# Patient Record
Sex: Female | Born: 1935
Health system: Southern US, Community
[De-identification: ages and names within clinical notes are randomized; demographics above are authoritative.]

## PROBLEM LIST (undated history)

## (undated) DIAGNOSIS — G4733 Obstructive sleep apnea (adult) (pediatric): Secondary | ICD-10-CM

## (undated) DIAGNOSIS — C801 Malignant (primary) neoplasm, unspecified: Secondary | ICD-10-CM

## (undated) DIAGNOSIS — I471 Supraventricular tachycardia, unspecified: Secondary | ICD-10-CM

## (undated) DIAGNOSIS — R0600 Dyspnea, unspecified: Secondary | ICD-10-CM

## (undated) DIAGNOSIS — I719 Aortic aneurysm of unspecified site, without rupture: Secondary | ICD-10-CM

## (undated) DIAGNOSIS — I499 Cardiac arrhythmia, unspecified: Secondary | ICD-10-CM

## (undated) DIAGNOSIS — J8410 Pulmonary fibrosis, unspecified: Secondary | ICD-10-CM

## (undated) DIAGNOSIS — E039 Hypothyroidism, unspecified: Secondary | ICD-10-CM

## (undated) DIAGNOSIS — E079 Disorder of thyroid, unspecified: Secondary | ICD-10-CM

## (undated) DIAGNOSIS — L409 Psoriasis, unspecified: Secondary | ICD-10-CM

## (undated) HISTORY — PX: APPENDECTOMY: SHX54

## (undated) HISTORY — PX: TOTAL ABDOMINAL HYSTERECTOMY: SHX209

## (undated) HISTORY — DX: Dyspnea, unspecified: R06.00

## (undated) HISTORY — DX: Supraventricular tachycardia: I47.1

## (undated) HISTORY — DX: Obstructive sleep apnea (adult) (pediatric): G47.33

## (undated) HISTORY — DX: Cardiac arrhythmia, unspecified: I49.9

## (undated) HISTORY — PX: CRANIOTOMY: SHX93

## (undated) HISTORY — PX: ABDOMINAL HYSTERECTOMY: SHX81

## (undated) HISTORY — PX: CHOLECYSTECTOMY: SHX55

## (undated) HISTORY — DX: Psoriasis, unspecified: L40.9

## (undated) HISTORY — DX: Hypothyroidism, unspecified: E03.9

## (undated) HISTORY — DX: Supraventricular tachycardia, unspecified: I47.10

## (undated) HISTORY — PX: HERNIA REPAIR: SHX51

## (undated) HISTORY — PX: TUBAL LIGATION: SHX77

---

## 1999-06-11 ENCOUNTER — Encounter: Payer: Self-pay | Admitting: Urology

## 1999-06-11 ENCOUNTER — Encounter: Admission: RE | Admit: 1999-06-11 | Discharge: 1999-06-11 | Payer: Self-pay | Admitting: Urology

## 1999-06-14 ENCOUNTER — Other Ambulatory Visit: Admission: RE | Admit: 1999-06-14 | Discharge: 1999-06-14 | Payer: Self-pay | Admitting: *Deleted

## 1999-06-18 ENCOUNTER — Encounter: Payer: Self-pay | Admitting: Urology

## 1999-06-18 ENCOUNTER — Encounter: Admission: RE | Admit: 1999-06-18 | Discharge: 1999-06-18 | Payer: Self-pay | Admitting: Urology

## 1999-08-30 ENCOUNTER — Encounter: Payer: Self-pay | Admitting: *Deleted

## 1999-09-04 ENCOUNTER — Inpatient Hospital Stay (HOSPITAL_COMMUNITY): Admission: RE | Admit: 1999-09-04 | Discharge: 1999-09-08 | Payer: Self-pay | Admitting: *Deleted

## 1999-09-04 ENCOUNTER — Encounter (INDEPENDENT_AMBULATORY_CARE_PROVIDER_SITE_OTHER): Payer: Self-pay

## 1999-09-07 ENCOUNTER — Encounter: Payer: Self-pay | Admitting: *Deleted

## 2002-02-01 ENCOUNTER — Encounter: Payer: Self-pay | Admitting: Internal Medicine

## 2002-02-01 ENCOUNTER — Encounter: Admission: RE | Admit: 2002-02-01 | Discharge: 2002-02-01 | Payer: Self-pay | Admitting: Internal Medicine

## 2003-06-29 ENCOUNTER — Encounter: Admission: RE | Admit: 2003-06-29 | Discharge: 2003-06-29 | Payer: Self-pay | Admitting: Internal Medicine

## 2003-10-24 ENCOUNTER — Ambulatory Visit (HOSPITAL_COMMUNITY): Admission: RE | Admit: 2003-10-24 | Discharge: 2003-10-24 | Payer: Self-pay | Admitting: Internal Medicine

## 2003-11-01 ENCOUNTER — Encounter: Admission: RE | Admit: 2003-11-01 | Discharge: 2003-11-01 | Payer: Self-pay | Admitting: Internal Medicine

## 2005-02-25 ENCOUNTER — Encounter: Admission: RE | Admit: 2005-02-25 | Discharge: 2005-02-25 | Payer: Self-pay | Admitting: Internal Medicine

## 2005-03-30 ENCOUNTER — Emergency Department (HOSPITAL_COMMUNITY): Admission: EM | Admit: 2005-03-30 | Discharge: 2005-03-30 | Payer: Self-pay | Admitting: Emergency Medicine

## 2006-08-15 ENCOUNTER — Inpatient Hospital Stay (HOSPITAL_COMMUNITY): Admission: RE | Admit: 2006-08-15 | Discharge: 2006-08-19 | Payer: Self-pay | Admitting: Surgery

## 2006-09-02 ENCOUNTER — Encounter: Admission: RE | Admit: 2006-09-02 | Discharge: 2006-09-02 | Payer: Self-pay | Admitting: Internal Medicine

## 2007-01-23 ENCOUNTER — Encounter: Admission: RE | Admit: 2007-01-23 | Discharge: 2007-01-23 | Payer: Self-pay | Admitting: Specialist

## 2007-06-26 ENCOUNTER — Ambulatory Visit: Payer: Self-pay | Admitting: Pulmonary Disease

## 2007-06-26 DIAGNOSIS — I499 Cardiac arrhythmia, unspecified: Secondary | ICD-10-CM | POA: Insufficient documentation

## 2007-06-26 DIAGNOSIS — G4733 Obstructive sleep apnea (adult) (pediatric): Secondary | ICD-10-CM | POA: Insufficient documentation

## 2007-06-26 DIAGNOSIS — G4730 Sleep apnea, unspecified: Secondary | ICD-10-CM | POA: Insufficient documentation

## 2007-07-01 DIAGNOSIS — R0602 Shortness of breath: Secondary | ICD-10-CM | POA: Insufficient documentation

## 2007-07-01 DIAGNOSIS — R0902 Hypoxemia: Secondary | ICD-10-CM | POA: Insufficient documentation

## 2007-07-03 ENCOUNTER — Ambulatory Visit: Payer: Self-pay | Admitting: Pulmonary Disease

## 2007-07-09 ENCOUNTER — Ambulatory Visit: Payer: Self-pay | Admitting: Cardiology

## 2007-07-13 ENCOUNTER — Ambulatory Visit: Payer: Self-pay | Admitting: Pulmonary Disease

## 2007-07-13 LAB — CONVERTED CEMR LAB
Rhuematoid fact SerPl-aCnc: <20 intl units/mL — ABNORMAL LOW (ref 0.0–20.0)
Sed Rate: 16 mm/hr (ref 0–22)

## 2007-08-11 ENCOUNTER — Emergency Department (HOSPITAL_COMMUNITY): Admission: EM | Admit: 2007-08-11 | Discharge: 2007-08-11 | Payer: Self-pay | Admitting: Emergency Medicine

## 2007-08-21 ENCOUNTER — Ambulatory Visit: Payer: Self-pay | Admitting: Pulmonary Disease

## 2007-08-21 DIAGNOSIS — J849 Interstitial pulmonary disease, unspecified: Secondary | ICD-10-CM

## 2007-10-27 ENCOUNTER — Encounter: Admission: RE | Admit: 2007-10-27 | Discharge: 2007-10-27 | Payer: Self-pay | Admitting: Internal Medicine

## 2007-11-25 ENCOUNTER — Encounter: Payer: Self-pay | Admitting: Pulmonary Disease

## 2008-02-22 ENCOUNTER — Encounter: Payer: Self-pay | Admitting: Pulmonary Disease

## 2008-02-22 ENCOUNTER — Ambulatory Visit: Payer: Self-pay | Admitting: Pulmonary Disease

## 2008-02-22 DIAGNOSIS — E038 Other specified hypothyroidism: Secondary | ICD-10-CM

## 2008-08-22 ENCOUNTER — Ambulatory Visit: Payer: Self-pay | Admitting: Pulmonary Disease

## 2008-08-25 ENCOUNTER — Ambulatory Visit: Payer: Self-pay | Admitting: Pulmonary Disease

## 2008-09-09 ENCOUNTER — Telehealth (INDEPENDENT_AMBULATORY_CARE_PROVIDER_SITE_OTHER): Payer: Self-pay | Admitting: *Deleted

## 2008-12-16 ENCOUNTER — Encounter: Admission: RE | Admit: 2008-12-16 | Discharge: 2008-12-16 | Payer: Self-pay | Admitting: Internal Medicine

## 2009-02-20 ENCOUNTER — Ambulatory Visit: Payer: Self-pay | Admitting: Pulmonary Disease

## 2009-02-21 ENCOUNTER — Encounter: Payer: Self-pay | Admitting: Pulmonary Disease

## 2009-09-07 ENCOUNTER — Ambulatory Visit: Payer: Self-pay | Admitting: Pulmonary Disease

## 2010-03-06 ENCOUNTER — Ambulatory Visit: Payer: Self-pay | Admitting: Pulmonary Disease

## 2010-05-08 NOTE — Assessment & Plan Note (Signed)
Summary: rov for ISLD   Visit Type:  Follow-up Copy to:  [facility] Primary Nickolus Wadding/Referring Kanen Mottola:  Kellie Shropshire  CC:  6 month f/u. pt states her breathing has been doing "oaky". pt c/o occas cough w/ yellow phlem and sore throat when wakes up in the am. pt quit smoking in 2008. Marland Kitchen  History of Present Illness: the pt comes in today for f/u of her known ISLD, felt to be due to PF vs RBILD.  She has continued to stay away from smoking, and feels that she is maintianing a consistent baseline.  She has minimal cough with occasional mucus, and states that her exertional tolerance is stable.  She is using combivent as needed.  Current Medications (verified): 1)  Ambien 10 Mg  Tabs (Zolpidem Tartrate) .... Take 1 Tab By Mouth At Bedtime 2)  Cartia Xt 180 Mg  Cp24 (Diltiazem Hcl Coated Beads) .... Take 1 Tablet By Mouth Once A Day 3)  Aspirin 325 Mg Tabs (Aspirin) .... Once Daily 4)  Combivent 103-18 Mcg/act  Aero (Ipratropium-Albuterol) .... Use As Needed 5)  Hydrochlorothiazide 25 Mg Tabs (Hydrochlorothiazide) .... Take 1 Tablet By Mouth Once A Day 6)  Synthroid .... Take 1 Tablet By Mouth Once A Day 7)  Janumet 50-500 Mg Tabs (Sitagliptin-Metformin Hcl) .... Once Daily 8)  Flexeril 10 Mg Tabs (Cyclobenzaprine Hcl) .... Every 8 Hours Prn 9)  Vicodin 5-500 Mg Tabs (Hydrocodone-Acetaminophen) .... 1/2 Tab Q6h Prn 10)  Tylenol Extra Strength 500 Mg Tabs (Acetaminophen) .... Take 2 Tabs By Mouth As Needed 11)  Klor-Con M20 20 Meq Cr-Tabs (Potassium Chloride Crys Cr) .... Take 1 Tablet By Mouth Once A Day 12)  Oxygen .... 2 Lpm At Bedtime 13)  Simvastatin (? Strength) .... Once Daily  Allergies (verified): 1)  ! Morphine 2)  ! Codeine 3)  ! Darvocet 4)  ! Tylox 5)  ! Percocet 6)  ! * Dilaudid  Review of Systems       The patient complains of shortness of breath with activity, productive cough, acid heartburn, indigestion, loss of appetite, difficulty swallowing, sore throat, nasal  congestion/difficulty breathing through nose, anxiety, depression, hand/feet swelling, joint stiffness or pain, and change in color of mucus.  The patient denies shortness of breath at rest, non-productive cough, coughing up blood, chest pain, irregular heartbeats, weight change, abdominal pain, tooth/dental problems, headaches, sneezing, itching, ear ache, rash, and fever.    Vital Signs:  Patient profile:   75 year old female Height:      61 inches Weight:      177.50 pounds BMI:     33.66 O2 Sat:      93 % on Room air Temp:     97.9 degrees F oral Pulse rate:   70 / minute BP sitting:   100 / 62  (left arm) Cuff size:   large  Vitals Entered By: Carver Fila (March 06, 2010 11:53 AM)  O2 Flow:  Room air CC: 6 month f/u. pt states her breathing has been doing "oaky". pt c/o occas cough w/ yellow phlem, sore throat when wakes up in the am. pt quit smoking in 2008.  Comments meds and allergies updated Phone number updated Carver Fila  March 06, 2010 11:58 AM    Physical Exam  General:  obese female in nad Lungs:  minimal basilar crackles, no wheezing or rhonchi Heart:  rrr, no mrg Extremities:  mild LE edema, no cyanosis  Neurologic:  alert and oriented, moves all 4.  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  She has stable cxr and pfts, and has quit smoking.  At this point, would like to follow her clinically, and will check yearly pfts/cxr.  The pt will let me know if she has any escalation of breathing issues.    Medications Added to Medication List This Visit: 1)  Aspirin 325 Mg Tabs (Aspirin) .... Once daily 2)  Janumet 50-500 Mg Tabs (Sitagliptin-metformin hcl) .... Once daily 3)  Simvastatin (? Strength)  .... Once daily  Other Orders: Est. Patient Level III (16109) Pulmonary Referral (Pulmonary)  Patient Instructions: 1)  will see  back in 6mos and check cxr and breathing studies before visit. 2)  work on weight loss and conditioning program   Immunization History:  Influenza Immunization History:    Influenza:  historical (11/06/2009)

## 2010-05-08 NOTE — Assessment & Plan Note (Signed)
Summary: rov for isld   Primary Provider/Referring Provider:  Kellie Shropshire  CC:  6 month follow up with CXR and PFTs.  Pt states she occ has trouble breathing when the humidity is high or when it is hot but states breathing is doing well overall.  States she is walking and working out at [employer] seems to be helping also.  States she does have an occ cough.  States it is occ prod with thick light brown mucus.  Denies wheezing and chest tightness.  Marland Kitchen  History of Present Illness: the pt comes in today for f/u of her known interstitial lung disease secondary to either pulmonary fibrosis vs RBILD.  She continues to not smoke, and is tryng to participate in an exercise and weight loss program currently.  She feels that her exertional tolerance is at her usual baseline, and has no signficant cough or mucus.  Her pfts show improvement in TLC and no change in DLCO.  She does not have any significant obstructive disease.  Her cxr shows no change in her known ISLD.  I have reviewed the various tests in detail with her, and answered all of her questions.  Current Medications (verified): 1)  Ambien 10 Mg  Tabs (Zolpidem Tartrate) .... Take 1 Tab By Mouth At Bedtime 2)  Cartia Xt 180 Mg  Cp24 (Diltiazem Hcl Coated Beads) .... Take 1 Tablet By Mouth Once A Day 3)  Low-Dose Aspirin 81 Mg  Tabs (Aspirin) .... Take 1 Tablet By Mouth Once A Day 4)  Combivent 103-18 Mcg/act  Aero (Ipratropium-Albuterol) .... Use As Needed 5)  Hydrochlorothiazide 25 Mg Tabs (Hydrochlorothiazide) .... Take 1 Tablet By Mouth Once A Day 6)  Synthroid .... Take 1 Tablet By Mouth Once A Day 7)  Janumet .... Take 1 Tablet By Mouth Two Times A Day 8)  Flexeril 10 Mg Tabs (Cyclobenzaprine Hcl) .... Every 8 Hours Prn 9)  Vicodin 5-500 Mg Tabs (Hydrocodone-Acetaminophen) .... 1/2 Tab Q6h Prn 10)  Tylenol Extra Strength 500 Mg Tabs (Acetaminophen) .... Take 2 Tabs By Mouth As Needed 11)  Klor-Con M20 20 Meq Cr-Tabs (Potassium Chloride Crys  Cr) .... Take 1 Tablet By Mouth Once A Day 12)  Oxygen .... 2 Lpm At Bedtime  Allergies (verified): 1)  ! Morphine 2)  ! Codeine 3)  ! Darvocet 4)  ! Tylox 5)  ! Percocet 6)  ! * Dilaudid  Review of Systems       The patient complains of shortness of breath with activity, productive cough, non-productive cough, and hand/feet swelling.  The patient denies shortness of breath at rest, coughing up blood, chest pain, irregular heartbeats, acid heartburn, indigestion, loss of appetite, weight change, abdominal pain, difficulty swallowing, sore throat, tooth/dental problems, headaches, nasal congestion/difficulty breathing through nose, sneezing, itching, ear ache, anxiety, depression, joint stiffness or pain, rash, change in color of mucus, and fever.    Vital Signs:  Patient profile:   75 year old female Height:      61 inches Weight:      174 pounds BMI:     33.00 O2 Sat:      92 % on Room air Temp:     97.9 degrees F oral Pulse rate:   85 / minute BP sitting:   108 / 54  (left arm) Cuff size:   regular  Vitals Entered By: Gweneth Dimitri RN (September 07, 2009 1:48 PM)  O2 Flow:  Room air CC: 6 month follow up with CXR  and PFTs.  Pt states she occ has trouble breathing when the humidity is high or when it is hot but states breathing is doing well overall.  States she is walking and working out at [employer] seems to be helping also.  States she does have an occ cough.  States it is occ prod with thick light brown mucus.  Denies wheezing and chest tightness.   Comments Medications reviewed with patient Daytime contact number verified with patient. Gweneth Dimitri RN  September 07, 2009 1:49 PM    Physical Exam  General:  obese female in nad Lungs:  basilar crackles, no wheezing or rhonchi Heart:  rrr, no mrg Extremities:  no signficant edema or cyanosis Neurologic:  alert and oriented, moves all 4.   Impression & Recommendations:  Problem # 1:  FIBROSIS, POSTINFLAMMATORY PULMONARY  (ICD-515) the pt has ISLD/PF of unknown origin.  This may simply be RBILD related to her past smoking, but at least she has now quit.  Her cxr and PFTs remain stable, and that is a good sign.  Weight loss and conditioning are the main goals currently, and she will followup with me consistently for monitoring of her cxr and pfts.  Medications Added to Medication List This Visit: 1)  Hydrochlorothiazide 25 Mg Tabs (Hydrochlorothiazide) .... Take 1 tablet by mouth once a day  Other Orders: Est. Patient Level III (45409)  Patient Instructions: 1)  keep working on weight loss and conditioning program 2)  followup with me in 6mos or sooner if having issues.

## 2010-05-08 NOTE — Miscellaneous (Signed)
Summary: Orders Update-pft charges//jwr  Clinical Lists Changes  Orders: Added new Service order of Carbon Monoxide diffusing w/capacity (94720) - Signed Added new Service order of Lung Volumes (94240) - Signed Added new Service order of Spirometry (Pre & Post) (94060) - Signed 

## 2010-08-21 NOTE — Discharge Summary (Signed)
[name], [name]                 ACCOUNT NO.:  [id number]   MEDICAL RECORD NO.:  [id number]          PATIENT TYPE:  INP   LOCATION:  5703                         FACILITY:  [facility]   PHYSICIAN:  Sandria Bales. Ezzard Standing, M.D.  DATE OF BIRTH:  08-31-1935   DATE OF ADMISSION:  08/15/2006  DATE OF DISCHARGE:  08/19/2006                               DISCHARGE SUMMARY   DISCHARGE DIAGNOSES:  1. Multiple ventral incisional hernias.  2. Right upper lobe pneumonia.  3. Chronic mild hypoxia secondary to chronic obstructive pulmonary      disease. (patient sent home home O2)  4. Chronic obstructive pulmonary disease.  5. History of paroxysmal supraventricular tachycardias possibly      related to her chronic hypoxia.  6. History of smoking cigarettes, she quit two weeks preop.  7. Hypercholesterolemia.  8. Hypertension.   OPERATION PERFORMED:  The patient underwent a laparoscopic enterolysis  of adhesions with laparoscopic ventral hernia repair with Parietex mesh  on 15 Aug 2006.   HISTORY OF PRESENT ILLNESS:  Dawn Gomez is a 75 year old white female who  is a patient of Dawn Gomez who has had multiple prior abdominal  operations.  She had an appendectomy with a right lower quadrant  incision 1962.  She had an open cholecystectomy with an upper midline  incision by Dawn Gomez in 1989.  She had a oophorectomy and later  hysterectomy by Dawn Gomez in 2001 and Dawn Gomez  tried to repair an abdominal hernia at that time.  The patient was under  the impression she had mesh repair, but by my review of the chart, I do  not think he ever used mesh because he was worried about contamination.   She subsequently developed increasingly symptomatic abdominal wall  hernias and comes for repair of these hernias.   SOCIAL HISTORY:  She smokes cigarettes, a pack or more a day.  She quit  about 2 weeks preoperatively.   CARDIAC:  She has had histories of tachy dysrhythmias.  Has been  placed  on beta blocker by Dawn Gomez.  Had cardiac clearance by him seen the  16 July 2006.   She had a left frontal craniotomy by Dawn Gomez, 1990 for clipping 2  intracerebral aneurysms.   PHYSICAL EXAMINATION:  She had multiple abdominal wall hernias on  palpation.   HOSPITAL COURSE:  She was taken to the operating room on the day of  admission.  She underwent a laparoscopic repair of a ventral incisional  hernia.  I spent 2 hours doing enterolysis of adhesions.  She had  multiple abdominal defects, which were repaired with a single piece of  Parietex mesh.   Postoperatively her biggest problem was that of poor saturations.  Off  oxygen she would run sats in the mid to low 80s.  I did get a chest x-  ray that showed a small infiltrate in her right lobe of her lung.  She  has had a little bit of hemoptysis possibly due to this infiltrate, but  she has never been febrile.  She has  never had an elevated white blood  count.  She has had no other signs of infection.   She was put empirically on [place] on the 11 May.  She is now 4 days  postop on the 13 May.  She has been on [place] for 2 days.  She does a  good job with incentive spirometry of over 1,200 mL, but despite what to  her appears to be breathing normal, her sats are running in the mid 80s.   I think she has undiagnosed chronic hypoxemia possibly secondary to COPD  and her cigarettes smoking.  There is some hope that as she gets further  out from cigarette smoking this will improve.  I spoke to Dawn Gomez  on the phone regarding this finding.   She is now ready for discharge.   DISCHARGE MEDICATIONS:  Her discharge medicine will be resuming home  medicine, which include:  1. Benadryl p.r.n. for itching allergies.  2. Vytorin 10/40 nightly.  3. Cartia XT 180 daily.  4. Metoprolol 25 mg daily.  5. Aspirin 81 mg.  6. Tylenol extra strength.  7. Zantac.   PLAN:  I have sent her home on Vicodin for pain.  Gave  her Augmentin for  5 days of antibiotics for a total of 7 days of antibiotics for her lung  infiltrate and we will arrange for her to have home O2.   FOLLOWUP:  She will see me back in 2 weeks.  She is to make arrangements  to see Dawn Gomez as far as followup from her pulmonary standpoint.      Sandria Bales. Ezzard Standing, M.D.  Electronically Signed     DHN/MEDQ  D:  08/19/2006  T:  08/19/2006  Job:  [id number]   cc:   Olene Craven, M.D.  Cristy Hilts. Jacinto Halim, MD

## 2010-08-24 NOTE — Op Note (Signed)
[facility]  Patient:    Dawn Gomez, Dawn Gomez                      MRN: [id number] Proc. Date: 09/04/99 Adm. Date:  [id number] Attending:  Donne Hazel CCWilley Blade, M.D.             Milus Mallick, M.D.             Dr. Adelina Mings                           Operative Report  PREOPERATIVE DIAGNOSIS:  Microhematuria, rule out interstitial cystitis.  POSTOPERATIVE DIAGNOSIS:  Microhematuria.  PROCEDURE:  Cystoscopy and bladder biopsy.  SURGEON:  Lindaann Slough, M.D.  ANESTHESIA:  General.  INDICATIONS:  The patient is a 75 year old female who was found on urinalysis to have microhematuria.  She has also been complaining of suprapubic pain, frequency, and dysuria.  She was treated with antibiotics without any improvement.  An IVP showed normal upper tracts.  The patient also has uterine fibroids and a ventral hernia.  She is scheduled for a hysterectomy by Dr. Malachy Mood and a ventral hernia repair by Dr. Orpah Greek.  She is now scheduled for cystoscopy.  DESCRIPTION OF PROCEDURE:  Under general endotracheal anesthesia, the patient was prepped and draped and placed in the dorsal lithotomy position.  A #22 Wappler cystoscope was inserted in the bladder.  The bladder mucosa is normal. There is no stone or tumor in the bladder.  The ureteral orifices are in normal position and shape, with clear efflux.  There was no evidence of submucosal hemorrhage.  Then a random bladder biopsy was done.  Areas of biopsy were then fulgurated with the Bugbee electrode.  There was no evidence of bleeding at the end of the procedure.  The bladder was then distended with 400 cc of fluid for 10 minutes.  The cystoscope was then removed.  A #16 Foley catheter was then inserted in the bladder.  The patient tolerated the procedure well and left the OR in satisfactory condition to the postanesthesia care unit. DD:  09/04/99 TD:  09/06/99 Job: [id number]

## 2010-08-24 NOTE — Op Note (Signed)
NAMESHERETTA, GRUMBINE                 ACCOUNT NO.:  [id number]   MEDICAL RECORD NO.:  [id number]          PATIENT TYPE:  INP   LOCATION:  2899                         FACILITY:  [facility]   PHYSICIAN:  Sandria Bales. Ezzard Standing, M.D.  DATE OF BIRTH:  11-13-1935   DATE OF PROCEDURE:  08/15/2006  DATE OF DISCHARGE:                               OPERATIVE REPORT   PREOPERATIVE DIAGNOSIS:  Multiple abdominal wall ventral incisional  herniae.   POSTOPERATIVE DIAGNOSES:  Swiss cheese ventral incisional hernia  covering a 12 x 25-cm area (at least 10 defects in the fascia)   PROCEDURES:  Enterolysis of adhesion (spending over 2 hours), and  laparoscopic ventral hernia repair with 30 x 25-cm Parietex mesh.   SURGEON:  Sandria Bales. Ezzard Standing, M.D.   FIRST ASSISTANT:  No first assistant.   ANESTHESIA:  General endotracheal.   ESTIMATED BLOOD LOSS:  Minimal.   INDICATIONS FOR PROCEDURES:  Miss Joerger is a 75 year old white female, a  patient of Dr. Garner Nash, who has had multiple abdominal operations,  including an open cholecystectomy, an oophorectomy and lap-assisted  hysterectomy, a prior abdominal hernia repair by Dr. Orpah Greek without  mesh.   She now comes for attempted laparoscopic ventral hernia repair.  The  indications and potential complications were explained to the patient.  The potential complications include but are not limited to bleeding,  infection, bowel injury, the possibility that I cannot do this  laparoscopically and requiring open surgery, and the possibility of mesh  infection.   OPERATIVE NOTE:  The patient was placed in a supine position and given a  general endotracheal anesthetic.  She had a Foley catheter in place and  was given 1 g of Ancef at the initiation of the procedure.  Her abdomen  was prepped with Betadine solution and sterilely draped, and I put an  Ioban drape over her abdominal wall.  I accessed her abdomen through the  left upper quadrant using an 10 mm Ethicon  Opti-Vu trocar and got into  the peritoneal cavity.  I placed a total of 4 additional trocars, a 5-mm  left midabdomen, a 5-mm left lower quadrant, a 5-mm right lower  quadrant, and a 5-mm right midabdomen.   I spent over 2 hours taking down adhesions from her anterior abdominal  wall, reducing her herniae, which had mainly fat in them.  She did have  a couple loops of small bowel that I took down.  I used probably 95% of  my dissection just using sharp scissors.  I used a little bit of  Harmonic scalpel, as taking down bands, I worried about bleeding.  But I  thought that, at least, when I was working around bowel, I would use  just sharp scissors.   I saw no evidence of any bowel injury, and after I got all the bowel  down, I did take photos of the anterior abdominal wall showing the  multiple herniae defects.   At this point, I measured the defects; and, again, there were probably  more than 10 defects.  They covered an area of 28  cm x 25 cm in length  along her midline incisions.  I marked the edge of these with a spinal  needle, and it looked like there was a piece of Parietex, 30 x 25 cm,  which would cover all these defects well.   So, I used the 30 x 20 Parietex.  I placed 12 sutures of 0 Novafil in a  clockwise fashion on the Parietex, and inserted the Parietex into the  abdominal wall.  I also had 2 central sutures of 2-0 Vicryl suture to  kind of help tent up the mesh.   I then retrieved each of the sutures using an EndoCatch suture grabber.  I then tied the sutures down and tied the 2 middle sutures to help raise  the mesh as sort of a tent.  And then I stapled the edges of the mesh  using approximately 52 staples, both around the edges of the mesh,  trying to space these at 1 to 1.5 cm margins, and then I put a second  row inside about 3 or 4 cm up in the mesh to kind of hold the mesh on  the anterior abdominal wall.  At that time, I spent a good amount of  time  looking around the edges of the mesh to make sure there was no  defect where a loop of bowel could slide around the mesh.  I thought I  had covered the hernia defects well with the mesh.  The mesh appeared to  lie fairly flat.  I did take pictures also of the mesh post-insertion.   At this point, I removed the trocars.  The skin at each site was closed  with a 5-0 Vicryl suture.  I steri-stripped the puncture wounds for the  transabdominal sutures.  The transabdominal sutures I used were 0  Novofil.  She tolerated the procedure well and was transported to the  recovery room in good condition.   Again, I spent over 2 hours doing enterolysis of adhesions, getting  adhesions off the anterior abdominal wall to free up and identify the  hernia defect.  Sponge and needle counts were correct.      Sandria Bales. Ezzard Standing, M.D.  Electronically Signed     DHN/MEDQ  D:  08/15/2006  T:  08/15/2006  Job:  [id number]   cc:   Olene Craven, M.D.  Cristy Hilts. Jacinto Halim, MD

## 2010-08-24 NOTE — Discharge Summary (Signed)
[facility]  Patient:    Dawn Gomez, Dawn Gomez                      MRN: [id number] Adm. Date:  [id number] Disc. Date: [id number] Attending:  Donne Hazel                           Discharge Summary  No dictation DD:  10/17/99 TD:  10/17/99 Job: [id number]

## 2010-08-24 NOTE — Discharge Summary (Signed)
[facility]  Patient:    Dawn Gomez, Dawn Gomez                      MRN: [id number] Adm. Date:  [id number] Disc. Date: [id number] Attending:  Donne Hazel Dictator:   Willey Blade, M.D.                           Discharge Summary  ADMISSION DIAGNOSES: 1.  Pelvic pain. 2.  Known uterine fibroids. 3.  Ventral hernia.  DISCHARGE DIAGNOSES: 1.  Pelvic pain, status post total abdominal hysterectomy, right     salpingo-oophorectomy, ventral hernia repair and cystoscopy - stable. 2.  Probable chronic obstructive pulmonary disease - stable.  HISTORY OF PRESENT ILLNESS:  For complete details of history and physical, please see clinic admission history and physical.  Briefly, the patient is a 75 year old postmenopausal female admitted for total abdominal hysterectomy and right salpingo-oophorectomy.  This is being done for severe pelvic pain unresponsive to conservative medical management.  She also is to undergo repair of ventral hernia and cystoscopy.  Her medical history is significant for a 25 year history of smoking with significant lung impact.  She also has a history of cerebral aneurysms x 2 and a history of kidney stones.  She was thoroughly counseled regarding the risks and benefits of surgery.  MEDICAL HISTORY: 1.  History of kidney stones. 2.  History of smoking. 3.  History of cerebral aneurysm x 2 - repaired. 4.  History of pelvic pain.  SURGICAL HISTORY: 1.  Craniotomy x 2. 2.  1972, cesarean section. 3.  Appendectomy. 4.  Colonoscopy.  OB HISTORY:  1972 cesarean section at term.  CURRENT MEDICATIONS: 1.  Ciprofloxacin. 2.  Darvocet. 3.  Xanax.  ALLERGIES:  Codeine.  PHYSICAL EXAMINATION:  Please see clinic admission history and physical.  HOSPITAL COURSE:  The patient was admitted on the same day of surgery, Sep 04, 1999 where she underwent a total abdominal hysterectomy and right salpingo-oophorectomy.  This was done  through a vertical abdominal incision. Also, at that time, she underwent repair of ventral hernia and cystoscopy. The surgeries went well.  At the time of hysterectomy, a small uterus with uterine fibroids were noted.  Severe pelvic adhesions were encountered. However, the surgery went well.  The patients postoperative course was relatively uneventful.  Her history of smoking did slow down her progress somewhat.  She remained on oxygen postoperatively.  Her incision remained clean, dry and intact throughout her hospitalization.  Her diet was advanced slowly and she was tolerating a regular diet prior to discharge.  She was ambulating and quickly resumed normal bowel and bladder function.  Her hemoglobin stabilized at 11.8.  She did have some atelectasis and a mild fever from this, but this responded to conservative management.  She was routinely discharged on September 08, 1999 in stable condition.  She was tolerating a regular diet, ambulating and oxygenating well without oxygen.  This was evaluated by pulse oximeter.  The patient did well and was routinely discharged in stable condition.  PLAN: 1.  Home. 2.  Routine postoperative instructions given. 3.  Dilaudid, #40, for discomfort. 4.  No heavy lifting or driving a car for two weeks. 5.  Nothing per vagina x 6 weeks. 6.  The patient will follow-up in the office in one week to have staples     removed. DD:  10/09/99 TD:  10/09/99 Job: [id number]  [id number]

## 2010-08-24 NOTE — Op Note (Signed)
[facility]  Patient:    Dawn Gomez, Dawn Gomez                      MRN: [id number] Proc. Date: 09/04/99 Adm. Date:  [id number] Attending:  Donne Hazel CC:         Willey Blade, M.D.             Lindaann Slough, M.D.                           Operative Report  PREOPERATIVE DIAGNOSIS:       Chronically incarcerated ventral incisional hernia.  POSTOPERATIVE DIAGNOSIS:      Chronically incarcerated ventral incisional hernia.  PROCEDURE:                    Repair of ventral incisional hernia.  SURGEON:                      Milus Mallick, M.D.  INDICATIONS:                  This 75 year old female was known to have a chronically incarcerated ventral incisional hernia that was adjacent to the umbilicus on the right side.  It was approximately the size of a lemon.  DESCRIPTION OF PROCEDURE:     The patient was undergoing an abdominal hysterectomy today by R. Alan Mulder, M.D.  When he had finished his part of the procedure through a midline hypogastric incision, he closed the abdomen to halfway between the pubis and the umbilicus with interrupted sutures of #1 Novafil.  I then joined the operative procedure.  At that time, he had also removed the incarcerated tissue from the hernia sac which was most likely omentum because there was a big wad of omentum just under the hernia.  The incision was lengthened superiorly to the upper end of the umbilicus.  The umbilical skin was dissected off of the fascia revealing a small umbilical hernia.  The defect in the fascia of the chronically incarcerated hernia was approximately 3 cm in diameter.  The fascia surrounding the hernia was cleared of subcutaneous tissue and the hernia sac was excised.  Since the patient had had some possible contamination in her operation due to transection of the upper end of the vagina, it was thought to be prudent not to introduce mesh into the procedure and instead  the hernia was repaired with interrupted sutures of #1 Novafil.  The remainder of the abdomen was closed down to the closure done by R. Alan Mulder, M.D. with interrupted through and through sutures of #1 Novafil exclusive of skin.  The skin was closed with generic skin stapler 35-W and a sterile dressing was applied.  Estimated blood loss for the procedure was negligible.  The patient was continued on general anesthesia in order for Lindaann Slough, M.D. to perform cystoscopy. DD:  09/04/99 TD:  09/05/99 Job: [id number]

## 2010-08-24 NOTE — H&P (Signed)
[facility]  Patient:    Dawn Gomez, Dawn Gomez                      MRN: [id number] Adm. Date:  [id number] Attending:  Donne Hazel                         History and Physical  HISTORY OF PRESENT ILLNESS:  Ms. Kassin is a 75 year old postmenopausal female admitted for total abdominal hysterectomy and right salpingo-oophorectomy. The patient has had some severe pelvic pain for the past about 4 months. This pain has been mysterious somewhat. It has been quite severe, she is known to have a ventral hernia. She is admitted at this time for total abdominal hysterectomy and right salpingo-oophorectomy. She underwent an ultrasound recently which showed multiple uterine fibroids. Also uterine enlargement is noted. After discussion with the patient, she has elected to proceed with a surgical approach to this pain. She is also to have herniorrhaphy and cystoscopy during this procedure.  Of importance, the patient has had no postmenopausal bleeding. Her Pap smear recently was normal. She has been counseled thoroughly that this hysterectomy might not be the pain relief she is looking for. Questions were answered and thorough consultation was given regarding this.  PAST MEDICAL HISTORY: 1. History of kidney stones. 2. History of smoking x 25 years. 3. History of cerebral aneurysm x 2. 4. History of pelvic pain.  PAST SURGICAL HISTORY: 1. Craniotomy x 2 for aneurysms--repaired. 2. In 1992 a cesarean section. 3. Appendectomy. 4. Colonoscopy.  OB HISTORY:  In 1972 a primary cesarean section at term, a female weighing 8 pounds.  CURRENT MEDICATIONS:  Ciprofloxacin, Darvocet, and Xanax.  ALLERGIES:  CODEINE.  PHYSICAL EXAMINATION:  VITAL SIGNS:  Stable, temperature 97.3, pulse 104, respirations 16, blood pressure 120/78.  GENERAL:  She is a well-developed, well-nourished female in no acute distress.  HEENT:  Really unremarkable. There is mild redness of the  face. No worrisome signs noted.  NECK:  Supple without adenopathy or thyromegaly.  HEART:  Regular rate and rhythm without murmur, gallop or rub.  LUNGS:  Clear with decreased breath sounds throughout. No adventitial sounds heard.  ABDOMEN:  Mildly overweight. A ventral hernia is noted. Also mild tenderness in the lower quadrants is identified without peritoneal signs.  EXTREMITIES:  Neurologically grossly normal.  PELVIC:  Normal external female genitalia, vagina with noted mild atrophy. Vagina and cervix without suspicious lesions. The uterus is about 6 weeks in size. The adnexa is mildly tender bilaterally without mass.  ADMITTING DIAGNOSES: 1. Pelvic pain. 2. Known uterine fibroids. 3. Ventral hernia. 4. Pelvic pain.  PLAN: 1. Total abdominal hysterectomy with right salpingo-oophorectomy. 2. To follow herniorrhaphy and cystoscopy.  DISCUSSION:  Risks and benefits of surgery explained to patient. The risk of bleeding, infection, and the risk of injury to surrounding organs was discussed. The patient was allowed ask questions and wished to proceed. She was thoroughly counseled regarding this surgery.DD:  09/04/99 TD:  09/04/99 Job: [id number]

## 2010-08-24 NOTE — Op Note (Signed)
[facility]  Patient:    Dawn Gomez, Dawn Gomez                      MRN: [id number] Proc. Date: 09/04/99 Adm. Date:  [id number] Attending:  Donne Hazel                           Operative Report  PREOPERATIVE DIAGNOSIS:       Pelvic pain.  Uterine fibroids - symptomatic.  POSTOPERATIVE DIAGNOSIS:      Pelvic pain.  Uterine fibroids - symptomatic. Severe pelvic adhesions.  OPERATION:                    Total abdominal hysterectomy.  Right salpingo-oophorectomy.  Lysis of adhesions.  SURGEON:                      Willey Blade, M.D.  ASSISTANT:                    Guy Sandifer. Arleta Creek, M.D.  ANESTHESIA:                   General endotracheal anesthesia.  ESTIMATED BLOOD LOSS:         100 cc.  FINDINGS:                     At time of surgery, a small uterus was encountered. There were noted posterior uterine fibroids, however.  The right ovary was atrophic. The left ovary was previously removed.  There were a great deal of pelvic adhesions requiring lysis of adhesions extensively.  No other pathology encountered.  The ventral hernia was evident upon entry into the intra-abdominal cavity.  DESCRIPTION OF PROCEDURE:     The patient was taken to the operating room where a general endotracheal anesthesia was administered.  The patient was placed on the operating table in the supine position.  The abdomen, perineum, and vagina was prepped and draped in the usual sterile fashion with Betadine and sterile drapes thoroughly.  A Foley catheter was inserted.  The abdomen was then entered through a vertical incision extending from the suprapubic region to just below the umbilicus. This was carried down sharply in the usual fashion.  The peritoneum was atraumatically entered.  The hernia was noted.  A self retaining retractor was hen placed, the protractor.  The bowel was packed away with laparotomy packs.  The uterus was grasped with a thyroid  tenaculum and elevated into the incision. First the right ovary was removed.  The round ligament was grasped and ligated with a  transfixing suture of 0 Vicryl.  The retroperitoneal space was entered and the ureter identified.  The infundibulopelvic ligament was then clamped with a curved Heaney clamp, the ovary dissected free sharply.  The infundibulopelvic ligament was then doubly ligated with a 0 Vicryl free tie and a transfixing 0 Vicryl suture.  The bladder flap was created sharply over the lower uterine segment and the bladder was dissected inferiorly and well out of the area of dissection.  Next, the uterine arteries were skeletonized bilaterally.  The uterine artery pedicles were then clamped with curved Heaney clamps.  The vascular pedicle was sharply created and suture ligated with 0 Vicryl suture.  Successive bites were then carried down the body of the uterus hugging the uterus very closely with straight Heaney clamps.  All  vascular pedicles were sharply created and then suture ligated with 0 Vicryl suture.  The vagina was eventually met and the uterus and cervix dissected free. The vaginal angles were then closed with a transfixing suture of 0 Vicryl.  The  vaginal cuff was then closed with three interrupted figure-of-eight sutures of -0 Vicryl as well.  The pelvis was then thoroughly irrigated with copious amounts f irrigant and noted to be hemostatic.  As mentioned, lysis of adhesions was carried out which was quite extensive.  The uterosacral ligaments were then plicated in the midline with a stitch of 0 Vicryl.  The cul-de-sac was thus obliterated.  The pelvis was thoroughly irrigated as mentioned.  Good hemostasis was noted. Attention was then turned to closure.  All packs were removed and the self-retaining retractor.  The fascia was then closed halfway from the suprapubic region to about half the incision.  This was done with a running stitch of  #1 Novafil.  The skin and remainder of the incision remained open to follow herniorrhaphy or repair of ventral hernia.  Next, the subcutaneous tissue was irrigated and made hemostatic using the Bovie cautery.  A wet towel was then placed over the operative field for Milus Mallick, M.D. to then proceed with repair of ventral hernia.  Final sponge, needle, and instrument counts were correct x 3 when we left. Bleeding was 100 cc.  There were no perioperative complications and this part of the procedure went very well.  The patient did receive a preoperative antibiotic. DD:  09/05/99 TD:  09/05/99 Job: [id number]

## 2010-08-27 ENCOUNTER — Encounter: Payer: Self-pay | Admitting: Pulmonary Disease

## 2010-09-04 ENCOUNTER — Ambulatory Visit: Payer: Self-pay | Admitting: Pulmonary Disease

## 2010-09-25 ENCOUNTER — Ambulatory Visit: Payer: Self-pay | Admitting: Pulmonary Disease

## 2010-10-15 ENCOUNTER — Encounter: Payer: Self-pay | Admitting: Pulmonary Disease

## 2010-10-16 ENCOUNTER — Ambulatory Visit (INDEPENDENT_AMBULATORY_CARE_PROVIDER_SITE_OTHER): Payer: Medicare Other | Admitting: Pulmonary Disease

## 2010-10-16 ENCOUNTER — Encounter: Payer: Self-pay | Admitting: Pulmonary Disease

## 2010-10-16 ENCOUNTER — Ambulatory Visit (INDEPENDENT_AMBULATORY_CARE_PROVIDER_SITE_OTHER)
Admission: RE | Admit: 2010-10-16 | Discharge: 2010-10-16 | Disposition: A | Discharge status: Home / Self Care | Payer: Medicare Other | Source: Ambulatory Visit | Attending: Pulmonary Disease | Admitting: Pulmonary Disease

## 2010-10-16 VITALS — BP 106/68 | HR 71 | Temp 97.5°F | Ht 61.0 in | Wt 174.0 lb

## 2010-10-16 DIAGNOSIS — J8410 Pulmonary fibrosis, unspecified: Secondary | ICD-10-CM

## 2010-10-16 DIAGNOSIS — R0602 Shortness of breath: Secondary | ICD-10-CM

## 2010-10-16 LAB — PULMONARY FUNCTION TEST

## 2010-10-16 NOTE — Progress Notes (Signed)
  Subjective:    Patient ID: Dawn Gomez, female    DOB: April 18, 1935, 75 y.o.   MRN: [id number]  HPI The pt comes in today for f/u of her known ISLD.  Her f/u cxr today shows no significant change, but her pfts do show a decline in her TLC, and a minimal decrease in her DLCO.  She has not really seen a big change in her breathing, no significant cough or congestion.  Her weight is stable from the last visit.  The heat has been an issue for her, but she is staying inside.   Review of Systems  Constitutional: Negative for fever and unexpected weight change.  HENT: Positive for trouble swallowing, postnasal drip and sinus pressure. Negative for ear pain, nosebleeds, congestion, sore throat, rhinorrhea, sneezing and dental problem.   Eyes: Positive for itching. Negative for redness.  Respiratory: Positive for chest tightness and shortness of breath. Negative for cough and wheezing.   Cardiovascular: Positive for leg swelling. Negative for palpitations.  Gastrointestinal: Negative for nausea and vomiting.  Genitourinary: Negative for dysuria.  Musculoskeletal: Negative for joint swelling.  Skin: Negative for rash.  Neurological: Positive for headaches.  Hematological: Does not bruise/bleed easily.  Psychiatric/Behavioral: Negative for dysphoric mood. The patient is not nervous/anxious.        Objective:   Physical Exam Obese female in nad Chest with no crackles or wheezing Cor with rrr LE with mild edema, no cyanosis Alert, oriented, moves all 4        Assessment & Plan:

## 2010-10-16 NOTE — Assessment & Plan Note (Signed)
The pt does have a decline in her TLC from last year, but feels her breathing subjectively is without change.  It is unclear if this is a "blip on the radar screen" or whether it is a trend.  Since she is stable in her day to day activities, I would like to recheck again in one year, but would check earlier if increased symptoms.  I have encouraged her to work on weight loss and some type of conditioning program.

## 2010-10-16 NOTE — Progress Notes (Signed)
PFT done today. 

## 2010-10-16 NOTE — Patient Instructions (Signed)
Will see you back in 6mos, but please call if having worsening breathing issues once the hot weather has passed. Work on weight loss and conditioning.

## 2010-11-01 ENCOUNTER — Encounter: Payer: Self-pay | Admitting: Pulmonary Disease

## 2011-04-12 ENCOUNTER — Telehealth: Payer: Self-pay | Admitting: Pulmonary Disease

## 2011-04-12 NOTE — Telephone Encounter (Signed)
I spoke with pt and she states it was her understanding that she was suppose to get scheduled for a PFT on return w/ OV. I do not see this in last OV note. Pt states she has one done every 6 months. Please advise Dr. Shelle Iron, thanks

## 2011-04-15 NOTE — Telephone Encounter (Signed)
lmomtcb x1 for pt 

## 2011-04-15 NOTE — Telephone Encounter (Signed)
I see her back every 6mos, but do pfts only yearly.  See tab under procedures if pt is not satisfied and read off dates of prior pfts.

## 2011-04-16 ENCOUNTER — Ambulatory Visit: Payer: Medicare Other | Admitting: Pulmonary Disease

## 2011-04-16 NOTE — Telephone Encounter (Signed)
lmomtcb  

## 2011-04-17 NOTE — Telephone Encounter (Signed)
LMTCB

## 2011-04-17 NOTE — Telephone Encounter (Signed)
I informed pt of KC's findings and recommendations. Pt verbalized understanding.  

## 2011-05-09 ENCOUNTER — Ambulatory Visit: Payer: Medicare Other | Admitting: Pulmonary Disease

## 2011-05-27 ENCOUNTER — Ambulatory Visit (INDEPENDENT_AMBULATORY_CARE_PROVIDER_SITE_OTHER): Payer: Medicare Other | Admitting: Pulmonary Disease

## 2011-05-27 ENCOUNTER — Encounter: Payer: Self-pay | Admitting: Pulmonary Disease

## 2011-05-27 VITALS — BP 120/82 | HR 72 | Temp 97.5°F | Ht 61.0 in | Wt 180.4 lb

## 2011-05-27 DIAGNOSIS — J849 Interstitial pulmonary disease, unspecified: Secondary | ICD-10-CM

## 2011-05-27 DIAGNOSIS — J8410 Pulmonary fibrosis, unspecified: Secondary | ICD-10-CM

## 2011-05-27 NOTE — Progress Notes (Signed)
  Subjective:    Patient ID: Dawn Gomez, female    DOB: 29-Oct-1935, 76 y.o.   MRN: [id number]  HPI Patient comes in today for followup of her known interstitial lung disease.  This is felt to be secondary to RB ILD versus IPF?  We have been following the patient closely, and she has not had any radiographic or PFT worsening.  She comes in today where she feels that her exertional tolerance is at her normal baseline.  She denies any cough or congestion.   Review of Systems  Constitutional: Negative for fever and unexpected weight change.  HENT: Negative for ear pain, nosebleeds, congestion, sore throat, rhinorrhea, sneezing, trouble swallowing, dental problem, postnasal drip and sinus pressure.   Eyes: Negative for redness and itching.  Respiratory: Positive for cough and shortness of breath. Negative for chest tightness and wheezing.   Cardiovascular: Negative for palpitations and leg swelling.  Gastrointestinal: Negative for nausea and vomiting.  Genitourinary: Negative for dysuria.  Musculoskeletal: Negative for joint swelling.  Skin: Negative for rash.  Neurological: Negative for headaches.  Hematological: Does not bruise/bleed easily.  Psychiatric/Behavioral: Negative for dysphoric mood. The patient is not nervous/anxious.        Objective:   Physical Exam Obese female in no acute distress Nose without purulence or discharge noted Chest with mild basilar crackles, otherwise clear Cardiac exam with regular rate and rhythm Lower extremities with minimal ankle edema, no cyanosis Alert and oriented, moves all 4 extremities.       Assessment & Plan:

## 2011-05-27 NOTE — Patient Instructions (Signed)
Work on weight reduction and some type of exercise program Will see you back in 6 mos, and will check breathing tests and chest xray the same day. Please call if having issues before then.

## 2011-05-27 NOTE — Assessment & Plan Note (Signed)
The patient appears to be stable from a clinical standpoint.  I have encouraged her to work aggressively on weight loss and some type of conditioning program.  She will need her followup chest x-ray and pulmonary function studies at her next six-month checkup.  However, I have asked her to call us should she have worsening of her symptoms in the interim.

## 2011-11-22 ENCOUNTER — Other Ambulatory Visit: Payer: Self-pay | Admitting: Pulmonary Disease

## 2011-11-22 DIAGNOSIS — J849 Interstitial pulmonary disease, unspecified: Secondary | ICD-10-CM

## 2011-11-25 ENCOUNTER — Ambulatory Visit (INDEPENDENT_AMBULATORY_CARE_PROVIDER_SITE_OTHER): Payer: Medicare Other | Admitting: Pulmonary Disease

## 2011-11-25 ENCOUNTER — Encounter: Payer: Self-pay | Admitting: Pulmonary Disease

## 2011-11-25 ENCOUNTER — Ambulatory Visit (INDEPENDENT_AMBULATORY_CARE_PROVIDER_SITE_OTHER)
Admission: RE | Admit: 2011-11-25 | Discharge: 2011-11-25 | Disposition: A | Discharge status: Home / Self Care | Payer: Medicare Other | Source: Ambulatory Visit | Attending: Pulmonary Disease | Admitting: Pulmonary Disease

## 2011-11-25 VITALS — BP 118/72 | HR 58 | Temp 98.5°F | Ht 61.0 in | Wt 183.0 lb

## 2011-11-25 DIAGNOSIS — J8410 Pulmonary fibrosis, unspecified: Secondary | ICD-10-CM

## 2011-11-25 DIAGNOSIS — J849 Interstitial pulmonary disease, unspecified: Secondary | ICD-10-CM

## 2011-11-25 LAB — PULMONARY FUNCTION TEST

## 2011-11-25 NOTE — Progress Notes (Signed)
  Subjective:    Patient ID: Dawn Gomez, female    DOB: Sep 23, 1935, 76 y.o.   MRN: [id number]  HPI Patient comes in today for followup of her known interstitial lung disease, felt secondary to RBILD vs IPF?  She feels that her breathing has been stable since the last visit, but continues to have chronic dyspnea on exertion.  She has very little cough or mucus production.  Of note, she is not working on any type of exercise program currently.  She has had a followup chest x-ray today which shows stable interstitial changes.  She has had pulmonary function studies that are completely stable from a year ago.   Review of Systems  Constitutional: Positive for fatigue. Negative for fever and unexpected weight change.  HENT: Positive for rhinorrhea. Negative for ear pain, nosebleeds, congestion, sore throat, sneezing, trouble swallowing, dental problem, postnasal drip and sinus pressure.   Eyes: Negative for redness and itching.  Respiratory: Positive for cough and shortness of breath. Negative for chest tightness and wheezing.   Cardiovascular: Negative for palpitations and leg swelling.  Gastrointestinal: Negative for nausea and vomiting.  Genitourinary: Negative for dysuria.  Musculoskeletal: Negative for joint swelling.  Skin: Negative for rash.  Neurological: Positive for headaches.  Hematological: Does not bruise/bleed easily.  Psychiatric/Behavioral: Negative for dysphoric mood. The patient is not nervous/anxious.   All other systems reviewed and are negative.       Objective:   Physical Exam Obese female in no acute distress Nose without purulence or discharge noted Chest with minimal basilar crackles, no wheezing Cardiac exam with regular rate and rhythm Lower extremities with mild edema, no cyanosis alert and oriented, moves all 4 extremities.       Assessment & Plan:

## 2011-11-25 NOTE — Assessment & Plan Note (Signed)
The patient has underlying interstitial disease of unknown origin, but her chest x-ray and PFTs are completely stable from a year ago.  I suspect that she may have respiratory bronchiolitis with interstitial lung disease, and she has quit smoking.  I cannot totally exclude the possibility of IPF, but her objective testing is totally stable.  She continues to have chronic dyspnea on exertion at baseline, and I have stressed to her this is unlikely to improve until she can lose weight and improve her conditioning.  I have offered to refer her to the pulmonary rehabilitation program, and she is willing to participate.

## 2011-11-25 NOTE — Patient Instructions (Addendum)
Will refer you to pulmonary rehab program at [place].  It may take a few weeks for them to call.  If you do not hear from them, call me back. Work on weight loss and conditioning.  followup with me in 6mos

## 2011-11-25 NOTE — Progress Notes (Signed)
PFT done today. 

## 2011-12-03 ENCOUNTER — Encounter: Payer: Self-pay | Admitting: Pulmonary Disease

## 2012-01-30 ENCOUNTER — Telehealth (HOSPITAL_COMMUNITY): Payer: Self-pay | Admitting: *Deleted

## 2012-01-30 NOTE — Telephone Encounter (Signed)
Attempt made to contact Dawn Gomez regarding Pulmonary Rehab on 01/28/12.  Telephone recording, not accepting calls at this time.  Letter mailed to patient to call Pulmonary Rehab regarding MD referral

## 2012-05-27 ENCOUNTER — Ambulatory Visit: Payer: Medicare Other | Admitting: Pulmonary Disease

## 2012-06-10 ENCOUNTER — Ambulatory Visit (INDEPENDENT_AMBULATORY_CARE_PROVIDER_SITE_OTHER): Payer: Medicare Other | Admitting: Pulmonary Disease

## 2012-06-10 ENCOUNTER — Ambulatory Visit (INDEPENDENT_AMBULATORY_CARE_PROVIDER_SITE_OTHER)
Admission: RE | Admit: 2012-06-10 | Discharge: 2012-06-10 | Disposition: A | Discharge status: Home / Self Care | Payer: Medicare Other | Source: Ambulatory Visit | Attending: Pulmonary Disease | Admitting: Pulmonary Disease

## 2012-06-10 ENCOUNTER — Encounter: Payer: Self-pay | Admitting: Pulmonary Disease

## 2012-06-10 VITALS — BP 102/70 | HR 68 | Temp 97.8°F | Ht 61.0 in | Wt 185.0 lb

## 2012-06-10 DIAGNOSIS — J8410 Pulmonary fibrosis, unspecified: Secondary | ICD-10-CM

## 2012-06-10 NOTE — Assessment & Plan Note (Signed)
The patient is noting worsening dyspnea on exertion since her last visit, however her exam is unchanged from the last visit.  Her weight is up a few pounds, but her lower extremity edema appears similar to last visit.  She does have significant desaturation with exercise today, and will benefit from exertional oxygen.  Will also need to check a chest x-ray to make sure this does not represent some type of flare of her interstitial lung disease.  Finally, the patient is complaining of some atypical chest pain with radiation into her neck.  It is unclear whether this could represent an anginal equivalent.  It primarily occurs during sleep and not with exertion.  I would like to check an echocardiogram to evaluate her cardiac status, but also to see if she is developing pulmonary hypertension.  I have stressed to her the importance of weight reduction and some type of conditioning program, and the patient is clearly working on this.

## 2012-06-10 NOTE — Patient Instructions (Addendum)
Will start you on oxygen with exertional activities, but do not need at rest.  Will also check your oxygen level overnight on cpap. Will check a xray today for completeness since you are having increased symptoms. Will schedule for sound wave test of your heart, and may recommend to Dr. Renae Gloss that she consider a stress test. Continue to work on weight loss. Please call me in 2 weeks with how things are going.  Will also schedule for followup in 6mos.

## 2012-06-10 NOTE — Progress Notes (Signed)
  Subjective:    Patient ID: Dawn Gomez, female    DOB: Oct 05, 1935, 77 y.o.   MRN: [id number]  HPI The patient comes in today for followup of her interstitial lung disease.  She feels that her breathing has worsened since the last visit, but denies any increase in cough.  She is still not heard from pulmonary rehabilitation despite the referral, and her weight is up a few pounds from the last visit.  She does note an atypical like chest discomfort in her right upper chest that extends into her jaw.  This occurs primarily at night while sleeping, but has not seen it with exertion.  She does have chronic lower extremity edema, and feels that it is no worse.   Review of Systems  Constitutional: Negative for fever and unexpected weight change.  HENT: Positive for trouble swallowing and postnasal drip. Negative for ear pain, nosebleeds, congestion, sore throat, rhinorrhea, sneezing, dental problem and sinus pressure.   Eyes: Negative for redness and itching.  Respiratory: Positive for shortness of breath. Negative for cough, chest tightness and wheezing.   Cardiovascular: Negative for palpitations and leg swelling.  Gastrointestinal: Negative for nausea and vomiting.  Genitourinary: Negative for dysuria.  Musculoskeletal: Negative for joint swelling.  Skin: Negative for rash.  Neurological: Negative for headaches.  Hematological: Does not bruise/bleed easily.  Psychiatric/Behavioral: Negative for dysphoric mood. The patient is not nervous/anxious.        Objective:   Physical Exam Morbidly obese female in no acute distress Nose without purulence or discharge noted Oropharynx clear Neck without lymphadenopathy or thyromegaly Chest with crackles one third the way up bilaterally, no wheezing Cardiac exam with regular rate and rhythm Lower extremities with 1+ edema bilaterally, no cyanosis Alert and oriented, moves all 4 extremities.       Assessment & Plan:

## 2012-06-10 NOTE — Addendum Note (Signed)
Addended by: Darrell Jewel on: 06/10/2012 01:26 PM   Modules accepted: Orders

## 2012-06-18 ENCOUNTER — Other Ambulatory Visit (HOSPITAL_COMMUNITY): Payer: Medicare Other

## 2012-07-06 ENCOUNTER — Ambulatory Visit (HOSPITAL_COMMUNITY): Payer: Medicare Other | Attending: Cardiology | Admitting: Radiology

## 2012-07-06 DIAGNOSIS — G4733 Obstructive sleep apnea (adult) (pediatric): Secondary | ICD-10-CM | POA: Insufficient documentation

## 2012-07-06 DIAGNOSIS — R0989 Other specified symptoms and signs involving the circulatory and respiratory systems: Secondary | ICD-10-CM | POA: Insufficient documentation

## 2012-07-06 DIAGNOSIS — E669 Obesity, unspecified: Secondary | ICD-10-CM | POA: Insufficient documentation

## 2012-07-06 DIAGNOSIS — R0609 Other forms of dyspnea: Secondary | ICD-10-CM | POA: Insufficient documentation

## 2012-07-06 DIAGNOSIS — J849 Interstitial pulmonary disease, unspecified: Secondary | ICD-10-CM

## 2012-07-06 DIAGNOSIS — R0602 Shortness of breath: Secondary | ICD-10-CM

## 2012-07-06 NOTE — Progress Notes (Signed)
Echocardiogram performed.  

## 2012-08-02 ENCOUNTER — Encounter: Payer: Self-pay | Admitting: Pulmonary Disease

## 2012-08-02 ENCOUNTER — Telehealth: Payer: Self-pay | Admitting: Pulmonary Disease

## 2012-08-02 NOTE — Telephone Encounter (Signed)
Dawn Gomez, please let her know that her ONO on cpap looks ok.  Doesn't need oxygen as long as she wears cpap

## 2012-08-03 NOTE — Telephone Encounter (Signed)
Pt aware of results per [facility].

## 2012-08-19 ENCOUNTER — Telehealth (HOSPITAL_COMMUNITY): Payer: Self-pay | Admitting: *Deleted

## 2012-08-19 NOTE — Telephone Encounter (Signed)
Patient called regarding Pulm rehab, no answer and answering machine not available.  Letter sent.  Cathie Olden RN

## 2012-08-26 ENCOUNTER — Telehealth (HOSPITAL_COMMUNITY): Payer: Self-pay | Admitting: *Deleted

## 2012-08-26 NOTE — Telephone Encounter (Signed)
Call placed to patient regarding Pulmonary Rehab.  She does want to attend, she will have a large co-pay so we are attempting to get financial assistance.  Application and instructions mailed to patient.  Cathie Olden RN

## 2012-11-05 ENCOUNTER — Other Ambulatory Visit: Payer: Self-pay | Admitting: Internal Medicine

## 2012-11-05 DIAGNOSIS — Z1231 Encounter for screening mammogram for malignant neoplasm of breast: Secondary | ICD-10-CM

## 2012-12-15 ENCOUNTER — Ambulatory Visit: Payer: Medicare Other | Admitting: Pulmonary Disease

## 2013-01-08 ENCOUNTER — Ambulatory Visit: Payer: Medicare Other | Admitting: Pulmonary Disease

## 2013-02-08 ENCOUNTER — Ambulatory Visit (INDEPENDENT_AMBULATORY_CARE_PROVIDER_SITE_OTHER): Payer: Medicare Other | Admitting: Pulmonary Disease

## 2013-02-08 ENCOUNTER — Encounter: Payer: Self-pay | Admitting: Pulmonary Disease

## 2013-02-08 VITALS — BP 102/64 | HR 84 | Temp 97.5°F | Ht 61.0 in | Wt 191.4 lb

## 2013-02-08 DIAGNOSIS — J849 Interstitial pulmonary disease, unspecified: Secondary | ICD-10-CM

## 2013-02-08 DIAGNOSIS — J8410 Pulmonary fibrosis, unspecified: Secondary | ICD-10-CM

## 2013-02-08 DIAGNOSIS — Z23 Encounter for immunization: Secondary | ICD-10-CM

## 2013-02-08 NOTE — Assessment & Plan Note (Addendum)
The patient has interstitial lung disease that has been stable over the last few years by chest radiograph and PFTs, and was initially felt to be secondary to RB ILD.  The patient has subsequently stopped smoking, and again has been stable over the last few years.  She now has worsening shortness of breath over the last 6 months, and it is unclear whether this is related to her lung disease, weight, or possibly underlying coronary disease.  She has had a recent stress test, with the results pending.  Because of increased symptoms, I would like to reevaluate with a high-resolution CT chest, and we'll also schedule her for the usual yearly PFTs followup.  Her work aggressively on weight loss.

## 2013-02-08 NOTE — Patient Instructions (Signed)
Will schedule for breathing studies and ct scan of your chest.  I will call you to discuss results. Work on weight loss as able Will give you a flu shot today. Will arrange followup once I get results back.

## 2013-02-08 NOTE — Progress Notes (Signed)
  Subjective:    Patient ID: Dawn Gomez, female    DOB: 20-Jun-1935, 77 y.o.   MRN: [id number]  HPI The patient comes in today for followup of her interstitial lung disease, most likely secondary to IPF.  She has had stable radiographs and pulmonary functions over the last few years, but today complains of increased shortness of breath over the last 6 months.  She denies any dry hacking cough, and has not had any pleuritic chest pain.  She has had a recent stress test with cardiology, with the results pending.  She has had an echocardiogram that showed a normal ejection fraction, evidence for diastolic dysfunction, a normal right ventricle, and no evidence for pulmonary hypertension.   Review of Systems  Constitutional: Negative for fever and unexpected weight change.  HENT: Positive for postnasal drip, rhinorrhea and sneezing. Negative for congestion, dental problem, ear pain, nosebleeds, sinus pressure, sore throat and trouble swallowing.   Eyes: Negative for redness and itching.  Respiratory: Positive for cough and shortness of breath. Negative for chest tightness and wheezing.   Cardiovascular: Positive for palpitations and leg swelling.  Gastrointestinal: Negative for nausea and vomiting.  Genitourinary: Negative for dysuria.  Musculoskeletal: Positive for joint swelling ( hand swelling).  Skin: Negative for rash.  Neurological: Negative for headaches.  Hematological: Does not bruise/bleed easily.  Psychiatric/Behavioral: Negative for dysphoric mood. The patient is not nervous/anxious.        Objective:   Physical Exam Obese female in nad Nose without purulence or d/c noted. Neck without LN or TMG Chest with crackles 1/3 up bilat without wheezing Cor with rrr LE with no significant edema, no cyanosis Alert and oriented, moves all 4.        Assessment & Plan:

## 2013-02-12 ENCOUNTER — Ambulatory Visit (INDEPENDENT_AMBULATORY_CARE_PROVIDER_SITE_OTHER)
Admission: RE | Admit: 2013-02-12 | Discharge: 2013-02-12 | Disposition: A | Discharge status: Home / Self Care | Payer: Medicare Other | Source: Ambulatory Visit | Attending: Pulmonary Disease | Admitting: Pulmonary Disease

## 2013-02-12 DIAGNOSIS — J8410 Pulmonary fibrosis, unspecified: Secondary | ICD-10-CM

## 2013-02-12 DIAGNOSIS — J849 Interstitial pulmonary disease, unspecified: Secondary | ICD-10-CM

## 2013-02-19 ENCOUNTER — Encounter: Payer: Self-pay | Admitting: *Deleted

## 2013-02-26 ENCOUNTER — Telehealth: Payer: Self-pay | Admitting: Pulmonary Disease

## 2013-02-26 NOTE — Telephone Encounter (Signed)
Rec'd from [facility] forward 4 pages to [facility]

## 2013-03-09 ENCOUNTER — Telehealth: Payer: Self-pay | Admitting: Pulmonary Disease

## 2013-03-09 ENCOUNTER — Encounter: Payer: Self-pay | Admitting: Pulmonary Disease

## 2013-03-09 NOTE — Telephone Encounter (Signed)
PFTs are scheduled for 04/02/13 at 12p here at our office.

## 2013-03-09 NOTE — Telephone Encounter (Signed)
Dawn Gomez, has this pt had her pfts and if not when are they scheduled? Thanks.

## 2013-03-23 ENCOUNTER — Other Ambulatory Visit: Payer: Self-pay | Admitting: Gastroenterology

## 2013-03-23 DIAGNOSIS — K760 Fatty (change of) liver, not elsewhere classified: Secondary | ICD-10-CM

## 2013-04-02 ENCOUNTER — Encounter (INDEPENDENT_AMBULATORY_CARE_PROVIDER_SITE_OTHER): Payer: Self-pay

## 2013-04-02 ENCOUNTER — Ambulatory Visit (INDEPENDENT_AMBULATORY_CARE_PROVIDER_SITE_OTHER): Payer: Medicare Other | Admitting: Pulmonary Disease

## 2013-04-02 DIAGNOSIS — J849 Interstitial pulmonary disease, unspecified: Secondary | ICD-10-CM

## 2013-04-02 DIAGNOSIS — J8410 Pulmonary fibrosis, unspecified: Secondary | ICD-10-CM

## 2013-04-02 LAB — PULMONARY FUNCTION TEST
DLCO unc: 7.22 ml/min/mmHg
FEF 25-75 Post: 2.05 L/sec
FEV1-%Change-Post: -4 %
FEV1-%Pred-Pre: 87 %
FEV1-Post: 1.45 L
FEV1FVC-%Change-Post: 1 %
FEV6-Pre: 1.73 L
FEV6FVC-%Pred-Pre: 106 %
Post FEV1/FVC ratio: 89 %
Post FEV6/FVC ratio: 100 %
RV % pred: 44 %
RV: 0.93 L

## 2013-04-02 NOTE — Progress Notes (Signed)
PFT done today. 

## 2013-04-21 ENCOUNTER — Telehealth: Payer: Self-pay | Admitting: Pulmonary Disease

## 2013-04-21 NOTE — Telephone Encounter (Signed)
PFT's reviewed.  Her TLC has mildly decreased from last year, but her weight has increased as well.  Her DLCO is actually mildly improved from last year.  CT chest reviewed with radiology.  There may be slight progression of her interstitial process.  Is not a classic ct for UIP, but still could be.   Ashtyn, I have tried calling pt's home phone, and told calls are blocked (not accepting incoming calls).  Tried cell and said line was disconnected.  Please call primary md and see if they have another number for pt.  If not, needs certified letter sent to her home saying we need to speak with her. I just need to review the above info with her.

## 2013-04-22 NOTE — Telephone Encounter (Signed)
I have attempted to contact patient myself and was unable to reach d/t numbers being blocked or D/C ------ Have contacted PCP--given two numbers that we did not have on file: [phone] and [phone] ------- Tried both of the new numbers above and neither were in service. ------- Will send Certified letter to patient home address.

## 2013-04-23 ENCOUNTER — Other Ambulatory Visit: Payer: Medicare Other

## 2013-04-23 NOTE — Telephone Encounter (Signed)
Letter printed and mailed to patient to contact our office asap.

## 2013-04-28 ENCOUNTER — Ambulatory Visit
Admission: RE | Admit: 2013-04-28 | Discharge: 2013-04-28 | Disposition: A | Discharge status: Home / Self Care | Payer: Medicare Other | Source: Ambulatory Visit | Attending: Gastroenterology | Admitting: Gastroenterology

## 2013-04-28 DIAGNOSIS — K760 Fatty (change of) liver, not elsewhere classified: Secondary | ICD-10-CM

## 2013-04-30 ENCOUNTER — Telehealth: Payer: Self-pay | Admitting: Pulmonary Disease

## 2013-04-30 NOTE — Telephone Encounter (Signed)
Called and spoke with pt at the # above. She is calling back to speak w/ [facility] regarding her PFT results (see phone note 04/21/13) [place] would like to speak with her. Please advise thanks

## 2013-04-30 NOTE — Telephone Encounter (Signed)
Discussed results of ct chest and pfts with pt.   Mixed results, with ct and TLC being a little worse, and DLCO better.  She had gained weight and taken on fluid at last visit.  She has a cardiac w/u ongoing at this time with dr Einar Gip.  Would like to see her back, and if we feel that breathing issues are worsening because of her lung disease, will need biopsy.    Please make sure we have this number in snapshot to call pt. She needs f/u apptm with me the middle to end of march.  Let her know.

## 2013-05-03 NOTE — Telephone Encounter (Signed)
Pt scheduled for Monday 06/21/13 at 1130 with [facility]

## 2013-06-21 ENCOUNTER — Ambulatory Visit: Payer: Medicare Other | Admitting: Pulmonary Disease

## 2013-07-08 ENCOUNTER — Encounter: Payer: Self-pay | Admitting: Pulmonary Disease

## 2013-07-08 ENCOUNTER — Ambulatory Visit (INDEPENDENT_AMBULATORY_CARE_PROVIDER_SITE_OTHER): Payer: Medicare Other | Admitting: Pulmonary Disease

## 2013-07-08 VITALS — BP 94/60 | HR 66 | Temp 97.3°F | Ht 61.0 in | Wt 188.8 lb

## 2013-07-08 DIAGNOSIS — J849 Interstitial pulmonary disease, unspecified: Secondary | ICD-10-CM

## 2013-07-08 DIAGNOSIS — J8410 Pulmonary fibrosis, unspecified: Secondary | ICD-10-CM

## 2013-07-08 DIAGNOSIS — R0600 Dyspnea, unspecified: Secondary | ICD-10-CM

## 2013-07-08 NOTE — Assessment & Plan Note (Signed)
The patient has abnormal changes on her CT chest that are suggestive of UIP, but certainly not diagnostic. Prior to her smoking cessation, the leading diagnosis was RB ILD. She has now quit smoking, but has had some worsening of her PFTs, and now the question is raised whether she may have a different type of ILD. The patient currently feels that her breathing is at baseline, and I've asked her to work aggressively on weight loss and conditioning. I would like to image her chest and deep breathing studies one more time at the one-year [place] [place] to see if there has been significant change. If so, we'll need to decide about lung biopsy, and will also need to evaluate for autoimmune disease.

## 2013-07-08 NOTE — Patient Instructions (Signed)
Will see you back in 109mos, with ct chest just before the visit and breathing studies the same day.  Based on this, we can decide about how aggressive to be with your lung disease.  Work on weight loss and conditioning.

## 2013-07-08 NOTE — Progress Notes (Signed)
   Subjective:    Patient ID: Dawn Gomez, female    DOB: Jan 21, 1936, 78 y.o.   MRN: [id number]  HPI Patient comes in today for followup of her known interstitial lung disease and dyspnea on exertion. Her lung disease was initially felt secondary to RB ILD, and she was asked to stop smoking. She has been successful in doing this, but she has had some increase in shortness of breath with some worsening of her PFTs. The question has been raised whether this could represent usual interstitial pneumonitis. Since the last visit, the patient feels that her breathing has been stable, although she still has significant dyspnea on exertion. She has had a cardiac workup with her cardiologist, and tells me that she had single coronary disease which they are treating medically. She has not made headway with weight loss or a conditioning program.   Review of Systems  Constitutional: Negative for fever and unexpected weight change.  HENT: Negative for congestion, dental problem, ear pain, nosebleeds, postnasal drip, rhinorrhea, sinus pressure, sneezing, sore throat and trouble swallowing.        Allergies  Eyes: Negative for redness and itching.  Respiratory: Positive for cough and shortness of breath. Negative for chest tightness and wheezing.   Cardiovascular: Negative for palpitations and leg swelling.  Gastrointestinal: Negative for nausea and vomiting.  Genitourinary: Negative for dysuria.  Musculoskeletal: Negative for joint swelling.  Skin: Negative for rash.  Neurological: Negative for headaches.  Hematological: Does not bruise/bleed easily.  Psychiatric/Behavioral: Negative for dysphoric mood. The patient is not nervous/anxious.        Objective:   Physical Exam Obese female in nad Nose without purulence or discharge noted Neck without lymphadenopathy or thyromegaly Chest with very mild bibasilar crackles, no wheezing Cardiac exam with regular rate and rhythm Lower extremities with mild  edema, no cyanosis Alert and oriented, moves all 4 extremities.       Assessment & Plan:

## 2013-10-05 ENCOUNTER — Other Ambulatory Visit: Payer: Self-pay

## 2013-10-05 DIAGNOSIS — Z1231 Encounter for screening mammogram for malignant neoplasm of breast: Secondary | ICD-10-CM

## 2013-10-27 ENCOUNTER — Encounter (INDEPENDENT_AMBULATORY_CARE_PROVIDER_SITE_OTHER): Payer: Self-pay

## 2013-10-27 ENCOUNTER — Ambulatory Visit
Admission: RE | Admit: 2013-10-27 | Discharge: 2013-10-27 | Disposition: A | Discharge status: Home / Self Care | Payer: Medicare Other | Source: Ambulatory Visit

## 2013-10-27 DIAGNOSIS — Z1231 Encounter for screening mammogram for malignant neoplasm of breast: Secondary | ICD-10-CM

## 2013-11-12 ENCOUNTER — Ambulatory Visit: Payer: Self-pay

## 2013-11-26 ENCOUNTER — Ambulatory Visit: Payer: Self-pay

## 2014-01-07 ENCOUNTER — Ambulatory Visit (INDEPENDENT_AMBULATORY_CARE_PROVIDER_SITE_OTHER)
Admission: RE | Admit: 2014-01-07 | Discharge: 2014-01-07 | Disposition: A | Discharge status: Home / Self Care | Payer: Medicare Other | Source: Ambulatory Visit | Attending: Pulmonary Disease | Admitting: Pulmonary Disease

## 2014-01-07 DIAGNOSIS — J849 Interstitial pulmonary disease, unspecified: Secondary | ICD-10-CM

## 2014-01-11 ENCOUNTER — Ambulatory Visit: Payer: Medicare Other | Admitting: Pulmonary Disease

## 2014-01-27 ENCOUNTER — Other Ambulatory Visit: Payer: Self-pay | Admitting: Pulmonary Disease

## 2014-01-27 DIAGNOSIS — J849 Interstitial pulmonary disease, unspecified: Secondary | ICD-10-CM

## 2014-01-28 ENCOUNTER — Ambulatory Visit (INDEPENDENT_AMBULATORY_CARE_PROVIDER_SITE_OTHER): Payer: Medicare Other | Admitting: Pulmonary Disease

## 2014-01-28 ENCOUNTER — Encounter: Payer: Self-pay | Admitting: Pulmonary Disease

## 2014-01-28 ENCOUNTER — Other Ambulatory Visit (INDEPENDENT_AMBULATORY_CARE_PROVIDER_SITE_OTHER): Payer: Medicare Other

## 2014-01-28 VITALS — BP 124/62 | HR 63 | Temp 98.0°F | Ht 61.0 in | Wt 178.0 lb

## 2014-01-28 DIAGNOSIS — Z23 Encounter for immunization: Secondary | ICD-10-CM

## 2014-01-28 DIAGNOSIS — J849 Interstitial pulmonary disease, unspecified: Secondary | ICD-10-CM

## 2014-01-28 LAB — PULMONARY FUNCTION TEST
DL/VA % pred: 53 %
DL/VA: 2.27 ml/min/mmHg/L
DLCO unc % pred: 32 %
DLCO unc: 6.22 ml/min/mmHg
FEF 25-75 Post: 2.32 L/sec
FEF 25-75 Pre: 2.31 L/sec
FEF2575-%CHANGE-POST: 0 %
FEF2575-%PRED-POST: 166 %
FEF2575-%Pred-Pre: 166 %
FEV1-%CHANGE-POST: -2 %
FEV1-%PRED-POST: 88 %
FEV1-%PRED-PRE: 90 %
FEV1-PRE: 1.54 L
FEV1-Post: 1.51 L
FEV1FVC-%Change-Post: -1 %
FEV1FVC-%PRED-PRE: 115 %
FEV6-%Change-Post: -1 %
FEV6-%Pred-Post: 79 %
FEV6-%Pred-Pre: 81 %
FEV6-PRE: 1.78 L
FEV6-Post: 1.74 L
FEV6FVC-%Change-Post: 0 %
FEV6FVC-%PRED-PRE: 106 %
FEV6FVC-%Pred-Post: 105 %
FVC-%Change-Post: -1 %
FVC-%PRED-PRE: 76 %
FVC-%Pred-Post: 76 %
FVC-Post: 1.76 L
FVC-Pre: 1.78 L
POST FEV1/FVC RATIO: 86 %
POST FEV6/FVC RATIO: 99 %
PRE FEV6/FVC RATIO: 100 %
Pre FEV1/FVC ratio: 87 %
RV % PRED: 38 %
RV: 0.81 L
TLC % pred: 65 %
TLC: 2.9 L

## 2014-01-28 LAB — SEDIMENTATION RATE: SED RATE: 6 mm/h (ref 0–22)

## 2014-01-28 LAB — RHEUMATOID FACTOR: Rhuematoid fact SerPl-aCnc: 10 IU/mL (ref <=14)

## 2014-01-28 NOTE — Patient Instructions (Signed)
Will check bloodwork for autoimmune disease that can affect lungs.  Will call you with results. followup with me again in 60mos, but call if you feel your breathing is worsening.

## 2014-01-28 NOTE — Progress Notes (Signed)
   Subjective:    Patient ID: Dawn Gomez, female    DOB: January 27, 1936, 78 y.o.   MRN: [id number]  HPI The patient comes in today for followup of her known interstitial lung disease and dyspnea. She has continued to lose weight, and feels that her breathing is greatly improved as she loses further weight. She has minimal cough, and no significant chest congestion. She has had a followup high-resolution CT that shows slight worsening of her interstitial changes, and the pattern is starting to look more consistent with usual interstitial pneumonia. Her PFTs however, are actually improved in many of her parameters.   Review of Systems  Constitutional: Negative for fever and unexpected weight change.  HENT: Positive for congestion, postnasal drip and rhinorrhea. Negative for dental problem, ear pain, nosebleeds, sinus pressure, sneezing, sore throat and trouble swallowing.   Eyes: Negative for redness and itching.  Respiratory: Positive for cough and shortness of breath. Negative for chest tightness and wheezing.   Cardiovascular: Negative for palpitations and leg swelling.  Gastrointestinal: Negative for nausea and vomiting.  Genitourinary: Negative for dysuria.  Musculoskeletal: Negative for joint swelling.  Skin: Negative for rash.  Neurological: Negative for headaches.  Hematological: Does not bruise/bleed easily.  Psychiatric/Behavioral: Negative for dysphoric mood. The patient is not nervous/anxious.        Objective:   Physical Exam Overweight female in no acute distress Nose without purulence or discharge noted Neck without lymphadenopathy or thyromegaly Chest with minimal basilar crackles, excellent airflow, no wheezing Heart exam with regular rate and rhythm Lower extremities with mild edema, no cyanosis Alert and oriented, moves all 4 extremities.       Assessment & Plan:

## 2014-01-28 NOTE — Assessment & Plan Note (Signed)
The patient feels that her breathing is definitely improved since working on weight loss and conditioning. Her high-resolution CT does show slight worsening, and the pattern of UIP is starting to become more prominent. She has no history consistent with autoimmune disease, but I do think we should check her blood work to evaluate for this. Her pulmonary function studies are actually a little improved from the last check. I have had a long discussion with her about pulmonary fibrosis, and I think at this point that we should continue cautious observation. Again, I would like to check her autoimmune blood work, and if this is significantly positive would consider starting treatment. If it is unremarkable, we will continue to monitor her symptoms, PFTs, and her x-rays change. The patient is agreeable to this approach.

## 2014-01-28 NOTE — Progress Notes (Signed)
PFT done today. 

## 2014-01-29 LAB — ANGIOTENSIN CONVERTING ENZYME: Angiotensin-Converting Enzyme: 9 U/L (ref 8–52)

## 2014-01-31 LAB — ANTI-DNA ANTIBODY, DOUBLE-STRANDED: ds DNA Ab: <1 [IU]/mL

## 2014-01-31 LAB — EXTRACTABLE NUCLEAR ANTIGEN ANTIBODY
ENA SM Ab Ser-aCnc: <1
SM/RNP: <1
SSA (Ro) (ENA) Antibody, IgG: <1
SSB (La) (ENA) Antibody, IgG: <1
Scleroderma (Scl-70) (ENA) Antibody, IgG: <1
ds DNA Ab: <1 [IU]/mL

## 2014-01-31 LAB — ANA: ANA: NEGATIVE

## 2014-01-31 LAB — SJOGRENS SYNDROME-A EXTRACTABLE NUCLEAR ANTIBODY: SSA (RO) (ENA) ANTIBODY, IGG: NEGATIVE

## 2014-01-31 LAB — CYCLIC CITRUL PEPTIDE ANTIBODY, IGG

## 2014-01-31 LAB — ALDOLASE: ALDOLASE: 6.4 U/L (ref <=8.1)

## 2014-01-31 LAB — SJOGRENS SYNDROME-B EXTRACTABLE NUCLEAR ANTIBODY: SSB (LA) (ENA) ANTIBODY, IGG: NEGATIVE

## 2014-01-31 LAB — ANTI-SCLERODERMA ANTIBODY: Scleroderma (Scl-70) (ENA) Antibody, IgG: <1

## 2014-06-23 ENCOUNTER — Other Ambulatory Visit: Payer: Self-pay | Admitting: Family Medicine

## 2014-06-23 ENCOUNTER — Ambulatory Visit
Admission: RE | Admit: 2014-06-23 | Discharge: 2014-06-23 | Disposition: A | Discharge status: Home / Self Care | Payer: PPO | Source: Ambulatory Visit | Attending: Family Medicine | Admitting: Family Medicine

## 2014-06-23 DIAGNOSIS — R109 Unspecified abdominal pain: Secondary | ICD-10-CM

## 2014-07-27 ENCOUNTER — Ambulatory Visit: Payer: Self-pay | Admitting: Pulmonary Disease

## 2014-08-05 ENCOUNTER — Ambulatory Visit (INDEPENDENT_AMBULATORY_CARE_PROVIDER_SITE_OTHER): Payer: PPO | Admitting: Pulmonary Disease

## 2014-08-05 ENCOUNTER — Encounter: Payer: Self-pay | Admitting: Pulmonary Disease

## 2014-08-05 VITALS — BP 110/66 | HR 70 | Temp 97.4°F | Ht 61.0 in | Wt 173.2 lb

## 2014-08-05 DIAGNOSIS — J849 Interstitial pulmonary disease, unspecified: Secondary | ICD-10-CM | POA: Diagnosis not present

## 2014-08-05 NOTE — Patient Instructions (Signed)
Continue to stay as active as possible Keep working on weight reduction, you are doing great. followup with Dr. Lake Bells in October, with breathing studies and chest xray on the same day.

## 2014-08-05 NOTE — Assessment & Plan Note (Signed)
The patient has been doing very well from a pulmonary standpoint. She has continued to slowly lose weight, and is trying to stay as active as possible. She feels that her breathing is at a stable baseline, and has minimal cough that is primarily related to postnasal drip. I had a discussion with her today about starting on anti-fibrotic therapy for her probable IPF, and she wishes to wait until her next reevaluation in October.  I am in agreement with this, since she has been fairly stable over the last few yrs.

## 2014-08-05 NOTE — Progress Notes (Signed)
   Subjective:    Patient ID: Dawn Gomez, female    DOB: Oct 29, 1935, 79 y.o.   MRN: [id number]  HPI The patient comes in today for follow-up of her known interstitial lung disease, that is felt secondary to idiopathic pulmonary fibrosis. Has done very well since her last visit, and feels that her exertional tolerance is at baseline.  She has had very little cough, and thinks it is primarily related to postnasal drip. She is on a very low-dose ACE inhibitor. She has lost an additional 5 pounds since the last visit, and is trying to stay active.   Review of Systems  Constitutional: Negative for fever and unexpected weight change.  HENT: Positive for congestion and postnasal drip. Negative for dental problem, ear pain, nosebleeds, rhinorrhea, sinus pressure, sneezing, sore throat and trouble swallowing.   Eyes: Negative for redness and itching.  Respiratory: Positive for cough and shortness of breath. Negative for chest tightness and wheezing.   Cardiovascular: Negative for palpitations and leg swelling.  Gastrointestinal: Negative for nausea and vomiting.  Genitourinary: Negative for dysuria.  Musculoskeletal: Negative for joint swelling.  Skin: Negative for rash.  Neurological: Negative for headaches.  Hematological: Does not bruise/bleed easily.  Psychiatric/Behavioral: Negative for dysphoric mood. The patient is not nervous/anxious.        Objective:   Physical Exam Overweight female in no acute distress Nose without purulence or discharge noted Neck without lymphadenopathy or thyromegaly Chest with crackles one third the way up bilaterally, no wheezes Cardiac exam with regular rate and rhythm Lower extremities with minimal edema, no cyanosis Alert and oriented, moves all 4 extremities.       Assessment & Plan:

## 2015-02-16 ENCOUNTER — Ambulatory Visit: Payer: PPO | Admitting: Pulmonary Disease

## 2015-03-24 ENCOUNTER — Telehealth: Payer: Self-pay | Admitting: Pulmonary Disease

## 2015-03-24 ENCOUNTER — Other Ambulatory Visit (INDEPENDENT_AMBULATORY_CARE_PROVIDER_SITE_OTHER): Payer: PPO

## 2015-03-24 ENCOUNTER — Ambulatory Visit (INDEPENDENT_AMBULATORY_CARE_PROVIDER_SITE_OTHER): Payer: PPO | Admitting: Adult Health

## 2015-03-24 ENCOUNTER — Ambulatory Visit (INDEPENDENT_AMBULATORY_CARE_PROVIDER_SITE_OTHER)
Admission: RE | Admit: 2015-03-24 | Discharge: 2015-03-24 | Disposition: A | Discharge status: Home / Self Care | Payer: PPO | Source: Ambulatory Visit | Attending: Adult Health | Admitting: Adult Health

## 2015-03-24 ENCOUNTER — Telehealth: Payer: Self-pay | Admitting: *Deleted

## 2015-03-24 ENCOUNTER — Encounter: Payer: Self-pay | Admitting: Adult Health

## 2015-03-24 VITALS — BP 118/72 | HR 72 | Ht 61.0 in | Wt 181.4 lb

## 2015-03-24 DIAGNOSIS — J8410 Pulmonary fibrosis, unspecified: Secondary | ICD-10-CM

## 2015-03-24 DIAGNOSIS — J849 Interstitial pulmonary disease, unspecified: Secondary | ICD-10-CM | POA: Diagnosis not present

## 2015-03-24 DIAGNOSIS — J9611 Chronic respiratory failure with hypoxia: Secondary | ICD-10-CM

## 2015-03-24 DIAGNOSIS — J9610 Chronic respiratory failure, unspecified whether with hypoxia or hypercapnia: Secondary | ICD-10-CM | POA: Insufficient documentation

## 2015-03-24 LAB — BASIC METABOLIC PANEL
BUN: 14 mg/dL (ref 6–23)
CALCIUM: 9.4 mg/dL (ref 8.4–10.5)
CO2: 30 meq/L (ref 19–32)
Chloride: 101 mEq/L (ref 96–112)
Creatinine, Ser: 0.71 mg/dL (ref 0.40–1.20)
GFR: 84.85 mL/min (ref >60.00)
Glucose, Bld: 87 mg/dL (ref 70–99)
Potassium: 4.5 mEq/L (ref 3.5–5.1)
SODIUM: 137 meq/L (ref 135–145)

## 2015-03-24 LAB — BRAIN NATRIURETIC PEPTIDE: Pro B Natriuretic peptide (BNP): 23 pg/mL (ref 0.0–100.0)

## 2015-03-24 NOTE — Patient Instructions (Addendum)
Chest xray today .  Labs today  Follow up with cardiology next week for Echo as planned  Continue on Lasix '40mg'$  daily .  Low salt diet.  Begin Oxygen 2l/m .  Follow up in 2-3 weeks with Dr. Lake Bells  with PFT . Please contact office for sooner follow up if symptoms do not improve or worsen or seek emergency care    Late add:  CXR shows ? Progressive ILD - set up CT chest and begin pred taper .

## 2015-03-24 NOTE — Progress Notes (Signed)
Subjective:    Patient ID: Dawn Gomez, female    DOB: 04-30-1935, 79 y.o.   MRN: [id number]  HPI 79 yo female former smoker with ILD  Former smoker of Dr. Gwenette Greet    TEST  LABS 2009:  ANA, RF, ACE, ESR, all ok.  PFT's 10/2010:  No obstruction, TLC 3.05 (73% pred), DLCO 6.1 (31%) CXR 10/2010:  Stable interstitial changes. Felt to have RBILD vs ?IPF? CXR 2013:  Stable IS changes PFT's 2013:  No obstruction, TLC 3.05 (73%), DLCO 6.3 (31%) ONO on RA with cpap 06/2012:  Low sat 85%, only 7 min less than 88% entire night.  Echo 06/2012:  Normal LV, DD, normal RV, no evidence for pulmonary htn.  CT chest 2014:  Subpleural reticulation, ?early honeycombing, per chest radiology >> no definite GGO, may be UIP or maybe not.  PFT's 03/2013:  FEV1 1.52 (87%), ratio 88, FVC 1.73 (74%), TLC 2.80 (62%), DLCO 7.22 (38%) Ambul ox 07/2013:  desat to 87% transiently.  Pt would like to hold off on portable oxygen  PFT's 01/2014:  FEV1 1.54 (90%), ratio 87, FVC 1.78 (76%), TLC 2.90 (65%), DLCO 6.22 (32%) Autoimmune 01/2014:  Negative HRCT 01/2014:  Slight progression of ISLD, and pattern suggestive of UIP Can't afford pulmonary rehab 07/2014 discussed anti-fibrotic agents.  Pt wishes to wait until re-evaluation in October.      03/24/2015 Acute OV :  Patient presents for an acute office visit. Patient says over the last 6 months that she feels that her shortness of breath has been getting progressively worse. Over the last 3 weeks she has been having increased shortness of breath, lower extremity swelling. She gets short of breath with any type of extended walking or singing . She was seen by cardiology last week. Her Lasix was increased to 40 mg daily. Patient been having increased lower extremity swelling. This has helped. Breathing is only slightly improved. She has been set up for a 2-D echo next week.. Previous echo showed no evidence of pulmonary hypertension with a normal left ventricular function  in 2014. Patient is previously followed by Dr. Gwenette Greet. Most recent pulmonary function test showed no airflow obstruction with mild restriction and a significant decrease DLCO at 32%. She has had serial, CT chest with the most recent high resolution CT chest in October 2015 showing slight progression of her ILD with a pattern suggestive of UIP. Autoimmune panel in the past has been negative. She does have a dry cough but says it is intermittent. Patient is on ACE inhibitor, but has not noticed a flare of her cough. She denies any chest pain , calf pain, hemoptysis, orthopnea, fever, discolored mucus. Patient was previously on nocturnal oxygen but this was discontinued 6 months ago. She does have documented desaturations with ambulation  However, declined portable oxygen. In the past Today on arrival O2 sats saturation was decreasche d at 77% on arrival. At rest. O2 saturations did increase to 90%. Patient was placed on 2 L of oxygen with O2 saturations at 95%. She did require 3 L of oxygen with ambulation to maintain a saturation greater than 90%      Past Medical History  Diagnosis Date  . OSA (obstructive sleep apnea)     on cpap  . Abnormal heart rhythm   . Dyspnea   . Hypothyroid    Current Outpatient Prescriptions on File Prior to Visit  Medication Sig Dispense Refill  . aspirin 81 MG tablet Take 81 mg by  mouth daily.    Marland Kitchen atorvastatin (LIPITOR) 10 MG tablet Take 10 mg by mouth daily.    Marland Kitchen diltiazem (CARDIZEM CD) 180 MG 24 hr capsule Take 180 mg by mouth daily.      Marland Kitchen ESTRACE VAGINAL 0.1 MG/GM vaginal cream Take as directed    . fenofibrate (TRICOR) 145 MG tablet Take 145 mg by mouth daily.    . furosemide (LASIX) 20 MG tablet Take 20 mg by mouth daily.    Marland Kitchen levothyroxine (SYNTHROID, LEVOTHROID) 100 MCG tablet Take 100 mcg by mouth daily.      Marland Kitchen lisinopril (PRINIVIL,ZESTRIL) 5 MG tablet Once daily    . potassium chloride SA (K-DUR,KLOR-CON) 20 MEQ tablet Take 20 mEq by mouth daily.       No current facility-administered medications on file prior to visit.      Review of Systems Constitutional:   No  weight loss, night sweats,  Fevers, chills,  +fatigue, or  lassitude.  HEENT:   No headaches,  Difficulty swallowing,  Tooth/dental problems, or  Sore throat,                No sneezing, itching, ear ache, nasal congestion, post nasal drip,   CV:  No chest pain,  Orthopnea, PND, swelling in lower extremities, anasarca, dizziness, palpitations, syncope.   GI  No heartburn, indigestion, abdominal pain, nausea, vomiting, diarrhea, change in bowel habits, loss of appetite, bloody stools.   Resp:    No chest wall deformity  Skin: no rash or lesions.  GU: no dysuria, change in color of urine, no urgency or frequency.  No flank pain, no hematuria   MS:  No joint pain or swelling.  No decreased range of motion.  No back pain.  Psych:  No change in mood or affect. No depression or anxiety.  No memory loss.          Objective:   Physical Exam  Filed Vitals:   03/24/15 1218  BP: 118/72  Pulse: 72  Height: _0  (1.549 m)  Weight: 181 lb 6.4 oz (82.283 kg)  SpO2: 95%    GEN: A/Ox3; pleasant , NAD, elderly and frail  HEENT:  [place]/AT,  EACs-clear, TMs-wnl, NOSE-clear, THROAT-clear, no lesions, no postnasal drip or exudate noted.   NECK:  Supple w/ fair ROM; no JVD; normal carotid impulses w/o bruits; no thyromegaly or nodules palpated; no lymphadenopathy.  RESP  bibasilar crackles  , no accessory muscle use, no dullness to percussion  CARD:  RRR, no m/r/g  , trace peripheral edema, pulses intact, no cyanosis or clubbing. Negative calf pain or tenderness.  GI:   Soft & nt; nml bowel sounds; no organomegaly or masses detected.  Musco: Warm bil, no deformities or joint swelling noted.   Neuro: alert, no focal deficits noted.    Skin: Warm, no lesions or rashes         Assessment & Plan:

## 2015-03-24 NOTE — Telephone Encounter (Signed)
Error

## 2015-03-24 NOTE — Assessment & Plan Note (Signed)
Suspect a possible progressive ILD changes with progressive dyspnea over the last 6 months.. Patient may also have a degree of volume overload. Encouraged her to continue on Lasix 40 mg a follow-up for echo next week. We'll check a BNP today. We'll also check a chest x-ray. Pending those results, decide if a steroid taper would be of benefit..  Plan  Chest xray today .  Labs today  Follow up with cardiology next week for Echo as planned  Continue on Lasix '40mg'$  daily .  Low salt diet.  Begin Oxygen 2l/m .  Follow up in 2-3 weeks with Dr. Lake Bells  with PFT . Please contact office for sooner follow up if symptoms do not improve or worsen or seek emergency care

## 2015-03-24 NOTE — Telephone Encounter (Signed)
Opened msg in error.  Closing encounter.

## 2015-03-24 NOTE — Assessment & Plan Note (Signed)
Patient with worsening desaturations with ambulation . Question will secondary to progressive ILD, plus or minus volume overload We'll begin oxygen at 2 L at rest and 3 L with activity

## 2015-03-27 ENCOUNTER — Other Ambulatory Visit: Payer: Self-pay | Admitting: Adult Health

## 2015-03-27 ENCOUNTER — Other Ambulatory Visit: Payer: Self-pay

## 2015-03-27 DIAGNOSIS — J849 Interstitial pulmonary disease, unspecified: Secondary | ICD-10-CM

## 2015-03-27 MED ORDER — PREDNISONE 10 MG PO TABS: ORAL_TABLET | ORAL | Status: DC

## 2015-03-27 NOTE — Progress Notes (Signed)
Quick Note:  Called and spoke to pt. Informed her of the results and recs per TP. CT order placed and pred taper already sent to preferred pharmacy. Pt verbalized understanding and denied any further questions or concerns at this time.   ______

## 2015-03-27 NOTE — Progress Notes (Signed)
Complicated case, agree with plan for work up, low sodium diet adherence and f/u with me

## 2015-03-27 NOTE — Progress Notes (Signed)
Quick Note:  Called and spoke to pt. Informed her of the results and recs per TP. Rx sent to preferred pharmacy. Pt verbalized understanding and denied any further questions or concerns at this time.   ______

## 2015-04-04 ENCOUNTER — Ambulatory Visit (INDEPENDENT_AMBULATORY_CARE_PROVIDER_SITE_OTHER)
Admission: RE | Admit: 2015-04-04 | Discharge: 2015-04-04 | Disposition: A | Discharge status: Home / Self Care | Payer: PPO | Source: Ambulatory Visit | Attending: Adult Health | Admitting: Adult Health

## 2015-04-04 DIAGNOSIS — J849 Interstitial pulmonary disease, unspecified: Secondary | ICD-10-CM | POA: Diagnosis not present

## 2015-04-13 ENCOUNTER — Other Ambulatory Visit: Payer: Self-pay | Admitting: Pulmonary Disease

## 2015-04-13 DIAGNOSIS — R0600 Dyspnea, unspecified: Secondary | ICD-10-CM

## 2015-04-14 ENCOUNTER — Ambulatory Visit (INDEPENDENT_AMBULATORY_CARE_PROVIDER_SITE_OTHER): Payer: PPO | Admitting: Pulmonary Disease

## 2015-04-14 ENCOUNTER — Ambulatory Visit: Payer: PPO | Admitting: Pulmonary Disease

## 2015-04-14 ENCOUNTER — Other Ambulatory Visit (INDEPENDENT_AMBULATORY_CARE_PROVIDER_SITE_OTHER): Payer: PPO

## 2015-04-14 ENCOUNTER — Encounter: Payer: Self-pay | Admitting: Pulmonary Disease

## 2015-04-14 VITALS — BP 110/58 | HR 97 | Ht 60.0 in | Wt 185.0 lb

## 2015-04-14 DIAGNOSIS — R0600 Dyspnea, unspecified: Secondary | ICD-10-CM

## 2015-04-14 DIAGNOSIS — J849 Interstitial pulmonary disease, unspecified: Secondary | ICD-10-CM

## 2015-04-14 LAB — PULMONARY FUNCTION TEST
DL/VA % pred: 56 %
DL/VA: 2.38 ml/min/mmHg/L
DLCO UNC % PRED: 34 %
DLCO UNC: 6.5 ml/min/mmHg
FEF 25-75 PRE: 2.23 L/s
FEF 25-75 Post: 2.28 L/sec
FEF2575-%CHANGE-POST: 2 %
FEF2575-%PRED-POST: 171 %
FEF2575-%PRED-PRE: 167 %
FEV1-%Change-Post: 0 %
FEV1-%PRED-POST: 85 %
FEV1-%Pred-Pre: 85 %
FEV1-POST: 1.42 L
FEV1-Pre: 1.43 L
FEV1FVC-%Change-Post: 2 %
FEV1FVC-%PRED-PRE: 115 %
FEV6-%CHANGE-POST: -3 %
FEV6-%PRED-POST: 75 %
FEV6-%Pred-Pre: 78 %
FEV6-POST: 1.62 L
FEV6-Pre: 1.67 L
FEV6FVC-%PRED-POST: 106 %
FEV6FVC-%Pred-Pre: 106 %
FVC-%Change-Post: -3 %
FVC-%PRED-POST: 71 %
FVC-%Pred-Pre: 73 %
FVC-Post: 1.62 L
FVC-Pre: 1.67 L
POST FEV1/FVC RATIO: 88 %
PRE FEV1/FVC RATIO: 86 %
PRE FEV6/FVC RATIO: 100 %
Post FEV6/FVC ratio: 100 %
RV % PRED: 46 %
RV: 1 L
TLC % pred: 62 %
TLC: 2.76 L

## 2015-04-14 LAB — RHEUMATOID FACTOR: Rhuematoid fact SerPl-aCnc: 10 IU/mL (ref <=14)

## 2015-04-14 LAB — C-REACTIVE PROTEIN: CRP: 1.3 mg/dL — ABNORMAL HIGH (ref <0.60)

## 2015-04-14 NOTE — Patient Instructions (Signed)
We will refer you to pulmonary rehab We will call you with the results of the bloodwork Exercise regularly, try to lose 15-20 pounds Keep using the oxygen as you're doing I'm going to see you back in 3 months When I see you back in 6 months we will get a lung function test and a 6 minute walk.

## 2015-04-14 NOTE — Progress Notes (Signed)
Subjective:    Patient ID: Dawn Gomez, female    DOB: 1935/12/01, 80 y.o.   MRN: [id number] Synopsis:  Former patient of Dr. Gwenette Greet has pulmonary fibrosis of uncertain etiology. Dr. Gwenette Greet summarized her clinical situation as follows: LABS 2009:  ANA, RF, ACE, ESR, all ok.  PFT's 10/2010:  No obstruction, TLC 3.05 (73% pred), DLCO 6.1 (31%) CXR 10/2010:  Stable interstitial changes. Felt to have RBILD vs ?IPF? CXR 2013:  Stable IS changes PFT's 2013:  No obstruction, TLC 3.05 (73%), DLCO 6.3 (31%) ONO on RA with cpap 06/2012:  Low sat 85%, only 7 min less than 88% entire night.  Echo 06/2012:  Normal LV, DD, normal RV, no evidence for pulmonary htn.  CT chest 2014:  Subpleural reticulation, ?early honeycombing, per chest radiology >> no definite GGO, may be UIP or maybe not.  PFT's 03/2013:  FEV1 1.52 (87%), ratio 88, FVC 1.73 (74%), TLC 2.80 (62%), DLCO 7.22 (38%) Ambul ox 07/2013:  desat to 87% transiently.  Pt would like to hold off on portable oxygen  PFT's 01/2014:  FEV1 1.54 (90%), ratio 87, FVC 1.78 (76%), TLC 2.90 (65%), DLCO 6.22 (32%) Auto[facility] 01/2014:  Negative HRCT 01/2014:  Slight progression of ISLD, and pattern suggestive of UIP Can't afford pulmonary rehab 07/2014 discussed anti-fibrotic agents.  Pt wishes to wait until re-evaluation in October.  03/2015 CT chest , stable fibrosis since 2014   HPI Chief Complaint  Patient presents with  . Follow-up    Former [facility] pt. Pt here after PFT. Pt saw TP in 03/2015 for an acute visit. Pt states her breathing has improved since last OV with TP. Pt states her SOB is not yet back to baseline. Pt c/o cough with little mucus production - yellow in color, PND, and chest tightness when SOB.    Saw Tammy last month for cough, dyspnea, swelling in her legs.  Was put on oxygen because her O2 saturation was ~70% on RA.Marland Kitchen  Lasix was increased and took a prednisone taper. Put on oxygen for the first time.   Her breathing has been OK.  She says  that if she breathes through her nose she does better.  Dr. Einar Gip felt that her heart was OK.  He said that her lungs were the problem in addition to her weight gain (15 pounds in two months).   She is still coughing up mucus, but not often.  She has chornic sinusitis and has post nasal drip "all the time".  She thinks the cough is due to this.  She thinks that the prednisone may have helped a little, but not as significant as her oxygen.  She gained weight on prednisone.    Past Medical History  Diagnosis Date  . OSA (obstructive sleep apnea)     on cpap  . Abnormal heart rhythm   . Dyspnea   . Hypothyroid      Review of Systems  Constitutional: Positive for fatigue. Negative for fever and chills.  HENT: Negative for postnasal drip, rhinorrhea and sinus pressure.   Respiratory: Positive for shortness of breath. Negative for cough and wheezing.   Cardiovascular: Negative for chest pain, palpitations and leg swelling.       Objective:   Physical Exam Filed Vitals:   04/14/15 1606  BP: 110/58  Pulse: 97  Height: 5' (1.524 m)  Weight: 185 lb (83.915 kg)  SpO2: 93%   2L Imogene  Gen: chronically ill appearing HENT: OP clear, TM's clear,  neck supple PULM: Crackles bases B, normal percussion CV: RRR, no mgr, trace edema GI: BS+, soft, nontender Derm: no cyanosis or rash Psyche: normal mood and affect   Chart reviewed from my partner and nurse practioner's notes where she was cared for for her ILD     Assessment & Plan:  Interstitial lung disease ([place]) I have reviewed several years worth of imaging from [place] today in clinic. She has pulmonary fibrosis but it is not clearly usual interstitial pneumonitis. The American thoracic Society tells [place] that any adult older than age 51 should be considered to have idiopathic pulmonary fibrosis. However, her CT chest imaging is not consistent with usual interstitial pneumonitis and she has had a very slow progression over the last 9 years.  In fact, there is very little change in the 2016 study when compared to her 2014 CT chest. Clearly, there has been some progression over the last several years as she is now on oxygen. I think in part this is related to her weight gain.  In terms of possible causes of her pulmonary fibrosis I think we have absolutely no idea what is going on. Per my review of her chart and multiple CT scan she's been listed as having possible usual interstitial pneumonitis, nonspecific interstitial pneumonitis, RB-ILD, or possibly desquamative interstitial pneumonitis. I'm going to add hypersensitivity pneumonitis to the list of possibilities because she does live in a house with a lot of mold and mildew. However, I'm only contributing to the chaos that going on with her diagnostically as we really have no idea was happening. The only way to really find out what's going on would be to do an open lung biopsy, but I think that that would be too harsh for her considering her age, obesity, and current oxygen use.  I'm happy that her lung function testing his only very slowly declined over the last 5 years.  I explained all this to her in detail today.  Plan: I think the best approach is as follows: Repeat 6 minute walk now Refer to pulmonary rehabilitation Repeat 6 minute walk and pulmonary function testing in 6 months Hold anti-fibrotic therapy for now Hold steroid therapy as this has not been beneficial and has only caused her to gain weight Try hard to lose weight as soon as possible If she has decline in lung function testing in 6 minute walk in 6 months despite weight loss then consider anti-fibrotic therapy Check hypersensitivity pneumonitis panel  > 50% of time spent face to face in a 45 minute visit   Current outpatient prescriptions:  .  aspirin 81 MG tablet, Take 81 mg by mouth daily., Disp: , Rfl:  .  atorvastatin (LIPITOR) 10 MG tablet, Take 10 mg by mouth daily., Disp: , Rfl:  .  diltiazem (CARDIZEM  CD) 180 MG 24 hr capsule, Take 180 mg by mouth daily.  , Disp: , Rfl:  .  fenofibrate (TRICOR) 145 MG tablet, Take 145 mg by mouth daily., Disp: , Rfl:  .  furosemide (LASIX) 20 MG tablet, Take 20 mg by mouth daily., Disp: , Rfl:  .  levothyroxine (SYNTHROID, LEVOTHROID) 100 MCG tablet, Take 100 mcg by mouth daily.  , Disp: , Rfl:  .  lisinopril (PRINIVIL,ZESTRIL) 5 MG tablet, Once daily, Disp: , Rfl:  .  potassium chloride SA (K-DUR,KLOR-CON) 20 MEQ tablet, Take 20 mEq by mouth daily. , Disp: , Rfl:  .  ESTRACE VAGINAL 0.1 MG/GM vaginal cream, Reported on 04/14/2015, Disp: ,  Rfl:

## 2015-04-14 NOTE — Progress Notes (Signed)
PFT done today. 

## 2015-04-14 NOTE — Assessment & Plan Note (Signed)
I have reviewed several years worth of imaging from [place] today in clinic. She has pulmonary fibrosis but it is not clearly usual interstitial pneumonitis. The American thoracic Society tells [place] that any adult older than age 79 should be considered to have idiopathic pulmonary fibrosis. However, her CT chest imaging is not consistent with usual interstitial pneumonitis and she has had a very slow progression over the last 9 years. In fact, there is very little change in the 2016 study when compared to her 2014 CT chest. Clearly, there has been some progression over the last several years as she is now on oxygen. I think in part this is related to her weight gain.  In terms of possible causes of her pulmonary fibrosis I think we have absolutely no idea what is going on. Per my review of her chart and multiple CT scan she's been listed as having possible usual interstitial pneumonitis, nonspecific interstitial pneumonitis, RB-ILD, or possibly desquamative interstitial pneumonitis. I'm going to add hypersensitivity pneumonitis to the list of possibilities because she does live in a house with a lot of mold and mildew. However, I'm only contributing to the chaos that going on with her diagnostically as we really have no idea was happening. The only way to really find out what's going on would be to do an open lung biopsy, but I think that that would be too harsh for her considering her age, obesity, and current oxygen use.  I'm happy that her lung function testing his only very slowly declined over the last 5 years.  I explained all this to her in detail today.  Plan: I think the best approach is as follows: Repeat 6 minute walk now Refer to pulmonary rehabilitation Repeat 6 minute walk and pulmonary function testing in 6 months Hold anti-fibrotic therapy for now Hold steroid therapy as this has not been beneficial and has only caused her to gain weight Try hard to lose weight as soon as possible If she  has decline in lung function testing in 6 minute walk in 6 months despite weight loss then consider anti-fibrotic therapy Check hypersensitivity pneumonitis panel

## 2015-04-16 LAB — ANTI-JO 1 ANTIBODY, IGG: Anti JO-1: <0.2 AI (ref 0.0–0.9)

## 2015-04-17 LAB — ANTI-SCLERODERMA ANTIBODY: SCLERODERMA (SCL-70) (ENA) ANTIBODY, IGG: NEGATIVE

## 2015-04-17 LAB — CYCLIC CITRUL PEPTIDE ANTIBODY, IGG: Cyclic Citrullin Peptide Ab: <16 Units

## 2015-04-17 LAB — SJOGRENS SYNDROME-B EXTRACTABLE NUCLEAR ANTIBODY: SSB (LA) (ENA) ANTIBODY, IGG: NEGATIVE

## 2015-04-17 LAB — CENTROMERE ANTIBODIES: Centromere Ab Screen: <1

## 2015-04-17 LAB — SEDIMENTATION RATE: Sed Rate: 17 mm/hr (ref 0–22)

## 2015-04-17 LAB — ANA: Anti Nuclear Antibody(ANA): NEGATIVE

## 2015-04-17 LAB — SJOGRENS SYNDROME-A EXTRACTABLE NUCLEAR ANTIBODY: SSA (RO) (ENA) ANTIBODY, IGG: NEGATIVE

## 2015-04-19 LAB — ALDOLASE: Aldolase: 6.9 U/L (ref <=8.1)

## 2015-04-24 DIAGNOSIS — J8410 Pulmonary fibrosis, unspecified: Secondary | ICD-10-CM | POA: Diagnosis not present

## 2015-04-24 DIAGNOSIS — J449 Chronic obstructive pulmonary disease, unspecified: Secondary | ICD-10-CM | POA: Diagnosis not present

## 2015-04-24 DIAGNOSIS — G4733 Obstructive sleep apnea (adult) (pediatric): Secondary | ICD-10-CM | POA: Diagnosis not present

## 2015-05-22 ENCOUNTER — Encounter (HOSPITAL_COMMUNITY): Payer: Self-pay

## 2015-05-22 ENCOUNTER — Encounter (HOSPITAL_COMMUNITY)
Admission: RE | Admit: 2015-05-22 | Discharge: 2015-05-22 | Disposition: A | Discharge status: Home / Self Care | Payer: PPO | Source: Ambulatory Visit | Attending: Pulmonary Disease | Admitting: Pulmonary Disease

## 2015-05-22 ENCOUNTER — Telehealth (HOSPITAL_COMMUNITY): Payer: Self-pay | Admitting: *Deleted

## 2015-05-22 VITALS — BP 96/58 | HR 60

## 2015-05-22 DIAGNOSIS — J849 Interstitial pulmonary disease, unspecified: Secondary | ICD-10-CM

## 2015-05-22 NOTE — Progress Notes (Signed)
Dawn Gomez 80 y.o. female Pulmonary Rehab Orientation Note Patient arrived today in [facility] for orientation to Pulmonary Rehab. She was transported from [employer] [facility] via wheel chair. She does carry portable oxygen. Per pt, she uses oxygen continuously. Color good, skin warm and dry. Patient is oriented to time and place. Patient's medical history, psychosocial health, and medications reviewed. Psychosocial assessment reveals pt lives with their son. Pt is currently retired. Pt hobbies include cooking, crochet, bible study, and doing volunteer work with her church.  She calls herself a born again christian and has found much peace from this. Pt reports her stress level is high. Areas of stress/anxiety include Health Family Finances. Her son who lives with her is unemployed and their finances are difficult.  They are looking for housing that is less expensive. Pt does exhibits signs of depression. Signs of depression include helplessness and sadness and difficulty falling asleep. PHQ2/9 score 2/14. Pt shows poor coping skills with negative outlook . I called her primary care physician, Dr. Rachell Cipro to report the signs of depression and spoke with Lattie Haw and was assured they will call patient to have her come to the office to address her depression.   offered emotional support and reassurance. Will continue to monitor and evaluate progress toward psychosocial goal(s) of feeling better about herself by becoming stronger and having more energy. Physical assessment reveals heart rate is normal, breath sounds clear to auscultation, no wheezes, rales, or rhonchi. Grip strength equal, strong. Distal pulses 2+ bilateral posterior tibial pulses present with ankle edema with the left ankle being greater than the right. Patient reports she does take medications as prescribed. Patient states she follows a Low Sodium diet. The patient reports no specific efforts to gain or lose weight.. Patient's  weight will be monitored closely. Demonstration and practice of PLB using pulse oximeter. Patient able to return demonstration satisfactorily. Safety and hand hygiene in the exercise area reviewed with patient. Patient voices understanding of the information reviewed. Department expectations discussed with patient and achievable goals were set. The patient shows enthusiasm about attending the program and we look forward to working with this nice lady. The patient is scheduled for a 6 min walk test on Thursday, February 16 @ 3:45 pm and to begin exercise on Tuesday, May 30, 2015 in the 1:30 pm class. Also her blood pressure was 97/54 and after drinking 12 ounces of gatorade it did not increase and was 96/58, I also addressed her blood pressure with Dr. Ival Bible office to re-evaluate her medicines.  1200-1530

## 2015-05-24 DIAGNOSIS — I10 Essential (primary) hypertension: Secondary | ICD-10-CM | POA: Diagnosis not present

## 2015-05-24 DIAGNOSIS — J439 Emphysema, unspecified: Secondary | ICD-10-CM | POA: Diagnosis not present

## 2015-05-24 DIAGNOSIS — J8410 Pulmonary fibrosis, unspecified: Secondary | ICD-10-CM | POA: Diagnosis not present

## 2015-05-24 DIAGNOSIS — F331 Major depressive disorder, recurrent, moderate: Secondary | ICD-10-CM | POA: Diagnosis not present

## 2015-05-25 ENCOUNTER — Inpatient Hospital Stay (HOSPITAL_COMMUNITY)
Admission: RE | Admit: 2015-05-25 | Discharge: 2015-05-25 | Disposition: A | Discharge status: Home / Self Care | Payer: PPO | Source: Ambulatory Visit

## 2015-05-25 DIAGNOSIS — G4733 Obstructive sleep apnea (adult) (pediatric): Secondary | ICD-10-CM | POA: Diagnosis not present

## 2015-05-25 DIAGNOSIS — J8410 Pulmonary fibrosis, unspecified: Secondary | ICD-10-CM | POA: Diagnosis not present

## 2015-05-25 DIAGNOSIS — J449 Chronic obstructive pulmonary disease, unspecified: Secondary | ICD-10-CM | POA: Diagnosis not present

## 2015-05-25 DIAGNOSIS — J849 Interstitial pulmonary disease, unspecified: Secondary | ICD-10-CM | POA: Diagnosis not present

## 2015-05-25 NOTE — Progress Notes (Signed)
Dawn Gomez completed a Six-Minute Walk Test on 05/25/15 . Dawn Gomez walked 970 feet with 2 rest breaks for a combined total rest time of 42 seconds.  The patient's lowest oxygen saturation was 8 %, in which pt rested. O2 level was increased from 3L to 4L, highest heart rate was 98 bpm, and highest blood pressure was 134/70. The patient was on 4 liters of oxygen with a nasal cannula.     Dawn Gomez [employer]

## 2015-05-30 ENCOUNTER — Encounter (HOSPITAL_COMMUNITY): Payer: PPO

## 2015-05-30 ENCOUNTER — Encounter (HOSPITAL_COMMUNITY)
Admission: RE | Admit: 2015-05-30 | Discharge: 2015-05-30 | Disposition: A | Discharge status: Home / Self Care | Payer: PPO | Source: Ambulatory Visit | Attending: Pulmonary Disease | Admitting: Pulmonary Disease

## 2015-05-30 DIAGNOSIS — J849 Interstitial pulmonary disease, unspecified: Secondary | ICD-10-CM | POA: Diagnosis not present

## 2015-05-30 NOTE — Progress Notes (Signed)
Today, Jael exercised at [employer]. [facility]. Service time was from 1330 to 1500.  The patient exercised by performing aerobic, strengthening, and stretching exercises. Oxygen saturation, heart rate, blood pressure, rate of perceived exertion, and shortness of breath were all monitored before, during, and after exercise. Truth presented with no problems at today's exercise session.  The patient did not have an increase in workload intensity during today's exercise session.  Pre-exercise vitals: . Weight kg: 82.3 . Liters of O2: 2 . SpO2: 92 . HR: 83 . BP: 102/60 . CBG: NA  Exercise vitals: . Highest heartrate:  100 . Lowest oxygen saturation: 92 . Highest blood pressure: 104/60 . Liters of 02: 4  Post-exercise vitals: . SpO2: 96 . HR: 67 . BP: 112/64 . Liters of O2: 4 . CBG: NA Dr. Rush Farmer, Medical Director Dr. Marily Memos is immediately available during today's Pulmonary Rehab session for CHAUNDA VANDERGRIFF on 05/30/2015  at 1330 class time  .

## 2015-06-01 ENCOUNTER — Encounter (HOSPITAL_COMMUNITY)
Admission: RE | Admit: 2015-06-01 | Discharge: 2015-06-01 | Disposition: A | Discharge status: Home / Self Care | Payer: PPO | Source: Ambulatory Visit | Attending: Pulmonary Disease | Admitting: Pulmonary Disease

## 2015-06-01 DIAGNOSIS — J849 Interstitial pulmonary disease, unspecified: Secondary | ICD-10-CM | POA: Diagnosis not present

## 2015-06-01 NOTE — Progress Notes (Signed)
Today, Dawn Gomez exercised at [employer]. [facility]. Service time was from 1330 to 1525.  The patient exercised by performing aerobic, strengthening, and stretching exercises. Oxygen saturation, heart rate, blood pressure, rate of perceived exertion, and shortness of breath were all monitored before, during, and after exercise. [name] presented with no problems at today's exercise session.She attended exercise for the  Pulmonary patient class today.  The patient did not have an increase in workload intensity during today's exercise session.  Pre-exercise vitals: . Weight kg: 82.7 . Liters of O2: 2 . SpO2: 91 . HR: 833 . BP: 102/60 . CBG: NA  Exercise vitals: . Highest heartrate:  87 . Lowest oxygen saturation: 86 . Highest blood pressure: 112/62 . Liters of 02: 4  Post-exercise vitals: . SpO2: 98 . HR: 80 . BP: 96/58 recheck after drinking water recheck BP 110/68 . Liters of O2: 4 . CBG: NA Dr. Rush Farmer, Medical Director Dr. Waldron Labs is immediately available during today's Pulmonary Rehab session for KECIA SWOBODA on 06/01/2015  at 1330 class time.

## 2015-06-06 ENCOUNTER — Encounter (HOSPITAL_COMMUNITY)
Admission: RE | Admit: 2015-06-06 | Discharge: 2015-06-06 | Disposition: A | Discharge status: Home / Self Care | Payer: PPO | Source: Ambulatory Visit | Attending: Pulmonary Disease | Admitting: Pulmonary Disease

## 2015-06-06 DIAGNOSIS — J849 Interstitial pulmonary disease, unspecified: Secondary | ICD-10-CM | POA: Diagnosis not present

## 2015-06-06 NOTE — Progress Notes (Addendum)
Daily Session Note  Patient Details  Name: Dawn Gomez MRN: [id number] Date of Birth: July 28, 1935 Referring Provider:  Fanny Bien, MD  Encounter Date: 06/06/2015  Check In:     Session Check In - 06/06/15 1335    Check-In   Location [facility]   Staff Present Seward Carol, MS, ACSM CEP, Exercise Physiologist;Abdalla Naramore Leonia Reeves, RN, BSN;Ramon Dredge, RN, MHA;Portia Rollene Rotunda, RN, BSN   Supervising physician immediately available to respond to emergencies [facility] immediately available   Physician(s) Dr. Marily Memos   Medication changes reported     No   Fall or balance concerns reported    No   Warm-up and Cool-down Performed as group-led instruction   Resistance Training Performed Yes   VAD Patient? No   Pain Assessment   Currently in Pain? No/denies      Capillary Blood Glucose: No results found for this or any previous visit (from the past 24 hour(s)).      Exercise Prescription Changes - 06/06/15 1500    Exercise Review   Progression No   Response to Exercise   Blood Pressure (Admit) 100/60 mmHg   Blood Pressure (Exercise) 110/50 mmHg   Blood Pressure (Exit) 143/70 mmHg   Heart Rate (Admit) 83 bpm   Heart Rate (Exercise) 107 bpm   Heart Rate (Exit) 71 bpm   Oxygen Saturation (Admit) 93 %   Oxygen Saturation (Exercise) 85 %   Oxygen Saturation (Exit) 96 %   Rating of Perceived Exertion (Exercise) 15   Perceived Dyspnea (Exercise) 1   Symptoms none   Comments none   Duration Progress to 45 minutes of aerobic exercise without signs/symptoms of physical distress   Intensity Other (comment)  40-80% HRR   Progression   Progression Continue progressive overload as per policy without signs/symptoms or physical distress.   Resistance Training   Training Prescription Yes   Weight bands   Reps 10-12   Interval Training   Interval Training No   Oxygen   Oxygen Continuous   Liters 4   Bike   Level 0.5   Minutes 15   NuStep   Level 2   Minutes 15   METs 1.6   Track   Laps 12   Minutes 15     Goals Met:  Proper associated with RPD/PD & O2 Sat Exercise tolerated well Queuing for purse lip breathing  Goals Unmet:  Not Applicable  Comments:  Arrival time 1330 Departure time 89 Dr. Rush Farmer is Medical Director for Pulmonary Rehab at [facility].

## 2015-06-08 ENCOUNTER — Encounter (HOSPITAL_COMMUNITY)
Admission: RE | Admit: 2015-06-08 | Discharge: 2015-06-08 | Disposition: A | Discharge status: Home / Self Care | Payer: PPO | Source: Ambulatory Visit | Attending: Pulmonary Disease | Admitting: Pulmonary Disease

## 2015-06-08 DIAGNOSIS — J849 Interstitial pulmonary disease, unspecified: Secondary | ICD-10-CM | POA: Diagnosis not present

## 2015-06-08 NOTE — Progress Notes (Signed)
Daily Session Note  Patient Details  Name: Dawn Gomez MRN: [id number] Date of Birth: [birth year] Referring Provider:  Fanny Bien, MD  Encounter Date: 06/08/2015  Check In:     Session Check In - 06/08/15 1419    Check-In   Location [facility]   Staff Present Rosebud Poles, RN, BSN;Lisa Ysidro Evert, Felipe Drone, RN, MHA;Jessica Luan Pulling, MA, ACSM RCEP, Exercise Physiologist   Supervising physician immediately available to respond to emergencies [facility] immediately available   Physician(s) Dr. Marily Memos   Medication changes reported     No   Fall or balance concerns reported    No   Warm-up and Cool-down Performed as group-led instruction   Resistance Training Performed Yes   VAD Patient? No   Pain Assessment   Currently in Pain? No/denies   Multiple Pain Sites No      Capillary Blood Glucose: No results found for this or any previous visit (from the past 24 hour(s)).      Exercise Prescription Changes - 06/08/15 1600    Exercise Review   Progression No   Response to Exercise   Blood Pressure (Admit) 112/66 mmHg   Blood Pressure (Exercise) 132/70 mmHg   Blood Pressure (Exit) 102/50 mmHg   Heart Rate (Admit) 75 bpm   Heart Rate (Exercise) 100 bpm   Heart Rate (Exit) 78 bpm   Oxygen Saturation (Admit) 94 %   Oxygen Saturation (Exercise) 90 %   Oxygen Saturation (Exit) 94 %   Rating of Perceived Exertion (Exercise) 13   Perceived Dyspnea (Exercise) 1   Symptoms none   Comments none   Duration Progress to 45 minutes of aerobic exercise without signs/symptoms of physical distress   Intensity Other (comment)   Progression   Progression --  40-80% HRR   Resistance Training   Training Prescription Yes   Weight orange bands   Reps 10-12   Interval Training   Interval Training No   Oxygen   Oxygen Continuous   Liters 2-3   Bike   Level 0.5   Minutes 15   Track   Laps 12   Minutes 15     Goals Met:  Proper associated  with RPD/PD & O2 Sat Exercise tolerated well Queuing for purse lip breathing Strength training completed today  Goals Unmet:  Not Applicable  Comments: Service time is from 1330 to 1530    Dr. Rush Farmer is Medical Director for Pulmonary Rehab at [facility].

## 2015-06-13 ENCOUNTER — Encounter (HOSPITAL_COMMUNITY): Payer: PPO

## 2015-06-13 DIAGNOSIS — J8410 Pulmonary fibrosis, unspecified: Secondary | ICD-10-CM | POA: Diagnosis not present

## 2015-06-13 DIAGNOSIS — S39011A Strain of muscle, fascia and tendon of abdomen, initial encounter: Secondary | ICD-10-CM | POA: Diagnosis not present

## 2015-06-15 ENCOUNTER — Telehealth (HOSPITAL_COMMUNITY): Payer: Self-pay | Admitting: *Deleted

## 2015-06-15 ENCOUNTER — Encounter (HOSPITAL_COMMUNITY): Payer: PPO

## 2015-06-20 ENCOUNTER — Encounter (HOSPITAL_COMMUNITY): Payer: PPO

## 2015-06-22 ENCOUNTER — Encounter (HOSPITAL_COMMUNITY): Admission: RE | Admit: 2015-06-22 | Payer: PPO | Source: Ambulatory Visit

## 2015-06-22 DIAGNOSIS — G4733 Obstructive sleep apnea (adult) (pediatric): Secondary | ICD-10-CM | POA: Diagnosis not present

## 2015-06-22 DIAGNOSIS — J449 Chronic obstructive pulmonary disease, unspecified: Secondary | ICD-10-CM | POA: Diagnosis not present

## 2015-06-22 DIAGNOSIS — J8410 Pulmonary fibrosis, unspecified: Secondary | ICD-10-CM | POA: Diagnosis not present

## 2015-06-26 DIAGNOSIS — E039 Hypothyroidism, unspecified: Secondary | ICD-10-CM | POA: Diagnosis not present

## 2015-06-26 DIAGNOSIS — I10 Essential (primary) hypertension: Secondary | ICD-10-CM | POA: Diagnosis not present

## 2015-06-27 ENCOUNTER — Encounter (HOSPITAL_COMMUNITY): Payer: PPO

## 2015-06-29 ENCOUNTER — Encounter (HOSPITAL_COMMUNITY): Payer: PPO

## 2015-06-29 DIAGNOSIS — F411 Generalized anxiety disorder: Secondary | ICD-10-CM | POA: Diagnosis not present

## 2015-06-29 DIAGNOSIS — E782 Mixed hyperlipidemia: Secondary | ICD-10-CM | POA: Diagnosis not present

## 2015-06-29 DIAGNOSIS — J8410 Pulmonary fibrosis, unspecified: Secondary | ICD-10-CM | POA: Diagnosis not present

## 2015-06-29 DIAGNOSIS — I10 Essential (primary) hypertension: Secondary | ICD-10-CM | POA: Diagnosis not present

## 2015-07-04 ENCOUNTER — Encounter (HOSPITAL_COMMUNITY): Admission: RE | Admit: 2015-07-04 | Payer: PPO | Source: Ambulatory Visit

## 2015-07-06 ENCOUNTER — Encounter (HOSPITAL_COMMUNITY): Payer: PPO

## 2015-07-10 ENCOUNTER — Telehealth (HOSPITAL_COMMUNITY): Payer: Self-pay | Admitting: *Deleted

## 2015-07-11 ENCOUNTER — Encounter (HOSPITAL_COMMUNITY): Payer: PPO

## 2015-07-13 ENCOUNTER — Telehealth (HOSPITAL_COMMUNITY): Payer: Self-pay | Admitting: *Deleted

## 2015-07-13 ENCOUNTER — Encounter (HOSPITAL_COMMUNITY): Payer: PPO

## 2015-07-18 ENCOUNTER — Encounter (HOSPITAL_COMMUNITY): Payer: PPO

## 2015-07-20 ENCOUNTER — Encounter (HOSPITAL_COMMUNITY): Payer: PPO | Attending: Pulmonary Disease

## 2015-07-20 DIAGNOSIS — J849 Interstitial pulmonary disease, unspecified: Secondary | ICD-10-CM | POA: Insufficient documentation

## 2015-07-23 DIAGNOSIS — G4733 Obstructive sleep apnea (adult) (pediatric): Secondary | ICD-10-CM | POA: Diagnosis not present

## 2015-07-23 DIAGNOSIS — J8410 Pulmonary fibrosis, unspecified: Secondary | ICD-10-CM | POA: Diagnosis not present

## 2015-07-23 DIAGNOSIS — J449 Chronic obstructive pulmonary disease, unspecified: Secondary | ICD-10-CM | POA: Diagnosis not present

## 2015-07-24 ENCOUNTER — Telehealth (HOSPITAL_COMMUNITY): Payer: Self-pay | Admitting: *Deleted

## 2015-07-25 ENCOUNTER — Encounter (HOSPITAL_COMMUNITY): Payer: PPO

## 2015-07-27 ENCOUNTER — Ambulatory Visit: Payer: PPO

## 2015-07-27 ENCOUNTER — Encounter (HOSPITAL_COMMUNITY): Admission: RE | Admit: 2015-07-27 | Payer: PPO | Source: Ambulatory Visit

## 2015-07-27 ENCOUNTER — Ambulatory Visit: Payer: PPO | Admitting: Pulmonary Disease

## 2015-08-01 ENCOUNTER — Encounter (HOSPITAL_COMMUNITY): Payer: PPO

## 2015-08-03 ENCOUNTER — Encounter (HOSPITAL_COMMUNITY): Payer: Self-pay | Admitting: *Deleted

## 2015-08-03 ENCOUNTER — Encounter (HOSPITAL_COMMUNITY): Payer: PPO

## 2015-08-03 NOTE — Progress Notes (Signed)
Pulmonary Rehab Discharge Note  Dawn Gomez has been discharged early from pulmonary rehab due to a torn diaphragm muscle and now a recent fall which injured her hip and knee.She was only able to participate in 4 exercise sessions and did not meet the program goals or her personal goals which were to increase her strength, increase her energy, and to maintain her lung disease.  She was reminded when she recovers from her recent injuries to ask Dr. Lake Bells to reorder pulmonary rehab to try again to finish the program.

## 2015-08-08 ENCOUNTER — Encounter (HOSPITAL_COMMUNITY): Payer: PPO

## 2015-08-10 ENCOUNTER — Encounter (HOSPITAL_COMMUNITY): Payer: PPO

## 2015-08-14 DIAGNOSIS — Z0000 Encounter for general adult medical examination without abnormal findings: Secondary | ICD-10-CM | POA: Diagnosis not present

## 2015-08-14 DIAGNOSIS — I10 Essential (primary) hypertension: Secondary | ICD-10-CM | POA: Diagnosis not present

## 2015-08-14 DIAGNOSIS — E785 Hyperlipidemia, unspecified: Secondary | ICD-10-CM | POA: Diagnosis not present

## 2015-08-15 ENCOUNTER — Encounter (HOSPITAL_COMMUNITY): Payer: PPO

## 2015-08-16 DIAGNOSIS — E119 Type 2 diabetes mellitus without complications: Secondary | ICD-10-CM | POA: Diagnosis not present

## 2015-08-16 DIAGNOSIS — E785 Hyperlipidemia, unspecified: Secondary | ICD-10-CM | POA: Diagnosis not present

## 2015-08-16 DIAGNOSIS — I10 Essential (primary) hypertension: Secondary | ICD-10-CM | POA: Diagnosis not present

## 2015-08-16 DIAGNOSIS — F331 Major depressive disorder, recurrent, moderate: Secondary | ICD-10-CM | POA: Diagnosis not present

## 2015-08-17 ENCOUNTER — Encounter (HOSPITAL_COMMUNITY): Payer: PPO

## 2015-08-22 ENCOUNTER — Encounter (HOSPITAL_COMMUNITY): Payer: PPO

## 2015-08-22 DIAGNOSIS — J449 Chronic obstructive pulmonary disease, unspecified: Secondary | ICD-10-CM | POA: Diagnosis not present

## 2015-08-22 DIAGNOSIS — J8410 Pulmonary fibrosis, unspecified: Secondary | ICD-10-CM | POA: Diagnosis not present

## 2015-08-22 DIAGNOSIS — G4733 Obstructive sleep apnea (adult) (pediatric): Secondary | ICD-10-CM | POA: Diagnosis not present

## 2015-08-24 ENCOUNTER — Encounter (HOSPITAL_COMMUNITY): Payer: PPO

## 2015-08-29 ENCOUNTER — Encounter (HOSPITAL_COMMUNITY): Payer: PPO

## 2015-08-31 ENCOUNTER — Encounter (HOSPITAL_COMMUNITY): Payer: PPO

## 2015-09-05 ENCOUNTER — Encounter (HOSPITAL_COMMUNITY): Payer: PPO

## 2015-09-07 ENCOUNTER — Encounter (HOSPITAL_COMMUNITY): Payer: PPO

## 2015-09-22 DIAGNOSIS — G4733 Obstructive sleep apnea (adult) (pediatric): Secondary | ICD-10-CM | POA: Diagnosis not present

## 2015-09-22 DIAGNOSIS — J449 Chronic obstructive pulmonary disease, unspecified: Secondary | ICD-10-CM | POA: Diagnosis not present

## 2015-09-22 DIAGNOSIS — J8410 Pulmonary fibrosis, unspecified: Secondary | ICD-10-CM | POA: Diagnosis not present

## 2015-10-03 DIAGNOSIS — F331 Major depressive disorder, recurrent, moderate: Secondary | ICD-10-CM | POA: Diagnosis not present

## 2015-10-03 DIAGNOSIS — J8410 Pulmonary fibrosis, unspecified: Secondary | ICD-10-CM | POA: Diagnosis not present

## 2015-10-03 DIAGNOSIS — Z23 Encounter for immunization: Secondary | ICD-10-CM | POA: Diagnosis not present

## 2015-10-03 DIAGNOSIS — I10 Essential (primary) hypertension: Secondary | ICD-10-CM | POA: Diagnosis not present

## 2015-10-03 DIAGNOSIS — F411 Generalized anxiety disorder: Secondary | ICD-10-CM | POA: Diagnosis not present

## 2015-10-13 ENCOUNTER — Encounter: Payer: Self-pay | Admitting: Pulmonary Disease

## 2015-10-13 ENCOUNTER — Ambulatory Visit (INDEPENDENT_AMBULATORY_CARE_PROVIDER_SITE_OTHER): Payer: PPO | Admitting: Pulmonary Disease

## 2015-10-13 ENCOUNTER — Other Ambulatory Visit: Payer: Self-pay | Admitting: Pulmonary Disease

## 2015-10-13 ENCOUNTER — Ambulatory Visit: Payer: PPO

## 2015-10-13 VITALS — BP 132/68 | HR 77 | Ht 60.0 in | Wt 174.0 lb

## 2015-10-13 DIAGNOSIS — J849 Interstitial pulmonary disease, unspecified: Secondary | ICD-10-CM | POA: Diagnosis not present

## 2015-10-13 DIAGNOSIS — R0600 Dyspnea, unspecified: Secondary | ICD-10-CM

## 2015-10-13 DIAGNOSIS — J9611 Chronic respiratory failure with hypoxia: Secondary | ICD-10-CM | POA: Diagnosis not present

## 2015-10-13 LAB — PULMONARY FUNCTION TEST
DL/VA % pred: 48 %
DL/VA: 2.07 ml/min/mmHg/L
DLCO COR % PRED: 29 %
DLCO UNC: 5.89 ml/min/mmHg
DLCO cor: 5.56 ml/min/mmHg
DLCO unc % pred: 31 %
FEF 25-75 POST: 2.26 L/s
FEF 25-75 Pre: 2.13 L/sec
FEF2575-%Change-Post: 6 %
FEF2575-%PRED-POST: 172 %
FEF2575-%Pred-Pre: 162 %
FEV1-%CHANGE-POST: 0 %
FEV1-%Pred-Post: 91 %
FEV1-%Pred-Pre: 91 %
FEV1-PRE: 1.52 L
FEV1-Post: 1.53 L
FEV1FVC-%CHANGE-POST: 3 %
FEV1FVC-%Pred-Pre: 115 %
FEV6-%Change-Post: -3 %
FEV6-%PRED-PRE: 84 %
FEV6-%Pred-Post: 81 %
FEV6-POST: 1.72 L
FEV6-Pre: 1.79 L
FEV6FVC-%PRED-POST: 106 %
FEV6FVC-%Pred-Pre: 106 %
FVC-%CHANGE-POST: -3 %
FVC-%PRED-POST: 76 %
FVC-%PRED-PRE: 79 %
FVC-POST: 1.72 L
FVC-PRE: 1.79 L
POST FEV6/FVC RATIO: 100 %
PRE FEV1/FVC RATIO: 85 %
PRE FEV6/FVC RATIO: 100 %
Post FEV1/FVC ratio: 89 %
RV % PRED: 54 %
RV: 1.17 L
TLC % PRED: 69 %
TLC: 3.08 L

## 2015-10-13 NOTE — Assessment & Plan Note (Signed)
She continues to use and benefit from 2 L O2 continuously. She needs to continue using this.

## 2015-10-13 NOTE — Progress Notes (Signed)
Subjective:    Patient ID: Dawn Gomez, female    DOB: Oct 01, 1935, 80 y.o.   MRN: [id number] Synopsis:  Former patient of Dr. Gwenette Greet has pulmonary fibrosis of uncertain etiology. Dr. Gwenette Greet summarized her clinical situation as follows: LABS 2009:  ANA, RF, ACE, ESR, all ok.  PFT's 10/2010:  No obstruction, TLC 3.05 (73% pred), DLCO 6.1 (31%) CXR 10/2010:  Stable interstitial changes. Felt to have RBILD vs ?IPF? CXR 2013:  Stable IS changes PFT's 2013:  No obstruction, TLC 3.05 (73%), DLCO 6.3 (31%) ONO on RA with cpap 06/2012:  Low sat 85%, only 7 min less than 88% entire night.  Echo 06/2012:  Normal LV, DD, normal RV, no evidence for pulmonary htn.  CT chest 2014:  Subpleural reticulation, ?early honeycombing, per chest radiology >> no definite GGO, may be UIP or maybe not.  PFT's 03/2013:  FEV1 1.52 (87%), ratio 88, FVC 1.73 (74%), TLC 2.80 (62%), DLCO 7.22 (38%) Ambul ox 07/2013:  desat to 87% transiently.  Pt would like to hold off on portable oxygen  PFT's 01/2014:  FEV1 1.54 (90%), ratio 87, FVC 1.78 (76%), TLC 2.90 (65%), DLCO 6.22 (32%) Autoimmune 01/2014:  Negative HRCT 01/2014:  Slight progression of ISLD, and pattern suggestive of UIP Can't afford pulmonary rehab 07/2014 discussed anti-fibrotic agents.  Pt wishes to wait until re-evaluation in October.  03/2015 CT chest , stable fibrosis since 2014  04/2015 ANA, RF, CCP, JO-1, SSA, SSB, SCL-70, Anti-centromere all negative 04/2015 PFT> FVC 1.67 L (73% predicted), total lung capacity 2.76 L (62% predicted), DLCO 6.50 (34% predicted). July 2017 pulmonary function testing ratio 89%, FVC 1.72 L 76% predicted, total lung capacity 3.08 L, 69% predicted, DLCO 5.89 31% predicte  HPI Chief Complaint  Patient presents with  . Follow-up    review PFT.  pt has had increased SOB- did not have air conditioning in house X1 week, which has worsened s/s.     Dawn Gomez has been struggling with increasing dyspnea since the last visit because of  her airconditioning being out.  She had a company come out to fix it today.   She plans to move out of her house to avoid this issue from happening again.   She has found that she has been "mouth breahting" more lately when exerting herself. She has not been coughing more than usual, only occasionally.  No mucus production except every now and then.   She feels that her breathing problems lately have just been due to the heat and lack of air conditioning.  Now that the [facility] is fixed her breathing is better.  Past Medical History  Diagnosis Date  . OSA (obstructive sleep apnea)     on cpap  . Abnormal heart rhythm   . Dyspnea   . Hypothyroid      Review of Systems  Constitutional: Positive for fatigue. Negative for fever and chills.  HENT: Negative for postnasal drip, rhinorrhea and sinus pressure.   Respiratory: Positive for shortness of breath. Negative for cough and wheezing.   Cardiovascular: Negative for chest pain, palpitations and leg swelling.       Objective:   Physical Exam Filed Vitals:   10/13/15 1215  BP: 132/68  Pulse: 77  Height: 5' (1.524 m)  Weight: 174 lb (78.926 kg)  SpO2: 94%   2L [place]  Gen: chronically ill appearing HENT: OP clear, TM's clear, neck supple PULM: Crackles bases B, normal percussion CV: RRR, no mgr, trace edema GI: BS+,  soft, nontender Derm: no cyanosis or rash Psyche: normal mood and affect      Assessment & Plan:  Interstitial lung disease ([place]) THOUGH WE DON'T KNOW WHAT TYPE OF PULMONARY FIBROSIS Dawn Gomez HAS, IT SEEMS TO BE STABLE RECENTLY AS HER PFT TODAY IS ACTUALLY BETTER.  THIS IS NOT ACTING LIKE TYPICAL IPF, SO I STILL DON'T FEEL THAT TREATING WITH ANTIFIBROTIC THERAPY MAKES ANY SENSE.  PLAN: CONTINUE EXERCISE REGULARLY CONTINUE OXYGEN THERAPY TITRATED TO MAINTAIN O2 [place] > 90% F/U 3 MONTHS, PFT 6 MONTHS  Chronic respiratory failure ([place]) She continues to use and benefit from 2 L O2 continuously. She needs to continue  using this.  > 50% of time spent face to face in a 45 minute visit   Current outpatient prescriptions:  .  acetaminophen (TYLENOL) 500 MG tablet, Take 500 mg by mouth every 6 (six) hours as needed., Disp: , Rfl:  .  aspirin 81 MG tablet, Take 81 mg by mouth daily., Disp: , Rfl:  .  atorvastatin (LIPITOR) 10 MG tablet, Take 10 mg by mouth daily., Disp: , Rfl:  .  calcium & magnesium carbonates (MYLANTA) 311-232 MG tablet, Take 1 tablet by mouth daily., Disp: , Rfl:  .  diltiazem (CARDIZEM CD) 180 MG 24 hr capsule, Take 120 mg by mouth daily. , Disp: , Rfl:  .  diphenhydrAMINE (BENADRYL) 25 MG tablet, Take 25 mg by mouth as needed., Disp: , Rfl:  .  escitalopram (LEXAPRO) 20 MG tablet, Take 20 mg by mouth daily., Disp: , Rfl:  .  fenofibrate (TRICOR) 145 MG tablet, Take 145 mg by mouth daily., Disp: , Rfl:  .  furosemide (LASIX) 20 MG tablet, Take 40 mg by mouth daily. , Disp: , Rfl:  .  levothyroxine (SYNTHROID, LEVOTHROID) 100 MCG tablet, Take 100 mcg by mouth daily.  , Disp: , Rfl:  .  lisinopril (PRINIVIL,ZESTRIL) 5 MG tablet, Once daily, Disp: , Rfl:  .  potassium chloride SA (K-DUR,KLOR-CON) 20 MEQ tablet, Take 20 mEq by mouth daily. , Disp: , Rfl:  .  Vitamin D, Ergocalciferol, (DRISDOL) 50000 units CAPS capsule, Take 5,000 Units by mouth., Disp: , Rfl:

## 2015-10-13 NOTE — Assessment & Plan Note (Signed)
THOUGH WE DON'T KNOW WHAT TYPE OF PULMONARY FIBROSIS Dawn Gomez HAS, IT SEEMS TO BE STABLE RECENTLY AS HER PFT TODAY IS ACTUALLY BETTER.  THIS IS NOT ACTING LIKE TYPICAL IPF, SO I STILL DON'T FEEL THAT TREATING WITH ANTIFIBROTIC THERAPY MAKES ANY SENSE.  PLAN: CONTINUE EXERCISE REGULARLY CONTINUE OXYGEN THERAPY TITRATED TO MAINTAIN O2 [place] > 90% F/U 3 MONTHS, PFT 6 MONTHS

## 2015-10-13 NOTE — Patient Instructions (Signed)
I would like for you to go back to pulmonary rehab Keep using your oxygen as you are doing We will see you back in 3 months or sooner if needed

## 2015-10-13 NOTE — Progress Notes (Signed)
PFT done today. 

## 2015-10-22 DIAGNOSIS — J449 Chronic obstructive pulmonary disease, unspecified: Secondary | ICD-10-CM | POA: Diagnosis not present

## 2015-10-22 DIAGNOSIS — G4733 Obstructive sleep apnea (adult) (pediatric): Secondary | ICD-10-CM | POA: Diagnosis not present

## 2015-10-22 DIAGNOSIS — J8410 Pulmonary fibrosis, unspecified: Secondary | ICD-10-CM | POA: Diagnosis not present

## 2015-11-22 DIAGNOSIS — J449 Chronic obstructive pulmonary disease, unspecified: Secondary | ICD-10-CM | POA: Diagnosis not present

## 2015-11-22 DIAGNOSIS — G4733 Obstructive sleep apnea (adult) (pediatric): Secondary | ICD-10-CM | POA: Diagnosis not present

## 2015-11-22 DIAGNOSIS — J8410 Pulmonary fibrosis, unspecified: Secondary | ICD-10-CM | POA: Diagnosis not present

## 2015-12-23 DIAGNOSIS — J449 Chronic obstructive pulmonary disease, unspecified: Secondary | ICD-10-CM | POA: Diagnosis not present

## 2015-12-23 DIAGNOSIS — G4733 Obstructive sleep apnea (adult) (pediatric): Secondary | ICD-10-CM | POA: Diagnosis not present

## 2015-12-23 DIAGNOSIS — J8410 Pulmonary fibrosis, unspecified: Secondary | ICD-10-CM | POA: Diagnosis not present

## 2016-01-09 DIAGNOSIS — I10 Essential (primary) hypertension: Secondary | ICD-10-CM | POA: Diagnosis not present

## 2016-01-09 DIAGNOSIS — Z0000 Encounter for general adult medical examination without abnormal findings: Secondary | ICD-10-CM | POA: Diagnosis not present

## 2016-01-11 ENCOUNTER — Other Ambulatory Visit: Payer: Self-pay | Admitting: Family Medicine

## 2016-01-11 DIAGNOSIS — I10 Essential (primary) hypertension: Secondary | ICD-10-CM | POA: Diagnosis not present

## 2016-01-11 DIAGNOSIS — Z23 Encounter for immunization: Secondary | ICD-10-CM | POA: Diagnosis not present

## 2016-01-11 DIAGNOSIS — E119 Type 2 diabetes mellitus without complications: Secondary | ICD-10-CM | POA: Diagnosis not present

## 2016-01-11 DIAGNOSIS — E039 Hypothyroidism, unspecified: Secondary | ICD-10-CM | POA: Diagnosis not present

## 2016-01-11 DIAGNOSIS — Z1231 Encounter for screening mammogram for malignant neoplasm of breast: Secondary | ICD-10-CM

## 2016-01-11 DIAGNOSIS — Z0000 Encounter for general adult medical examination without abnormal findings: Secondary | ICD-10-CM | POA: Diagnosis not present

## 2016-01-11 DIAGNOSIS — E782 Mixed hyperlipidemia: Secondary | ICD-10-CM | POA: Diagnosis not present

## 2016-01-11 DIAGNOSIS — Z1211 Encounter for screening for malignant neoplasm of colon: Secondary | ICD-10-CM | POA: Diagnosis not present

## 2016-01-15 ENCOUNTER — Ambulatory Visit: Payer: PPO | Admitting: Pulmonary Disease

## 2016-01-22 ENCOUNTER — Ambulatory Visit: Payer: PPO

## 2016-01-22 DIAGNOSIS — G4733 Obstructive sleep apnea (adult) (pediatric): Secondary | ICD-10-CM | POA: Diagnosis not present

## 2016-01-22 DIAGNOSIS — J8410 Pulmonary fibrosis, unspecified: Secondary | ICD-10-CM | POA: Diagnosis not present

## 2016-01-22 DIAGNOSIS — J449 Chronic obstructive pulmonary disease, unspecified: Secondary | ICD-10-CM | POA: Diagnosis not present

## 2016-01-31 ENCOUNTER — Ambulatory Visit: Payer: PPO

## 2016-02-22 DIAGNOSIS — G4733 Obstructive sleep apnea (adult) (pediatric): Secondary | ICD-10-CM | POA: Diagnosis not present

## 2016-02-22 DIAGNOSIS — J449 Chronic obstructive pulmonary disease, unspecified: Secondary | ICD-10-CM | POA: Diagnosis not present

## 2016-02-22 DIAGNOSIS — J8410 Pulmonary fibrosis, unspecified: Secondary | ICD-10-CM | POA: Diagnosis not present

## 2016-02-27 ENCOUNTER — Inpatient Hospital Stay: Admission: RE | Admit: 2016-02-27 | Payer: PPO | Source: Ambulatory Visit

## 2016-03-05 ENCOUNTER — Ambulatory Visit: Payer: PPO | Admitting: Pulmonary Disease

## 2016-03-11 DIAGNOSIS — E039 Hypothyroidism, unspecified: Secondary | ICD-10-CM | POA: Diagnosis not present

## 2016-03-14 DIAGNOSIS — Z6832 Body mass index (BMI) 32.0-32.9, adult: Secondary | ICD-10-CM | POA: Diagnosis not present

## 2016-03-14 DIAGNOSIS — E039 Hypothyroidism, unspecified: Secondary | ICD-10-CM | POA: Diagnosis not present

## 2016-03-14 DIAGNOSIS — I10 Essential (primary) hypertension: Secondary | ICD-10-CM | POA: Diagnosis not present

## 2016-03-14 DIAGNOSIS — J439 Emphysema, unspecified: Secondary | ICD-10-CM | POA: Diagnosis not present

## 2016-03-19 ENCOUNTER — Ambulatory Visit
Admission: RE | Admit: 2016-03-19 | Discharge: 2016-03-19 | Disposition: A | Discharge status: Home / Self Care | Payer: PPO | Source: Ambulatory Visit | Attending: Family Medicine | Admitting: Family Medicine

## 2016-03-19 DIAGNOSIS — Z1231 Encounter for screening mammogram for malignant neoplasm of breast: Secondary | ICD-10-CM

## 2016-03-21 ENCOUNTER — Ambulatory Visit: Payer: PPO

## 2016-03-23 DIAGNOSIS — G4733 Obstructive sleep apnea (adult) (pediatric): Secondary | ICD-10-CM | POA: Diagnosis not present

## 2016-03-23 DIAGNOSIS — J449 Chronic obstructive pulmonary disease, unspecified: Secondary | ICD-10-CM | POA: Diagnosis not present

## 2016-03-23 DIAGNOSIS — J8410 Pulmonary fibrosis, unspecified: Secondary | ICD-10-CM | POA: Diagnosis not present

## 2016-04-11 DIAGNOSIS — E119 Type 2 diabetes mellitus without complications: Secondary | ICD-10-CM | POA: Diagnosis not present

## 2016-04-11 DIAGNOSIS — H3589 Other specified retinal disorders: Secondary | ICD-10-CM | POA: Diagnosis not present

## 2016-04-11 DIAGNOSIS — H1851 Endothelial corneal dystrophy: Secondary | ICD-10-CM | POA: Diagnosis not present

## 2016-04-11 DIAGNOSIS — H26493 Other secondary cataract, bilateral: Secondary | ICD-10-CM | POA: Diagnosis not present

## 2016-04-11 DIAGNOSIS — D3132 Benign neoplasm of left choroid: Secondary | ICD-10-CM | POA: Diagnosis not present

## 2016-04-23 DIAGNOSIS — J8410 Pulmonary fibrosis, unspecified: Secondary | ICD-10-CM | POA: Diagnosis not present

## 2016-05-02 ENCOUNTER — Ambulatory Visit: Payer: PPO | Admitting: Pulmonary Disease

## 2016-05-24 DIAGNOSIS — J8410 Pulmonary fibrosis, unspecified: Secondary | ICD-10-CM | POA: Diagnosis not present

## 2016-05-29 ENCOUNTER — Ambulatory Visit: Payer: PPO | Admitting: Pulmonary Disease

## 2016-06-10 ENCOUNTER — Ambulatory Visit: Payer: PPO | Admitting: Family

## 2016-06-21 DIAGNOSIS — J8410 Pulmonary fibrosis, unspecified: Secondary | ICD-10-CM | POA: Diagnosis not present

## 2016-06-25 DIAGNOSIS — R0602 Shortness of breath: Secondary | ICD-10-CM | POA: Diagnosis not present

## 2016-06-25 DIAGNOSIS — J8410 Pulmonary fibrosis, unspecified: Secondary | ICD-10-CM | POA: Diagnosis not present

## 2016-06-25 DIAGNOSIS — I2781 Cor pulmonale (chronic): Secondary | ICD-10-CM | POA: Diagnosis not present

## 2016-06-25 DIAGNOSIS — Z8679 Personal history of other diseases of the circulatory system: Secondary | ICD-10-CM | POA: Diagnosis not present

## 2016-07-01 ENCOUNTER — Ambulatory Visit: Payer: PPO | Admitting: Family

## 2016-07-12 ENCOUNTER — Ambulatory Visit (INDEPENDENT_AMBULATORY_CARE_PROVIDER_SITE_OTHER): Payer: Medicare Other | Admitting: Family

## 2016-07-12 ENCOUNTER — Encounter: Payer: Self-pay | Admitting: Family

## 2016-07-12 ENCOUNTER — Other Ambulatory Visit (INDEPENDENT_AMBULATORY_CARE_PROVIDER_SITE_OTHER): Payer: Medicare Other

## 2016-07-12 VITALS — BP 116/64 | HR 58 | Temp 98.2°F | Resp 14 | Ht 60.0 in | Wt 167.0 lb

## 2016-07-12 DIAGNOSIS — E785 Hyperlipidemia, unspecified: Secondary | ICD-10-CM | POA: Insufficient documentation

## 2016-07-12 DIAGNOSIS — E038 Other specified hypothyroidism: Secondary | ICD-10-CM

## 2016-07-12 DIAGNOSIS — J9611 Chronic respiratory failure with hypoxia: Secondary | ICD-10-CM | POA: Diagnosis not present

## 2016-07-12 DIAGNOSIS — I471 Supraventricular tachycardia, unspecified: Secondary | ICD-10-CM

## 2016-07-12 DIAGNOSIS — E782 Mixed hyperlipidemia: Secondary | ICD-10-CM | POA: Diagnosis not present

## 2016-07-12 DIAGNOSIS — F3341 Major depressive disorder, recurrent, in partial remission: Secondary | ICD-10-CM

## 2016-07-12 DIAGNOSIS — F329 Major depressive disorder, single episode, unspecified: Secondary | ICD-10-CM | POA: Insufficient documentation

## 2016-07-12 DIAGNOSIS — E559 Vitamin D deficiency, unspecified: Secondary | ICD-10-CM | POA: Diagnosis not present

## 2016-07-12 DIAGNOSIS — F32A Depression, unspecified: Secondary | ICD-10-CM | POA: Insufficient documentation

## 2016-07-12 LAB — COMPREHENSIVE METABOLIC PANEL
ALT: 23 U/L (ref 0–35)
AST: 25 U/L (ref 0–37)
Albumin: 4.3 g/dL (ref 3.5–5.2)
Alkaline Phosphatase: 56 U/L (ref 39–117)
BILIRUBIN TOTAL: 0.5 mg/dL (ref 0.2–1.2)
BUN: 18 mg/dL (ref 6–23)
CALCIUM: 9.8 mg/dL (ref 8.4–10.5)
CHLORIDE: 99 meq/L (ref 96–112)
CO2: 30 mEq/L (ref 19–32)
CREATININE: 0.88 mg/dL (ref 0.40–1.20)
GFR: 66 mL/min (ref >60.00)
Glucose, Bld: 99 mg/dL (ref 70–99)
Potassium: 4 mEq/L (ref 3.5–5.1)
Sodium: 135 mEq/L (ref 135–145)
Total Protein: 7.4 g/dL (ref 6.0–8.3)

## 2016-07-12 LAB — LIPID PANEL
Cholesterol: 148 mg/dL (ref 0–200)
HDL: 38.6 mg/dL — AB (ref >39.00)
NonHDL: 109.4
TRIGLYCERIDES: 219 mg/dL — AB (ref 0.0–149.0)
Total CHOL/HDL Ratio: 4
VLDL: 43.8 mg/dL — ABNORMAL HIGH (ref 0.0–40.0)

## 2016-07-12 LAB — TSH: TSH: 0.6 u[IU]/mL (ref 0.35–4.50)

## 2016-07-12 LAB — LDL CHOLESTEROL, DIRECT: Direct LDL: 83 mg/dL

## 2016-07-12 MED ORDER — FUROSEMIDE 40 MG PO TABS: 40.0000 mg | ORAL_TABLET | Freq: Every day | ORAL | 0 refills | Status: DC

## 2016-07-12 MED ORDER — VITAMIN D (ERGOCALCIFEROL) 1.25 MG (50000 UNIT) PO CAPS: 50000.0000 [IU] | ORAL_CAPSULE | ORAL | 0 refills | Status: DC

## 2016-07-12 NOTE — Progress Notes (Signed)
Subjective:    Patient ID: Dawn Gomez, female    DOB: 30-Oct-1935, 81 y.o.   MRN: [id number]  Chief Complaint  Patient presents with  . Establish Care    HPI:  Dawn Gomez is a 81 y.o. female who  has a past medical history of Abnormal heart rhythm; Dyspnea; Hypothyroid; OSA (obstructive sleep apnea); and SVT (supraventricular tachycardia) ([place]). and presents today for an office visit to establish care.   1.) Vitamin D Deficiency - Currently maintained on Vitamin D. Reports taking the supplement as prescribd and denies adverse side effects. Indicates it has been a while since her last DEXA scan.  2.) Hypothyroidism - Currently maintained on levothyroxine. Reports taking the medication as prescribed and denies adverse side effects. Symptoms appear to be adequately controlled.  3.) SVT - Currently maintained on diltiazem. Reports taking the medication as prescribed and denies any recent/current flare-ups. She has noted some increased amounts of edema that is currently well controlled with furosemide as needed. She is followed by Dr. Nadyne Coombes for her cardiologist.    4.) Depression - Currently maintained on escitalopram. Reports taking the medication as prescribed and denies adverse side effects or sucidial ideations. Notes that her mood is stable currently. She does continue to have some difficulty going to sleep.   5.) Hyperlipidemia - Currently maintained on atorvastatin. Reports taking the medication as prescribed and denies adverse side effects or myalgias.    Allergies  Allergen Reactions  . Codeine   . Hydromorphone Hcl     REACTION: dementia  . Morphine   . Oxycodone-Acetaminophen   . Propoxyphene N-Acetaminophen   . Simvastatin       Outpatient Medications Prior to Visit  Medication Sig Dispense Refill  . acetaminophen (TYLENOL) 500 MG tablet Take 500 mg by mouth every 6 (six) hours as needed.    Marland Kitchen aspirin 81 MG tablet Take 81 mg by mouth daily.    Marland Kitchen atorvastatin  (LIPITOR) 10 MG tablet Take 10 mg by mouth daily.    Marland Kitchen diltiazem (CARDIZEM CD) 180 MG 24 hr capsule Take 120 mg by mouth daily.     Marland Kitchen escitalopram (LEXAPRO) 20 MG tablet Take 20 mg by mouth daily.    . fenofibrate (TRICOR) 145 MG tablet Take 145 mg by mouth daily.    Marland Kitchen levothyroxine (SYNTHROID, LEVOTHROID) 100 MCG tablet Take 100 mcg by mouth daily.      Marland Kitchen lisinopril (PRINIVIL,ZESTRIL) 5 MG tablet Once daily    . potassium chloride SA (K-DUR,KLOR-CON) 20 MEQ tablet Take 20 mEq by mouth daily.     . calcium & magnesium carbonates (MYLANTA) 694-854 MG tablet Take 1 tablet by mouth daily.    . diphenhydrAMINE (BENADRYL) 25 MG tablet Take 25 mg by mouth as needed.    . furosemide (LASIX) 20 MG tablet Take 40 mg by mouth daily.     . Vitamin D, Ergocalciferol, (DRISDOL) 50000 units CAPS capsule Take 5,000 Units by mouth.     No facility-administered medications prior to visit.      Past Medical History:  Diagnosis Date  . Abnormal heart rhythm   . Dyspnea   . Hypothyroid   . OSA (obstructive sleep apnea)    on cpap  . SVT (supraventricular tachycardia) (H[facility])       Past Surgical History:  Procedure Laterality Date  . APPENDECTOMY    . CHOLECYSTECTOMY    . CRANIOTOMY     for aneurysms  . HERNIA REPAIR    .  TOTAL ABDOMINAL HYSTERECTOMY    . TUBAL LIGATION        Family History  Problem Relation Age of Onset  . Emphysema Father   . Heart disease Father   . Clotting disorder Father   . Rheum arthritis Father   . Lung cancer Father   . Prostate cancer Father   . Bone cancer Father   . Emphysema Sister   . Emphysema Mother   . Heart disease Mother   . Rheum arthritis Mother   . Lung cancer Mother   . Allergies Sister   . Clotting disorder Sister   . Rheum arthritis Sister   . Breast cancer Sister   . Cancer Sister     ureter      Social History   Social History  . Marital status: Single    Spouse name: N/A  . Number of children: 1  . Years of education: 61     Occupational History  . retired Therapist, sports    Social History Main Topics  . Smoking status: Former Smoker    Packs/day: 1.00    Years: 51.00    Types: Cigarettes    Quit date: 04/08/2006  . Smokeless tobacco: Never Used  . Alcohol use No  . Drug use: No  . Sexual activity: Not on file   Other Topics Concern  . Not on file   Social History Narrative  . No narrative on file      Review of Systems  Constitutional: Negative for chills and fever.  Respiratory: Negative for cough, chest tightness, shortness of breath and wheezing.   Cardiovascular: Negative for chest pain, palpitations and leg swelling.  Endocrine: Negative for cold intolerance and heat intolerance.  Neurological: Negative for dizziness and weakness.  Psychiatric/Behavioral: Negative for behavioral problems, confusion, dysphoric mood, self-injury, sleep disturbance and suicidal ideas. The patient is not nervous/anxious.        Objective:    BP 116/64 (BP Location: Left Arm, Patient Position: Sitting, Cuff Size: Normal)   Pulse (!) 58   Temp 98.2 F (36.8 C) (Oral)   Resp 14   Ht 5' (1.524 m)   Wt 167 lb (75.8 kg)   SpO2 90%   BMI 32.61 kg/m  Nursing note and vital signs reviewed.  Physical Exam  Constitutional: She is oriented to person, place, and time. She appears well-developed and well-nourished. No distress.  Neck: Neck supple. No thyromegaly present.  Cardiovascular: Normal rate, regular rhythm, normal heart sounds and intact distal pulses.   Pulmonary/Chest: Effort normal. No tachypnea. She has decreased breath sounds.  Seated in the chair and maintained on 2 L O2 via [place].  Lymphadenopathy:    She has no cervical adenopathy.  Neurological: She is alert and oriented to person, place, and time.  Skin: Skin is warm and dry.  Psychiatric: She has a normal mood and affect. Her behavior is normal. Judgment and thought content normal.        Assessment & Plan:   Problem List Items Addressed This  Visit      Cardiovascular and Mediastinum   SVT (supraventricular tachycardia) ([place])    Previously diagnosed with supraventricular tachycardia maintained on diltiazem for rate control with the adverse side effect of occasional leg swelling that is adequately controlled with furosemide. Occasional episodes with no symptoms currently. Continue current dosage of diltiazem and furosemide.       Relevant Medications   furosemide (LASIX) 40 MG tablet   Other Relevant Orders   Comprehensive  metabolic panel (Completed)   TSH (Completed)     Respiratory   Chronic respiratory failure ([facility])    Chronic respiratory failure appear stable with current medication regimen and continuous oxygen. There is slightly decreased lung sounds noted bilaterally. Follow-up and changes per pulmonology.        Endocrine   Other specified hypothyroidism - Primary    Previously diagnosed with hypothyroidism and currently maintained on levothyroxine with no adverse side effects. Obtain TSH. Continue current dosage of levothyroxine pending TSH results.      Relevant Orders   TSH (Completed)     Other   Hyperlipidemia    Previously diagnosed with hyperlipidemia and currently maintained on atorvastatin with no adverse side effects or myalgias. Obtain lipid profile. Continue current dosage of atorvastatin pending lipid profile results.      Relevant Medications   furosemide (LASIX) 40 MG tablet   Other Relevant Orders   Comprehensive metabolic panel (Completed)   Lipid Profile (Completed)   Vitamin D deficiency    Vitamin D deficiency and maintained on supplementation with no adverse side effects. Continue current dosage of vitamin D weekly. Consider bone marrow density check at next office visit/annual exam.      Depression    Currently maintained on Lexapro with no adverse side effects. Her mood remains labile at times although improved with medication regimen and denies suicidal ideation. Encouraged  counseling which patient declines at this time. Continue current dosage of Lexapro.          I have discontinued Ms. Rochelle's calcium & magnesium carbonates and diphenhydrAMINE. I have also changed her Vitamin D (Ergocalciferol) and furosemide. Additionally, I am having her maintain her diltiazem, potassium chloride SA, levothyroxine, lisinopril, atorvastatin, fenofibrate, aspirin, escitalopram, acetaminophen, Ascorbic Acid (VITAMIN C PO), and VITAMIN E PO.   Meds ordered this encounter  Medications  . Ascorbic Acid (VITAMIN C PO)    Sig: Take by mouth.  Marland Kitchen VITAMIN E PO    Sig: Take by mouth.  . Vitamin D, Ergocalciferol, (DRISDOL) 50000 units CAPS capsule    Sig: Take 1 capsule (50,000 Units total) by mouth every 7 (seven) days.    Dispense:  12 capsule    Refill:  0  . furosemide (LASIX) 40 MG tablet    Sig: Take 1 tablet (40 mg total) by mouth daily.    Dispense:  90 tablet    Refill:  0     Follow-up: Return in about 3 months (around 10/11/2016), or if symptoms worsen or fail to improve.  Mauricio Po, FNP

## 2016-07-12 NOTE — Assessment & Plan Note (Signed)
Vitamin D deficiency and maintained on supplementation with no adverse side effects. Continue current dosage of vitamin D weekly. Consider bone marrow density check at next office visit/annual exam.

## 2016-07-12 NOTE — Assessment & Plan Note (Signed)
Previously diagnosed with hypothyroidism and currently maintained on levothyroxine with no adverse side effects. Obtain TSH. Continue current dosage of levothyroxine pending TSH results.

## 2016-07-12 NOTE — Patient Instructions (Signed)
Thank you for choosing [facility].  SUMMARY AND INSTRUCTIONS:  Please continue to take your medication as prescribed.   Schedule a time for your annual wellness when due.   Medication:  Your prescription(s) have been submitted to your pharmacy or been printed and provided for you. Please take as directed and contact our office if you believe you are having problem(s) with the medication(s) or have any questions.  Labs:  Please stop by the lab on the lower level of the building for your blood work. Your results will be released to [place] (or called to you) after review, usually within 72 hours after test completion. If any changes need to be made, you will be notified at that same time.  1.) The lab is open from 7:30am to 5:30 pm Monday-Friday 2.) No appointment is necessary 3.) Fasting (if needed) is 6-8 hours after food and drink; black coffee and water are okay   Follow up:  If your symptoms worsen or fail to improve, please contact our office for further instruction, or in case of emergency go directly to the emergency room at the closest medical facility.

## 2016-07-12 NOTE — Assessment & Plan Note (Signed)
Chronic respiratory failure appear stable with current medication regimen and continuous oxygen. There is slightly decreased lung sounds noted bilaterally. Follow-up and changes per pulmonology.

## 2016-07-12 NOTE — Assessment & Plan Note (Signed)
Currently maintained on Lexapro with no adverse side effects. Her mood remains labile at times although improved with medication regimen and denies suicidal ideation. Encouraged counseling which patient declines at this time. Continue current dosage of Lexapro.

## 2016-07-12 NOTE — Assessment & Plan Note (Signed)
Previously diagnosed with hyperlipidemia and currently maintained on atorvastatin with no adverse side effects or myalgias. Obtain lipid profile. Continue current dosage of atorvastatin pending lipid profile results.

## 2016-07-12 NOTE — Assessment & Plan Note (Signed)
Previously diagnosed with supraventricular tachycardia maintained on diltiazem for rate control with the adverse side effect of occasional leg swelling that is adequately controlled with furosemide. Occasional episodes with no symptoms currently. Continue current dosage of diltiazem and furosemide.

## 2016-07-16 ENCOUNTER — Encounter: Payer: Self-pay | Admitting: Pulmonary Disease

## 2016-07-16 ENCOUNTER — Ambulatory Visit (INDEPENDENT_AMBULATORY_CARE_PROVIDER_SITE_OTHER): Payer: Medicare Other | Admitting: Pulmonary Disease

## 2016-07-16 VITALS — BP 122/64 | HR 79 | Ht 60.0 in | Wt 168.0 lb

## 2016-07-16 DIAGNOSIS — J309 Allergic rhinitis, unspecified: Secondary | ICD-10-CM | POA: Diagnosis not present

## 2016-07-16 DIAGNOSIS — J849 Interstitial pulmonary disease, unspecified: Secondary | ICD-10-CM | POA: Diagnosis not present

## 2016-07-16 DIAGNOSIS — R05 Cough: Secondary | ICD-10-CM | POA: Diagnosis not present

## 2016-07-16 DIAGNOSIS — R222 Localized swelling, mass and lump, trunk: Secondary | ICD-10-CM | POA: Diagnosis not present

## 2016-07-16 DIAGNOSIS — J9611 Chronic respiratory failure with hypoxia: Secondary | ICD-10-CM

## 2016-07-16 DIAGNOSIS — R059 Cough, unspecified: Secondary | ICD-10-CM

## 2016-07-16 NOTE — Progress Notes (Signed)
Subjective:    Patient ID: Dawn Gomez, female    DOB: Jun 14, 1935, 81 y.o.   MRN: [id number] Synopsis:  Former patient of Dr. Gwenette Greet has pulmonary fibrosis of uncertain etiology.  HPI Chief Complaint  Patient presents with  . Follow-up    pt c/o sob with exertion, pnd, occasional prod cough, back pain.    Ruby says she had the flu about a month ago and had a really deep cough productive of green to yellow mucus.  She says this is better overall but she still has a dry cough which she attributes to her post nasal drip.  She has been on lisinopril for 5 years or more.    Post nasal drip: has been a chronic problem but definitely acutely worse.  She says that she has coughed up more than normal.  She has itchy eyes and scratchy throat, takes no medicine for allergies.  She has noticed swelling and pain in her R chest in the back over the last several months.  It is getting bigger.  Pulmonary fibrosis: She feels like her lungs have ben the same, no changes.  She notes a mild pain in her back when she takes a deep breath.  She feels dyspnea when she showers or takes a hot bath, this will make her need to rest for about 30 minutes afterwards.  She says that if she is busy walking around wihtout her O2 she has dyspnea.    Past Medical History:  Diagnosis Date  . Abnormal heart rhythm   . Dyspnea   . Hypothyroid   . OSA (obstructive sleep apnea)    on cpap  . SVT (supraventricular tachycardia) (H[facility])      Review of Systems  Constitutional: Positive for fatigue. Negative for chills and fever.  HENT: Negative for postnasal drip, rhinorrhea and sinus pressure.   Respiratory: Positive for shortness of breath. Negative for cough and wheezing.   Cardiovascular: Negative for chest pain, palpitations and leg swelling.       Objective:   Physical Exam Vitals:   07/16/16 1059  BP: 122/64  Pulse: 79  SpO2: 95%  Weight: 168 lb (76.2 kg)  Height: 5' (1.524 m)   2L [place]  Gen:  chronically ill appearing HENT: OP clear, TM's clear, neck supple PULM: Crackles bases B, normal percussion CV: RRR, no mgr, trace edema GI: BS+, soft, nontender Derm: no cyanosis or rash Psyche: normal mood and affect MSK: firm swollen subcutaneous area over posterior lower R hemithorax, non-tender, not red or warm   Records from her PCP reviewed Her she was cared for for hyperlipidemia and hypothyroidism.   LABS  2009:  ANA, RF, ACE, ESR, all ok.  Autoimmune 01/2014:  Negative 04/2015 ANA, RF, CCP, JO-1, SSA, SSB, SCL-70, Anti-centromere all negative  PFT's  10/2010:  No obstruction, TLC 3.05 (73% pred), DLCO 6.1 (31%) PFT's 2013:  No obstruction, TLC 3.05 (73%), DLCO 6.3 (31%) PFT's 03/2013:  FEV1 1.52 (87%), ratio 88, FVC 1.73 (74%), TLC 2.80 (62%), DLCO 7.22 (38%) PFT's 01/2014:  FEV1 1.54 (90%), ratio 87, FVC 1.78 (76%), TLC 2.90 (65%), DLCO 6.22 (32%) 04/2015 PFT> FVC 1.67 L (73% predicted), total lung capacity 2.76 L (62% predicted), DLCO 6.50 (34% predicted). July 2017 pulmonary function testing ratio 89%, FVC 1.72 L 76% predicted, total lung capacity 3.08 L, 69% predicted, DLCO 5.89 31% predicte  Imaging: CXR 10/2010:  Stable interstitial changes. CXR 2013:  Stable IS changes CT chest 2014:  Subpleural reticulation, ?  early honeycombing, per chest radiology >> no definite GGO, may be UIP or maybe not.  HRCT 01/2014:  Slight progression of ISLD, and pattern suggestive of UIP 03/2015 CT chest , stable fibrosis since 2014   Other: ONO on RA with cpap 06/2012:  Low sat 85%, only 7 min less than 88% entire night.  Ambul ox 07/2013:  desat to 87% transiently.  Pt would like to hold off on portable oxygen   Echo: TTE 06/2012:  Normal LV, DD, normal RV, no evidence for pulmonary htn.        Assessment & Plan:  Interstitial lung disease ([place]) It has been quite a while since I've seen Ms. Badie and unfortunately we've been unable to assess her lung disease objectively. As stated  previously we felt that there is been no indication for anti-fibrotic therapy because we don't have a clear diagnosis.  Plan: Check, and function test Repeat high-resolution CT scan of the chest to see if there is findings now worrisome for usual interstitial pneumonitis Check 6 minute walk Follow-up 3 months  Chronic respiratory failure ([place]) Continue 2 L of oxygen continuously  Allergic rhinitis This has been more of a problem for her recently and she is currently not taking anything for it. It is contributing to her dry cough.  Plan: Generic Zyrtec Generic Flonase   Chest wall mass She has a firm chest wall mass over the posterior right hemithorax which is been developing over the last several months. It's not clear to me was causing this. We will start with a CT scan of the chest to evaluate further.    Current Outpatient Prescriptions:  .  acetaminophen (TYLENOL) 500 MG tablet, Take 500 mg by mouth every 6 (six) hours as needed., Disp: , Rfl:  .  Ascorbic Acid (VITAMIN C PO), Take by mouth., Disp: , Rfl:  .  aspirin 81 MG tablet, Take 81 mg by mouth daily., Disp: , Rfl:  .  atorvastatin (LIPITOR) 10 MG tablet, Take 10 mg by mouth daily., Disp: , Rfl:  .  diltiazem (CARDIZEM CD) 180 MG 24 hr capsule, Take 120 mg by mouth daily. , Disp: , Rfl:  .  escitalopram (LEXAPRO) 20 MG tablet, Take 20 mg by mouth daily., Disp: , Rfl:  .  fenofibrate (TRICOR) 145 MG tablet, Take 145 mg by mouth daily., Disp: , Rfl:  .  furosemide (LASIX) 40 MG tablet, Take 1 tablet (40 mg total) by mouth daily., Disp: 90 tablet, Rfl: 0 .  levothyroxine (SYNTHROID, LEVOTHROID) 100 MCG tablet, Take 100 mcg by mouth daily.  , Disp: , Rfl:  .  lisinopril (PRINIVIL,ZESTRIL) 5 MG tablet, Once daily, Disp: , Rfl:  .  potassium chloride SA (K-DUR,KLOR-CON) 20 MEQ tablet, Take 20 mEq by mouth daily. , Disp: , Rfl:  .  Vitamin D, Ergocalciferol, (DRISDOL) 50000 units CAPS capsule, Take 1 capsule (50,000 Units  total) by mouth every 7 (seven) days., Disp: 12 capsule, Rfl: 0 .  VITAMIN E PO, Take by mouth., Disp: , Rfl:

## 2016-07-16 NOTE — Assessment & Plan Note (Signed)
She has a firm chest wall mass over the posterior right hemithorax which is been developing over the last several months. It's not clear to me was causing this. We will start with a CT scan of the chest to evaluate further.

## 2016-07-16 NOTE — Patient Instructions (Addendum)
For your post nasal drip: Use Neil Med rinses with distilled water at least twice per day using the instructions on the package. 1/2 hour after using the [facility] Med rinse, use Nasacort two puffs in each nostril once per day.  Remember that the Nasacort can take 1-2 weeks to work after regular use. Use generic zyrtec (cetirizine) every day.  If this doesn't help, then stop taking it and use chlorpheniramine-phenylephrine combination tablets.  We will arrange for another 6 minute walk test and a pulmonary function test  We will order a CT scan to evaluate your lung disease again as well as the swelling in your back/chest  We will see you back in 3 months or sooner if needed

## 2016-07-16 NOTE — Assessment & Plan Note (Signed)
This has been more of a problem for her recently and she is currently not taking anything for it. It is contributing to her dry cough.  Plan: Generic Zyrtec Generic Flonase

## 2016-07-16 NOTE — Assessment & Plan Note (Signed)
It has been quite a while since I've seen Dawn Gomez and unfortunately we've been unable to assess her lung disease objectively. As stated previously we felt that there is been no indication for anti-fibrotic therapy because we don't have a clear diagnosis.  Plan: Check, and function test Repeat high-resolution CT scan of the chest to see if there is findings now worrisome for usual interstitial pneumonitis Check 6 minute walk Follow-up 3 months

## 2016-07-16 NOTE — Assessment & Plan Note (Signed)
Continue 2 L of oxygen continuously. 

## 2016-07-22 DIAGNOSIS — J8410 Pulmonary fibrosis, unspecified: Secondary | ICD-10-CM | POA: Diagnosis not present

## 2016-07-30 ENCOUNTER — Inpatient Hospital Stay: Admission: RE | Admit: 2016-07-30 | Payer: Medicare Other | Source: Ambulatory Visit

## 2016-08-05 ENCOUNTER — Emergency Department (HOSPITAL_COMMUNITY): Payer: Medicare Other

## 2016-08-05 ENCOUNTER — Inpatient Hospital Stay (HOSPITAL_COMMUNITY)
Admission: EM | Admit: 2016-08-05 | Discharge: 2016-08-10 | DRG: 196 | Disposition: A | Discharge status: Home Health | Payer: Medicare Other | Attending: Internal Medicine | Admitting: Internal Medicine

## 2016-08-05 ENCOUNTER — Telehealth: Payer: Self-pay | Admitting: Family

## 2016-08-05 ENCOUNTER — Encounter (HOSPITAL_COMMUNITY): Payer: Self-pay | Admitting: Emergency Medicine

## 2016-08-05 ENCOUNTER — Inpatient Hospital Stay: Admission: RE | Admit: 2016-08-05 | Payer: Medicare Other | Source: Ambulatory Visit

## 2016-08-05 DIAGNOSIS — J479 Bronchiectasis, uncomplicated: Secondary | ICD-10-CM | POA: Diagnosis not present

## 2016-08-05 DIAGNOSIS — J939 Pneumothorax, unspecified: Secondary | ICD-10-CM | POA: Diagnosis not present

## 2016-08-05 DIAGNOSIS — I5032 Chronic diastolic (congestive) heart failure: Secondary | ICD-10-CM | POA: Diagnosis present

## 2016-08-05 DIAGNOSIS — J8410 Pulmonary fibrosis, unspecified: Secondary | ICD-10-CM | POA: Diagnosis not present

## 2016-08-05 DIAGNOSIS — J9621 Acute and chronic respiratory failure with hypoxia: Secondary | ICD-10-CM | POA: Diagnosis not present

## 2016-08-05 DIAGNOSIS — R739 Hyperglycemia, unspecified: Secondary | ICD-10-CM | POA: Diagnosis present

## 2016-08-05 DIAGNOSIS — I110 Hypertensive heart disease with heart failure: Secondary | ICD-10-CM | POA: Diagnosis present

## 2016-08-05 DIAGNOSIS — Z79899 Other long term (current) drug therapy: Secondary | ICD-10-CM | POA: Diagnosis not present

## 2016-08-05 DIAGNOSIS — J84112 Idiopathic pulmonary fibrosis: Secondary | ICD-10-CM | POA: Diagnosis not present

## 2016-08-05 DIAGNOSIS — E039 Hypothyroidism, unspecified: Secondary | ICD-10-CM | POA: Diagnosis not present

## 2016-08-05 DIAGNOSIS — Z7982 Long term (current) use of aspirin: Secondary | ICD-10-CM | POA: Diagnosis not present

## 2016-08-05 DIAGNOSIS — R0902 Hypoxemia: Secondary | ICD-10-CM | POA: Diagnosis not present

## 2016-08-05 DIAGNOSIS — R0602 Shortness of breath: Secondary | ICD-10-CM

## 2016-08-05 DIAGNOSIS — Z9981 Dependence on supplemental oxygen: Secondary | ICD-10-CM | POA: Diagnosis not present

## 2016-08-05 DIAGNOSIS — G4733 Obstructive sleep apnea (adult) (pediatric): Secondary | ICD-10-CM | POA: Diagnosis not present

## 2016-08-05 DIAGNOSIS — R079 Chest pain, unspecified: Secondary | ICD-10-CM | POA: Diagnosis not present

## 2016-08-05 DIAGNOSIS — R911 Solitary pulmonary nodule: Secondary | ICD-10-CM | POA: Diagnosis not present

## 2016-08-05 DIAGNOSIS — J8489 Other specified interstitial pulmonary diseases: Secondary | ICD-10-CM | POA: Diagnosis not present

## 2016-08-05 DIAGNOSIS — Z87891 Personal history of nicotine dependence: Secondary | ICD-10-CM

## 2016-08-05 HISTORY — DX: Disorder of thyroid, unspecified: E07.9

## 2016-08-05 HISTORY — DX: Pulmonary fibrosis, unspecified: J84.10

## 2016-08-05 HISTORY — DX: Aortic aneurysm of unspecified site, without rupture: I71.9

## 2016-08-05 LAB — CBC WITH DIFFERENTIAL/PLATELET
Basophils Absolute: 0 10*3/uL (ref 0.0–0.1)
Basophils Relative: 1 %
EOS PCT: 5 %
Eosinophils Absolute: 0.3 10*3/uL (ref 0.0–0.7)
HCT: 35.2 % — ABNORMAL LOW (ref 36.0–46.0)
Hemoglobin: 11.6 g/dL — ABNORMAL LOW (ref 12.0–15.0)
LYMPHS ABS: 1 10*3/uL (ref 0.7–4.0)
LYMPHS PCT: 19 %
MCH: 30.7 pg (ref 26.0–34.0)
MCHC: 33 g/dL (ref 30.0–36.0)
MCV: 93.1 fL (ref 78.0–100.0)
MONO ABS: 0.4 10*3/uL (ref 0.1–1.0)
Monocytes Relative: 7 %
Neutro Abs: 3.9 10*3/uL (ref 1.7–7.7)
Neutrophils Relative %: 70 %
PLATELETS: 178 10*3/uL (ref 150–400)
RBC: 3.78 MIL/uL — ABNORMAL LOW (ref 3.87–5.11)
RDW: 14.6 % (ref 11.5–15.5)
WBC: 5.5 10*3/uL (ref 4.0–10.5)

## 2016-08-05 LAB — BASIC METABOLIC PANEL
Anion gap: 11 (ref 5–15)
BUN: 7 mg/dL (ref 6–20)
CO2: 25 mmol/L (ref 22–32)
Calcium: 8.4 mg/dL — ABNORMAL LOW (ref 8.9–10.3)
Chloride: 103 mmol/L (ref 101–111)
Creatinine, Ser: 0.6 mg/dL (ref 0.44–1.00)
GFR calc Af Amer: >60 mL/min (ref >60)
GLUCOSE: 132 mg/dL — AB (ref 65–99)
POTASSIUM: 3.8 mmol/L (ref 3.5–5.1)
Sodium: 139 mmol/L (ref 135–145)

## 2016-08-05 LAB — I-STAT VENOUS BLOOD GAS, ED
Acid-Base Excess: 4 mmol/L — ABNORMAL HIGH (ref 0.0–2.0)
Bicarbonate: 29.8 mmol/L — ABNORMAL HIGH (ref 20.0–28.0)
O2 SAT: 22 %
PCO2 VEN: 48.7 mmHg (ref 44.0–60.0)
PH VEN: 7.394 (ref 7.250–7.430)
PO2 VEN: 17 mmHg — AB (ref 32.0–45.0)
TCO2: 31 mmol/L (ref 0–100)

## 2016-08-05 LAB — BRAIN NATRIURETIC PEPTIDE: B Natriuretic Peptide: 138.9 pg/mL — ABNORMAL HIGH (ref 0.0–100.0)

## 2016-08-05 LAB — I-STAT TROPONIN, ED: Troponin i, poc: 0 ng/mL (ref 0.00–0.08)

## 2016-08-05 MED ORDER — KETOROLAC TROMETHAMINE 60 MG/2ML IM SOLN
30.0000 mg | Freq: Once | INTRAMUSCULAR | Status: AC
  Administered 2016-08-05: 30 mg via INTRAMUSCULAR
  Filled 2016-08-05: qty 2

## 2016-08-05 NOTE — ED Notes (Signed)
Patient ambulated in hallway on 3L O2. Upon standing o2 was at 87% and dropped to 82% when walking from room to bathroom. Patient was instructed to return to bed where o2 returned to 96% with rest. Patient complained of dizziness upon standing and felt she was unable to get a good breath when walking.

## 2016-08-05 NOTE — Telephone Encounter (Signed)
Patient called back in.  Gave her Greg's response.  Patient states as soon as her son gets home she will get him to carry her to Urgent Care or ED.

## 2016-08-05 NOTE — ED Provider Notes (Signed)
[facility] DEPT Provider Note   CSN: [id number] Arrival date & time: 08/05/16  1948     History   Chief Complaint Chief Complaint  Patient presents with  . Shortness of Breath  . Hip Pain    HPI Dawn Gomez is a 81 y.o. female.  The history is provided by the patient.  Shortness of Breath  This is a new problem. The average episode lasts 1 day. The problem occurs continuously.The current episode started yesterday. The problem has been gradually worsening. Associated symptoms include cough, wheezing and leg pain. Pertinent negatives include no fever, no headaches, no coryza, no rhinorrhea, no sore throat, no swollen glands, no ear pain, no neck pain, no sputum production, no hemoptysis, no chest pain, no syncope, no vomiting, no abdominal pain, no rash, no leg swelling and no claudication. It is unknown what precipitated the problem. She has tried nothing for the symptoms. The treatment provided no relief. Associated medical issues include chronic lung disease.    Past Medical History:  Diagnosis Date  . Aortic aneurysm ([place])   . Pulmonary fibrosis ([place])   . Thyroid disease    hypo    There are no active problems to display for this patient.   Past Surgical History:  Procedure Laterality Date  . ABDOMINAL HYSTERECTOMY    . APPENDECTOMY    . CHOLECYSTECTOMY    . CRANIOTOMY  1980 x 2   aneurysmal clipping  . HERNIA REPAIR      OB History    No data available       Home Medications    Prior to Admission medications   Medication Sig Start Date End Date Taking? Authorizing Provider  aspirin EC 81 MG tablet Take 81 mg by mouth daily.   Yes Historical Provider, MD  CALCIUM-MAGNESIUM-ZINC PO Take 1 tablet by mouth daily.   Yes Historical Provider, MD  cholecalciferol (VITAMIN D) 1000 units tablet Take 5,000 Units by mouth daily.   Yes Historical Provider, MD  diltiazem (CARDIZEM CD) 180 MG 24 hr capsule Take 180 mg by mouth daily.   Yes Historical Provider, MD    escitalopram (LEXAPRO) 20 MG tablet Take 20 mg by mouth at bedtime.   Yes Historical Provider, MD  fenofibrate (TRICOR) 145 MG tablet Take 145 mg by mouth daily.   Yes Historical Provider, MD  furosemide (LASIX) 40 MG tablet Take 40 mg by mouth daily.   Yes Historical Provider, MD  levothyroxine (SYNTHROID, LEVOTHROID) 50 MCG tablet Take 50 mcg by mouth daily before breakfast.   Yes Historical Provider, MD  lisinopril (PRINIVIL,ZESTRIL) 5 MG tablet Take 5 mg by mouth daily.   Yes Historical Provider, MD  potassium chloride SA (K-DUR,KLOR-CON) 20 MEQ tablet Take 20 mEq by mouth every morning.   Yes Historical Provider, MD  vitamin B-12 (CYANOCOBALAMIN) 1000 MCG tablet Take 500 mcg by mouth daily.   Yes Historical Provider, MD  vitamin C (ASCORBIC ACID) 500 MG tablet Take 500 mg by mouth daily.   Yes Historical Provider, MD  vitamin E 400 UNIT capsule Take 200 Units by mouth daily.   Yes Historical Provider, MD    Family History History reviewed. No pertinent family history.  Social History Social History  Substance Use Topics  . Smoking status: Former Smoker    Packs/day: 2.00    Years: 50.00    Quit date: 2008  . Smokeless tobacco: Never Used  . Alcohol use Yes     Comment: "drank back in her younger wilder  days"     Allergies   Lipitor [atorvastatin]   Review of Systems Review of Systems  Constitutional: Positive for chills. Negative for fever.  HENT: Negative for ear pain, rhinorrhea and sore throat.   Eyes: Negative for pain and visual disturbance.  Respiratory: Positive for cough, shortness of breath and wheezing. Negative for hemoptysis and sputum production.   Cardiovascular: Negative for chest pain, palpitations, claudication, leg swelling and syncope.  Gastrointestinal: Negative for abdominal pain and vomiting.  Genitourinary: Negative for dysuria and hematuria.  Musculoskeletal: Positive for myalgias. Negative for arthralgias, back pain and neck pain.  Skin:  Negative for color change and rash.  Neurological: Positive for light-headedness. Negative for seizures, syncope and headaches.  All other systems reviewed and are negative.    Physical Exam Updated Vital Signs BP (!) 121/49   Pulse (!) 50   Temp 97.7 F (36.5 C) (Oral)   Resp (!) 22   Ht '5\' 1"'$  (1.549 m)   Wt 76.2 kg   SpO2 96%   BMI 31.74 kg/m   Physical Exam  Constitutional: She appears well-developed and well-nourished. She has a sickly appearance. No distress.  HENT:  Head: Normocephalic and atraumatic.  Eyes: Conjunctivae and EOM are normal.  Neck: Neck supple.  Cardiovascular: Normal rate and regular rhythm.   No murmur heard. Pulmonary/Chest: Effort normal. No respiratory distress. She has rhonchi in the right lower field and the left lower field.  Abdominal: Soft. There is no tenderness.  Musculoskeletal: Normal range of motion. She exhibits no edema or tenderness.  Neurological: She is alert. GCS eye subscore is 4. GCS verbal subscore is 5. GCS motor subscore is 6.  Skin: Skin is warm and dry. No rash noted.  Psychiatric: She has a normal mood and affect. Her speech is normal.  Nursing note and vitals reviewed.    ED Treatments / Results  Labs (all labs ordered are listed, but only abnormal results are displayed) Labs Reviewed  CBC WITH DIFFERENTIAL/PLATELET - Abnormal; Notable for the following:       Result Value   RBC 3.78 (*)    Hemoglobin 11.6 (*)    HCT 35.2 (*)    All other components within normal limits  BASIC METABOLIC PANEL - Abnormal; Notable for the following:    Glucose, Bld 132 (*)    Calcium 8.4 (*)    All other components within normal limits  BRAIN NATRIURETIC PEPTIDE - Abnormal; Notable for the following:    B Natriuretic Peptide 138.9 (*)    All other components within normal limits  I-STAT VENOUS BLOOD GAS, ED - Abnormal; Notable for the following:    pO2, Ven 17.0 (*)    Bicarbonate 29.8 (*)    Acid-Base Excess 4.0 (*)    All  other components within normal limits  BLOOD GAS, VENOUS  INFLUENZA PANEL BY PCR (TYPE A & B)  I-STAT TROPOININ, ED    EKG  EKG Interpretation  Date/Time:  Monday August 05 2016 19:53:32 EDT Ventricular Rate:  65 PR Interval:  162 QRS Duration: 78 QT Interval:  446 QTC Calculation: 463 R Axis:   24 Text Interpretation:  Normal sinus rhythm Low voltage QRS Nonspecific T wave abnormality Abnormal ECG No old tracing to compare Nonspecific ST and T wave abnormality Confirmed by Kathrynn Humble, MD, Thelma Comp [phone]) on 08/05/2016 9:28:01 PM       Radiology Dg Chest 2 View  Result Date: 08/05/2016 CLINICAL DATA:  Central chest pain with shortness of breath EXAM:  CHEST  2 VIEW COMPARISON:  None. FINDINGS: Mild hyperinflation. Diffuse coarsening of the lung interstitium suggesting chronic interstitial change although difficult to exclude superimposed acute infiltrate or inflammation. Tiny left effusion or thickening. No focal consolidation. Cystic lucency at the right base. Borderline cardiomegaly. Atherosclerosis. No pneumothorax. Postsurgical changes in the upper abdomen. IMPRESSION: 1. Mild diffuse coarsening of the lung interstitium suggestive of chronic interstitial change ; difficult to exclude superimposed acute interstitial inflammation or edema without priors. 2. Cystic lucency at the right anterior lung base may represent a large lung cyst or bulla. 3. Borderline cardiomegaly. Electronically Signed   By: Donavan Foil M.D.   On: 08/05/2016 21:18    Procedures Procedures (including critical care time)  Medications Ordered in ED Medications  ketorolac (TORADOL) injection 30 mg (30 mg Intramuscular Given 08/05/16 2205)     Initial Impression / Assessment and Plan / ED Course  I have reviewed the triage vital signs and the nursing notes.  Pertinent labs & imaging results that were available during my care of the patient were reviewed by me and considered in my medical decision making (see  chart for details).     81 year old female with history of interstitial lung disease of unknown origin presents in the setting of shortness of breath and hypoxemia. Patient reports her last survey she's had worsening shortness of breath. She reports she's been told in the past he used to liters of oxygen at home at rest and 3 L with activity. She reports she has been doing this recently with difficulty performing activities of daily living due to shortness of breath.  On arrival patient's oxygen saturation were 87% without activity on 2 L. On 3 L patient oxygen went up to mid 90s. With activity patient was unable to even get out of the doorway without oxygen saturations plummeting to low 80s and patient having significant work of breathing. Chest x-ray consistent with interstitial lung disease. We'll obtain influenza swab to ensure no signs of viral infection. She does not have cough or fever. Laboratory analysis without significant abdomen maladies including no signs of ACS or heart failure. EKG without signs of acute ischemia.  At this time believe patient will require admission to hospital for further management of hypoxemia. Presentation not consistent with infectious etiology. Likely worsening of known interstitial lung disease. Believe patient would benefit from pulmonology evaluation. Stable at time of transfer of care.  Final Clinical Impressions(s) / ED Diagnoses   Final diagnoses:  Hypoxemia  SOB (shortness of breath)    New Prescriptions New Prescriptions   No medications on file     Esaw Grandchild, MD 08/06/16 2376    Varney Biles, MD 08/06/16 0104

## 2016-08-05 NOTE — Telephone Encounter (Signed)
Patient Name: Dawn Gomez  DOB: 1936-02-07    Initial Comment Caller says , she is having a reaction to her Rx, she has shortness of breath    Nurse Assessment  Nurse: Leilani Merl, RN, Heather Date/Time (Eastern Time): 08/05/2016 10:49:40 AM  Confirm and document reason for call. If symptomatic, describe symptoms. ---Caller states that her cholesterol medication on 07/12/2016 and she started having diff breathing with exertion last night  Does the patient have any new or worsening symptoms? ---Yes  Will a triage be completed? ---Yes  Related visit to physician within the last 2 weeks? ---No  Does the PT have any chronic conditions? (i.e. diabetes, asthma, etc.) ---Yes  List chronic conditions. ---See MR  Is this a behavioral health or substance abuse call? ---No     Guidelines    Guideline Title Affirmed Question Affirmed Notes  Breathing Difficulty Patient sounds very sick or weak to the triager    Final Disposition User   Go to ED Now (or PCP triage) Leilani Merl, RN, Heather    Comments  Called the office and information given about ED refusal   Referrals  GO TO FACILITY REFUSED   Disagree/Comply: Disagree  Disagree/Comply Reason: Disagree with instructions

## 2016-08-05 NOTE — Telephone Encounter (Signed)
Stop taking the medication and would recommend follow up at Urgent Care or the ED.

## 2016-08-05 NOTE — Telephone Encounter (Signed)
Noted  

## 2016-08-05 NOTE — Telephone Encounter (Signed)
LVM for pt to call back in regards to the issues below.

## 2016-08-05 NOTE — ED Triage Notes (Signed)
Pt states she is been feeling very SOB for the past few day, using O2 at home with a SPO2 86%, pt increases O2 on triage up to 95%.

## 2016-08-06 ENCOUNTER — Encounter (HOSPITAL_COMMUNITY): Payer: Self-pay | Admitting: Internal Medicine

## 2016-08-06 ENCOUNTER — Inpatient Hospital Stay (HOSPITAL_COMMUNITY): Payer: Medicare Other

## 2016-08-06 DIAGNOSIS — E039 Hypothyroidism, unspecified: Secondary | ICD-10-CM | POA: Diagnosis present

## 2016-08-06 DIAGNOSIS — R911 Solitary pulmonary nodule: Secondary | ICD-10-CM | POA: Diagnosis not present

## 2016-08-06 DIAGNOSIS — Z9981 Dependence on supplemental oxygen: Secondary | ICD-10-CM | POA: Diagnosis not present

## 2016-08-06 DIAGNOSIS — J8410 Pulmonary fibrosis, unspecified: Secondary | ICD-10-CM | POA: Diagnosis present

## 2016-08-06 DIAGNOSIS — J9610 Chronic respiratory failure, unspecified whether with hypoxia or hypercapnia: Secondary | ICD-10-CM | POA: Diagnosis not present

## 2016-08-06 DIAGNOSIS — R0902 Hypoxemia: Secondary | ICD-10-CM | POA: Diagnosis present

## 2016-08-06 DIAGNOSIS — J449 Chronic obstructive pulmonary disease, unspecified: Secondary | ICD-10-CM | POA: Diagnosis not present

## 2016-08-06 DIAGNOSIS — Z87891 Personal history of nicotine dependence: Secondary | ICD-10-CM | POA: Diagnosis not present

## 2016-08-06 DIAGNOSIS — J84112 Idiopathic pulmonary fibrosis: Secondary | ICD-10-CM | POA: Diagnosis not present

## 2016-08-06 DIAGNOSIS — J9621 Acute and chronic respiratory failure with hypoxia: Secondary | ICD-10-CM

## 2016-08-06 DIAGNOSIS — Z9989 Dependence on other enabling machines and devices: Secondary | ICD-10-CM | POA: Diagnosis not present

## 2016-08-06 DIAGNOSIS — R739 Hyperglycemia, unspecified: Secondary | ICD-10-CM | POA: Diagnosis not present

## 2016-08-06 DIAGNOSIS — J939 Pneumothorax, unspecified: Secondary | ICD-10-CM | POA: Diagnosis present

## 2016-08-06 DIAGNOSIS — J479 Bronchiectasis, uncomplicated: Secondary | ICD-10-CM | POA: Diagnosis present

## 2016-08-06 DIAGNOSIS — I110 Hypertensive heart disease with heart failure: Secondary | ICD-10-CM | POA: Diagnosis present

## 2016-08-06 DIAGNOSIS — I5032 Chronic diastolic (congestive) heart failure: Secondary | ICD-10-CM | POA: Diagnosis present

## 2016-08-06 DIAGNOSIS — J8489 Other specified interstitial pulmonary diseases: Secondary | ICD-10-CM | POA: Diagnosis present

## 2016-08-06 DIAGNOSIS — Z7982 Long term (current) use of aspirin: Secondary | ICD-10-CM | POA: Diagnosis not present

## 2016-08-06 DIAGNOSIS — J9690 Respiratory failure, unspecified, unspecified whether with hypoxia or hypercapnia: Secondary | ICD-10-CM | POA: Diagnosis not present

## 2016-08-06 DIAGNOSIS — Z79899 Other long term (current) drug therapy: Secondary | ICD-10-CM | POA: Diagnosis not present

## 2016-08-06 DIAGNOSIS — G4733 Obstructive sleep apnea (adult) (pediatric): Secondary | ICD-10-CM

## 2016-08-06 LAB — CBC
HEMATOCRIT: 34.8 % — AB (ref 36.0–46.0)
HEMOGLOBIN: 11.4 g/dL — AB (ref 12.0–15.0)
MCH: 30.5 pg (ref 26.0–34.0)
MCHC: 32.8 g/dL (ref 30.0–36.0)
MCV: 93 fL (ref 78.0–100.0)
Platelets: 154 10*3/uL (ref 150–400)
RBC: 3.74 MIL/uL — ABNORMAL LOW (ref 3.87–5.11)
RDW: 14.6 % (ref 11.5–15.5)
WBC: 4.8 10*3/uL (ref 4.0–10.5)

## 2016-08-06 LAB — BASIC METABOLIC PANEL
Anion gap: 8 (ref 5–15)
BUN: 6 mg/dL (ref 6–20)
CO2: 28 mmol/L (ref 22–32)
Calcium: 8.6 mg/dL — ABNORMAL LOW (ref 8.9–10.3)
Chloride: 104 mmol/L (ref 101–111)
Creatinine, Ser: 0.62 mg/dL (ref 0.44–1.00)
GFR calc Af Amer: >60 mL/min (ref >60)
GLUCOSE: 147 mg/dL — AB (ref 65–99)
POTASSIUM: 3.3 mmol/L — AB (ref 3.5–5.1)
Sodium: 140 mmol/L (ref 135–145)

## 2016-08-06 LAB — INFLUENZA PANEL BY PCR (TYPE A & B)
INFLAPCR: NEGATIVE
Influenza B By PCR: NEGATIVE

## 2016-08-06 LAB — GLUCOSE, CAPILLARY: GLUCOSE-CAPILLARY: 243 mg/dL — AB (ref 65–99)

## 2016-08-06 LAB — TSH: TSH: 0.986 u[IU]/mL (ref 0.350–4.500)

## 2016-08-06 MED ORDER — LISINOPRIL 5 MG PO TABS
5.0000 mg | ORAL_TABLET | Freq: Every day | ORAL | Status: DC
  Administered 2016-08-06 – 2016-08-10 (×5): 5 mg via ORAL
  Filled 2016-08-06 (×5): qty 1

## 2016-08-06 MED ORDER — ACETAMINOPHEN 650 MG RE SUPP: 650.0000 mg | Freq: Four times a day (QID) | RECTAL | Status: DC | PRN

## 2016-08-06 MED ORDER — DILTIAZEM HCL ER COATED BEADS 180 MG PO CP24
180.0000 mg | ORAL_CAPSULE | Freq: Every day | ORAL | Status: DC
  Administered 2016-08-06 – 2016-08-10 (×5): 180 mg via ORAL
  Filled 2016-08-06 (×5): qty 1

## 2016-08-06 MED ORDER — ALBUTEROL SULFATE (2.5 MG/3ML) 0.083% IN NEBU: 2.5000 mg | INHALATION_SOLUTION | RESPIRATORY_TRACT | Status: DC | PRN

## 2016-08-06 MED ORDER — FENOFIBRATE 160 MG PO TABS
160.0000 mg | ORAL_TABLET | Freq: Every day | ORAL | Status: DC
  Administered 2016-08-06 – 2016-08-10 (×5): 160 mg via ORAL
  Filled 2016-08-06 (×5): qty 1

## 2016-08-06 MED ORDER — DIPHENHYDRAMINE HCL 25 MG PO CAPS
25.0000 mg | ORAL_CAPSULE | Freq: Every evening | ORAL | Status: DC | PRN
  Administered 2016-08-06 – 2016-08-09 (×4): 25 mg via ORAL
  Filled 2016-08-06 (×4): qty 1

## 2016-08-06 MED ORDER — ASPIRIN EC 81 MG PO TBEC
81.0000 mg | DELAYED_RELEASE_TABLET | Freq: Every day | ORAL | Status: DC
  Administered 2016-08-06 – 2016-08-10 (×5): 81 mg via ORAL
  Filled 2016-08-06 (×5): qty 1

## 2016-08-06 MED ORDER — ACETAMINOPHEN 325 MG PO TABS
650.0000 mg | ORAL_TABLET | Freq: Four times a day (QID) | ORAL | Status: DC | PRN
  Administered 2016-08-06 – 2016-08-09 (×6): 650 mg via ORAL
  Filled 2016-08-06 (×6): qty 2

## 2016-08-06 MED ORDER — METHYLPREDNISOLONE SODIUM SUCC 125 MG IJ SOLR
60.0000 mg | Freq: Four times a day (QID) | INTRAMUSCULAR | Status: DC
  Administered 2016-08-06 – 2016-08-08 (×8): 60 mg via INTRAVENOUS
  Filled 2016-08-06 (×8): qty 2

## 2016-08-06 MED ORDER — DOCUSATE SODIUM 100 MG PO CAPS
100.0000 mg | ORAL_CAPSULE | Freq: Two times a day (BID) | ORAL | Status: DC
  Administered 2016-08-06 – 2016-08-10 (×9): 100 mg via ORAL
  Filled 2016-08-06 (×9): qty 1

## 2016-08-06 MED ORDER — IPRATROPIUM-ALBUTEROL 0.5-2.5 (3) MG/3ML IN SOLN
3.0000 mL | Freq: Three times a day (TID) | RESPIRATORY_TRACT | Status: DC
  Administered 2016-08-06 – 2016-08-10 (×13): 3 mL via RESPIRATORY_TRACT
  Filled 2016-08-06 (×14): qty 3

## 2016-08-06 MED ORDER — LEVOTHYROXINE SODIUM 50 MCG PO TABS
50.0000 ug | ORAL_TABLET | Freq: Every day | ORAL | Status: DC
  Administered 2016-08-06 – 2016-08-10 (×5): 50 ug via ORAL
  Filled 2016-08-06 (×5): qty 1

## 2016-08-06 MED ORDER — METHYLPREDNISOLONE SODIUM SUCC 125 MG IJ SOLR
125.0000 mg | Freq: Two times a day (BID) | INTRAMUSCULAR | Status: DC
  Administered 2016-08-06 (×2): 125 mg via INTRAVENOUS
  Filled 2016-08-06 (×2): qty 2

## 2016-08-06 MED ORDER — POTASSIUM CHLORIDE CRYS ER 20 MEQ PO TBCR
30.0000 meq | EXTENDED_RELEASE_TABLET | Freq: Once | ORAL | Status: AC
  Administered 2016-08-06: 30 meq via ORAL
  Filled 2016-08-06: qty 1

## 2016-08-06 MED ORDER — ONDANSETRON HCL 4 MG PO TABS: 4.0000 mg | ORAL_TABLET | Freq: Four times a day (QID) | ORAL | Status: DC | PRN

## 2016-08-06 MED ORDER — ESCITALOPRAM OXALATE 20 MG PO TABS
20.0000 mg | ORAL_TABLET | Freq: Every day | ORAL | Status: DC
  Administered 2016-08-06 – 2016-08-09 (×4): 20 mg via ORAL
  Filled 2016-08-06 (×4): qty 1

## 2016-08-06 MED ORDER — ENOXAPARIN SODIUM 40 MG/0.4ML ~~LOC~~ SOLN
40.0000 mg | SUBCUTANEOUS | Status: DC
  Administered 2016-08-06 – 2016-08-10 (×5): 40 mg via SUBCUTANEOUS
  Filled 2016-08-06 (×5): qty 0.4

## 2016-08-06 MED ORDER — ONDANSETRON HCL 4 MG/2ML IJ SOLN: 4.0000 mg | Freq: Four times a day (QID) | INTRAMUSCULAR | Status: DC | PRN

## 2016-08-06 MED ORDER — IPRATROPIUM-ALBUTEROL 0.5-2.5 (3) MG/3ML IN SOLN
3.0000 mL | Freq: Four times a day (QID) | RESPIRATORY_TRACT | Status: DC
  Administered 2016-08-06: 3 mL via RESPIRATORY_TRACT
  Filled 2016-08-06: qty 3

## 2016-08-06 NOTE — Progress Notes (Signed)
Pt wants something to help her sleep. Baltazar Najjar, NP notified. Benadryl '25mg'$  was ordered. Will continue to monitor.

## 2016-08-06 NOTE — Progress Notes (Signed)
Patient was found on RA with O2 sats of 68%. When placed back on 3 L Learned patient O2 sats increased to 90%.

## 2016-08-06 NOTE — Progress Notes (Addendum)
08/05/2016  8:39 PM  08/06/2016 12:07 PM  Dawn Gomez was seen and examined.  The H&P by the admitting provider, orders, imaging was reviewed.  I called and requested pulmonary consultation.  The patient has a serious problem with her home situation. She is living in a house that is constantly damp and has black mold and possibly asbestos contamination.  I consulted social worker and care manager to see if there were any resources available to help her. She is likely going to continue to get worse if she lives in these conditions much longer.  Please see orders.  Will continue to follow.   Murvin Natal, MD [facility]

## 2016-08-06 NOTE — Care Management Note (Signed)
Case Management Note  Patient Details  Name: Dawn Gomez MRN: [id number] Date of Birth: 13-Jun-1935  Subjective/Objective:          Admitted with SOB, history  of hypothyroidism and pulmonary fibrosis.   [address] ([address] Darryll Capers (Sister)    [phone]      Action/Plan:   Expected Discharge Date:  08/09/16               Expected Discharge Plan:  Home/Self Care  In-House Referral:     Discharge planning Services  CM Consult  Post Acute Care Choice:    Choice offered to:     DME Arranged:    DME Agency:     HH Arranged:    HH Agency:     Status of Service:  In process, will continue to follow  If discussed at Long Length of Stay Meetings, dates discussed:    Additional Comments:  Sharin Mons, RN 08/06/2016, 12:25 PM

## 2016-08-06 NOTE — Progress Notes (Signed)
CM received consult: Living in mold infested home and has chronic lung disease, any resources? Social issue, CM made referral to CSW. Whitman Hero RN,BSN,CM

## 2016-08-06 NOTE — H&P (Signed)
History and Physical    Dawn Gomez GMW:[id number] DOB: [birth year] DOA: 08/05/2016  PCP: Elna Breslow, NP Consultants:  Lake Bells - pulmonology; Einar Gip - cardiology Patient coming from: home - lives with son; NOK: son, [phone]  Chief Complaint: SOB  HPI: Dawn Gomez is a 81 y.o. female with medical history significant of hypothyroidism and pulmonary fibrosis (idiopathic?) presenting with acute on chronic respiratory failure.  Patient reports that she gets up and walks to the door and develops SOB, "air cuts off".  She also feel like she has a pounding heart beat - her pulse went up to 100 last night, but has come down today.  Has h/o SVT.  Has pulmonary fibrosis.  "It's doing okay for right now" but due to go back in July for PFTs, ambulatory oxygenation tests,and she is due for a CT May 8.  NP Calone increased Lipitor, she thinks that is what caused the current problem.    2-3L home O2 all the time.  3L with activity.  Has been on that O2 for roughly 1 year.  Symptoms worsened yesterday.  Has been intermittently SOB before but it really hit her bad yesterday.  Dizzy, difficulty walking, some diarrhea, and a headache.  Has not had to increase O2.  Coughs intermittently, dry.  No productive cough for the last 3 weeks.  Denies fevers.   Chills all the time - thinks it is due to her age.  No chest pain.  Loose stools x 3 today.  No n/v.  Had difficulty with simvastatin about 1 year ago - n/v/d, difficulty getting out of bed; thinks she may be having simliar with current Lipitor.  ED Course: O2 sats at rest 87% on 2L - increased to mid 90s on 3L.  Walked to the doorway and sats decreased to the low 80s with significant WOB.  Suggest admission with pulmonary evaluation.  Review of Systems: As per HPI; otherwise review of systems reviewed and negative.   Ambulatory Status:  ambulates without assistance.  Past Medical History:  Diagnosis Date  . Aortic aneurysm ([place])   . Pulmonary fibrosis ([place])   .  Thyroid disease    hypo    Past Surgical History:  Procedure Laterality Date  . ABDOMINAL HYSTERECTOMY    . APPENDECTOMY    . CHOLECYSTECTOMY    . CRANIOTOMY  1980 x 2   aneurysmal clipping  . HERNIA REPAIR      Social History   Social History  . Marital status: N/A    Spouse name: N/A  . Number of children: N/A  . Years of education: N/A   Occupational History  . retired    Social History Main Topics  . Smoking status: Former Smoker    Packs/day: 2.00    Years: 50.00    Quit date: 2008  . Smokeless tobacco: Never Used  . Alcohol use Yes     Comment: "drank back in her younger wilder days"  . Drug use: No  . Sexual activity: Not on file   Other Topics Concern  . Not on file   Social History Narrative  . No narrative on file    Allergies  Allergen Reactions  . Lipitor [Atorvastatin] Shortness Of Breath, Diarrhea and Other (See Comments)    Dizzy, pain all over.    History reviewed. No pertinent family history.  Prior to Admission medications   Medication Sig Start Date End Date Taking? Authorizing Provider  aspirin EC 81 MG tablet Take 81 mg by mouth daily.  Yes Historical Provider, MD  CALCIUM-MAGNESIUM-ZINC PO Take 1 tablet by mouth daily.   Yes Historical Provider, MD  cholecalciferol (VITAMIN D) 1000 units tablet Take 5,000 Units by mouth daily.   Yes Historical Provider, MD  diltiazem (CARDIZEM CD) 180 MG 24 hr capsule Take 180 mg by mouth daily.   Yes Historical Provider, MD  escitalopram (LEXAPRO) 20 MG tablet Take 20 mg by mouth at bedtime.   Yes Historical Provider, MD  fenofibrate (TRICOR) 145 MG tablet Take 145 mg by mouth daily.   Yes Historical Provider, MD  furosemide (LASIX) 40 MG tablet Take 40 mg by mouth daily.   Yes Historical Provider, MD  levothyroxine (SYNTHROID, LEVOTHROID) 50 MCG tablet Take 50 mcg by mouth daily before breakfast.   Yes Historical Provider, MD  lisinopril (PRINIVIL,ZESTRIL) 5 MG tablet Take 5 mg by mouth daily.    Yes Historical Provider, MD  potassium chloride SA (K-DUR,KLOR-CON) 20 MEQ tablet Take 20 mEq by mouth every morning.   Yes Historical Provider, MD  vitamin B-12 (CYANOCOBALAMIN) 1000 MCG tablet Take 500 mcg by mouth daily.   Yes Historical Provider, MD  vitamin C (ASCORBIC ACID) 500 MG tablet Take 500 mg by mouth daily.   Yes Historical Provider, MD  vitamin E 400 UNIT capsule Take 200 Units by mouth daily.   Yes Historical Provider, MD    Physical Exam: Vitals:   08/05/16 2115 08/05/16 2245 08/05/16 2300 08/05/16 2315  BP: (!) 116/49 (!) 121/49    Pulse: (!) 50  (!) 51 (!) 50  Resp: (!) 22     Temp:      TempSrc:      SpO2: 96%  93% 96%  Weight:      Height:         General:  Appears calm and comfortable and is NAD on 3L [place] O2 Eyes:  PERRL, EOMI, normal lids, iris ENT:  grossly normal hearing, lips & tongue, mmm Neck:  no LAD, masses or thyromegaly Cardiovascular:  RRR, no m/r/g. No LE edema.  Respiratory:  CTA bilaterally, no w/r/r. Normal respiratory effort.  She is specifically not dyspneic or hypoxic with conversation (only ambulation) Abdomen:  soft, ntnd, NABS Skin:  no rash or induration seen on limited exam Musculoskeletal:  grossly normal tone BUE/BLE, good ROM, no bony abnormality Psychiatric:  grossly normal mood and affect, speech fluent and appropriate, AOx3 Neurologic:  CN 2-12 grossly intact, moves all extremities in coordinated fashion, sensation intact  Labs on Admission: I have personally reviewed following labs and imaging studies  CBC:  Recent Labs Lab 08/05/16 2009  WBC 5.5  NEUTROABS 3.9  HGB 11.6*  HCT 35.2*  MCV 93.1  PLT 628   Basic Metabolic Panel:  Recent Labs Lab 08/05/16 2009  NA 139  K 3.8  CL 103  CO2 25  GLUCOSE 132*  BUN 7  CREATININE 0.60  CALCIUM 8.4*   GFR: Estimated Creatinine Clearance: 54.2 mL/min (by C-G formula based on SCr of 0.6 mg/dL). Liver Function Tests: No results for input(s): AST, ALT, ALKPHOS,  BILITOT, PROT, ALBUMIN in the last 168 hours. No results for input(s): LIPASE, AMYLASE in the last 168 hours. No results for input(s): AMMONIA in the last 168 hours. Coagulation Profile: No results for input(s): INR, PROTIME in the last 168 hours. Cardiac Enzymes: No results for input(s): CKTOTAL, CKMB, CKMBINDEX, TROPONINI in the last 168 hours. BNP (last 3 results) No results for input(s): PROBNP in the last 8760 hours. HbA1C: No results  for input(s): HGBA1C in the last 72 hours. CBG: No results for input(s): GLUCAP in the last 168 hours. Lipid Profile: No results for input(s): CHOL, HDL, LDLCALC, TRIG, CHOLHDL, LDLDIRECT in the last 72 hours. Thyroid Function Tests: No results for input(s): TSH, T4TOTAL, FREET4, T3FREE, THYROIDAB in the last 72 hours. Anemia Panel: No results for input(s): VITAMINB12, FOLATE, FERRITIN, TIBC, IRON, RETICCTPCT in the last 72 hours. Urine analysis: No results found for: COLORURINE, APPEARANCEUR, LABSPEC, PHURINE, GLUCOSEU, HGBUR, BILIRUBINUR, KETONESUR, PROTEINUR, UROBILINOGEN, NITRITE, LEUKOCYTESUR  Creatinine Clearance: Estimated Creatinine Clearance: 54.2 mL/min (by C-G formula based on SCr of 0.6 mg/dL).  Sepsis Labs: '@LABRCNTIP'$ (procalcitonin:4,lacticidven:4) )No results found for this or any previous visit (from the past 240 hour(s)).   Radiological Exams on Admission: Dg Chest 2 View  Result Date: 08/05/2016 CLINICAL DATA:  Central chest pain with shortness of breath EXAM: CHEST  2 VIEW COMPARISON:  None. FINDINGS: Mild hyperinflation. Diffuse coarsening of the lung interstitium suggesting chronic interstitial change although difficult to exclude superimposed acute infiltrate or inflammation. Tiny left effusion or thickening. No focal consolidation. Cystic lucency at the right base. Borderline cardiomegaly. Atherosclerosis. No pneumothorax. Postsurgical changes in the upper abdomen. IMPRESSION: 1. Mild diffuse coarsening of the lung interstitium  suggestive of chronic interstitial change ; difficult to exclude superimposed acute interstitial inflammation or edema without priors. 2. Cystic lucency at the right anterior lung base may represent a large lung cyst or bulla. 3. Borderline cardiomegaly. Electronically Signed   By: Donavan Foil M.D.   On: 08/05/2016 21:18    EKG: Independently reviewed.  NSR with rate 65; nonspecific ST changes with no evidence of acute ischemia  Assessment/Plan Principal Problem:   Acute on chronic respiratory failure with hypoxia (HCC) Active Problems:   Pulmonary fibrosis (HCC)   Hypothyroidism   Hyperglycemia   Acute on chronic respiratory failure, likely resulting from idiopathic pulmonary fibrosis -Prior records are not available at this time; patient reports being seen by [facility] primary care as well as pulmonology -Chronic O2-dependent respiratory failure, uses 2L at rest and 3L with activity -1-2 days of worsening SOB; patient was profoundly SOB with increased WOB and O2 sats to low 80s with minimal ambulation in the ER -VBG 7.394/48.7 -BNP 138.9 -Ideally, this is an exacerbation since that would be expected to respond to treatment.  -She denies infectious symptoms and does not appear to have recent exposures that make this likely. -Influenza pending -Will treat with Duonebs, albuterol prn, and high-dose corticosteroids for now. -Will obtain high-resolution chest CT. -Will request pulmonary consult in the AM. -If this is not an exacerbation, then her disease has begun the rapid progression phase and palliative care or even Hospice may be reasonable.  Hyperglycemia -Glucose 132 -May be stress response -Will follow with fasting AM labs -It is unlikely that he will need acute or chronic treatment for this issue  Hypothyroidism -Check TSH -Continue Synthroid at current dose for now   DVT prophylaxis:  Lovenox Code Status:  Full - confirmed with patient/family Family Communication: Son and  sister(?) present throughout evaluation Disposition Plan:  Home once clinically improved Consults called: None - will need pulmonary in AM  Admission status: Admit - It is my clinical opinion that admission to INPATIENT is reasonable and necessary because this patient will require at least 2 midnights in the hospital to treat this condition based on the medical complexity of the problems presented.  Given the aforementioned information, the predictability of an adverse outcome is felt to be significant.  Karmen Bongo MD [facility]  If 7PM-7AM, please contact night-coverage [email or web address] Password [email or web address]  08/06/2016, 12:55 AM

## 2016-08-06 NOTE — Consult Note (Signed)
Name: Dawn Gomez MRN: [id number] DOB: [birth year]    ADMISSION DATE:  08/05/2016 CONSULTATION DATE:  08/06/2016  REFERRING MD :  Dr. Wynetta Emery  CHIEF COMPLAINT:  SOB  HISTORY OF PRESENT ILLNESS:    Of note, this patient has two charts.  Please see chart with MRN [id number].    This is a 81 year old female known to our practice, followed by Dr. Lake Bells.  She was last seen in our office July 16, 2016 for follow-up of her interstitial lung disease and chronic hypoxic respiratory failure, O2 dependent on 2-3L, and former smoker (quit 10 years ago/ 100 pack yr hx). We have thus far been unable to diagnosis the etiology of her lung disease specifically.    She was admitted on 5/1 with progressive shortness of breath for the last 2 days (4/29) at rest and with exertion.  She was in her usual state of health prior to that.  She denied fever, chills, worsening cough, sputum production, swelling, hemoptysis, or chest pain.  She presented to the ER with this complaint of 4/30.  She was found to be hypoxic at rest at 87% on 2L, worse with increased work of breathing with exertion.  She admitted to [facility] for further hypoxic respiratory failure secondary to likley IPF exacerbation.  She was treated with duonebs, and placed high-dose steroids.  Influenza panel negative.  HRCT was done which demonstrated findings suggestive of NSIP pattern.  Patient additionally admits to living in a constantly damp house with black mold and possibly asbestos contamination.  PCCM consulted for further pulmonary recommendations.   PAST MEDICAL HISTORY :   has a past medical history of Aortic aneurysm ([place]); Pulmonary fibrosis ([place]); and Thyroid disease.  has a past surgical history that includes Abdominal hysterectomy; Hernia repair; Appendectomy; Craniotomy (1980 x 2); and Cholecystectomy. Prior to Admission medications   Medication Sig Start Date End Date Taking? Authorizing Provider  aspirin EC 81 MG tablet Take 81 mg by mouth  daily.   Yes Historical Provider, MD  CALCIUM-MAGNESIUM-ZINC PO Take 1 tablet by mouth daily.   Yes Historical Provider, MD  cholecalciferol (VITAMIN D) 1000 units tablet Take 5,000 Units by mouth daily.   Yes Historical Provider, MD  diltiazem (CARDIZEM CD) 180 MG 24 hr capsule Take 180 mg by mouth daily.   Yes Historical Provider, MD  escitalopram (LEXAPRO) 20 MG tablet Take 20 mg by mouth at bedtime.   Yes Historical Provider, MD  fenofibrate (TRICOR) 145 MG tablet Take 145 mg by mouth daily.   Yes Historical Provider, MD  furosemide (LASIX) 40 MG tablet Take 40 mg by mouth daily.   Yes Historical Provider, MD  levothyroxine (SYNTHROID, LEVOTHROID) 50 MCG tablet Take 50 mcg by mouth daily before breakfast.   Yes Historical Provider, MD  lisinopril (PRINIVIL,ZESTRIL) 5 MG tablet Take 5 mg by mouth daily.   Yes Historical Provider, MD  potassium chloride SA (K-DUR,KLOR-CON) 20 MEQ tablet Take 20 mEq by mouth every morning.   Yes Historical Provider, MD  vitamin B-12 (CYANOCOBALAMIN) 1000 MCG tablet Take 500 mcg by mouth daily.   Yes Historical Provider, MD  vitamin C (ASCORBIC ACID) 500 MG tablet Take 500 mg by mouth daily.   Yes Historical Provider, MD  vitamin E 400 UNIT capsule Take 200 Units by mouth daily.   Yes Historical Provider, MD   Allergies  Allergen Reactions  . Lipitor [Atorvastatin] Shortness Of Breath, Diarrhea and Other (See Comments)    Dizzy, pain all over.  FAMILY HISTORY:  family history is not on file. SOCIAL HISTORY:  reports that she quit smoking about 10 years ago. She has a 100.00 pack-year smoking history. She has never used smokeless tobacco. She reports that she drinks alcohol. She reports that she does not use drugs.  REVIEW OF SYSTEMS:  POSITIVES IN BOLD Constitutional: Negative for fever, chills, weight loss, malaise/fatigue and diaphoresis.  HENT: Negative for hearing loss, ear pain, nosebleeds, congestion, sore throat, neck pain, tinnitus and ear  discharge.   Eyes: Negative for blurred vision, double vision, photophobia, pain, discharge and redness.  Respiratory: Negative for cough, hemoptysis, sputum production, shortness of breath, wheezing and stridor.   Cardiovascular: Negative for chest pain, palpitations, orthopnea, claudication, leg swelling and PND.  Gastrointestinal: Negative for heartburn, nausea, vomiting, abdominal pain, diarrhea, constipation, blood in stool and melena.  Genitourinary: Negative for dysuria, urgency, frequency, hematuria and flank pain.  Musculoskeletal: Negative for myalgias, back pain, joint pain and falls.  Skin: Negative for itching and rash.  Neurological: Negative for dizziness, tingling, tremors, sensory change, speech change, focal weakness, seizures, loss of consciousness, weakness and headaches.  Endo/Heme/Allergies: Negative for environmental allergies and polydipsia. Does not bruise/bleed easily.  SUBJECTIVE:   VITAL SIGNS: Temp:  [97.7 F (36.5 C)-98.1 F (36.7 C)] 98.1 F (36.7 C) (05/01 0457) Pulse Rate:  [50-82] 82 (05/01 0751) Resp:  [16-23] 17 (05/01 0751) BP: (114-143)/(49-64) 131/55 (05/01 0902) SpO2:  [69 %-97 %] 69 % (05/01 0751) FiO2 (%):  [36 %] 36 % (05/01 0213) Weight:  [168 lb (76.2 kg)] 168 lb (76.2 kg) (04/30 2000)  PHYSICAL EXAMINATION: General:  Obese elderly female in NAD HEENT: MM pink/moist, [place]-AT, PERRL, no JVD Neuro: AOx3, non-focal  CV: s1s2 rrr, no m/r/g PULM: even/ mildly labored on 3L [place] with sats mid 90's, lungs bilaterally diminished, speaking full sentences QJ:FHLK, non-tender, bsx4 active  Extremities: warm/dry, no peripheral edema  Skin: no rashes or lesions   Recent Labs Lab 08/05/16 2009 08/06/16 0326  NA 139 140  K 3.8 3.3*  CL 103 104  CO2 25 28  BUN 7 6  CREATININE 0.60 0.62  GLUCOSE 132* 147*    Recent Labs Lab 08/05/16 2009 08/06/16 0326  HGB 11.6* 11.4*  HCT 35.2* 34.8*  WBC 5.5 4.8  PLT 178 154   Dg Chest 2  View  Result Date: 08/05/2016 CLINICAL DATA:  Central chest pain with shortness of breath EXAM: CHEST  2 VIEW COMPARISON:  None. FINDINGS: Mild hyperinflation. Diffuse coarsening of the lung interstitium suggesting chronic interstitial change although difficult to exclude superimposed acute infiltrate or inflammation. Tiny left effusion or thickening. No focal consolidation. Cystic lucency at the right base. Borderline cardiomegaly. Atherosclerosis. No pneumothorax. Postsurgical changes in the upper abdomen. IMPRESSION: 1. Mild diffuse coarsening of the lung interstitium suggestive of chronic interstitial change ; difficult to exclude superimposed acute interstitial inflammation or edema without priors. 2. Cystic lucency at the right anterior lung base may represent a large lung cyst or bulla. 3. Borderline cardiomegaly. Electronically Signed   By: Donavan Foil M.D.   On: 08/05/2016 21:18   Ct Chest High Resolution  Result Date: 08/06/2016 CLINICAL DATA:  Acute on chronic respiratory failure. Reported history of pulmonary fibrosis. Inpatient. EXAM: CT CHEST WITHOUT CONTRAST TECHNIQUE: Multidetector CT imaging of the chest was performed following the standard protocol without intravenous contrast. High resolution imaging of the lungs, as well as inspiratory and expiratory imaging, was performed. COMPARISON:  Chest radiograph from one day prior. FINDINGS:  Cardiovascular: Top-normal heart size. No significant pericardial fluid/thickening. Left anterior descending, left circumflex and right coronary atherosclerosis. Atherosclerotic nonaneurysmal thoracic aorta. Normal caliber pulmonary arteries. Mediastinum/Nodes: No discrete thyroid nodules. Unremarkable esophagus. No axillary adenopathy. Mildly enlarged 1.4 cm AP window node (series 5/ image 38). No additional pathologically enlarged mediastinal or gross hilar nodes on this noncontrast scan. Lungs/Pleura: No pneumothorax. Small dependent bilateral pleural  effusions, left greater than right. Mild centrilobular and paraseptal emphysema. Focal bullous emphysema in the basilar right middle lobe. Left upper lobe 6 mm solid pulmonary nodule (series 6/ image 23). Minimal compressive atelectasis in the dependent lower lobes. Otherwise no acute consolidative airspace disease, lung masses or additional significant pulmonary nodules. No significant lobular air trapping on the expiration sequence. There is mild patchy reticulation involving the peribronchovascular and subpleural lungs with associated mild traction bronchiectasis and architectural distortion, without a clear basilar gradient. No frank honeycombing. Upper abdomen: Unremarkable. Musculoskeletal: No aggressive appearing focal osseous lesions. Marked thoracic spondylosis. IMPRESSION: 1. Spectrum of findings suggestive of interstitial lung disease, with peribronchovascular and subpleural reticulation, mild traction bronchiectasis, mild architectural distortion and no frank honeycombing. No clear basilar gradient. Findings favor nonspecific interstitial pneumonia (NSIP). Usual interstitial pneumonia (UIP) is less likely but not entirely excluded. Consider a follow-up high-resolution chest CT in 6-12 months to assess temporal pattern stability, as clinically warranted. 2. Small dependent bilateral pleural effusions, left greater than right. 3. Left upper lobe 6 mm solid pulmonary nodule. Non-contrast chest CT at 6-12 months is recommended. If the nodule is stable at time of repeat CT, then future CT at 18-24 months (from today's scan) is considered optional for low-risk patients, but is recommended for high-risk patients. This recommendation follows the consensus statement: Guidelines for Management of Incidental Pulmonary Nodules Detected on CT Images: From the Fleischner Society 2017; Radiology 2017; 284:228-243. 4. Mild mediastinal lymphadenopathy, nonspecific, probably reactive. 5. Mild centrilobular and paraseptal  emphysema. Focal bullous emphysema in the basilar right middle lobe. 6. Aortic atherosclerosis.  Three-vessel coronary atherosclerosis. Electronically Signed   By: Ilona Sorrel M.D.   On: 08/06/2016 12:12   SIGNIFICANT EVENTS  5/1 Admit for SOB  STUDIES:  LABS  2009:  ANA, RF, ACE, ESR, all ok.  Autoimmune 01/2014:  Negative 04/2015 ANA, RF, CCP, JO-1, SSA, SSB, SCL-70, Anti-centromere all negative  PFT's  10/2010:  No obstruction, TLC 3.05 (73% pred), DLCO 6.1 (31%) PFT's 2013:  No obstruction, TLC 3.05 (73%), DLCO 6.3 (31%) PFT's 03/2013:  FEV1 1.52 (87%), ratio 88, FVC 1.73 (74%), TLC 2.80 (62%), DLCO 7.22 (38%) PFT's 01/2014:  FEV1 1.54 (90%), ratio 87, FVC 1.78 (76%), TLC 2.90 (65%), DLCO 6.22 (32%) 04/2015 PFT> FVC 1.67 L (73% predicted), total lung capacity 2.76 L (62% predicted), DLCO 6.50 (34% predicted). July 2017 pulmonary function testing ratio 89%, FVC 1.72 L 76% predicted, total lung capacity 3.08 L, 69% predicted, DLCO 5.89 31% predicte  Imaging: CXR 10/2010:  Stable interstitial changes. CXR 2013:  Stable IS changes CT chest 2014:  Subpleural reticulation, ?early honeycombing, per chest radiology >> no definite GGO, may be UIP or maybe not.  HRCT 01/2014:  Slight progression of ISLD, and pattern suggestive of UIP 03/2015 CT chest , stable fibrosis since 2014  HRCT 08/06/2016:  Spectrum of findings suggestive of interstitial lung disease, with peribronchovascular and subpleural reticulation, mild traction bronchiectasis, mild architectural distortion and no frank honeycombing. No clear basilar gradient. Findings favor NSIP. UIP is less likely but not entirely excluded.  Small bilateral pleural effusions, LUL  6 mm pulmonary nodule.  Mild centrilobular and paraseptal emphysema, focal bullous emphysema in basilar RML  Other: ONO on RA with cpap 06/2012:  Low sat 85%, only 7 min less than 88% entire night.  Ambul ox 07/2013:  desat to 87% transiently.  Pt would like to hold off on  portable oxygen   Echo: TTE 06/2012:  Normal LV, DD, normal RV, no evidence for pulmonary htn.  ASSESSMENT / PLAN:  Interstitial lung disease with acute flare- uncertain etiology  She has had increased WOB with increased O2 needs.  She has no infectious prodrome, flu neg, Wells score 0, and does not appear to be volume overloaded.   P: Titrate oxygen for sats 90-95%. (Currently maintaining sats >92 on 3L [place].) PRN bronchodilators Change steroids to Solumedrol 43m q 6 hours x 3 days. Then prednisone taper to off over one week.  If this is truly NSIP she may be a candidate for pirfenidone CSW consulted by primary team to see about her living arrangements, would like to get higher capacity portable concentrator.  Continue supportive care for this flare in hopes that she will be able to recover to her functional baseline and O2 demands Arrange for outpatient follow-up with TPatricia Nettle NP, May 22 at 1430.    Chronic Hypoxic Respiratory Failure - home 2L O2 [place] baseline P: Continue to titrate O2 as above  Left Upper Lobe Lung Nodule - 6 mm on HRCT 5/1 P:  Address as outpatient  OSA on CPAP HS P: Will clarify with patient if she still uses this   Chronic loculated right pneumothorax - noted since 2014 on Chest CT.  Focal bullae noted on 5/1 HRCT P: Monitor  PCCM will sign off and be available as needed.  Please call back if needed.   BKennieth Rad AGACNP-BC [facility] Pgr: [phone] if no answer [phone]/04/2016, 2:21 PM  Attending Note:  81year old female with diagnosis of IPF followed by Dr. MLake Bellsas outpatient.  Patient presenting with worsening SOB.  On exam, lungs are clear.  I reviewed CT myself, some fibrotic changes noted but not severe.  Discussed with PCCM-NP.  IPF:  - Change steroid to 60 mg IV q6 solumedrol for 3 days then taper over a week time.  - F/u as outpatient with Dr. MLake Bells - Autoimmune work up ordered.  Chronic  hypoxemic respiratory failure:  - Titrate O2 for sat of 88-92%  - May need a titration study prior to discharge to evaluate O2 needs.  LUL Nodule  - F/u CT per radiology recommendations  - Arrange for outpatient f/u.  OSA:   - CPAP QHS  - Outpatient f/u.  Loculated right sided PTX: chronic since 2014.  - F/U if clinically changed.  PCCM will sign off, please call back if needed.  Patient seen and examined, agree with above note.  I dictated the care and orders written for this patient under my direction.  WRush Farmer MD [phone]

## 2016-08-07 DIAGNOSIS — J8410 Pulmonary fibrosis, unspecified: Secondary | ICD-10-CM

## 2016-08-07 DIAGNOSIS — R739 Hyperglycemia, unspecified: Secondary | ICD-10-CM

## 2016-08-07 LAB — CBC
HCT: 33.7 % — ABNORMAL LOW (ref 36.0–46.0)
Hemoglobin: 10.9 g/dL — ABNORMAL LOW (ref 12.0–15.0)
MCH: 30 pg (ref 26.0–34.0)
MCHC: 32.3 g/dL (ref 30.0–36.0)
MCV: 92.8 fL (ref 78.0–100.0)
PLATELETS: 169 10*3/uL (ref 150–400)
RBC: 3.63 MIL/uL — ABNORMAL LOW (ref 3.87–5.11)
RDW: 14.2 % (ref 11.5–15.5)
WBC: 10.7 10*3/uL — AB (ref 4.0–10.5)

## 2016-08-07 LAB — BASIC METABOLIC PANEL
ANION GAP: 8 (ref 5–15)
BUN: 14 mg/dL (ref 6–20)
CALCIUM: 9 mg/dL (ref 8.9–10.3)
CO2: 26 mmol/L (ref 22–32)
Chloride: 104 mmol/L (ref 101–111)
Creatinine, Ser: 0.66 mg/dL (ref 0.44–1.00)
Glucose, Bld: 166 mg/dL — ABNORMAL HIGH (ref 65–99)
Potassium: 4.7 mmol/L (ref 3.5–5.1)
SODIUM: 138 mmol/L (ref 135–145)

## 2016-08-07 MED ORDER — POTASSIUM CHLORIDE CRYS ER 20 MEQ PO TBCR
20.0000 meq | EXTENDED_RELEASE_TABLET | Freq: Once | ORAL | Status: AC
  Administered 2016-08-07: 20 meq via ORAL
  Filled 2016-08-07: qty 1

## 2016-08-07 MED ORDER — FUROSEMIDE 40 MG PO TABS
40.0000 mg | ORAL_TABLET | Freq: Every day | ORAL | Status: DC
  Administered 2016-08-07 – 2016-08-10 (×4): 40 mg via ORAL
  Filled 2016-08-07 (×4): qty 1

## 2016-08-07 MED ORDER — PHENOL 1.4 % MT LIQD
1.0000 | OROMUCOSAL | Status: DC | PRN
  Administered 2016-08-07: 1 via OROMUCOSAL
  Filled 2016-08-07: qty 177

## 2016-08-07 MED ORDER — BLISTEX MEDICATED EX OINT
TOPICAL_OINTMENT | CUTANEOUS | Status: DC | PRN
  Filled 2016-08-07: qty 6.3

## 2016-08-07 NOTE — Evaluation (Signed)
Physical Therapy Evaluation Patient Details Name: Dawn Gomez MRN: [id number] DOB: 1935-07-27 Today's Date: 08/07/2016   History of Present Illness  Pt is a 81 y/o female with PMH significant for pulmonary fibrosis admitted with worsening SOB  Clinical Impression  Pt tolerated session well, limited somewhat by SOB.  O2 following amb 40' on 3L was 80%, required increased time and 4L of O2 to return to 92%.  No balance deficits or significant gait deviations noted.  No skilled PT needs.  Recommend progressing ambulation with staff/family as able.      Follow Up Recommendations No PT follow up;Supervision - Intermittent    Equipment Recommendations  None recommended by PT    Recommendations for Other Services       Precautions / Restrictions Precautions Precautions: Other (comment) Precaution Comments: reports occasional dizziness, watch O2 sats Restrictions Weight Bearing Restrictions: No      Mobility  Bed Mobility Overal bed mobility: Independent                Transfers Overall transfer level: Modified independent                  Ambulation/Gait Ambulation/Gait assistance: Modified independent (Device/Increase time) Ambulation Distance (Feet): 40 Feet Assistive device: None   Gait velocity: decreased      Stairs            Wheelchair Mobility    Modified Rankin (Stroke Patients Only)       Balance Overall balance assessment: No apparent balance deficits (not formally assessed)                                           Pertinent Vitals/Pain Pain Assessment: No/denies pain    Home Living Family/patient expects to be discharged to:: Private residence Living Arrangements: Children Available Help at Discharge: Family;Available PRN/intermittently Type of Home: House Home Access: Level entry     Home Layout: One level        Prior Function Level of Independence: Independent               Hand Dominance         Extremity/Trunk Assessment        Lower Extremity Assessment Lower Extremity Assessment: Overall WFL for tasks assessed       Communication   Communication: No difficulties  Cognition Arousal/Alertness: Awake/alert Behavior During Therapy: WFL for tasks assessed/performed Overall Cognitive Status: Within Functional Limits for tasks assessed                                        General Comments      Exercises     Assessment/Plan    PT Assessment Patent does not need any further PT services  PT Problem List         PT Treatment Interventions      PT Goals (Current goals can be found in the Care Plan section)       Frequency     Barriers to discharge        Co-evaluation               AM-PAC PT "6 Clicks" Daily Activity  Outcome Measure Difficulty turning over in bed (including adjusting bedclothes, sheets and blankets)?: None Difficulty moving from lying on back to  sitting on the side of the bed? : None Difficulty sitting down on and standing up from a chair with arms (e.g., wheelchair, bedside commode, etc,.)?: None Help needed moving to and from a bed to chair (including a wheelchair)?: None Help needed walking in hospital room?: A Little Help needed climbing 3-5 steps with a railing? : A Little 6 Click Score: 22    End of Session Equipment Utilized During Treatment: Oxygen Activity Tolerance: Patient limited by fatigue Patient left: in bed;with call bell/phone within reach Nurse Communication: Mobility status PT Visit Diagnosis: Dizziness and giddiness (R42)    Time: 1624-4695 PT Time Calculation (min) (ACUTE ONLY): 25 min   Charges:   PT Evaluation $PT Eval Low Complexity: 1 Procedure PT Treatments $Gait Training: 8-22 mins   PT G Codes:          Keiarra Charon E Penven-Crew 08/07/2016, 3:06 PM

## 2016-08-07 NOTE — Progress Notes (Signed)
PROGRESS NOTE    Dawn Gomez  MCN:[id number] DOB: October 28, 1935 DOA: 08/05/2016 PCP: Mauricio Po, FNP  Brief Narrative:This is a 81 year old female with interstitial lung disease and chronic hypoxic respiratory failure, O2 dependent on 2-3L, and former smoker (quit 10 years ago/ 100 pack yr hx).She was admitted on 5/1 with progressive shortness of breath for the last 2 days (4/29) at rest and with exertion  Assessment & Plan: Acute on chronic respiratory failure, likely resulting from idiopathic pulmonary fibrosis -Chronic O2-dependent respiratory failure, uses 2L at rest and 3L with activity -high-resolution chest CT-interstitial lung disease, with peribronchovascular and subpleural reticulation, mild traction bronchiectasis, mild architectural distortion and no frank, honeycombing  -appreciate pulmonary consult, continue IV solumedrol for 3days and then change to prednisone taper and FU with Dr.McQuaid -Ambulate, PT eval  Chronic diastolic CHF -resume Po lasix  Hyperglycemia -due to stress likely -check Hba1c  Hypothyroidism -TSH normal -Continue Synthroid at current dose for now  DVT prophylaxis:  Lovenox Code Status:  Full - confirmed with patient/family Family Communication: none at bedside Disposition Plan:  Home on Friday if stable  Consultants:   PCCM  Subjective: Breathing improving  Objective: Vitals:   08/06/16 2110 08/07/16 0455 08/07/16 0948 08/07/16 1026  BP: (!) 102/47 (!) 120/54 118/60   Pulse: 81 (!) 59    Resp: 18 17    Temp: 97.9 F (36.6 C) 98.2 F (36.8 C)    TempSrc: Oral Oral    SpO2: 100% 96%  91%  Weight:      Height:        Intake/Output Summary (Last 24 hours) at 08/07/16 1436 Last data filed at 08/07/16 0917  Gross per 24 hour  Intake              240 ml  Output              750 ml  Net             -510 ml   Filed Weights   08/05/16 2000  Weight: 76.2 kg (168 lb)    Examination: Awake Alert, Oriented X 3, no  distress HEENT: PERRLA, Supple Neck,No JVD Lungs: Symmetrical Chest wall movement, Good air movement bilaterally, CTAB CVS: RRR,No Gallops,Rubs or new Murmurs Abd: +ve B.Sounds, Abd Soft, No tenderness, No organomegaly appriciated, No rebound - guarding or rigidity. Ext: no edema    Data Reviewed:   CBC:  Recent Labs Lab 08/05/16 2009 08/06/16 0326 08/07/16 0538  WBC 5.5 4.8 10.7*  NEUTROABS 3.9  --   --   HGB 11.6* 11.4* 10.9*  HCT 35.2* 34.8* 33.7*  MCV 93.1 93.0 92.8  PLT 178 154 629   Basic Metabolic Panel:  Recent Labs Lab 08/05/16 2009 08/06/16 0326 08/07/16 0538  NA 139 140 138  K 3.8 3.3* 4.7  CL 103 104 104  CO2 '25 28 26  '$ GLUCOSE 132* 147* 166*  BUN '7 6 14  '$ CREATININE 0.60 0.62 0.66  CALCIUM 8.4* 8.6* 9.0   GFR: Estimated Creatinine Clearance: 54.2 mL/min (by C-G formula based on SCr of 0.66 mg/dL). Liver Function Tests: No results for input(s): AST, ALT, ALKPHOS, BILITOT, PROT, ALBUMIN in the last 168 hours. No results for input(s): LIPASE, AMYLASE in the last 168 hours. No results for input(s): AMMONIA in the last 168 hours. Coagulation Profile: No results for input(s): INR, PROTIME in the last 168 hours. Cardiac Enzymes: No results for input(s): CKTOTAL, CKMB, CKMBINDEX, TROPONINI in the last 168 hours. BNP (  last 3 results) No results for input(s): PROBNP in the last 8760 hours. HbA1C: No results for input(s): HGBA1C in the last 72 hours. CBG:  Recent Labs Lab 08/06/16 1710  GLUCAP 243*   Lipid Profile: No results for input(s): CHOL, HDL, LDLCALC, TRIG, CHOLHDL, LDLDIRECT in the last 72 hours. Thyroid Function Tests:  Recent Labs  08/06/16 0326  TSH 0.986   Anemia Panel: No results for input(s): VITAMINB12, FOLATE, FERRITIN, TIBC, IRON, RETICCTPCT in the last 72 hours. Urine analysis: No results found for: COLORURINE, APPEARANCEUR, LABSPEC, PHURINE, GLUCOSEU, HGBUR, BILIRUBINUR, KETONESUR, PROTEINUR, UROBILINOGEN, NITRITE,  LEUKOCYTESUR Sepsis Labs: '@LABRCNTIP'$ (procalcitonin:4,lacticidven:4)  )No results found for this or any previous visit (from the past 240 hour(s)).       Radiology Studies: Dg Chest 2 View  Result Date: 08/05/2016 CLINICAL DATA:  Central chest pain with shortness of breath EXAM: CHEST  2 VIEW COMPARISON:  None. FINDINGS: Mild hyperinflation. Diffuse coarsening of the lung interstitium suggesting chronic interstitial change although difficult to exclude superimposed acute infiltrate or inflammation. Tiny left effusion or thickening. No focal consolidation. Cystic lucency at the right base. Borderline cardiomegaly. Atherosclerosis. No pneumothorax. Postsurgical changes in the upper abdomen. IMPRESSION: 1. Mild diffuse coarsening of the lung interstitium suggestive of chronic interstitial change ; difficult to exclude superimposed acute interstitial inflammation or edema without priors. 2. Cystic lucency at the right anterior lung base may represent a large lung cyst or bulla. 3. Borderline cardiomegaly. Electronically Signed   By: Donavan Foil M.D.   On: 08/05/2016 21:18   Ct Chest High Resolution  Result Date: 08/06/2016 CLINICAL DATA:  Acute on chronic respiratory failure. Reported history of pulmonary fibrosis. Inpatient. EXAM: CT CHEST WITHOUT CONTRAST TECHNIQUE: Multidetector CT imaging of the chest was performed following the standard protocol without intravenous contrast. High resolution imaging of the lungs, as well as inspiratory and expiratory imaging, was performed. COMPARISON:  Chest radiograph from one day prior. FINDINGS: Cardiovascular: Top-normal heart size. No significant pericardial fluid/thickening. Left anterior descending, left circumflex and right coronary atherosclerosis. Atherosclerotic nonaneurysmal thoracic aorta. Normal caliber pulmonary arteries. Mediastinum/Nodes: No discrete thyroid nodules. Unremarkable esophagus. No axillary adenopathy. Mildly enlarged 1.4 cm AP window  node (series 5/ image 38). No additional pathologically enlarged mediastinal or gross hilar nodes on this noncontrast scan. Lungs/Pleura: No pneumothorax. Small dependent bilateral pleural effusions, left greater than right. Mild centrilobular and paraseptal emphysema. Focal bullous emphysema in the basilar right middle lobe. Left upper lobe 6 mm solid pulmonary nodule (series 6/ image 23). Minimal compressive atelectasis in the dependent lower lobes. Otherwise no acute consolidative airspace disease, lung masses or additional significant pulmonary nodules. No significant lobular air trapping on the expiration sequence. There is mild patchy reticulation involving the peribronchovascular and subpleural lungs with associated mild traction bronchiectasis and architectural distortion, without a clear basilar gradient. No frank honeycombing. Upper abdomen: Unremarkable. Musculoskeletal: No aggressive appearing focal osseous lesions. Marked thoracic spondylosis. IMPRESSION: 1. Spectrum of findings suggestive of interstitial lung disease, with peribronchovascular and subpleural reticulation, mild traction bronchiectasis, mild architectural distortion and no frank honeycombing. No clear basilar gradient. Findings favor nonspecific interstitial pneumonia (NSIP). Usual interstitial pneumonia (UIP) is less likely but not entirely excluded. Consider a follow-up high-resolution chest CT in 6-12 months to assess temporal pattern stability, as clinically warranted. 2. Small dependent bilateral pleural effusions, left greater than right. 3. Left upper lobe 6 mm solid pulmonary nodule. Non-contrast chest CT at 6-12 months is recommended. If the nodule is stable at time of repeat CT, then  future CT at 18-24 months (from today's scan) is considered optional for low-risk patients, but is recommended for high-risk patients. This recommendation follows the consensus statement: Guidelines for Management of Incidental Pulmonary Nodules  Detected on CT Images: From the Fleischner Society 2017; Radiology 2017; 284:228-243. 4. Mild mediastinal lymphadenopathy, nonspecific, probably reactive. 5. Mild centrilobular and paraseptal emphysema. Focal bullous emphysema in the basilar right middle lobe. 6. Aortic atherosclerosis.  Three-vessel coronary atherosclerosis. Electronically Signed   By: Ilona Sorrel M.D.   On: 08/06/2016 12:12        Scheduled Meds: . aspirin EC  81 mg Oral Daily  . diltiazem  180 mg Oral Daily  . docusate sodium  100 mg Oral BID  . enoxaparin (LOVENOX) injection  40 mg Subcutaneous Q24H  . escitalopram  20 mg Oral QHS  . fenofibrate  160 mg Oral Daily  . furosemide  40 mg Oral Daily  . ipratropium-albuterol  3 mL Nebulization TID  . levothyroxine  50 mcg Oral QAC breakfast  . lisinopril  5 mg Oral Daily  . methylPREDNISolone (SOLU-MEDROL) injection  60 mg Intravenous Q6H   Continuous Infusions:   LOS: 1 day   Time spent: 32mn  PDomenic Polite MD [facility] Pager [phone] If 7PM-7AM, please contact night-coverage [email or web address] Password [email or web address] 08/07/2016, 2:36 PM

## 2016-08-08 MED ORDER — METHYLPREDNISOLONE SODIUM SUCC 125 MG IJ SOLR
60.0000 mg | Freq: Two times a day (BID) | INTRAMUSCULAR | Status: DC
  Administered 2016-08-08 – 2016-08-09 (×2): 60 mg via INTRAVENOUS
  Filled 2016-08-08 (×2): qty 2

## 2016-08-08 NOTE — Progress Notes (Signed)
PROGRESS NOTE    Dawn Gomez  FTD:[id number] DOB: 1936-01-01 DOA: 08/05/2016 PCP: Mauricio Po, FNP  Brief Narrative:This is a 81 year old female with interstitial lung disease and chronic hypoxic respiratory failure, O2 dependent on 2-3L, and former smoker (quit 10 years ago/ 100 pack yr hx).She was admitted on 5/1 with progressive shortness of breath for the last 2 days (4/29) at rest and with exertion  Assessment & Plan: Acute on chronic respiratory failure, likely resulting from idiopathic pulmonary fibrosis -Chronic O2-dependent respiratory failure, uses 2L at rest and 3L with activity -high-resolution chest CT-interstitial lung disease, with peribronchovascular and subpleural reticulation, mild traction bronchiectasis, mild architectural distortion and no frank, honeycombing  -appreciate pulmonary consult, recommended IV solumedrol for 3days and then change to prednisone taper and FU with Dr.McQuaid, I will cut down solumedrol today, change to prednisone tomorrow -Ambulate, PT eval  Chronic diastolic CHF -resume Po lasix  Hyperglycemia -due to stress likely -check Hba1c  Hypothyroidism -TSH normal -Continue Synthroid at current dose for now  DVT prophylaxis:  Lovenox Code Status:  Full - confirmed with patient/family Family Communication: none at bedside Disposition Plan:  Home on Friday if stable  Consultants:   PCCM  Subjective: Breathing improving, not back to baseline yet  Objective: Vitals:   08/07/16 1636 08/07/16 1944 08/08/16 0556 08/08/16 1002  BP: (!) 110/39  129/60   Pulse: 67  62   Resp: 17  18   Temp: 97.3 F (36.3 C)  98 F (36.7 C)   TempSrc:      SpO2: 96% 96% 93% 92%  Weight:      Height:       No intake or output data in the 24 hours ending 08/08/16 1448 Filed Weights   08/05/16 2000  Weight: 76.2 kg (168 lb)    Examination: Awake Alert, Oriented X 3, no distress HEENT: PERRLA, Supple Neck,No JVD Lungs: fine basilar  crackles CVS: RRR,No Gallops,Rubs or new Murmurs Abd: +ve B.Sounds, Abd Soft, No tenderness, No organomegaly appriciated, No rebound - guarding or rigidity. Ext: no edema    Data Reviewed:   CBC:  Recent Labs Lab 08/05/16 2009 08/06/16 0326 08/07/16 0538  WBC 5.5 4.8 10.7*  NEUTROABS 3.9  --   --   HGB 11.6* 11.4* 10.9*  HCT 35.2* 34.8* 33.7*  MCV 93.1 93.0 92.8  PLT 178 154 062   Basic Metabolic Panel:  Recent Labs Lab 08/05/16 2009 08/06/16 0326 08/07/16 0538  NA 139 140 138  K 3.8 3.3* 4.7  CL 103 104 104  CO2 '25 28 26  '$ GLUCOSE 132* 147* 166*  BUN '7 6 14  '$ CREATININE 0.60 0.62 0.66  CALCIUM 8.4* 8.6* 9.0   GFR: Estimated Creatinine Clearance: 54.2 mL/min (by C-G formula based on SCr of 0.66 mg/dL). Liver Function Tests: No results for input(s): AST, ALT, ALKPHOS, BILITOT, PROT, ALBUMIN in the last 168 hours. No results for input(s): LIPASE, AMYLASE in the last 168 hours. No results for input(s): AMMONIA in the last 168 hours. Coagulation Profile: No results for input(s): INR, PROTIME in the last 168 hours. Cardiac Enzymes: No results for input(s): CKTOTAL, CKMB, CKMBINDEX, TROPONINI in the last 168 hours. BNP (last 3 results) No results for input(s): PROBNP in the last 8760 hours. HbA1C: No results for input(s): HGBA1C in the last 72 hours. CBG:  Recent Labs Lab 08/06/16 1710  GLUCAP 243*   Lipid Profile: No results for input(s): CHOL, HDL, LDLCALC, TRIG, CHOLHDL, LDLDIRECT in the last 72 hours.  Thyroid Function Tests:  Recent Labs  08/06/16 0326  TSH 0.986   Anemia Panel: No results for input(s): VITAMINB12, FOLATE, FERRITIN, TIBC, IRON, RETICCTPCT in the last 72 hours. Urine analysis: No results found for: COLORURINE, APPEARANCEUR, LABSPEC, PHURINE, GLUCOSEU, HGBUR, BILIRUBINUR, KETONESUR, PROTEINUR, UROBILINOGEN, NITRITE, LEUKOCYTESUR Sepsis Labs: '@LABRCNTIP'$ (procalcitonin:4,lacticidven:4)  )No results found for this or any previous visit  (from the past 240 hour(s)).       Radiology Studies: No results found.      Scheduled Meds: . aspirin EC  81 mg Oral Daily  . diltiazem  180 mg Oral Daily  . docusate sodium  100 mg Oral BID  . enoxaparin (LOVENOX) injection  40 mg Subcutaneous Q24H  . escitalopram  20 mg Oral QHS  . fenofibrate  160 mg Oral Daily  . furosemide  40 mg Oral Daily  . ipratropium-albuterol  3 mL Nebulization TID  . levothyroxine  50 mcg Oral QAC breakfast  . lisinopril  5 mg Oral Daily  . methylPREDNISolone (SOLU-MEDROL) injection  60 mg Intravenous Q12H   Continuous Infusions:   LOS: 2 days   Time spent: 27mn  PDomenic Polite MD [facility] Pager [phone] If 7PM-7AM, please contact night-coverage [email or web address] Password [email or web address] 08/08/2016, 2:48 PM

## 2016-08-08 NOTE — Plan of Care (Signed)
Problem: Food- and Nutrition-Related Knowledge Deficit (NB-1.1) Goal: Nutrition education Formal process to instruct or train a patient/client in a skill or to impart knowledge to help patients/clients voluntarily manage or modify food choices and eating behavior to maintain or improve health. Outcome: Completed/Met Date Met: 08/08/16 Nutrition Education Note  RD consulted for diet education.  During time of visit, pt requested information regarding 1500 calorie diet as pt actively trying to lose weight. Handout "1500 Calorie Diet 5 day Sample Menu" from the Academy of Nutrition and Dietetics Manual was given. Discussed foods recommended and not recommended for the patient. Additionally discussed patient's food preferences and how she can incorporate it in her diet. Teach back method used.   Expect good compliance.  Pt is currently on a regular diet with 100% meal completion. Labs and medications reviewed. No further interventions warranted at this time. Re-consult RD was needed.   Corrin Parker, MS, RD, LDN Pager # [phone] After hours/ weekend pager # [phone]

## 2016-08-08 NOTE — Evaluation (Signed)
Occupational Therapy Evaluation and Discharge Patient Details Name: Dawn Gomez MRN: [id number] DOB: [birth year] Today's Date: 08/08/2016    History of Present Illness Pt is a 81 y/o female with PMH significant for pulmonary fibrosis admitted with worsening SOB   Clinical Impression   Pt reports she was independent with ADL PTA. Currently pt overall mod I for ADL and functional mobility. SpO2=79% on 4L with activity; quickly rebounded to high 90s with seated rest and pursed lip breathing. Educated pt on energy conservation strategies and mobility with RN staff while in hospital. Pt planning to d/c home with intermittent supervision from her son. No further acute OT needs identified; signing off at this time. Please re-consult if needs change. Thank you for this referral.    Follow Up Recommendations  No OT follow up;Supervision - Intermittent    Equipment Recommendations  Tub/shower seat    Recommendations for Other Services       Precautions / Restrictions Precautions Precaution Comments: watch O2 Restrictions Weight Bearing Restrictions: No      Mobility Bed Mobility               General bed mobility comments: Pt sitting EOB upon arrival.  Transfers Overall transfer level: Modified independent Equipment used: None                  Balance Overall balance assessment: No apparent balance deficits (not formally assessed)                                         ADL either performed or assessed with clinical judgement   ADL Overall ADL's : Modified independent                                       General ADL Comments: Pt able to perform toilet transfer and ADL at mod I level. Educated pt on energy conservation strategies and pursed lip breathing. SpO2=79% on 4L following activity; quickly rebounded to high 90s with seated rest and pursed lip breathing.     Vision         Perception     Praxis      Pertinent  Vitals/Pain Pain Assessment: No/denies pain     Hand Dominance     Extremity/Trunk Assessment Upper Extremity Assessment Upper Extremity Assessment: Overall WFL for tasks assessed   Lower Extremity Assessment Lower Extremity Assessment: Defer to PT evaluation   Cervical / Trunk Assessment Cervical / Trunk Assessment: Normal   Communication Communication Communication: No difficulties   Cognition Arousal/Alertness: Awake/alert Behavior During Therapy: WFL for tasks assessed/performed Overall Cognitive Status: Within Functional Limits for tasks assessed                                     General Comments       Exercises     Shoulder Instructions      Home Living Family/patient expects to be discharged to:: Private residence Living Arrangements: Children Available Help at Discharge: Family;Available PRN/intermittently Type of Home: House Home Access: Level entry     Home Layout: One level     Bathroom Shower/Tub: Teacher, early years/pre: Standard     Home Equipment: None   Additional Comments: home  O2 (2-3L)      Prior Functioning/Environment Level of Independence: Independent                 OT Problem List:        OT Treatment/Interventions:      OT Goals(Current goals can be found in the care plan section) Acute Rehab OT Goals Patient Stated Goal: get back to being active and independent OT Goal Formulation: All assessment and education complete, DC therapy  OT Frequency:     Barriers to D/C:            Co-evaluation              AM-PAC PT "6 Clicks" Daily Activity     Outcome Measure Help from another person eating meals?: None Help from another person taking care of personal grooming?: None Help from another person toileting, which includes using toliet, bedpan, or urinal?: None Help from another person bathing (including washing, rinsing, drying)?: None Help from another person to put on and taking  off regular upper body clothing?: None Help from another person to put on and taking off regular lower body clothing?: None 6 Click Score: 24   End of Session Equipment Utilized During Treatment: Oxygen Nurse Communication: Mobility status;Other (comment) (encourage mobility with RN staff, SpO2 with mobility)  Activity Tolerance: Patient tolerated treatment well Patient left: in chair;with call bell/phone within reach  OT Visit Diagnosis: Muscle weakness (generalized) (M62.81)                Time: 5041-3643 OT Time Calculation (min): 21 min Charges:  OT General Charges $OT Visit: 1 Procedure OT Evaluation $OT Eval Moderate Complexity: 1 Procedure G-Codes:     Arielle Eber A. Ulice Brilliant, M.S., OTR/L Pager: [phone]  Binnie Kand 08/08/2016, 12:17 PM

## 2016-08-09 MED ORDER — PREDNISONE 50 MG PO TABS
50.0000 mg | ORAL_TABLET | Freq: Every day | ORAL | Status: DC
  Administered 2016-08-09 – 2016-08-10 (×2): 50 mg via ORAL
  Filled 2016-08-09 (×2): qty 1

## 2016-08-09 NOTE — Progress Notes (Signed)
CSW spoke with patient regarding her housing situation. Patient reported that the house she and her son stay in has mold and other structural damages. CSW advised that we only have emergency shelter housing resources and that patient should ask her son to do an apartment or house search so that they can get away from the mold. Patient agreed. She reported that her son has a new job now and is making more money in addition to the checks they receive. Patient reported appreciation for speaking with her.   CSW signing off.   Percell Locus Sharrod Achille LCSWA [phone]

## 2016-08-09 NOTE — Care Management Note (Addendum)
Case Management Note  Patient Details  Name: Dawn Gomez MRN: [id number] Date of Birth: 03/30/1936  Subjective/Objective:           Admitted with SOB, hx of  interstitial lung disease and chronic hypoxic respiratory failure, O2 dependent on 2-3L, and former smoker (quit 10 years ago/ 100 pack yr hx). Resides with son.  Dawn Gomez)     [phone]       PCP: Elmyra Ricks  Action/Plan: Plan is to d/c to home today with oxygen. Son is to bring portable  oxygen tank in for transportation to home.  Expected Discharge Date:  08/09/16               Expected Discharge Plan:  Home/Self Care  In-House Referral:     Discharge planning Services  CM Consult  Post Acute Care Choice:  Resumption of Svcs/PTA Provider, Durable Medical Equipment Choice offered to:  Patient  DME Arranged:  Oxygen, rollator DME Agency:  [place]., referral made with Brad, 2/2 to liter flow (4l) change for home oxygen. [name] is to bring rollator to bedside prior to discharge.  HH Arranged:    [place] Agency:     Status of Service:  Completed, signed off  If discussed at [place] of Stay Meetings, dates discussed:    Additional Comments: CM received consult for equipment needs. PT/OT evaluations no equipment needs were identified. Rollator ordered and will be delivered to pt's bedside @ pt's request.  Sharin Mons, RN 08/09/2016, 11:49 AM

## 2016-08-09 NOTE — Consult Note (Addendum)
[facility] CM Primary Care Navigator  08/09/2016  Dawn Gomez October 23, 1935 [id number]     Went to see patientat the bedside to identify possible discharge needs. Met with brother Inocente Salles) as well. Patient reports having extreme shortness of breath, "heart pounding hard" and dizziness that had led to this admission. Patient endorses Mauricio Po with [employer] [facility] [place] the primary care provider.   Patient shared using [place] at [facility] obtain medications without difficulty.   Patient manages her own medications at home using "pill box" system.   Patient's son Chrissie Noa) usually provides transportation to her doctors' appointments but now working on his new job. She states that son could still provide transportation for her as schedule permits. Patient voiced concern about transportation at this time. [facility] list of transportation resources provided (per patient's request) to use in case son will not be available to transport her.  Patient reports that son had been her primary caregiver at home. Patient mentioned       that her brother works full time as well. [facility] list of personal care services was provided as aresource when needed, with the understanding that she will pay out of the pocket for it.Patient was grateful for the resource lists provided.    Anticipated discharge plan is home according to patient.  Patient expressed understanding to call primary care provider's officewhen she returns home,for a post discharge follow-up appointment within a week or sooner if needed.Patient letter (with PCP's contact number) was provided as a reminder.  Discussed withpatientabout [facility] CM services available for healthmanagement but she denies any further needs or concerns at this time except for housing situation (house that she and her son stay in has molds and other structural damages). Attempt made to get assistance from [facility]  office and was directed to contact [employer] [facility]) but found out that services are only for homeowner occupied dwellings and patient does not own the house (renting). Patient was grateful for the efforts given to assist her. She mentioned that son will have to search for an apartment or house to move to (will take time) in order to get away from the molds. Patient verbally agreed to a referral to [facility] community care management for further assistance.   For questions, please contact:  Dannielle Huh, BSN, RN- [facility] Primary Care Navigator  Telephone: [phone] [place]

## 2016-08-09 NOTE — Care Management Important Message (Signed)
Important Message  Patient Details  Name: Dawn Gomez MRN: [id number] Date of Birth: 11/28/1935   Medicare Important Message Given:  Yes    [name] May 08/09/2016, 12:32 PM

## 2016-08-10 MED ORDER — PREDNISONE 20 MG PO TABS: 20.0000 mg | ORAL_TABLET | Freq: Every day | ORAL | 0 refills | Status: DC

## 2016-08-10 MED ORDER — IPRATROPIUM-ALBUTEROL 0.5-2.5 (3) MG/3ML IN SOLN: 3.0000 mL | RESPIRATORY_TRACT | 0 refills | Status: AC | PRN

## 2016-08-10 NOTE — Discharge Summary (Signed)
Physician Discharge Summary  Dawn Gomez ZWC:[id number] DOB: 28-Feb-1936 DOA: 08/05/2016  PCP: Golden Circle, FNP  Admit date: 08/05/2016 Discharge date: 08/10/2016  Time spent: 35 minutes  Recommendations for Outpatient Follow-up:  1. PCP Terri Piedra in 1 week 2. Pulm Tammy Parrett 5/22   Discharge Diagnoses:  Principal Problem:   Acute on chronic respiratory failure with hypoxia (HCC)   Pulm Fibrosis [facility]   Pulmonary fibrosis (HCC)   Hypothyroidism   Hyperglycemia   Chronic diastolic CHF  Discharge Condition: stable  Diet recommendation: low sodium heart healthy  Filed Weights   08/05/16 2000  Weight: 76.2 kg (168 lb)    History of present illness:  This is a 81 year old female with interstitial lung disease and chronic hypoxic respiratory failure, O2 dependent on 2-3L, and former smoker (quit 10 years ago/ 100 pack yr hx).She was admitted on 5/1 with progressive shortness of breath for the last 2 days (4/29) at rest and with exertion  Hospital Course:  Acute on chronic respiratory failure, likely resulting from idiopathic pulmonary fibrosis -Chronic O2-dependent respiratory failure, uses 2L at rest and 3L with activity -high-resolution chest CT- noted interstitial lung disease, with peribronchovascular and subpleural reticulation, bronchiectasis, mild architectural distortion and no frank, honeycombing. -Pulmonary consulted, recommended IV solumedrol for 3days and then change to prednisone taper and FU with Dr.McQuaid, this was completed in the hospital, IV solumedrol transitioned to Oral prednisone and she was discharged home in a stable/improved condition to FU with [facility] on 5/22 -Pt eval completed, home health PT set up  Chronic diastolic CHF -resume Po lasix  Hypothyroidism -TSH normal -Continue Synthroid at current dose for now  HTN -stable, continued on lisinopril and diltiazem Consultations:  [facility]  Discharge Exam: Vitals:   08/09/16  2253 08/10/16 0430  BP: (!) 110/46 (!) 104/50  Pulse: 65 (!) 54  Resp: 16 16  Temp: 97.9 F (36.6 C) 97.7 F (36.5 C)    General: AAOx3 Cardiovascular: S1S2/RRR Respiratory: improved air movement, fine crackles  Discharge Instructions   Discharge Instructions    AMB Referral to [facility] Management    Complete by:  As directed    Patient has interstitial lung disease and chronic hypoxic respiratory failure. She is O2 dependent on 2-3L per minute.   Reason for consult:  [facility] community CM referral related to pt's housing situation (house rented by patient and son where they stay in has molds & has other structural damages   Expected date of contact:  1-3 days (reserved for hospital discharges)   Diet - low sodium heart healthy    Complete by:  As directed    Increase activity slowly    Complete by:  As directed      Current Discharge Medication List    START taking these medications   Details  predniSONE (DELTASONE) 20 MG tablet Take 1-2 tablets (20-40 mg total) by mouth daily with breakfast. Take '40mg'$  for 3days then '20mg'$  for 3days then '10mg'$  for 3days then STOP Qty: 12 tablet, Refills: 0      CONTINUE these medications which have NOT CHANGED   Details  aspirin EC 81 MG tablet Take 81 mg by mouth daily.    CALCIUM-MAGNESIUM-ZINC PO Take 1 tablet by mouth daily.    cholecalciferol (VITAMIN D) 1000 units tablet Take 5,000 Units by mouth daily.    diltiazem (CARDIZEM CD) 180 MG 24 hr capsule Take 180 mg by mouth daily.    escitalopram (LEXAPRO) 20 MG tablet  Take 20 mg by mouth at bedtime.    fenofibrate (TRICOR) 145 MG tablet Take 145 mg by mouth daily.    furosemide (LASIX) 40 MG tablet Take 40 mg by mouth daily.    levothyroxine (SYNTHROID, LEVOTHROID) 50 MCG tablet Take 50 mcg by mouth daily before breakfast.    lisinopril (PRINIVIL,ZESTRIL) 5 MG tablet Take 5 mg by mouth daily.    potassium chloride SA (K-DUR,KLOR-CON) 20 MEQ tablet Take 20 mEq by mouth every  morning.    vitamin B-12 (CYANOCOBALAMIN) 1000 MCG tablet Take 500 mcg by mouth daily.    vitamin C (ASCORBIC ACID) 500 MG tablet Take 500 mg by mouth daily.    vitamin E 400 UNIT capsule Take 200 Units by mouth daily.       Allergies  Allergen Reactions  . Lipitor [Atorvastatin] Shortness Of Breath, Diarrhea and Other (See Comments)    Dizzy, pain all over.   Follow-up Information    [facility]. Go on 08/27/2016.   Specialty:  Pulmonology Why:  You have a pulmonary follow-up appointment with Patricia Nettle, NP on Aug 27 2016 at 2:30 PM.   Contact information: [place] [phone]           The results of significant diagnostics from this hospitalization (including imaging, microbiology, ancillary and laboratory) are listed below for reference.    Significant Diagnostic Studies: Dg Chest 2 View  Result Date: 08/05/2016 CLINICAL DATA:  Central chest pain with shortness of breath EXAM: CHEST  2 VIEW COMPARISON:  None. FINDINGS: Mild hyperinflation. Diffuse coarsening of the lung interstitium suggesting chronic interstitial change although difficult to exclude superimposed acute infiltrate or inflammation. Tiny left effusion or thickening. No focal consolidation. Cystic lucency at the right base. Borderline cardiomegaly. Atherosclerosis. No pneumothorax. Postsurgical changes in the upper abdomen. IMPRESSION: 1. Mild diffuse coarsening of the lung interstitium suggestive of chronic interstitial change ; difficult to exclude superimposed acute interstitial inflammation or edema without priors. 2. Cystic lucency at the right anterior lung base may represent a large lung cyst or bulla. 3. Borderline cardiomegaly. Electronically Signed   By: Donavan Foil M.D.   On: 08/05/2016 21:18   Ct Chest High Resolution  Result Date: 08/06/2016 CLINICAL DATA:  Acute on chronic respiratory failure. Reported history of pulmonary fibrosis.  Inpatient. EXAM: CT CHEST WITHOUT CONTRAST TECHNIQUE: Multidetector CT imaging of the chest was performed following the standard protocol without intravenous contrast. High resolution imaging of the lungs, as well as inspiratory and expiratory imaging, was performed. COMPARISON:  Chest radiograph from one day prior. FINDINGS: Cardiovascular: Top-normal heart size. No significant pericardial fluid/thickening. Left anterior descending, left circumflex and right coronary atherosclerosis. Atherosclerotic nonaneurysmal thoracic aorta. Normal caliber pulmonary arteries. Mediastinum/Nodes: No discrete thyroid nodules. Unremarkable esophagus. No axillary adenopathy. Mildly enlarged 1.4 cm AP window node (series 5/ image 38). No additional pathologically enlarged mediastinal or gross hilar nodes on this noncontrast scan. Lungs/Pleura: No pneumothorax. Small dependent bilateral pleural effusions, left greater than right. Mild centrilobular and paraseptal emphysema. Focal bullous emphysema in the basilar right middle lobe. Left upper lobe 6 mm solid pulmonary nodule (series 6/ image 23). Minimal compressive atelectasis in the dependent lower lobes. Otherwise no acute consolidative airspace disease, lung masses or additional significant pulmonary nodules. No significant lobular air trapping on the expiration sequence. There is mild patchy reticulation involving the peribronchovascular and subpleural lungs with associated mild traction bronchiectasis and architectural distortion, without a clear basilar gradient. No frank honeycombing.  Upper abdomen: Unremarkable. Musculoskeletal: No aggressive appearing focal osseous lesions. Marked thoracic spondylosis. IMPRESSION: 1. Spectrum of findings suggestive of interstitial lung disease, with peribronchovascular and subpleural reticulation, mild traction bronchiectasis, mild architectural distortion and no frank honeycombing. No clear basilar gradient. Findings favor nonspecific  interstitial pneumonia (NSIP). Usual interstitial pneumonia (UIP) is less likely but not entirely excluded. Consider a follow-up high-resolution chest CT in 6-12 months to assess temporal pattern stability, as clinically warranted. 2. Small dependent bilateral pleural effusions, left greater than right. 3. Left upper lobe 6 mm solid pulmonary nodule. Non-contrast chest CT at 6-12 months is recommended. If the nodule is stable at time of repeat CT, then future CT at 18-24 months (from today's scan) is considered optional for low-risk patients, but is recommended for high-risk patients. This recommendation follows the consensus statement: Guidelines for Management of Incidental Pulmonary Nodules Detected on CT Images: From the Fleischner Society 2017; Radiology 2017; 284:228-243. 4. Mild mediastinal lymphadenopathy, nonspecific, probably reactive. 5. Mild centrilobular and paraseptal emphysema. Focal bullous emphysema in the basilar right middle lobe. 6. Aortic atherosclerosis.  Three-vessel coronary atherosclerosis. Electronically Signed   By: Ilona Sorrel M.D.   On: 08/06/2016 12:12    Microbiology: No results found for this or any previous visit (from the past 240 hour(s)).   Labs: Basic Metabolic Panel:  Recent Labs Lab 08/05/16 2009 08/06/16 0326 08/07/16 0538  NA 139 140 138  K 3.8 3.3* 4.7  CL 103 104 104  CO2 '25 28 26  '$ GLUCOSE 132* 147* 166*  BUN '7 6 14  '$ CREATININE 0.60 0.62 0.66  CALCIUM 8.4* 8.6* 9.0   Liver Function Tests: No results for input(s): AST, ALT, ALKPHOS, BILITOT, PROT, ALBUMIN in the last 168 hours. No results for input(s): LIPASE, AMYLASE in the last 168 hours. No results for input(s): AMMONIA in the last 168 hours. CBC:  Recent Labs Lab 08/05/16 2009 08/06/16 0326 08/07/16 0538  WBC 5.5 4.8 10.7*  NEUTROABS 3.9  --   --   HGB 11.6* 11.4* 10.9*  HCT 35.2* 34.8* 33.7*  MCV 93.1 93.0 92.8  PLT 178 154 169   Cardiac Enzymes: No results for input(s):  CKTOTAL, CKMB, CKMBINDEX, TROPONINI in the last 168 hours. BNP: BNP (last 3 results)  Recent Labs  08/05/16 2228  BNP 138.9*    ProBNP (last 3 results) No results for input(s): PROBNP in the last 8760 hours.  CBG:  Recent Labs Lab 08/06/16 1710  GLUCAP 243*       SignedDomenic Polite MD.  [facility] 08/10/2016, 10:00 AM

## 2016-08-10 NOTE — Care Management Note (Signed)
Case Management Note  Patient Details  Name: Dawn Gomez MRN: [id number] Date of Birth: [birth year]  Subjective/Objective:   Pt has had Home Oxygen but will need increased flow rate @ 4L and will need home Nebulizer machine. Made Jermaine aware of needs.  Pt will need to wait in room for delivery of Neb machine. RN aware.                 Action/Plan:CM will sign off for now but will be available should additional discharge needs arise or disposition change.    Expected Discharge Date:  08/10/16               Expected Discharge Plan:  Home/Self Care  In-House Referral:     Discharge planning Services  CM Consult  Post Acute Care Choice:  Resumption of Svcs/PTA Provider, Durable Medical Equipment Choice offered to:  Patient  DME Arranged:  Oxygen, Nebulizer machine DME Agency:  [place]:  NA [employer] Agency:  NA  Status of Service:  Completed, signed off  If discussed at [place] of Stay Meetings, dates discussed:    Additional Comments:  Delrae Sawyers, RN 08/10/2016, 2:52 PM

## 2016-08-11 DIAGNOSIS — J9610 Chronic respiratory failure, unspecified whether with hypoxia or hypercapnia: Secondary | ICD-10-CM | POA: Diagnosis not present

## 2016-08-11 DIAGNOSIS — J449 Chronic obstructive pulmonary disease, unspecified: Secondary | ICD-10-CM | POA: Diagnosis not present

## 2016-08-11 DIAGNOSIS — J8410 Pulmonary fibrosis, unspecified: Secondary | ICD-10-CM | POA: Diagnosis not present

## 2016-08-11 DIAGNOSIS — J9621 Acute and chronic respiratory failure with hypoxia: Secondary | ICD-10-CM | POA: Diagnosis not present

## 2016-08-11 DIAGNOSIS — G4733 Obstructive sleep apnea (adult) (pediatric): Secondary | ICD-10-CM | POA: Diagnosis not present

## 2016-08-12 ENCOUNTER — Encounter: Payer: Self-pay | Admitting: Pulmonary Disease

## 2016-08-12 ENCOUNTER — Telehealth: Payer: Self-pay | Admitting: *Deleted

## 2016-08-12 NOTE — Telephone Encounter (Signed)
Transition Care Management Follow-up Telephone Call   Date discharged? 08/10/16   How have you been since you were released from the hospital? Pt states she is doing alright  Do you understand why you were in the hospital? YES    Do you understand the discharge instructions? YES   Where were you discharged to? Home   Items Reviewed:  Medications reviewed: YES  Allergies reviewed: YES  Dietary changes reviewed: NO  Referrals reviewed: YES, will be seeing pulmonology on May 22nd   Functional Questionnaire:   Activities of Daily Living (ADLs):   She states she are independent in the following: bathing and hygiene, feeding, continence, grooming, toileting and dressing States she require assistance with the following: ambulation   Any transportation issues/concerns?: NO   Any patient concerns? NO   Confirmed importance and date/time of follow-up visits scheduled YES, pt states shehad called this am made appt 08/19/16  Provider Appointment booked with Terri Piedra, NP  Confirmed with patient if condition begins to worsen call PCP or go to the ER.  Patient was given the office number and encouraged to call back with question or concerns.  : YES

## 2016-08-13 ENCOUNTER — Inpatient Hospital Stay: Admission: RE | Admit: 2016-08-13 | Payer: Medicare Other | Source: Ambulatory Visit

## 2016-08-13 ENCOUNTER — Other Ambulatory Visit: Payer: Self-pay

## 2016-08-13 NOTE — Patient Outreach (Signed)
   Unsuccessful attempt made to contact patient via telephone for transition of care assessment. Will make another attempt within the next 21 days.

## 2016-08-14 ENCOUNTER — Other Ambulatory Visit: Payer: Self-pay

## 2016-08-15 ENCOUNTER — Encounter: Payer: Self-pay | Admitting: *Deleted

## 2016-08-15 ENCOUNTER — Other Ambulatory Visit: Payer: Self-pay | Admitting: Licensed Clinical Social Worker

## 2016-08-15 ENCOUNTER — Encounter: Payer: Self-pay | Admitting: Licensed Clinical Social Worker

## 2016-08-15 NOTE — Patient Outreach (Signed)
    This RNCM was successful in making contact with patient for this initial telephone assessment. Patient and RNCM agreed to home visit later this month to assessment of needs and establishment of community care coordination needs.  Plan: Home visit later this month

## 2016-08-15 NOTE — Patient Outreach (Signed)
[facility]) Care Management  08/15/2016  MELANIA KIRKS [birth year] [id number]  Assessment- CSW received new referral on 08/14/16 and completed initial outreach call on 08/15/16. CSW was able to reach patient successfully. HIPPA verifications were received. CSW introduced self, reason for call and of [facility] social work services. Patient is agreeable to assistance. Patient is a 81 year old female with a history of interstitial lung disease, chronic hypoxic respiratory failure, CHF, SOB, pulmonary fibrosis and pulm fibrosis flare. Patient was discharged from hospital on 08/10/16. Patient is need of housing and transportation resources. Patient reports that her son use to be able to provide stable transportation for her but he is now working and will not be able to do so as much. CSW educated patient on available transportation resources and patient is interested in [place]. Patient shares that she has mold in her house and other structural damages and that her and her son are wanting to relocate. Patient is not interested in [place] but would like to find a new residence that is in the city limits. CSW reviewed housing resources with patient. CSW wished to schedule home visit. However, patient shared that she wants some time "to recuperate from hospitalization." CSW scheduled home visit for 08/22/16.   Plan-CSW will send involvement letter to PCP. CSW will complete home visit next week.  [name], BSW, MSW, [email or web address] Phone: [phone] Fax: [phone]

## 2016-08-19 ENCOUNTER — Encounter: Payer: Self-pay | Admitting: Family

## 2016-08-19 ENCOUNTER — Ambulatory Visit (INDEPENDENT_AMBULATORY_CARE_PROVIDER_SITE_OTHER): Payer: Medicare Other | Admitting: Family

## 2016-08-19 VITALS — BP 110/70 | HR 77 | Temp 98.1°F | Resp 18 | Ht 61.0 in | Wt 171.4 lb

## 2016-08-19 DIAGNOSIS — J9621 Acute and chronic respiratory failure with hypoxia: Secondary | ICD-10-CM

## 2016-08-19 DIAGNOSIS — J8410 Pulmonary fibrosis, unspecified: Secondary | ICD-10-CM | POA: Diagnosis not present

## 2016-08-19 NOTE — Progress Notes (Signed)
Subjective:    Patient ID: Dawn Gomez, female    DOB: [birth year], 81 y.o.   MRN: [id number]  Chief Complaint  Patient presents with  . Hospitalization Follow-up    still having some weakness, gets tired really easily    HPI:  Dawn Gomez is a 81 y.o. female who  has a past medical history of Abnormal heart rhythm; Aortic aneurysm ([place]); Dyspnea; Hypothyroid; OSA (obstructive sleep apnea); Pulmonary fibrosis ([place]); SVT (supraventricular tachycardia) ([place]); and Thyroid disease. and presents today for a follow up office visit.   Recently evaluated in the emergency department and admitted to the hospital with gradually worsening shortness of breath. On physical exam she was noted to have a sickly appearance with rhonchi in the bilateral lower fields. On arrival her oxygen saturation was 87% with activity on 2 L and was increased to 90% on 3 L. Chest x-ray was consistent with interstitial lung disease. Influenza was negative. EKG without signs of acute ischemia. Diagnosed with acute on chronic respiratory failure resulting from idiopathic pulmonary fibrosis. High-resolution CT showed interstitial lung disease with peribronchial vascular and subpleural reticulation IV Solu-Medrol for 3 days was given and changed her prednisone taper and has follow-up with Dr. Lake Bells. All hospital labs, records, imaging reviewed in detail.  Since leaving the hospital she reports that she has been feeling better but continues to have the shortness of breath that improves with rest. Reports taking the medications as prescribed and able to complete her activities of daily living. Her oxygen levels are increased to 4 L via nasal cannula. Did have physical therapy set up initially, however notes that because she is functional there is no further treatment that is necessary. She does have case management established towards the end of the week. Endorses that she is trying to reduce her caloric intake to lose weight which  will help with her breathing.   Allergies  Allergen Reactions  . Lipitor [Atorvastatin] Shortness Of Breath, Diarrhea and Other (See Comments)    Dizzy, pain all over.  . Codeine   . Hydromorphone Hcl     REACTION: dementia  . Morphine   . Oxycodone-Acetaminophen   . Propoxyphene N-Acetaminophen   . Simvastatin       Outpatient Medications Prior to Visit  Medication Sig Dispense Refill  . acetaminophen (TYLENOL) 500 MG tablet Take 500 mg by mouth every 6 (six) hours as needed.    . Ascorbic Acid (VITAMIN C PO) Take by mouth.    Marland Kitchen aspirin EC 81 MG tablet Take 81 mg by mouth daily.    Marland Kitchen atorvastatin (LIPITOR) 10 MG tablet Take 10 mg by mouth daily.    Marland Kitchen CALCIUM-MAGNESIUM-ZINC PO Take 1 tablet by mouth daily.    . cholecalciferol (VITAMIN D) 1000 units tablet Take 5,000 Units by mouth daily.    Marland Kitchen diltiazem (CARDIZEM CD) 180 MG 24 hr capsule Take 120 mg by mouth daily.     Marland Kitchen diltiazem (CARDIZEM CD) 180 MG 24 hr capsule Take 180 mg by mouth daily.    Marland Kitchen escitalopram (LEXAPRO) 20 MG tablet Take 20 mg by mouth at bedtime.    . fenofibrate (TRICOR) 145 MG tablet Take 145 mg by mouth daily.    . furosemide (LASIX) 40 MG tablet Take 1 tablet (40 mg total) by mouth daily. 90 tablet 0  . ipratropium-albuterol (DUONEB) 0.5-2.5 (3) MG/3ML SOLN Take 3 mLs by nebulization every 4 (four) hours as needed. 360 mL 0  . levothyroxine (SYNTHROID, LEVOTHROID)  100 MCG tablet Take 100 mcg by mouth daily.      Marland Kitchen levothyroxine (SYNTHROID, LEVOTHROID) 50 MCG tablet Take 50 mcg by mouth daily before breakfast.    . lisinopril (PRINIVIL,ZESTRIL) 5 MG tablet Take 5 mg by mouth daily.    . potassium chloride SA (K-DUR,KLOR-CON) 20 MEQ tablet Take 20 mEq by mouth every morning.    . predniSONE (DELTASONE) 20 MG tablet Take 1-2 tablets (20-40 mg total) by mouth daily with breakfast. Take '40mg'$  for 3days then '20mg'$  for 3days then '10mg'$  for 3days then STOP 12 tablet 0  . vitamin B-12 (CYANOCOBALAMIN) 1000 MCG tablet  Take 500 mcg by mouth daily.    . vitamin C (ASCORBIC ACID) 500 MG tablet Take 500 mg by mouth daily.    . Vitamin D, Ergocalciferol, (DRISDOL) 50000 units CAPS capsule Take 1 capsule (50,000 Units total) by mouth every 7 (seven) days. 12 capsule 0  . vitamin E 400 UNIT capsule Take 200 Units by mouth daily.    Marland Kitchen aspirin 81 MG tablet Take 81 mg by mouth daily.    Marland Kitchen escitalopram (LEXAPRO) 20 MG tablet Take 20 mg by mouth daily.    . fenofibrate (TRICOR) 145 MG tablet Take 145 mg by mouth daily.    . furosemide (LASIX) 40 MG tablet Take 40 mg by mouth daily.    Marland Kitchen lisinopril (PRINIVIL,ZESTRIL) 5 MG tablet Once daily    . potassium chloride SA (K-DUR,KLOR-CON) 20 MEQ tablet Take 20 mEq by mouth daily.     Marland Kitchen VITAMIN E PO Take by mouth.     No facility-administered medications prior to visit.       Past Surgical History:  Procedure Laterality Date  . ABDOMINAL HYSTERECTOMY    . APPENDECTOMY    . CHOLECYSTECTOMY    . CRANIOTOMY     for aneurysms  . CRANIOTOMY  1980 x 2   aneurysmal clipping  . HERNIA REPAIR    . TOTAL ABDOMINAL HYSTERECTOMY    . TUBAL LIGATION        Past Medical History:  Diagnosis Date  . Abnormal heart rhythm   . Aortic aneurysm ([place])   . Dyspnea   . Hypothyroid   . OSA (obstructive sleep apnea)    on cpap  . Pulmonary fibrosis ([place])   . SVT (supraventricular tachycardia) ([place])   . Thyroid disease    hypo      Review of Systems  Constitutional: Positive for fatigue. Negative for activity change, appetite change, chills, fever and unexpected weight change.  Respiratory: Positive for shortness of breath. Negative for cough and chest tightness.   Cardiovascular: Negative for chest pain, palpitations and leg swelling.  Neurological: Positive for weakness.      Objective:    BP 110/70 (BP Location: Left Arm, Patient Position: Sitting, Cuff Size: Large)   Pulse 77   Temp 98.1 F (36.7 C) (Oral)   Resp 18   Ht '5\' 1"'$  (1.549 m)   Wt 171 lb 6.4 oz  (77.7 kg)   SpO2 98%   BMI 32.39 kg/m  Nursing note and vital signs reviewed.  Physical Exam  Constitutional: She is oriented to person, place, and time. She appears well-developed and well-nourished. No distress.  Seated in chair with O2 being delivered via nasal cannula.   Cardiovascular: Normal rate, regular rhythm, normal heart sounds and intact distal pulses.   Pulmonary/Chest: Effort normal. No accessory muscle usage. No tachypnea. No respiratory distress. She has decreased breath sounds.  Neurological:  She is alert and oriented to person, place, and time.  Skin: Skin is warm and dry.  Psychiatric: She has a normal mood and affect. Her behavior is normal. Judgment and thought content normal.       Assessment & Plan:   Problem List Items Addressed This Visit      Respiratory   Pulmonary fibrosis ([place])    Stable with current medication regimen. Follow up and changes per pulmonology.       Acute on chronic respiratory failure with hypoxia (HCC) - Primary    Appears stable with no current symptoms of exacerbation. Does remain short of breath, but appears to be oxygenating adequately. Encouraged not to attempt to lose weight while recovering. She is able to perform activities of daily living with taking occasional breaks. No adverse side effects of medication. Continue Duoneb and oxygen. Expresses difficulty with new oxygen requirement that her tank empties quickly at times. Will send message to pulmonology as she has an upcoming follow up. Continue to monitor.           I have discontinued Ms. Poblete's aspirin and VITAMIN E PO. I am also having her maintain her diltiazem, levothyroxine, atorvastatin, acetaminophen, Ascorbic Acid (VITAMIN C PO), Vitamin D (Ergocalciferol), furosemide, levothyroxine, lisinopril, fenofibrate, potassium chloride SA, aspirin EC, diltiazem, escitalopram, cholecalciferol, vitamin E, vitamin B-12, CALCIUM-MAGNESIUM-ZINC PO, vitamin C, predniSONE, and  ipratropium-albuterol.   No orders of the defined types were placed in this encounter.    Follow-up: Return in about 3 months (around 11/19/2016), or if symptoms worsen or fail to improve.  Mauricio Po, FNP

## 2016-08-19 NOTE — Patient Instructions (Addendum)
Thank you for choosing [facility].  SUMMARY AND INSTRUCTIONS:  Please continue to take your medication as prescribed.   We will follow up with Dr. Broadus John and Dr. Lake Bells.   Continue to work on mobility.  Follow up:  If your symptoms worsen or fail to improve, please contact our office for further instruction, or in case of emergency go directly to the emergency room at the closest medical facility.

## 2016-08-19 NOTE — Assessment & Plan Note (Signed)
Stable with current medication regimen. Follow up and changes per pulmonology.

## 2016-08-19 NOTE — Assessment & Plan Note (Signed)
Appears stable with no current symptoms of exacerbation. Does remain short of breath, but appears to be oxygenating adequately. Encouraged not to attempt to lose weight while recovering. She is able to perform activities of daily living with taking occasional breaks. No adverse side effects of medication. Continue Duoneb and oxygen. Expresses difficulty with new oxygen requirement that her tank empties quickly at times. Will send message to pulmonology as she has an upcoming follow up. Continue to monitor.

## 2016-08-20 ENCOUNTER — Other Ambulatory Visit: Payer: Self-pay

## 2016-08-20 NOTE — Patient Outreach (Signed)
[facility]) Care Management   08/20/2016  Dawn Gomez 05-30-1935 [id number]  Dawn Gomez is an 81 y.o. female  Subjective:  I dont have any air conditioning, I called my landlord and they have not sent anybody out yet. I have an old scale from years.  Objective:   ROS  Frail, elderly lady receiving oxygen via nasal cannula  Physical Exam ROS Encounter Medications:   Outpatient Encounter Prescriptions as of 08/20/2016  Medication Sig  . acetaminophen (TYLENOL) 500 MG tablet Take 500 mg by mouth every 6 (six) hours as needed.  . Ascorbic Acid (VITAMIN C PO) Take by mouth.  Marland Kitchen aspirin EC 81 MG tablet Take 81 mg by mouth daily.  Marland Kitchen atorvastatin (LIPITOR) 10 MG tablet Take 10 mg by mouth daily.  Marland Kitchen CALCIUM-MAGNESIUM-ZINC PO Take 1 tablet by mouth daily.  . cholecalciferol (VITAMIN D) 1000 units tablet Take 5,000 Units by mouth daily.  Marland Kitchen diltiazem (CARDIZEM CD) 180 MG 24 hr capsule Take 120 mg by mouth daily.   Marland Kitchen diltiazem (CARDIZEM CD) 180 MG 24 hr capsule Take 180 mg by mouth daily.  Marland Kitchen escitalopram (LEXAPRO) 20 MG tablet Take 20 mg by mouth at bedtime.  . fenofibrate (TRICOR) 145 MG tablet Take 145 mg by mouth daily.  . furosemide (LASIX) 40 MG tablet Take 1 tablet (40 mg total) by mouth daily.  Marland Kitchen ipratropium-albuterol (DUONEB) 0.5-2.5 (3) MG/3ML SOLN Take 3 mLs by nebulization every 4 (four) hours as needed.  Marland Kitchen levothyroxine (SYNTHROID, LEVOTHROID) 100 MCG tablet Take 100 mcg by mouth daily.    Marland Kitchen levothyroxine (SYNTHROID, LEVOTHROID) 50 MCG tablet Take 50 mcg by mouth daily before breakfast.  . lisinopril (PRINIVIL,ZESTRIL) 5 MG tablet Take 5 mg by mouth daily.  . potassium chloride SA (K-DUR,KLOR-CON) 20 MEQ tablet Take 20 mEq by mouth every morning.  . predniSONE (DELTASONE) 20 MG tablet Take 1-2 tablets (20-40 mg total) by mouth daily with breakfast. Take 89m for 3days then 271mfor 3days then 1032mor 3days then STOP  . vitamin B-12 (CYANOCOBALAMIN) 1000 MCG  tablet Take 500 mcg by mouth daily.  . vitamin C (ASCORBIC ACID) 500 MG tablet Take 500 mg by mouth daily.  . Vitamin D, Ergocalciferol, (DRISDOL) 50000 units CAPS capsule Take 1 capsule (50,000 Units total) by mouth every 7 (seven) days.  . vitamin E 400 UNIT capsule Take 200 Units by mouth daily.   No facility-administered encounter medications on file as of 08/20/2016.     Functional Status:   In your present state of health, do you have any difficulty performing the following activities: 08/20/2016 08/07/2016  Hearing? Y -[place] -  Difficulty concentrating or making decisions? N -  Walking or climbing stairs? Y -  Dressing or bathing? N -  Doing errands, shopping? Y YTempie Donningreparing Food and eating ? N -  Using the Toilet? N -  In the past six months, have you accidently leaked urine? N -  Do you have problems with loss of bowel control? N -  Managing your Medications? N -  Managing your Finances? N -  Housekeeping or managing your Housekeeping? Y -  Some recent data might be hidden    Fall/Depression Screening:    Fall Risk  08/14/2016 07/12/2016 05/22/2015  Falls in the past year? Yes No No  Number falls in past yr: 2 or more - -  Injury with Fall? No - -  Risk Factor Category  High Fall Risk - -  Risk for fall due to : History of fall(s);Impaired balance/gait;Impaired mobility;Impaired vision Impaired balance/gait -   PHQ 2/9 Scores 08/14/2016 07/12/2016 05/22/2015  PHQ - 2 Score 0 2 2  PHQ- 9 Score - 5 14   THN CM Care Plan Problem One     Most Recent Value  Care Plan Problem One  recent hospitalization for respiratory illness  Role Documenting the Problem One  Care Management Coordinator  Care Plan for Problem One  Active  THN Long Term Goal (31-90 days)  Patient will have no acute care admissions in the next days with respiratory illness  THN Long Term Goal Start Date  08/20/16  Interventions for Problem One Long Term Goal  Iniitial home visit for community care coordination  needs  THN CM Short Term Goal #1 (0-30 days)  In the next 28 days, patient will meet with [facility] RNCM for assessment of community care Coordination needs  [facility] CM Short Term Goal #1 Start Date  08/20/16  Interventions for Short Term Goal #1  [facility] RNCM met with patient to assess her community carecoordination needs.      Fall Risk  08/14/2016 07/12/2016 05/22/2015  Falls in the past year? Yes No No  Number falls in past yr: 2 or more - -  Injury with Fall? No - -  Risk Factor Category  High Fall Risk - -  Risk for fall due to : History of fall(s);Impaired balance/gait;Impaired mobility;Impaired vision Impaired balance/gait -   Assessment:   This RNCM met with patient in her home for this home visit. Patient's home air conditioner is in disrepair,  [place] assisted by making call (with patient's consent)to [employer], spoke with [place] who stated she would send repairman out to work on system.  Darrick Meigs stated she could not give a date/time for repairs because the work is farmed out. Patient's current scale was rusty and accuracy of scale's readings could not be validated so this RNCM assist patient in using her [employer] over the counter benefits  To secure a new scale.  Patient was told by the [facility] representative the scale should come in the mail in about 7-10 days.  Patient request a portable pulse oximetry, her benefits with [employer] would not cover her receiving a oximeter. Marland Kitchen RNCM will assist patient in locating an affordable oximeter through local vendor. ([facility], [facility] or [facility])  Plan:  Telephone contact in the next 21 days for assessment of progress in reaching her goals

## 2016-08-21 ENCOUNTER — Other Ambulatory Visit: Payer: Self-pay | Admitting: Licensed Clinical Social Worker

## 2016-08-21 NOTE — Patient Outreach (Signed)
[facility]) Care Management  08/21/2016  KERLY RIGSBEE 06/13/1935 [id number]  Assessment- CSW completed outreach call to patient and reminded her of scheduled home visit tomorrow for 08/22/16 in order to provide housing resources and complete SCAT application. Patient appreciative of reminder call.  Plan-CSW will complete home visit tomorrow.  [name], BSW, MSW, [email or web address] Phone: [phone] Fax: [phone]

## 2016-08-22 ENCOUNTER — Other Ambulatory Visit: Payer: Self-pay | Admitting: Licensed Clinical Social Worker

## 2016-08-22 NOTE — Patient Outreach (Signed)
[place] [facility]) Care Management  [facility] Social Work  08/22/2016  ENOLIA KOEPKE July 20, 1935 [id number]  Encounter Medications:  Outpatient Encounter Prescriptions as of 08/22/2016  Medication Sig  . acetaminophen (TYLENOL) 500 MG tablet Take 500 mg by mouth every 6 (six) hours as needed.  . Ascorbic Acid (VITAMIN C PO) Take by mouth.  Marland Kitchen aspirin EC 81 MG tablet Take 81 mg by mouth daily.  Marland Kitchen atorvastatin (LIPITOR) 10 MG tablet Take 10 mg by mouth daily.  Marland Kitchen CALCIUM-MAGNESIUM-ZINC PO Take 1 tablet by mouth daily.  . cholecalciferol (VITAMIN D) 1000 units tablet Take 5,000 Units by mouth daily.  Marland Kitchen diltiazem (CARDIZEM CD) 180 MG 24 hr capsule Take 120 mg by mouth daily.   Marland Kitchen diltiazem (CARDIZEM CD) 180 MG 24 hr capsule Take 180 mg by mouth daily.  Marland Kitchen escitalopram (LEXAPRO) 20 MG tablet Take 20 mg by mouth at bedtime.  . fenofibrate (TRICOR) 145 MG tablet Take 145 mg by mouth daily.  . furosemide (LASIX) 40 MG tablet Take 1 tablet (40 mg total) by mouth daily.  Marland Kitchen ipratropium-albuterol (DUONEB) 0.5-2.5 (3) MG/3ML SOLN Take 3 mLs by nebulization every 4 (four) hours as needed.  Marland Kitchen levothyroxine (SYNTHROID, LEVOTHROID) 100 MCG tablet Take 100 mcg by mouth daily.    Marland Kitchen levothyroxine (SYNTHROID, LEVOTHROID) 50 MCG tablet Take 50 mcg by mouth daily before breakfast.  . lisinopril (PRINIVIL,ZESTRIL) 5 MG tablet Take 5 mg by mouth daily.  . potassium chloride SA (K-DUR,KLOR-CON) 20 MEQ tablet Take 20 mEq by mouth every morning.  . predniSONE (DELTASONE) 20 MG tablet Take 1-2 tablets (20-40 mg total) by mouth daily with breakfast. Take '40mg'$  for 3days then '20mg'$  for 3days then '10mg'$  for 3days then STOP  . vitamin B-12 (CYANOCOBALAMIN) 1000 MCG tablet Take 500 mcg by mouth daily.  . vitamin C (ASCORBIC ACID) 500 MG tablet Take 500 mg by mouth daily.  . Vitamin D, Ergocalciferol, (DRISDOL) 50000 units CAPS capsule Take 1 capsule (50,000 Units total) by mouth every 7 (seven) days.  . vitamin E 400  UNIT capsule Take 200 Units by mouth daily.   No facility-administered encounter medications on file as of 08/22/2016.     Functional Status:  In your present state of health, do you have any difficulty performing the following activities: 08/20/2016 08/07/2016  Hearing? [place]? Y -  Difficulty concentrating or making decisions? N -  Walking or climbing stairs? Y -  Dressing or bathing? N -  Doing errands, shopping? Tempie Donning  Preparing Food and eating ? N -  Using the Toilet? N -  In the past six months, have you accidently leaked urine? N -  Do you have problems with loss of bowel control? N -  Managing your Medications? N -  Managing your Finances? N -  Housekeeping or managing your Housekeeping? Y -  Some recent data might be hidden    Fall/Depression Screening:  PHQ 2/9 Scores 08/22/2016 08/14/2016 07/12/2016 05/22/2015  PHQ - 2 Score 0 0 2 2  PHQ- 9 Score - - 5 14    Assessment: CSW completed initial home visit on 08/22/16 with patient. Patient is interested in gaining housing resource information and stable transportation. CSW provided handout of resources which included: transportation resources, senior resources and housing resources. Patient has mold in her home and is interested in relocating. CSW provided education on all housing resources and asked if patient wanted CSW to apply for public housing for patient. She declined stating "  I'm not sure what I want to do yet and I probably won't qualify. I want to talk to my son about it first." Patient was educated on how to apply for [employer] and CSW wrote each step out of her to keep in case she changes her mind. Patient has not working air currently but this will be getting fixed today per patient. She has several fans running which has been helpful. Patient shares that her monthly income is $1,800 per month and she has been declined both food stamps and Medicaid in the past. CSW spent time reviewing Company secretary with patient.  Patient shares that she has no issues affording food at this time. She denies experiencing any depressive symptoms at this time but admits "I am a very social person and I like people. I don't get to get out as much anymore." CSW provided education on the [facility] and patient is very interested in program. CSW provided handout with information on program for her to keep. CSW encouraged patient to try and implement more socialization into her routine. Patient reports that her main outlets are reading and going to church. She shares that she has plans to have dinner with her sister for her birthday next week. CSW provided review of appropriate coping tools as well. Patient desires to gain SCAT services. CSW completed entire application and faxed it successfully to SCAT on 5/171/18. Patient was informed to expect a phone call within three weeks to schedule SCAT assessment. Patient expressed understanding.  [facility] CM Care Plan Problem One     Most Recent Value  Care Plan Problem One  Lack of overall community resource support  Role Documenting the Problem One  Clinical Social Worker  Care Plan for Problem One  Active  THN CM Short Term Goal #1 (0-30 days)  Patient will be educated on available transportation resources within 30 days  [facility] CM Short Term Goal #1 Start Date  08/15/16  Interventions for Short Term Goal #1  CSW will complete home visit and will educate patient on transportation resources. CSW will provide handout for her and complete SCAT applicatiion or Senior Wheels referral if needed.  THN CM Short Term Goal #2 (0-30 days)  Patient will schedule SCAT assessment within 30 days  THN CM Short Term Goal #2 Start Date  08/22/16  Interventions for Short Term Goal #2  CSW has completed SCAT application, faxed to SCAT and will assist with the application process if needed. CSW has educated patient on the entire process and procedure for gaining SCAT services.      Plan: CSW will follow up  within three weeks. CSW will route encounter to PCP. CSW faxed completed SCAT application successfully.  [name], BSW, MSW, [email or web address] Phone: [phone] Fax: [phone]

## 2016-08-23 ENCOUNTER — Other Ambulatory Visit: Payer: Self-pay | Admitting: Licensed Clinical Social Worker

## 2016-08-23 NOTE — Patient Outreach (Signed)
[place] [facility]) Care Management  08/23/2016  Dawn Gomez [birth year] [id number]  Assessment- CSW received notification from Floydene Flock that SCAT application was successfully received.  Plan-CSW will follow up within three weeks.  [name], BSW, MSW, [email or web address] Phone: [phone] Fax: [phone]

## 2016-08-23 NOTE — Telephone Encounter (Signed)
This encounter was created in error - please disregard.

## 2016-08-27 ENCOUNTER — Inpatient Hospital Stay: Payer: Medicare Other | Admitting: Adult Health

## 2016-09-03 ENCOUNTER — Other Ambulatory Visit: Payer: Self-pay | Admitting: Licensed Clinical Social Worker

## 2016-09-03 NOTE — Patient Outreach (Signed)
[facility] [facility]) Care Management  09/03/2016  Dawn Gomez [birth year] [id number]  Assessment- CSW completed call to patient and patient answered. She shares that she had just got off the phone with [facility]AT and successfully set up her assessment appointment for 09/10/16 at 10 am. She also stated "A nurse from [facility] is suppose to come out this Friday and check my medicine and my vitals." Patient denies any other social work concerns at this time. CSW will continue to assist patient with gaining stable transportation.  Plan-CSW will follow up within two weeks.  [name], BSW, MSW, [email or web address] Phone: [phone] Fax: [phone]

## 2016-09-10 ENCOUNTER — Other Ambulatory Visit: Payer: Self-pay | Admitting: Licensed Clinical Social Worker

## 2016-09-10 NOTE — Patient Outreach (Signed)
[facility]) Care Management  09/10/2016  Dawn Gomez 14-Jun-1935 [id number]  Assessment- CSW received incoming call from patient. She reports that she went to SCAT intake assessment appointment today and was approved for services. She shares that she received her badge today as well and expresses understanding on how to schedule a transportation ride. Patient was very appreciative of social work support and denies having any further social work needs at this time. Patient is agreeable to social work discharge.  Plan-CSW will update THN RNCM and PCP of social work discharge.   [facility] CM Care Plan Problem One     Most Recent Value  Care Plan Problem One  Lack of overall community resource support  Role Documenting the Problem One  Clinical Social Worker  Care Plan for Problem One  Active  THN CM Short Term Goal #1   Patient will be educated on available transportation resources within 30 days  [facility] CM Short Term Goal #1 Start Date  08/15/16  [facility] CM Short Term Goal #1 Met Date  09/10/16  Interventions for Short Term Goal #1  Goal met.  THN CM Short Term Goal #2   Patient will schedule SCAT assessment within 30 days  THN CM Short Term Goal #2 Start Date  08/22/16  [facility] CM Short Term Goal #2 Met Date  09/10/16  Interventions for Short Term Goal #2  Goal met.     [name], BSW, MSW, [email or web address] Phone: [phone] Fax: [phone]

## 2016-09-11 ENCOUNTER — Inpatient Hospital Stay: Payer: Medicare Other | Admitting: Adult Health

## 2016-09-11 DIAGNOSIS — J9610 Chronic respiratory failure, unspecified whether with hypoxia or hypercapnia: Secondary | ICD-10-CM | POA: Diagnosis not present

## 2016-09-11 DIAGNOSIS — J9621 Acute and chronic respiratory failure with hypoxia: Secondary | ICD-10-CM | POA: Diagnosis not present

## 2016-09-11 DIAGNOSIS — J449 Chronic obstructive pulmonary disease, unspecified: Secondary | ICD-10-CM | POA: Diagnosis not present

## 2016-09-18 ENCOUNTER — Encounter: Payer: Self-pay | Admitting: Pulmonary Disease

## 2016-09-18 ENCOUNTER — Ambulatory Visit (INDEPENDENT_AMBULATORY_CARE_PROVIDER_SITE_OTHER): Payer: Medicare Other | Admitting: Pulmonary Disease

## 2016-09-18 VITALS — BP 114/70 | HR 94 | Ht 61.0 in | Wt 170.8 lb

## 2016-09-18 DIAGNOSIS — R059 Cough, unspecified: Secondary | ICD-10-CM

## 2016-09-18 DIAGNOSIS — J8410 Pulmonary fibrosis, unspecified: Secondary | ICD-10-CM

## 2016-09-18 DIAGNOSIS — R05 Cough: Secondary | ICD-10-CM | POA: Diagnosis not present

## 2016-09-18 DIAGNOSIS — J849 Interstitial pulmonary disease, unspecified: Secondary | ICD-10-CM | POA: Diagnosis not present

## 2016-09-18 NOTE — Progress Notes (Signed)
Dawn Gomez   Chief Complaint  Patient presents with  . Hospitalization Follow-up    Pt feels like her breathing is doing ok, pt still gets increase sob with exertion,pt is c/o some dizziness, she does have a slight non productive cough,      Primary Pulmonologist: Dr. Lake Gomez   Current Outpatient Prescriptions on File Prior to Visit  Medication Sig  . acetaminophen (TYLENOL) 500 MG tablet Take 500 mg by mouth every 6 (six) hours as needed.  . Ascorbic Acid (VITAMIN C PO) Take by mouth.  Marland Kitchen aspirin EC 81 MG tablet Take 81 mg by mouth daily.  Marland Kitchen CALCIUM-MAGNESIUM-ZINC PO Take 1 tablet by mouth daily.  . cholecalciferol (VITAMIN D) 1000 units tablet Take 5,000 Units by mouth daily.  Marland Kitchen diltiazem (CARDIZEM CD) 180 MG 24 hr capsule Take 120 mg by mouth daily.   Marland Kitchen escitalopram (LEXAPRO) 20 MG tablet Take 20 mg by mouth at bedtime.  . fenofibrate (TRICOR) 145 MG tablet Take 145 mg by mouth daily.  . furosemide (LASIX) 40 MG tablet Take 1 tablet (40 mg total) by mouth daily.  Marland Kitchen ipratropium-albuterol (DUONEB) 0.5-2.5 (3) MG/3ML SOLN Take 3 mLs by nebulization every 4 (four) hours as needed.  Marland Kitchen levothyroxine (SYNTHROID, LEVOTHROID) 100 MCG tablet Take 100 mcg by mouth daily.    Marland Kitchen lisinopril (PRINIVIL,ZESTRIL) 5 MG tablet Take 5 mg by mouth daily.  . potassium chloride SA (K-DUR,KLOR-CON) 20 MEQ tablet Take 20 mEq by mouth every morning.  . vitamin B-12 (CYANOCOBALAMIN) 1000 MCG tablet Take 500 mcg by mouth daily.  . vitamin C (ASCORBIC ACID) 500 MG tablet Take 500 mg by mouth daily.  . Vitamin D, Ergocalciferol, (DRISDOL) 50000 units CAPS capsule Take 1 capsule (50,000 Units total) by mouth every 7 (seven) days.  . vitamin E 400 UNIT capsule Take 200 Units by mouth daily.  Marland Kitchen atorvastatin (LIPITOR) 10 MG tablet Take 10 mg by mouth daily.  Marland Kitchen levothyroxine (SYNTHROID, LEVOTHROID) 50 MCG tablet Take 50 mcg by mouth daily before breakfast.   No current facility-administered medications on file  prior to visit.      Studies: - ECHO 06/2012 >> mild focal basal hypertrophy of the septum, LVEF 55-60%, normal wall motion, grade 1 diastolic dysfunction - ONO on RA with cpap 06/2012: Low sat 85%, only 7 min less than 88% entire night.  - Ambul ox 07/2013: desat to 87% transiently. Pt would like to hold off on portable oxygen  -  PFT 10/2015 >> ratio 89%, FVC 1.72 L 76% predicted, total lung capacity 3.08 L, 69% predicted, DLCO 5.89 31% predicted  Imaging: CXR 10/2010: Stable interstitial changes. CXR 2013:  Stable IS changes CT Chest 2014:  Subpleural reticulation, ?early honeycombing, >> no definite GGO, may be UIP or maybe not.  HRCT 01/2014:  Slight progression of ISLD, and pattern suggestive of UIP CT chest 03/2015 , stable fibrosis since 2014  HRCT 08/06/16 >> spectrum of findings suggestive of ILD with peribronchovascular & subpleural reticulation, mild traction bronchiectasis, mild architectural distortion and no frank honeycombing.  Favor NSIP vs UIP, small dependent bilateral pleural effusions L>R, 6 mm solid Gomez nodule, mild mediastinal LAN, mild centrilobular & paraseptal emphysema, focal bullous emphysema, three vessel coronary atherosclerosis, aortic atherosclerosis  Past Medical Hx:  has a past medical history of Abnormal heart rhythm; Aortic aneurysm (Dawn Gomez); Dyspnea; Hypothyroid; OSA (obstructive sleep apnea); Gomez fibrosis (Dawn Gomez); SVT (supraventricular tachycardia) ([place]); and Thyroid disease.   Past Surgical hx, Allergies, Family hx, Social hx all  reviewed.  Vital Signs BP 114/70 (BP Location: Left Arm, Patient Position: Sitting, Cuff Size: Normal)   Pulse 94   Ht 5\' 1"  (1.549 m)   Wt 170 lb 12.8 oz (77.5 kg)   SpO2 93%   BMI 32.27 kg/m   History of Present Illness Dawn Gomez is a 81 y.o. female, former smoker (100 pk years, quit in 2008) with a history of ILD (unclear diagnosis) who presented to the Gomez office for hospital follow up.    She was  recently admited from 4/30 - 5/5 for acute on chronic hypoxic respiratory failure. She was discharged on a prednisone taper. HRCT of the chest was evaluated and showed concerns for UIP vs NSIP.  She reports she finished her prednisone taper as prescribed.  She notes she felt some better while on prednisone.  Influenza panel was negative.  She also admitted to living in a constantly damp house with concerns for black mold / asbestos contamination.   As of late, she states she has been getting very short of breath with exertion and has limited time on her portable oxygen tanks (about 2 hours of activity away from home).  She states walking in from the parking lot she had to rest on the bench outside as she was SOB and felt chest tightness.  When at home, she is on 4L continuous and feels fine with exertion.    She reports she intermittently has a dry hacking cough.  She notes it is worse when she is not on lasix.  Of note, she is on an ACE-I for HTN.   Denies fevers, chills, nausea/vomiting, GERD.  Physical Exam  General - well developed adult F in no acute distress ENT - No sinus tenderness, no oral exudate, no LAN Cardiac - s1s2 regular, no murmur Chest - even/non-labored, lungs bilaterally clear. No wheeze/crackles Back - No focal tenderness Abd - Soft, non-tender Ext - minimal trace ankle edema Neuro - Normal strength Skin - No rashes Psych - normal mood, and behavior   Assessment/Plan  Discussion:  81 y/o F with PMH of former tobacco abuse and ILD.  Recent admit for worsening of hypoxic respiratory failure thought related to IPF flare.  Feeling better post completion of steroids / discharge.  However, notes she is not tolerating her pulse O2 as well as she used to with increased SOB and chest tightness.    ILD - not defined, concern for NSIP vs UIP on steroids.  She feels she was some better when on prednisone while inpatient.    Chronic Hypoxic Respiratory Failure - in setting of  ILD  LUL Lung Nodule - 37mm on HRCT 5/1  Plan: Continue O2 a 4L We will work toward a continuous / portable option for 4L delivery Follow up Gomez nodule with CT in one year  ? If she would be a candidate for pirfenidone > defer to primary pulmonary MD for CT review as it has been felt in the past she was not a candidate Follow up with Dr. Lake Gomez as scheduled      Patient Instructions  1.  So glad you are feeling better!  2.  Wear your oxygen at 4L all the time.  We will arrange for you to get a portable concentrator for continuous O2  3.  Follow up with Dr. Lake Gomez as previously scheduled  4.  Call if new or worsening symptoms or report to the ER if concerns   Noe Gens, NP-C [place] [facility]  Critical Care Office  [phone] 09/18/2016, 2:26 PM

## 2016-09-18 NOTE — Patient Instructions (Signed)
1.  So glad you are feeling better!  2.  Wear your oxygen at 4L all the time.  We will arrange for you to get a portable concentrator for continuous O2  3.  Follow up with Dr. Lake Bells as previously scheduled  4.  Call if new or worsening symptoms or report to the ER if concerns

## 2016-09-19 NOTE — Progress Notes (Signed)
Reviewed, agree.  Doubt IPF based on imaging which doesn't show UIP. So, I don't think she should receive an anti-fibrotic medicine right now.

## 2016-09-20 ENCOUNTER — Telehealth: Payer: Self-pay | Admitting: Pulmonary Disease

## 2016-09-20 DIAGNOSIS — J9621 Acute and chronic respiratory failure with hypoxia: Secondary | ICD-10-CM

## 2016-09-20 DIAGNOSIS — J8410 Pulmonary fibrosis, unspecified: Secondary | ICD-10-CM

## 2016-09-20 NOTE — Telephone Encounter (Signed)
Can we find another DME that has what she needs?

## 2016-09-20 NOTE — Telephone Encounter (Signed)
I sent a message to [email or web address]  to see what the problem is Dawn Gomez

## 2016-09-20 NOTE — Telephone Encounter (Signed)
Per Corene Cornea from [facility], [place], Gay Filler E        We do not carry a POC that will work for a pt that has a liter flow of 4 lpm continuous. Our POC will only do 2 lpm continuous

## 2016-09-20 NOTE — Telephone Encounter (Signed)
Spoke with the pt  She states that [facility] told her that they do not have the equipment that we ordered and needs a new DME  PCC's please help with this and find DME that has what we ordered thanks

## 2016-09-23 NOTE — Telephone Encounter (Signed)
New order placed. Nothing further needed. 

## 2016-09-23 NOTE — Telephone Encounter (Signed)
Needs tanks then for 4L continuous

## 2016-09-23 NOTE — Telephone Encounter (Signed)
I thought this pt had already been told we do not know of any dme that has a poc that will deliver [place]

## 2016-09-23 NOTE — Telephone Encounter (Signed)
BQ  Please Advise-  This pt saw Brandi on 09/18/16 and order was placed to arrange for pt to received a POC on 4L continuous. Order was sent to pt's DME but they do not carry a POC that goes that high and per PCCs they do not know of any other company. Pt is aware of this but she is concerned because she needs continuous because she is not tolerating being on pulse

## 2016-09-23 NOTE — Telephone Encounter (Signed)
[facility] can you help with this matter?

## 2016-10-03 ENCOUNTER — Telehealth: Payer: Self-pay | Admitting: Family

## 2016-10-03 MED ORDER — ESCITALOPRAM OXALATE 20 MG PO TABS: 20.0000 mg | ORAL_TABLET | Freq: Every day | ORAL | 0 refills | Status: DC

## 2016-10-03 NOTE — Telephone Encounter (Signed)
Pharmacy called stating that the pt is needing a refill on her escitalopram (LEXAPRO) 20 MG tablet. He said that she transferred her care to [facility] and he is taking over her medications. Can this be sent over for her to [facility]?

## 2016-10-03 NOTE — Telephone Encounter (Signed)
Medication has been sent to [facility].

## 2016-10-11 DIAGNOSIS — J9621 Acute and chronic respiratory failure with hypoxia: Secondary | ICD-10-CM | POA: Diagnosis not present

## 2016-10-11 DIAGNOSIS — J9610 Chronic respiratory failure, unspecified whether with hypoxia or hypercapnia: Secondary | ICD-10-CM | POA: Diagnosis not present

## 2016-10-11 DIAGNOSIS — J449 Chronic obstructive pulmonary disease, unspecified: Secondary | ICD-10-CM | POA: Diagnosis not present

## 2016-10-29 ENCOUNTER — Ambulatory Visit (INDEPENDENT_AMBULATORY_CARE_PROVIDER_SITE_OTHER): Payer: Medicare Other | Admitting: Pulmonary Disease

## 2016-10-29 ENCOUNTER — Encounter: Payer: Self-pay | Admitting: Pulmonary Disease

## 2016-10-29 VITALS — BP 114/62 | HR 78 | Ht 60.0 in | Wt 173.0 lb

## 2016-10-29 DIAGNOSIS — J849 Interstitial pulmonary disease, unspecified: Secondary | ICD-10-CM | POA: Diagnosis not present

## 2016-10-29 DIAGNOSIS — J432 Centrilobular emphysema: Secondary | ICD-10-CM | POA: Diagnosis not present

## 2016-10-29 DIAGNOSIS — J9611 Chronic respiratory failure with hypoxia: Secondary | ICD-10-CM | POA: Diagnosis not present

## 2016-10-29 LAB — PULMONARY FUNCTION TEST
DL/VA % PRED: 45 %
DL/VA: 1.93 ml/min/mmHg/L
DLCO UNC: 5.18 ml/min/mmHg
DLCO cor % pred: 27 %
DLCO cor: 5.19 ml/min/mmHg
DLCO unc % pred: 27 %
FEF 25-75 Post: 2.13 L/sec
FEF 25-75 Pre: 2.05 L/sec
FEF2575-%Change-Post: 3 %
FEF2575-%PRED-POST: 169 %
FEF2575-%Pred-Pre: 162 %
FEV1-%CHANGE-POST: 0 %
FEV1-%PRED-POST: 95 %
FEV1-%Pred-Pre: 95 %
FEV1-PRE: 1.55 L
FEV1-Post: 1.54 L
FEV1FVC-%Change-Post: 7 %
FEV1FVC-%Pred-Pre: 114 %
FEV6-%Change-Post: -7 %
FEV6-%PRED-POST: 81 %
FEV6-%PRED-PRE: 88 %
FEV6-POST: 1.69 L
FEV6-PRE: 1.83 L
FEV6FVC-%CHANGE-POST: 0 %
FEV6FVC-%PRED-POST: 106 %
FEV6FVC-%PRED-PRE: 106 %
FVC-%CHANGE-POST: -7 %
FVC-%Pred-Post: 76 %
FVC-%Pred-Pre: 83 %
FVC-Post: 1.69 L
FVC-Pre: 1.84 L
POST FEV1/FVC RATIO: 91 %
POST FEV6/FVC RATIO: 100 %
PRE FEV6/FVC RATIO: 100 %
Pre FEV1/FVC ratio: 84 %
RV % PRED: 54 %
RV: 1.18 L
TLC % pred: 66 %
TLC: 2.98 L

## 2016-10-29 MED ORDER — UMECLIDINIUM-VILANTEROL 62.5-25 MCG/INH IN AEPB: 1.0000 | INHALATION_SPRAY | Freq: Every day | RESPIRATORY_TRACT | 0 refills | Status: DC

## 2016-10-29 NOTE — Progress Notes (Signed)
Subjective:    Patient ID: Dawn Gomez, female    DOB: Apr 16, 1935, 81 y.o.   MRN: [id number] Synopsis:  Former patient of Dr. Gwenette Greet has pulmonary fibrosis of uncertain etiology.  HPI Chief Complaint  Patient presents with  . Follow-up    review PFT.  pt is concerned about her ambulatory O2- states it does not go up to 4lpm as needed.     [place] says that she does well when she is on continuous oxygen she does well.  However when she uses the tanks with a pulse regulator on it she feels dizzy and weak.  She has had some pre-syncope type episodes associated with use of her pulse oxygen device (with tanks).  Whenever she uses the pulse device she also feels very short of breath.  She says that the continuous oxygen works well for her, but the pulse never works.  If she is on 4L continuous flow she does well.  If she is working or exercising (even showering) will make her very tired and dyspnic.    She is not coughing more, no more new chest congestion.  She has some chronic sinus symptoms which are worse.    She has not taken any inhaled medicines in the past.    Past Medical History:  Diagnosis Date  . Abnormal heart rhythm   . Aortic aneurysm ([place])   . Dyspnea   . Hypothyroid   . OSA (obstructive sleep apnea)    on cpap  . Pulmonary fibrosis ([place])   . SVT (supraventricular tachycardia) ([place])   . Thyroid disease    hypo     Review of Systems  Constitutional: Positive for fatigue. Negative for chills and fever.  HENT: Negative for postnasal drip, rhinorrhea and sinus pressure.   Respiratory: Positive for shortness of breath. Negative for cough and wheezing.   Cardiovascular: Negative for chest pain, palpitations and leg swelling.       Objective:   Physical Exam Vitals:   10/29/16 1410  BP: 114/62  Pulse: 78  Weight: 173 lb (78.5 kg)  Height: 5' (1.524 m)   2L [place]  Gen: chronically ill appearing HENT: OP clear, TM's clear, neck supple PULM: Crackles bases B,  normal percussion CV: RRR, no mgr, trace edema GI: BS+, soft, nontender Derm: no cyanosis or rash Psyche: normal mood and affect    LABS  2009:  ANA, RF, ACE, ESR, all ok.  Autoimmune 01/2014:  Negative 04/2015 ANA, RF, CCP, JO-1, SSA, SSB, SCL-70, Anti-centromere all negative  PFT's  10/2010:  No obstruction, TLC 3.05 (73% pred), DLCO 6.1 (31%) PFT's 2013:  No obstruction, TLC 3.05 (73%), DLCO 6.3 (31%) PFT's 03/2013:  FEV1 1.52 (87%), ratio 88, FVC 1.73 (74%), TLC 2.80 (62%), DLCO 7.22 (38%) PFT's 01/2014:  FEV1 1.54 (90%), ratio 87, FVC 1.78 (76%), TLC 2.90 (65%), DLCO 6.22 (32%) 04/2015 PFT> FVC 1.67 L (73% predicted), total lung capacity 2.76 L (62% predicted), DLCO 6.50 (34% predicted). July 2017 pulmonary function testing ratio 89%, FVC 1.72 L 76% predicted, total lung capacity 3.08 L, 69% predicted, DLCO 5.89 31% predicted July 2018 pulmonary function tests: Ratio 91%, FVC 1.69 L, 76% predicted (pre FVC 1.84L) , total lung capacity 2.98 L 66% predicted, DLCO 5.18 27 percent protected  Imaging: CXR 10/2010:  Stable interstitial changes. CXR 2013:  Stable IS changes CT chest 2014:  Subpleural reticulation, ?early honeycombing, per chest radiology >> no definite GGO, may be UIP or maybe not.  HRCT 01/2014:  Slight progression of ISLD, and pattern suggestive of UIP 03/2015 CT chest , stable fibrosis since 2014  2018 high-resolution CT chest images independently reviewed showing upper lobe predominant centrilobular emphysema with nonspecific patchy fibrotic changes in the bases somewhat in a bronchovascular distribution,    Other: ONO on RA with cpap 06/2012:  Low sat 85%, only 7 min less than 88% entire night.  Ambul ox 07/2013:  desat to 87% transiently.  Pt would like to hold off on portable oxygen   Echo: TTE 06/2012:  Normal LV, DD, normal RV, no evidence for pulmonary htn.        Assessment & Plan:  Chronic respiratory failure with hypoxia (Dawn Gomez) - Plan: Ambulatory  Referral for DME  Interstitial lung disease ([place])  Centrilobular emphysema ([place])   Discussion:  Dawn Gomez has an ill-defined interstitial lung disease which based on lung function testing this year has shown little evidence of progression. I've looked at the images from the CT scan of her chest which showed fibrotic changes but were not consistent with usual interstitial pneumonitis. So she may have some form of nonspecific interstitial pneumonitis regardless the cause at this time I see no indication for anti-fibrotic therapy.  Notable on her CT scan of the chest with centrilobular emphysema. Because of this I like to start her on bronchodilators to see if it helps improve her symptoms.  She's been having a lot of trouble with her supplemental oxygen. She really needs to use supplemental oxygen at 4 L/m continuous not pulse flow. We are going to write a prescription for this there is advanced home care. I'm also going to prescribe an oxygen conserving device.  Plan:  For your Interstitial lung disease: There is no indication for medication right now  For your centrilobular emphysema: Take the Anoro one puff daily no matter how you feel, if it helps call me and let me know Use albuterol as needed for shortness of breath Flu shot in the fall  For your chronic respiratory failure with hypoxemia: When you are out of the house you need to have tanks that will facilitate 4 L/m of continuous flow oxygen, we will write an order for this with advanced home care Continue using her oxygen concentrator at home We will prescribe a Oxymizer device to help conserve the amount of oxygen use need  We will see you back in 3 months or sooner  Current Outpatient Prescriptions:  .  acetaminophen (TYLENOL) 500 MG tablet, Take 500 mg by mouth every 6 (six) hours as needed., Disp: , Rfl:  .  Ascorbic Acid (VITAMIN C PO), Take by mouth., Disp: , Rfl:  .  aspirin EC 81 MG tablet, Take 81 mg by mouth daily.,  Disp: , Rfl:  .  atorvastatin (LIPITOR) 10 MG tablet, Take 10 mg by mouth daily., Disp: , Rfl:  .  CALCIUM-MAGNESIUM-ZINC PO, Take 1 tablet by mouth daily., Disp: , Rfl:  .  cholecalciferol (VITAMIN D) 1000 units tablet, Take 5,000 Units by mouth daily., Disp: , Rfl:  .  diltiazem (CARDIZEM CD) 180 MG 24 hr capsule, Take 120 mg by mouth daily. , Disp: , Rfl:  .  escitalopram (LEXAPRO) 20 MG tablet, Take 1 tablet (20 mg total) by mouth at bedtime., Disp: 90 tablet, Rfl: 0 .  fenofibrate (TRICOR) 145 MG tablet, Take 145 mg by mouth daily., Disp: , Rfl:  .  furosemide (LASIX) 40 MG tablet, Take 1 tablet (40 mg total) by mouth daily., Disp: 90 tablet, Rfl:  0 .  ipratropium-albuterol (DUONEB) 0.5-2.5 (3) MG/3ML SOLN, Take 3 mLs by nebulization every 4 (four) hours as needed., Disp: 360 mL, Rfl: 0 .  levothyroxine (SYNTHROID, LEVOTHROID) 100 MCG tablet, Take 100 mcg by mouth daily.  , Disp: , Rfl:  .  levothyroxine (SYNTHROID, LEVOTHROID) 50 MCG tablet, Take 50 mcg by mouth daily before breakfast., Disp: , Rfl:  .  lisinopril (PRINIVIL,ZESTRIL) 5 MG tablet, Take 5 mg by mouth daily., Disp: , Rfl:  .  potassium chloride SA (K-DUR,KLOR-CON) 20 MEQ tablet, Take 20 mEq by mouth every morning., Disp: , Rfl:  .  vitamin B-12 (CYANOCOBALAMIN) 1000 MCG tablet, Take 500 mcg by mouth daily., Disp: , Rfl:  .  vitamin C (ASCORBIC ACID) 500 MG tablet, Take 500 mg by mouth daily., Disp: , Rfl:  .  Vitamin D, Ergocalciferol, (DRISDOL) 50000 units CAPS capsule, Take 1 capsule (50,000 Units total) by mouth every 7 (seven) days., Disp: 12 capsule, Rfl: 0 .  vitamin E 400 UNIT capsule, Take 200 Units by mouth daily., Disp: , Rfl:

## 2016-10-29 NOTE — Progress Notes (Signed)
PFT done today. 

## 2016-10-29 NOTE — Patient Instructions (Signed)
For your Interstitial lung disease: There is no indication for medication right now  For your centrilobular emphysema: Take the Anoro one puff daily no matter how you feel, if it helps call me and let me know Use albuterol as needed for shortness of breath Flu shot in the fall  For your chronic respiratory failure with hypoxemia: When you are out of the house you need to have tanks that will facilitate 4 L/m of continuous flow oxygen, we will write an order for this with advanced home care Continue using her oxygen concentrator at home We will prescribe a Oxymizer device to help conserve the amount of oxygen use need  We will see you back in 3 months or sooner

## 2016-11-06 ENCOUNTER — Telehealth: Payer: Self-pay | Admitting: Pulmonary Disease

## 2016-11-06 NOTE — Telephone Encounter (Signed)
I called [facility] & spoke to [place].  He states pt had appt on 7/27 to see resp therapist & appt was canceled.  He could see where pt called this morning to ask about larger tanks but could not see any documentation about call.  He states pt has 3 D tanks & they do have bigger E tanks if she is interested - pt should meet with RT to see what can be done to extend tank life such as maybe pulsated flow.  I told him I would call pt.  Called pt.  She stated she did not have appt & was not aware of it being canceled.  She states she talked to RT.  I explained bigger E tank &she is interested.  I told her I would have someone from [facility] to call her & straighten out confusion and for her to call us if she needs [place] to do anything else for her.  I called Corene Cornea & left him vm & asked him to have RT to call her back to see what can be done for pt.  Nothing further needed at this time.

## 2016-11-06 NOTE — Telephone Encounter (Signed)
Spoke with patient. She stated that she went to over to [facility] yesterday to see about getting a bigger oxygen tank and she was advised to contact our office to find another DME. [facility] stated that they do not have a bigger oxygen tank for her needs. She currently has a 10lb tank but it will only last 1hr and 86mins while shes on 4lpm continuous.     PCCs, do you all know if any DMEs that may be able to help this patient?

## 2016-11-11 DIAGNOSIS — J9621 Acute and chronic respiratory failure with hypoxia: Secondary | ICD-10-CM | POA: Diagnosis not present

## 2016-11-11 DIAGNOSIS — J9610 Chronic respiratory failure, unspecified whether with hypoxia or hypercapnia: Secondary | ICD-10-CM | POA: Diagnosis not present

## 2016-11-11 DIAGNOSIS — J449 Chronic obstructive pulmonary disease, unspecified: Secondary | ICD-10-CM | POA: Diagnosis not present

## 2016-11-15 ENCOUNTER — Telehealth: Payer: Self-pay | Admitting: Pulmonary Disease

## 2016-11-15 NOTE — Telephone Encounter (Signed)
Called and spoke to pt. Pt states she has stopped taking the Anoro because she has started to have leg and foot cramps. Pt states her breathing and cough are unchanged since last OV. Pt denies any new s/s aside from the cramps. Pt is now requesting if BQ would like to change to another med regime. Pt is aware that BQ is out of the office until 8/27 and is requesting to wait till he returns for recs. Advised pt to contact our office back if she were to develop any new s/s. Pt verbalized understanding.   Dr. Lake Bells please advise. Thanks.

## 2016-11-18 NOTE — Telephone Encounter (Signed)
BQ please advise.

## 2016-11-21 ENCOUNTER — Other Ambulatory Visit: Payer: Self-pay | Admitting: Family

## 2016-11-21 ENCOUNTER — Other Ambulatory Visit: Payer: Self-pay

## 2016-11-21 NOTE — Patient Outreach (Signed)
   Telephone contact with patient to scheduled home visit.  Plan: Home visit with patient in the next 28 days

## 2016-11-25 ENCOUNTER — Telehealth: Payer: Self-pay | Admitting: Family

## 2016-11-25 ENCOUNTER — Other Ambulatory Visit: Payer: Self-pay | Admitting: Family

## 2016-11-25 MED ORDER — VITAMIN D (ERGOCALCIFEROL) 1.25 MG (50000 UNIT) PO CAPS: 50000.0000 [IU] | ORAL_CAPSULE | ORAL | 0 refills | Status: DC

## 2016-11-25 MED ORDER — ATORVASTATIN CALCIUM 10 MG PO TABS
10.0000 mg | ORAL_TABLET | Freq: Every day | ORAL | 2 refills | Status: DC
Start: 2016-11-25 — End: 2017-01-16

## 2016-11-25 NOTE — Addendum Note (Signed)
Addended by: Delice Bison E on: 11/25/2016 03:54 PM   Modules accepted: Orders

## 2016-11-25 NOTE — Telephone Encounter (Signed)
Confirmed with patient that it is ok to take atorvastatin off allergy list. Sent in rx to pharmacy requested.

## 2016-11-25 NOTE — Telephone Encounter (Signed)
Pt called asking for a prescription on Atorvastatin 10mg . She said that she was previously on 20mg  but thought it was causing her to have other issues. The pt said that it turns out it was not coming from the medication and she would like to start back at 10mg . The pt uses [facility].

## 2016-11-25 NOTE — Telephone Encounter (Signed)
I want to confirm that patient is okay taking atorvastatin off of her allergy list before sending in the prescription. If so, we can send in atorvastatin 10 mg 1 tablet by mouth daily #30 2 refills.

## 2016-11-27 NOTE — Telephone Encounter (Signed)
BQ will be back in the office next week.  He wil answer at that time.

## 2016-11-28 ENCOUNTER — Other Ambulatory Visit: Payer: Self-pay

## 2016-11-28 ENCOUNTER — Other Ambulatory Visit: Payer: Self-pay | Admitting: Family

## 2016-11-28 NOTE — Patient Outreach (Signed)
[place] [facility]) Care Management  11/28/2016  Dawn Gomez 1935/04/28 [id number]     Arrived at patient's home for this home visit to be advised by patient she had to go out and would not be available for the rest of the afternoon. This RNCM requested that patient call to reschedule home visit  Plan: Patient to call RNCM to reschedule home visit missed today

## 2016-12-02 NOTE — Telephone Encounter (Signed)
Hold anoro, we'll hold off on starting anything new until the next visit given the side effect

## 2016-12-02 NOTE — Telephone Encounter (Signed)
Pt aware of rec's per BQ. Nothing further needed.

## 2016-12-05 ENCOUNTER — Other Ambulatory Visit: Payer: Self-pay

## 2016-12-10 ENCOUNTER — Ambulatory Visit: Payer: Self-pay

## 2016-12-11 NOTE — Patient Outreach (Signed)
Telephone assessment attempted unsuccessfully, however, I was able to leave a message and requested a return call.  Dawn Gomez. Dawn Neither, MSN, [facility] Gerontological Nurse Practitioner [facility] Care Management [phone]

## 2016-12-12 ENCOUNTER — Other Ambulatory Visit: Payer: Self-pay | Admitting: *Deleted

## 2016-12-12 DIAGNOSIS — J449 Chronic obstructive pulmonary disease, unspecified: Secondary | ICD-10-CM | POA: Diagnosis not present

## 2016-12-12 DIAGNOSIS — J9610 Chronic respiratory failure, unspecified whether with hypoxia or hypercapnia: Secondary | ICD-10-CM | POA: Diagnosis not present

## 2016-12-12 DIAGNOSIS — J9621 Acute and chronic respiratory failure with hypoxia: Secondary | ICD-10-CM | POA: Diagnosis not present

## 2016-12-12 NOTE — Patient Outreach (Signed)
THN Follow up phone call. Pt reports she did get her air conditioner fixed. She reports she still does not have portable O2 that is adequate for her to go to church. She has tried to get this addressed before but her complaints were not addressed to her satisfaction. I advised her that I would call my contact, Dahlia Client, at [facility] to see if she could help her get the equipment she needs. I advised her that I will call her back next Tuesday, Sept 11th.  I called Dahlia Client and left a detailed message with pt information and requested her assistance.  Eulah Pont. Myrtie Neither, MSN, [facility] Gerontological Nurse Practitioner [facility] Care Management [phone]

## 2016-12-17 ENCOUNTER — Other Ambulatory Visit: Payer: Self-pay | Admitting: Licensed Clinical Social Worker

## 2016-12-17 ENCOUNTER — Other Ambulatory Visit: Payer: Self-pay | Admitting: *Deleted

## 2016-12-17 NOTE — Patient Outreach (Signed)
Care Coordination. I was able to talk with [place] at [facility], Respiratory Therapy. I explained the problem that Dawn Gomez is having with her portable O2 and [place] was very helpful and suggested the pt call and make an appointment to come in and be evaluated for a conserving device that may help her with the portable O2.  I called the pt and advised her of this and she was very appreciative and will call to schedule this evaluation. She also told me she needs a glucometer. She said she has had some hypoglycemia due to lack of food. She is not on any diabetic medications.  We discussed appropriate snacks or treatments for hypoglycemia. I advised her to call her provider to see if she can get a meter from their office or order her one. I also suggested that I talk with Jettie Booze, LCSW, about food resources that may be helpful to them.  I will talk to her again in 2 weeks to follow up.  Dawn Gomez. Dawn Neither, MSN, [facility] Gerontological Nurse Practitioner [facility] Care Management [phone]

## 2016-12-17 NOTE — Addendum Note (Signed)
Addended by: Deloria Lair on: 12/17/2016 11:41 AM   Modules accepted: Orders

## 2016-12-17 NOTE — Patient Outreach (Signed)
Dawn Gomez) Care Management  12/17/2016  Dawn Gomez January 03, 1936 [id number]  Assessment-CSW completed outreach attempt today after receiving new referral that patient is needing food assistance resource information. CSW unable to reach patient successfully. CSW left a HIPPA compliant voice message encouraging patient to return call once available.  Plan-CSW will await return call or complete an additional outreach if needed.  Dawn Gomez, Dawn Gomez, Dawn Gomez, [email or web address] Phone: [phone] Fax: [phone]

## 2016-12-18 ENCOUNTER — Other Ambulatory Visit: Payer: Medicare Other | Admitting: Licensed Clinical Social Worker

## 2016-12-18 ENCOUNTER — Other Ambulatory Visit: Payer: Self-pay | Admitting: Licensed Clinical Social Worker

## 2016-12-18 NOTE — Patient Outreach (Signed)
[place] [facility]) Care Management  12/18/2016  Dawn Gomez 1935-05-15 [id number]  Assessment-CSW completed second outreach attempt today. CSW unable to reach patient successfully. CSW left another HIPPA compliant voice message encouraging patient to return call once available in order to review food assistance resources.   Plan-CSW will await return call or complete an additional outreach if needed.  [name], BSW, MSW, [email or web address] Phone: [phone] Fax: [phone]

## 2016-12-18 NOTE — Patient Outreach (Addendum)
[place] [facility]) Care Management  12/18/2016  Dawn Gomez 01-25-1936 [id number]  Assessment- CSW received incoming call from patient. CSW received HIPPA verifications. Patient is experiencing some financial difficulties and is needing food assistance and financial resource information. CSW has worked with patient in the past and is familiar with patient. CSW assisted patient in the past with gaining stable transportation through [place]. Patient has been declined in the past for both Medicaid and food stamps and is interested in Psychologist, occupational education for programs that she can qualify for. Patient's monthly income is $1,800. CSW has encouraged patient in the past to consider Consumer Credit Counseling in order to gain budget counseling, credit counseling, housing counseling, debt management programs and financial education. Patient reports that she will consider going to program to gain assistance with budgeting her monthly income. Patient shares that she is in need of food assistance resources. CSW completed review of food assistance resources within [facility] and patient has stable transportation through [employer] to gain resources if needed. Patient appreciative of information and is agreeable to CSW mailing her out financial resources, information on Consumer Credit Counseling and food assistance resources for her to have on hand. CSW also provided emotional support over phone to help support patient. Patient was very receptive to this. CSW encouraged patient to contact CSW in the future if she has any other social work needs. Patient expressed understanding.   CSW will mail out requested community resource information to patient.  Plan-CSW will update [facility] RNCM that she will not open case at this time.   [name], BSW, MSW, [name] Phone: [phone] Fax: [phone]

## 2016-12-18 NOTE — Patient Outreach (Signed)
Request received from Brooke Joyce, LCSW to mail patient personal care resources.  Information mailed today. 

## 2016-12-31 ENCOUNTER — Ambulatory Visit: Payer: Medicare Other | Admitting: Family

## 2017-01-01 ENCOUNTER — Other Ambulatory Visit: Payer: Self-pay | Admitting: *Deleted

## 2017-01-01 ENCOUNTER — Encounter: Payer: Self-pay | Admitting: *Deleted

## 2017-01-01 NOTE — Patient Outreach (Signed)
Telephone call to follow up on pt oxygen therapy needs. Pt reports that [facility] was not able to provide her with a portable oxygen device that would last her longer that her current set up, which only lasts 1 hour, 15 minutes. This really limits her ability to participate in any community activities.  She has requested a pulse oxygen monitor and I have told her she can get one relatively inexpensively. She said she would call her [facility] nurse and ask if they would provide one for her.  I have encouraged her to call if she has future needs. I will close her case today.  Eulah Pont. Myrtie Neither, MSN, [facility] Gerontological Nurse Practitioner [facility] Care Management [phone]

## 2017-01-11 DIAGNOSIS — J449 Chronic obstructive pulmonary disease, unspecified: Secondary | ICD-10-CM | POA: Diagnosis not present

## 2017-01-11 DIAGNOSIS — J9610 Chronic respiratory failure, unspecified whether with hypoxia or hypercapnia: Secondary | ICD-10-CM | POA: Diagnosis not present

## 2017-01-11 DIAGNOSIS — J9621 Acute and chronic respiratory failure with hypoxia: Secondary | ICD-10-CM | POA: Diagnosis not present

## 2017-01-13 ENCOUNTER — Ambulatory Visit: Payer: Medicare Other | Admitting: Family

## 2017-01-13 ENCOUNTER — Other Ambulatory Visit: Payer: Self-pay | Admitting: Family

## 2017-01-16 ENCOUNTER — Ambulatory Visit: Payer: Medicare Other | Admitting: Family

## 2017-01-16 ENCOUNTER — Other Ambulatory Visit: Payer: Self-pay | Admitting: Family

## 2017-01-20 ENCOUNTER — Other Ambulatory Visit: Payer: Self-pay | Admitting: Family

## 2017-01-20 ENCOUNTER — Telehealth: Payer: Self-pay | Admitting: Family

## 2017-01-20 MED ORDER — DILTIAZEM HCL ER COATED BEADS 180 MG PO CP24: 120.0000 mg | ORAL_CAPSULE | Freq: Every day | ORAL | 1 refills | Status: DC

## 2017-01-20 MED ORDER — LISINOPRIL 5 MG PO TABS: 5.0000 mg | ORAL_TABLET | Freq: Every day | ORAL | 1 refills | Status: DC

## 2017-01-20 NOTE — Telephone Encounter (Signed)
Pt called needing a refill on diltiazem (CARDIZEM CD) 180 MG 24 hr capsule and lisinopril (PRINIVIL,ZESTRIL) 5 MG tablet sent to [facility]. She said that she is completely out. She was a previous pt of Greg's but made an appointment to see Dr Jenny Reichmann on Thursday to transfer care. She said that Marya Amsler told her that he would take over these medications but she has not needed them until now. Can these be refilled for her? She said that she does not want to go without these medication. Please advise.

## 2017-01-20 NOTE — Telephone Encounter (Signed)
meds refilled erx

## 2017-01-22 ENCOUNTER — Telehealth: Payer: Self-pay | Admitting: Pulmonary Disease

## 2017-01-22 NOTE — Telephone Encounter (Signed)
Pt had dropped off forms for both [employer] and [employer] regarding her O2.  No return information was included with [facility] form.   I called pt to make aware that forms were filled out and that no contact info was given on where to send [place] forms.  Pt will find this info and call back to relay [employer]'s contact info.

## 2017-01-22 NOTE — Telephone Encounter (Signed)
Forms located in BQ's lookat [employer] forms faxed back to [place] form placed in the mail to the address provided by pt below  Called spoke with patient to inform her of the above The original [place] form and copy of the [place] form mailed to patient for her records  Copies of all sent for scan Nothing further needed; will sign off

## 2017-01-22 NOTE — Telephone Encounter (Signed)
Send forms to:   [employer] [address] [place] [address]

## 2017-01-23 ENCOUNTER — Ambulatory Visit: Payer: Medicare Other | Admitting: Internal Medicine

## 2017-02-05 ENCOUNTER — Ambulatory Visit (INDEPENDENT_AMBULATORY_CARE_PROVIDER_SITE_OTHER)
Admission: RE | Admit: 2017-02-05 | Discharge: 2017-02-05 | Disposition: A | Discharge status: Home / Self Care | Payer: Medicare Other | Source: Ambulatory Visit | Attending: Internal Medicine | Admitting: Internal Medicine

## 2017-02-05 ENCOUNTER — Encounter: Payer: Self-pay | Admitting: Internal Medicine

## 2017-02-05 ENCOUNTER — Other Ambulatory Visit (INDEPENDENT_AMBULATORY_CARE_PROVIDER_SITE_OTHER): Payer: Medicare Other

## 2017-02-05 ENCOUNTER — Ambulatory Visit (INDEPENDENT_AMBULATORY_CARE_PROVIDER_SITE_OTHER): Payer: Medicare Other | Admitting: Internal Medicine

## 2017-02-05 ENCOUNTER — Other Ambulatory Visit: Payer: Self-pay | Admitting: Internal Medicine

## 2017-02-05 VITALS — BP 116/60 | HR 83 | Temp 97.7°F | Ht 60.0 in | Wt 177.0 lb

## 2017-02-05 DIAGNOSIS — M545 Low back pain: Secondary | ICD-10-CM

## 2017-02-05 DIAGNOSIS — R739 Hyperglycemia, unspecified: Secondary | ICD-10-CM | POA: Diagnosis not present

## 2017-02-05 DIAGNOSIS — Z0001 Encounter for general adult medical examination with abnormal findings: Secondary | ICD-10-CM | POA: Diagnosis not present

## 2017-02-05 DIAGNOSIS — L409 Psoriasis, unspecified: Secondary | ICD-10-CM

## 2017-02-05 DIAGNOSIS — J8410 Pulmonary fibrosis, unspecified: Secondary | ICD-10-CM | POA: Diagnosis not present

## 2017-02-05 DIAGNOSIS — R079 Chest pain, unspecified: Secondary | ICD-10-CM

## 2017-02-05 DIAGNOSIS — M549 Dorsalgia, unspecified: Secondary | ICD-10-CM | POA: Insufficient documentation

## 2017-02-05 DIAGNOSIS — Z0000 Encounter for general adult medical examination without abnormal findings: Secondary | ICD-10-CM | POA: Insufficient documentation

## 2017-02-05 DIAGNOSIS — Z23 Encounter for immunization: Secondary | ICD-10-CM

## 2017-02-05 HISTORY — DX: Psoriasis, unspecified: L40.9

## 2017-02-05 LAB — LIPID PANEL
CHOLESTEROL: 206 mg/dL — AB (ref 0–200)
HDL: 46.7 mg/dL (ref >39.00)
NonHDL: 159.38
TRIGLYCERIDES: 205 mg/dL — AB (ref 0.0–149.0)
Total CHOL/HDL Ratio: 4
VLDL: 41 mg/dL — ABNORMAL HIGH (ref 0.0–40.0)

## 2017-02-05 LAB — CBC WITH DIFFERENTIAL/PLATELET
BASOS ABS: 0.1 10*3/uL (ref 0.0–0.1)
Basophils Relative: 1.2 % (ref 0.0–3.0)
EOS ABS: 0.2 10*3/uL (ref 0.0–0.7)
Eosinophils Relative: 2.5 % (ref 0.0–5.0)
HEMATOCRIT: 37.7 % (ref 36.0–46.0)
Hemoglobin: 12.9 g/dL (ref 12.0–15.0)
LYMPHS PCT: 20 % (ref 12.0–46.0)
Lymphs Abs: 1.9 10*3/uL (ref 0.7–4.0)
MCHC: 34.3 g/dL (ref 30.0–36.0)
MCV: 93.8 fl (ref 78.0–100.0)
Monocytes Absolute: 0.8 10*3/uL (ref 0.1–1.0)
Monocytes Relative: 8.7 % (ref 3.0–12.0)
NEUTROS ABS: 6.3 10*3/uL (ref 1.4–7.7)
NEUTROS PCT: 67.6 % (ref 43.0–77.0)
PLATELETS: 321 10*3/uL (ref 150.0–400.0)
RBC: 4.02 Mil/uL (ref 3.87–5.11)
RDW: 13.6 % (ref 11.5–15.5)
WBC: 9.3 10*3/uL (ref 4.0–10.5)

## 2017-02-05 LAB — HEPATIC FUNCTION PANEL
ALT: 19 U/L (ref 0–35)
AST: 18 U/L (ref 0–37)
Albumin: 4.6 g/dL (ref 3.5–5.2)
Alkaline Phosphatase: 48 U/L (ref 39–117)
BILIRUBIN DIRECT: 0.1 mg/dL (ref 0.0–0.3)
TOTAL PROTEIN: 7.5 g/dL (ref 6.0–8.3)
Total Bilirubin: 0.5 mg/dL (ref 0.2–1.2)

## 2017-02-05 LAB — TSH: TSH: 1.26 u[IU]/mL (ref 0.35–4.50)

## 2017-02-05 LAB — BASIC METABOLIC PANEL
BUN: 29 mg/dL — AB (ref 6–23)
CHLORIDE: 96 meq/L (ref 96–112)
CO2: 28 meq/L (ref 19–32)
CREATININE: 1.12 mg/dL (ref 0.40–1.20)
Calcium: 9.7 mg/dL (ref 8.4–10.5)
GFR: 49.9 mL/min — ABNORMAL LOW (ref >60.00)
GLUCOSE: 113 mg/dL — AB (ref 70–99)
Potassium: 4.5 mEq/L (ref 3.5–5.1)
Sodium: 135 mEq/L (ref 135–145)

## 2017-02-05 LAB — LDL CHOLESTEROL, DIRECT: Direct LDL: 138 mg/dL

## 2017-02-05 MED ORDER — TRAMADOL HCL 50 MG PO TABS: 50.0000 mg | ORAL_TABLET | Freq: Three times a day (TID) | ORAL | 1 refills | Status: DC | PRN

## 2017-02-05 NOTE — Assessment & Plan Note (Signed)
Asympt, stable overall by history and exam, recent data reviewed with pt, and pt to continue medical treatment as before,  to f/u any worsening symptoms or concerns, for a1c

## 2017-02-05 NOTE — Patient Instructions (Signed)
Please take all new medication as prescribed - the pain medication  Please continue all other medications as before, and refills have been done if requested.  Please have the pharmacy call with any other refills you may need.  Please continue your efforts at being more active, low cholesterol diet, and weight control.  You are otherwise up to date with prevention measures today.  Please keep your appointments with your specialists as you may have planned  Please go to the XRAY Department in the Basement (go straight as you get off the elevator) for the x-ray testing  Please go to the LAB in the Basement (turn left off the elevator) for the tests to be done today  You will be contacted by phone if any changes need to be made immediately.  Otherwise, you will receive a letter about your results with an explanation, but please check with MyChart first.  Please remember to sign up for MyChart if you have not done so, as this will be important to you in the future with finding out test results, communicating by private email, and scheduling acute appointments online when needed.  Please return in 6 months, or sooner if needed

## 2017-02-05 NOTE — Assessment & Plan Note (Signed)
etiology unclear, for t spine and ls spine films, pain control,  to f/u any worsening symptoms or concerns

## 2017-02-05 NOTE — Assessment & Plan Note (Signed)

## 2017-02-05 NOTE — Progress Notes (Signed)
Subjective:    Patient ID: Dawn Gomez, female    DOB: Jul 08, 1935, 81 y.o.   MRN: [id number]  HPI  Here for wellness and f/u;  Overall doing ok;  Pt denies worsening SOB, DOE, wheezing, orthopnea, PND, worsening LE edema, palpitations, dizziness or syncope.  Pt denies neurological change such as new headache, facial or extremity weakness.  Pt denies polydipsia, polyuria, or low sugar symptoms. Pt states overall good compliance with treatment and medications, good tolerability, and has been trying to follow appropriate diet.  Pt denies worsening depressive symptoms, suicidal ideation or panic. No fever, night sweats, wt loss, loss of appetite, or other constitutional symptoms.  Pt states good ability with ADL's, has low fall risk, home safety reviewed and adequate, no other significant changes in hearing or vision, and only occasionally active with exercise. Does also mention significant right post lateral chest pain/side pain 1 wk without swelling or rash, but sharp, intermittent, moderate, without cough or fever, and worse to lift something with the right arm or twist at the waist.  Also c/o lbp upper lumbar/lower thoracic x 1 wk, worse to bend forward and backwards, and Pt denies bowel or bladder change, fever, wt loss,  worsening LE pain/numbness/weakness, gait change or falls.   Past Medical History:  Diagnosis Date  . Abnormal heart rhythm   . Aortic aneurysm ([place])   . Dyspnea   . Hypothyroid   . OSA (obstructive sleep apnea)    on cpap  . Psoriasis 02/05/2017  . Pulmonary fibrosis ([place])   . SVT (supraventricular tachycardia) ([place])   . Thyroid disease    hypo   Past Surgical History:  Procedure Laterality Date  . ABDOMINAL HYSTERECTOMY    . APPENDECTOMY    . CHOLECYSTECTOMY    . CRANIOTOMY     for aneurysms  . CRANIOTOMY  1980 x 2   aneurysmal clipping  . HERNIA REPAIR    . TOTAL ABDOMINAL HYSTERECTOMY    . TUBAL LIGATION      reports that she quit smoking about 10 years  ago. Her smoking use included Cigarettes. She has a 100.00 pack-year smoking history. She has never used smokeless tobacco. She reports that she drinks alcohol. She reports that she does not use drugs. family history includes Allergies in her sister; Bone cancer in her father; Breast cancer in her sister; Cancer in her sister; Clotting disorder in her father and sister; Emphysema in her father, mother, and sister; Heart disease in her father and mother; Lung cancer in her father and mother; Prostate cancer in her father; Rheum arthritis in her father, mother, and sister. Allergies  Allergen Reactions  . Codeine   . Hydromorphone Hcl     REACTION: dementia  . Morphine   . Oxycodone-Acetaminophen   . Propoxyphene N-Acetaminophen   . Simvastatin    Current Outpatient Prescriptions on File Prior to Visit  Medication Sig Dispense Refill  . acetaminophen (TYLENOL) 500 MG tablet Take 500 mg by mouth every 6 (six) hours as needed.    . Ascorbic Acid (VITAMIN C PO) Take by mouth.    Marland Kitchen aspirin EC 81 MG tablet Take 81 mg by mouth daily.    Marland Kitchen atorvastatin (LIPITOR) 10 MG tablet Take 1 tablet (10 mg total) by mouth daily. 90 tablet 1  . CALCIUM-MAGNESIUM-ZINC PO Take 1 tablet by mouth daily.    . cholecalciferol (VITAMIN D) 1000 units tablet Take 5,000 Units by mouth daily.    Marland Kitchen diltiazem (CARDIZEM CD)  180 MG 24 hr capsule Take 1 capsule (180 mg total) by mouth daily. 90 capsule 1  . escitalopram (LEXAPRO) 20 MG tablet Take 1 tablet (20 mg total) by mouth at bedtime. 30 tablet 1  . fenofibrate (TRICOR) 145 MG tablet Take 145 mg by mouth daily.    . furosemide (LASIX) 40 MG tablet Take 1 tablet (40 mg total) by mouth daily. 30 tablet 0  . ipratropium-albuterol (DUONEB) 0.5-2.5 (3) MG/3ML SOLN Take 3 mLs by nebulization every 4 (four) hours as needed. 360 mL 0  . levothyroxine (SYNTHROID, LEVOTHROID) 50 MCG tablet Take 50 mcg by mouth daily before breakfast.    . lisinopril (PRINIVIL,ZESTRIL) 5 MG tablet  Take 1 tablet (5 mg total) by mouth daily. 90 tablet 1  . potassium chloride SA (K-DUR,KLOR-CON) 20 MEQ tablet Take 20 mEq by mouth every morning.    . umeclidinium-vilanterol (ANORO ELLIPTA) 62.5-25 MCG/INH AEPB Inhale 1 puff into the lungs daily. 2 each 0  . vitamin B-12 (CYANOCOBALAMIN) 1000 MCG tablet Take 500 mcg by mouth daily.    . vitamin C (ASCORBIC ACID) 500 MG tablet Take 500 mg by mouth daily.    . Vitamin D, Ergocalciferol, (DRISDOL) 50000 units CAPS capsule Take 1 capsule (50,000 Units total) by mouth every 7 (seven) days. 12 capsule 0  . vitamin E 400 UNIT capsule Take 200 Units by mouth daily.     No current facility-administered medications on file prior to visit.    Review of Systems  Constitutional: Negative for other unusual diaphoresis or sweats HENT: Negative for ear discharge or swelling Eyes: Negative for other worsening visual disturbances Respiratory: Negative for stridor or other swelling  Gastrointestinal: Negative for worsening distension or other blood Genitourinary: Negative for retention or other urinary change Musculoskeletal: Negative for other MSK pain or swelling Skin: Negative for color change or other new lesions Neurological: Negative for worsening tremors and other numbness  Psychiatric/Behavioral: Negative for worsening agitation or other fatigue All other system neg per pt    Objective:   Physical Exam BP 116/60   Pulse 83   Temp 97.7 F (36.5 C) (Oral)   Ht 5' (1.524 m)   Wt 177 lb (80.3 kg)   SpO2 94%   BMI 34.57 kg/m  - 4L home o2 VS noted, not ill appaering Constitutional: Pt appears in NAD HENT: Head: NCAT.  Right Ear: External ear normal.  Left Ear: External ear normal.  Eyes: . Pupils are equal, round, and reactive to light. Conjunctivae and EOM are normal Nose: without d/c or deformity Neck: Neck supple. Gross normal ROM Cardiovascular: Normal rate and regular rhythm.   Pulmonary/Chest: Effort normal and breath sounds  without rales or wheezing.  Abd:  Soft, NT, ND, + BS, no organomegaly MSK: tender area post axillary line right about t10-t4 levels without rash; also tender midline spine upper lumbar/lower thoracic Neurological: Pt is alert. At baseline orientation, motor grossly intact Skin: Skin is warm. No rashes, other new lesions, no LE edema Psychiatric: Pt behavior is normal without agitation  No other exam findings    Assessment & Plan:

## 2017-02-05 NOTE — Assessment & Plan Note (Addendum)
Suspect MSK, for cxr, cont pain control  In addition to the time spent performing CPE, I spent an additional 25 minutes face to face,in which greater than 50% of this time was spent in counseling and coordination of care for patient's acute illness as documented, including the differential dx, tx, further evaluation and other management of chest pain, low back pain, and hyperglycemia

## 2017-02-06 ENCOUNTER — Encounter: Payer: Self-pay | Admitting: Internal Medicine

## 2017-02-06 ENCOUNTER — Telehealth: Payer: Self-pay | Admitting: Internal Medicine

## 2017-02-06 LAB — URINALYSIS, ROUTINE W REFLEX MICROSCOPIC
Bilirubin Urine: NEGATIVE
Hgb urine dipstick: NEGATIVE
Ketones, ur: NEGATIVE
Nitrite: NEGATIVE
PH: 5.5 (ref 5.0–8.0)
RBC / HPF: NONE SEEN (ref 0–?)
SPECIFIC GRAVITY, URINE: 1.025 (ref 1.000–1.030)
TOTAL PROTEIN, URINE-UPE24: NEGATIVE
URINE GLUCOSE: NEGATIVE
UROBILINOGEN UA: 0.2 (ref 0.0–1.0)

## 2017-02-06 LAB — HEMOGLOBIN A1C: Hgb A1c MFr Bld: 5.7 % (ref 4.6–6.5)

## 2017-02-06 MED ORDER — ROSUVASTATIN CALCIUM 20 MG PO TABS: 20.0000 mg | ORAL_TABLET | Freq: Every day | ORAL | 3 refills | Status: DC

## 2017-02-06 MED ORDER — ATORVASTATIN CALCIUM 20 MG PO TABS: 20.0000 mg | ORAL_TABLET | Freq: Every day | ORAL | 3 refills | Status: DC

## 2017-02-06 NOTE — Addendum Note (Signed)
Addended by: Biagio Borg on: 02/06/2017 03:08 PM   Modules accepted: Orders

## 2017-02-06 NOTE — Telephone Encounter (Signed)
Ok to change to crestor 20 mg per day - done erx

## 2017-02-06 NOTE — Telephone Encounter (Signed)
Pt returned your call regarding her lab results. I gave her Dr Gwynn Burly response. She said that while taking Atorvastatin in the past, she had symptoms of shortness of breath and felt very weak. Marya Amsler told her that he did not think that it was coming from the medication at that time and had her start back on it. She said that when she took it again, she had the same symptoms. She would like to try something else. She mentioned that she is also not able to take Simvastatin

## 2017-02-06 NOTE — Telephone Encounter (Signed)
Pt has been informed and expressed understanding.  

## 2017-02-11 ENCOUNTER — Other Ambulatory Visit: Payer: Self-pay | Admitting: Internal Medicine

## 2017-02-11 DIAGNOSIS — J9621 Acute and chronic respiratory failure with hypoxia: Secondary | ICD-10-CM | POA: Diagnosis not present

## 2017-02-11 DIAGNOSIS — J449 Chronic obstructive pulmonary disease, unspecified: Secondary | ICD-10-CM | POA: Diagnosis not present

## 2017-02-11 DIAGNOSIS — J9610 Chronic respiratory failure, unspecified whether with hypoxia or hypercapnia: Secondary | ICD-10-CM | POA: Diagnosis not present

## 2017-02-11 MED ORDER — ESCITALOPRAM OXALATE 20 MG PO TABS: 20.0000 mg | ORAL_TABLET | Freq: Every day | ORAL | 3 refills | Status: DC

## 2017-02-19 ENCOUNTER — Ambulatory Visit: Payer: Medicare Other | Admitting: Pulmonary Disease

## 2017-02-19 ENCOUNTER — Telehealth: Payer: Self-pay | Admitting: Internal Medicine

## 2017-02-19 NOTE — Telephone Encounter (Signed)
I agree  I dont think the patient per EMR has had prior tx with tegretol and need for chronic medication refill

## 2017-02-19 NOTE — Telephone Encounter (Signed)
The pharmacy called stating that the pt told them that she was given a written script for tegretol but cannot find it and needs it filled. I do not see this on her medication list. Please advise.

## 2017-02-20 MED ORDER — TRAMADOL HCL 50 MG PO TABS: 50.0000 mg | ORAL_TABLET | Freq: Three times a day (TID) | ORAL | 2 refills | Status: DC | PRN

## 2017-02-20 NOTE — Telephone Encounter (Signed)
Pt stated the medication she is speaking of is Tramadol. Please advise.

## 2017-02-20 NOTE — Telephone Encounter (Signed)
Pt has been informed.

## 2017-02-20 NOTE — Telephone Encounter (Signed)
Done erx 

## 2017-02-20 NOTE — Telephone Encounter (Signed)
Can you follow up on this please.

## 2017-03-12 ENCOUNTER — Ambulatory Visit: Payer: Medicare Other | Admitting: Pulmonary Disease

## 2017-03-12 NOTE — Progress Notes (Deleted)
Subjective:    Patient ID: Dawn Gomez, female    DOB: 1935/11/30, 81 y.o.   MRN: [id number] Synopsis:  Former patient of Dr. Gwenette Gomez has pulmonary fibrosis of uncertain etiology.  HPI No chief complaint on file.  ***last visit start anoro  Past Medical History:  Diagnosis Date  . Abnormal heart rhythm   . Aortic aneurysm ([place])   . Dyspnea   . Hypothyroid   . OSA (obstructive sleep apnea)    on cpap  . Psoriasis 02/05/2017  . Pulmonary fibrosis ([place])   . SVT (supraventricular tachycardia) ([place])   . Thyroid disease    hypo     Review of Systems  Constitutional: Positive for fatigue. Negative for chills and fever.  HENT: Negative for postnasal drip, rhinorrhea and sinus pressure.   Respiratory: Positive for shortness of breath. Negative for cough and wheezing.   Cardiovascular: Negative for chest pain, palpitations and leg swelling.       Objective:   Physical Exam There were no vitals filed for this visit. 2L [place]  ***   LABS  2009:  ANA, RF, ACE, ESR, all ok.  Autoimmune 01/2014:  Negative 04/2015 ANA, RF, CCP, JO-1, SSA, SSB, SCL-70, Anti-centromere all negative  PFT's  10/2010:  No obstruction, TLC 3.05 (73% pred), DLCO 6.1 (31%) PFT's 2013:  No obstruction, TLC 3.05 (73%), DLCO 6.3 (31%) PFT's 03/2013:  FEV1 1.52 (87%), ratio 88, FVC 1.73 (74%), TLC 2.80 (62%), DLCO 7.22 (38%) PFT's 01/2014:  FEV1 1.54 (90%), ratio 87, FVC 1.78 (76%), TLC 2.90 (65%), DLCO 6.22 (32%) 04/2015 PFT> FVC 1.67 L (73% predicted), total lung capacity 2.76 L (62% predicted), DLCO 6.50 (34% predicted). July 2017 pulmonary function testing ratio 89%, FVC 1.72 L 76% predicted, total lung capacity 3.08 L, 69% predicted, DLCO 5.89 31% predicted July 2018 pulmonary function tests: Ratio 91%, FVC 1.69 L, 76% predicted (pre FVC 1.84L) , total lung capacity 2.98 L 66% predicted, DLCO 5.18 27 percent protected  Imaging: CXR 10/2010:  Stable interstitial changes. CXR 2013:  Stable IS  changes CT chest 2014:  Subpleural reticulation, ?early honeycombing, per chest radiology >> no definite GGO, may be UIP or maybe not.  HRCT 01/2014:  Slight progression of ISLD, and pattern suggestive of UIP 03/2015 CT chest , stable fibrosis since 2014  2018 high-resolution CT chest images independently reviewed showing upper lobe predominant centrilobular emphysema with nonspecific patchy fibrotic changes in the bases somewhat in a bronchovascular distribution,  Notes from her recent visit with her primary care physician reviewed where she was seen for chest pain and some back pain.  Other: ONO on RA with cpap 06/2012:  Low sat 85%, only 7 min less than 88% entire night.  Ambul ox 07/2013:  desat to 87% transiently.  Pt would like to hold off on portable oxygen   Echo: TTE 06/2012:  Normal LV, DD, normal RV, no evidence for pulmonary htn.        Assessment & Plan:  No diagnosis found.  Discussion:  Dawn Gomez has an ill-defined interstitial lung disease which based on lung function testing this year has shown little evidence of progression. I've looked at the images from the CT scan of her chest which showed fibrotic changes but were not consistent with usual interstitial pneumonitis. So she may have some form of nonspecific interstitial pneumonitis regardless the cause at this time I see no indication for anti-fibrotic therapy.  Notable on her CT scan of the chest with centrilobular emphysema. Because  of this I like to start her on bronchodilators to see if it helps improve her symptoms.  She's been having a lot of trouble with her supplemental oxygen. She really needs to use supplemental oxygen at 4 L/m continuous not pulse flow. We are going to write a prescription for this there is [facility]. I'm also going to prescribe an oxygen conserving device.  Plan:  For your Interstitial lung disease: There is no indication for medication right now  For your centrilobular  emphysema: Take the Anoro one puff daily no matter how you feel, if it helps call me and let me know Use albuterol as needed for shortness of breath Flu shot in the fall  For your chronic respiratory failure with hypoxemia: When you are out of the house you need to have tanks that will facilitate 4 L/m of continuous flow oxygen, we will write an order for this with advanced home care Continue using her oxygen concentrator at home We will prescribe a Oxymizer device to help conserve the amount of oxygen use need  We will see you back in 3 months or sooner  Current Outpatient Medications:  .  acetaminophen (TYLENOL) 500 MG tablet, Take 500 mg by mouth every 6 (six) hours as needed., Disp: , Rfl:  .  Ascorbic Acid (VITAMIN C PO), Take by mouth., Disp: , Rfl:  .  aspirin EC 81 MG tablet, Take 81 mg by mouth daily., Disp: , Rfl:  .  CALCIUM-MAGNESIUM-ZINC PO, Take 1 tablet by mouth daily., Disp: , Rfl:  .  cholecalciferol (VITAMIN D) 1000 units tablet, Take 5,000 Units by mouth daily., Disp: , Rfl:  .  diltiazem (CARDIZEM CD) 180 MG 24 hr capsule, Take 1 capsule (180 mg total) by mouth daily., Disp: 90 capsule, Rfl: 1 .  escitalopram (LEXAPRO) 20 MG tablet, Take 1 tablet (20 mg total) at bedtime by mouth., Disp: 90 tablet, Rfl: 3 .  fenofibrate (TRICOR) 145 MG tablet, Take 145 mg by mouth daily., Disp: , Rfl:  .  furosemide (LASIX) 40 MG tablet, Take 1 tablet (40 mg total) by mouth daily., Disp: 30 tablet, Rfl: 0 .  ipratropium-albuterol (DUONEB) 0.5-2.5 (3) MG/3ML SOLN, Take 3 mLs by nebulization every 4 (four) hours as needed., Disp: 360 mL, Rfl: 0 .  levothyroxine (SYNTHROID, LEVOTHROID) 50 MCG tablet, Take 50 mcg by mouth daily before breakfast., Disp: , Rfl:  .  lisinopril (PRINIVIL,ZESTRIL) 5 MG tablet, Take 1 tablet (5 mg total) by mouth daily., Disp: 90 tablet, Rfl: 1 .  potassium chloride SA (K-DUR,KLOR-CON) 20 MEQ tablet, Take 20 mEq by mouth every morning., Disp: , Rfl:  .   rosuvastatin (CRESTOR) 20 MG tablet, Take 1 tablet (20 mg total) by mouth daily., Disp: 90 tablet, Rfl: 3 .  traMADol (ULTRAM) 50 MG tablet, Take 1 tablet (50 mg total) every 8 (eight) hours as needed by mouth., Disp: 60 tablet, Rfl: 2 .  umeclidinium-vilanterol (ANORO ELLIPTA) 62.5-25 MCG/INH AEPB, Inhale 1 puff into the lungs daily., Disp: 2 each, Rfl: 0 .  vitamin B-12 (CYANOCOBALAMIN) 1000 MCG tablet, Take 500 mcg by mouth daily., Disp: , Rfl:  .  vitamin C (ASCORBIC ACID) 500 MG tablet, Take 500 mg by mouth daily., Disp: , Rfl:  .  Vitamin D, Ergocalciferol, (DRISDOL) 50000 units CAPS capsule, Take 1 capsule (50,000 Units total) by mouth every 7 (seven) days., Disp: 12 capsule, Rfl: 0 .  vitamin E 400 UNIT capsule, Take 200 Units by mouth daily., Disp: ,  Rfl:

## 2017-03-13 ENCOUNTER — Other Ambulatory Visit: Payer: Self-pay | Admitting: Internal Medicine

## 2017-03-13 DIAGNOSIS — J9610 Chronic respiratory failure, unspecified whether with hypoxia or hypercapnia: Secondary | ICD-10-CM | POA: Diagnosis not present

## 2017-03-13 DIAGNOSIS — J9621 Acute and chronic respiratory failure with hypoxia: Secondary | ICD-10-CM | POA: Diagnosis not present

## 2017-03-13 DIAGNOSIS — J449 Chronic obstructive pulmonary disease, unspecified: Secondary | ICD-10-CM | POA: Diagnosis not present

## 2017-03-14 ENCOUNTER — Ambulatory Visit: Payer: Medicare Other | Admitting: Pulmonary Disease

## 2017-03-20 ENCOUNTER — Other Ambulatory Visit: Payer: Self-pay | Admitting: Internal Medicine

## 2017-04-09 ENCOUNTER — Other Ambulatory Visit: Payer: Self-pay | Admitting: Internal Medicine

## 2017-04-10 ENCOUNTER — Other Ambulatory Visit: Payer: Self-pay

## 2017-04-10 MED ORDER — VITAMIN D (ERGOCALCIFEROL) 1.25 MG (50000 UNIT) PO CAPS: 50000.0000 [IU] | ORAL_CAPSULE | ORAL | 0 refills | Status: DC

## 2017-04-13 DIAGNOSIS — G4733 Obstructive sleep apnea (adult) (pediatric): Secondary | ICD-10-CM | POA: Diagnosis not present

## 2017-04-13 DIAGNOSIS — J8410 Pulmonary fibrosis, unspecified: Secondary | ICD-10-CM | POA: Diagnosis not present

## 2017-04-13 DIAGNOSIS — J9610 Chronic respiratory failure, unspecified whether with hypoxia or hypercapnia: Secondary | ICD-10-CM | POA: Diagnosis not present

## 2017-04-13 DIAGNOSIS — J9621 Acute and chronic respiratory failure with hypoxia: Secondary | ICD-10-CM | POA: Diagnosis not present

## 2017-04-13 DIAGNOSIS — J449 Chronic obstructive pulmonary disease, unspecified: Secondary | ICD-10-CM | POA: Diagnosis not present

## 2017-04-19 ENCOUNTER — Other Ambulatory Visit: Payer: Self-pay | Admitting: Internal Medicine

## 2017-04-21 NOTE — Telephone Encounter (Signed)
Done erx 

## 2017-04-23 ENCOUNTER — Ambulatory Visit: Payer: Medicare Other | Admitting: Pulmonary Disease

## 2017-05-05 ENCOUNTER — Other Ambulatory Visit: Payer: Self-pay | Admitting: Internal Medicine

## 2017-05-08 ENCOUNTER — Telehealth: Payer: Self-pay | Admitting: Internal Medicine

## 2017-05-08 MED ORDER — LISINOPRIL 5 MG PO TABS: 5.0000 mg | ORAL_TABLET | Freq: Every day | ORAL | 1 refills | Status: DC

## 2017-05-08 NOTE — Telephone Encounter (Signed)
Copied from [place] [id number]. Topic: Quick Communication - Rx Refill/Question >> May 08, 2017  1:41 PM Ahmed Prima L wrote: Medication: lisinopril (PRINIVIL,ZESTRIL) 5 MG tablet   Has the patient contacted their pharmacy? yes  (Agent: If no, request that the patient contact the pharmacy for the refill.)   Preferred Pharmacy (with phone number or street name):[facility] [place], [place] - [address]    Agent: Please be advised that RX refills may take up to 3 business days. We ask that you follow-up with your pharmacy.

## 2017-05-08 NOTE — Telephone Encounter (Signed)
lisinopril refill Last OV: 02/05/17 Last Refill: 01/20/17 #90 tabs 1 RF Pharmacy:[facility]

## 2017-05-14 DIAGNOSIS — J8410 Pulmonary fibrosis, unspecified: Secondary | ICD-10-CM | POA: Diagnosis not present

## 2017-05-14 DIAGNOSIS — J9621 Acute and chronic respiratory failure with hypoxia: Secondary | ICD-10-CM | POA: Diagnosis not present

## 2017-05-14 DIAGNOSIS — J449 Chronic obstructive pulmonary disease, unspecified: Secondary | ICD-10-CM | POA: Diagnosis not present

## 2017-05-14 DIAGNOSIS — G4733 Obstructive sleep apnea (adult) (pediatric): Secondary | ICD-10-CM | POA: Diagnosis not present

## 2017-05-14 DIAGNOSIS — J9610 Chronic respiratory failure, unspecified whether with hypoxia or hypercapnia: Secondary | ICD-10-CM | POA: Diagnosis not present

## 2017-05-22 DIAGNOSIS — D3132 Benign neoplasm of left choroid: Secondary | ICD-10-CM | POA: Diagnosis not present

## 2017-05-22 DIAGNOSIS — H353131 Nonexudative age-related macular degeneration, bilateral, early dry stage: Secondary | ICD-10-CM | POA: Diagnosis not present

## 2017-05-22 DIAGNOSIS — H26493 Other secondary cataract, bilateral: Secondary | ICD-10-CM | POA: Diagnosis not present

## 2017-05-22 DIAGNOSIS — H1851 Endothelial corneal dystrophy: Secondary | ICD-10-CM | POA: Diagnosis not present

## 2017-05-22 DIAGNOSIS — E119 Type 2 diabetes mellitus without complications: Secondary | ICD-10-CM | POA: Diagnosis not present

## 2017-05-22 DIAGNOSIS — Z961 Presence of intraocular lens: Secondary | ICD-10-CM | POA: Diagnosis not present

## 2017-05-23 ENCOUNTER — Other Ambulatory Visit: Payer: Self-pay | Admitting: Internal Medicine

## 2017-06-02 ENCOUNTER — Other Ambulatory Visit: Payer: Self-pay | Admitting: Internal Medicine

## 2017-06-04 ENCOUNTER — Ambulatory Visit: Payer: Self-pay | Admitting: Pulmonary Disease

## 2017-06-11 DIAGNOSIS — J9621 Acute and chronic respiratory failure with hypoxia: Secondary | ICD-10-CM | POA: Diagnosis not present

## 2017-06-11 DIAGNOSIS — J449 Chronic obstructive pulmonary disease, unspecified: Secondary | ICD-10-CM | POA: Diagnosis not present

## 2017-06-11 DIAGNOSIS — G4733 Obstructive sleep apnea (adult) (pediatric): Secondary | ICD-10-CM | POA: Diagnosis not present

## 2017-06-11 DIAGNOSIS — J9610 Chronic respiratory failure, unspecified whether with hypoxia or hypercapnia: Secondary | ICD-10-CM | POA: Diagnosis not present

## 2017-06-11 DIAGNOSIS — J8410 Pulmonary fibrosis, unspecified: Secondary | ICD-10-CM | POA: Diagnosis not present

## 2017-06-20 ENCOUNTER — Ambulatory Visit: Payer: Self-pay | Admitting: Pulmonary Disease

## 2017-06-23 ENCOUNTER — Other Ambulatory Visit: Payer: Self-pay | Admitting: Internal Medicine

## 2017-06-23 NOTE — Telephone Encounter (Signed)
Should not be due until April mid

## 2017-07-03 DIAGNOSIS — R0602 Shortness of breath: Secondary | ICD-10-CM | POA: Diagnosis not present

## 2017-07-03 DIAGNOSIS — Z8679 Personal history of other diseases of the circulatory system: Secondary | ICD-10-CM | POA: Diagnosis not present

## 2017-07-03 DIAGNOSIS — I2781 Cor pulmonale (chronic): Secondary | ICD-10-CM | POA: Diagnosis not present

## 2017-07-03 DIAGNOSIS — J8410 Pulmonary fibrosis, unspecified: Secondary | ICD-10-CM | POA: Diagnosis not present

## 2017-07-07 ENCOUNTER — Ambulatory Visit: Payer: Self-pay

## 2017-07-07 NOTE — Telephone Encounter (Signed)
Pt calling with lightheadedness, and "cold sweats" that began 1 month ago. Pt states that she becomes diaphoretic when she does anything that requires exertion. Pt is wondering if one of her meds could be causing this. Pt is also having muscle cramps at bedtime and in the early morning. Pt purchased tonic water with quinine and states it has helped. Pt is asking for help with the lightheadedness, the diaphoresis and muscle cramping. Pt states she is drinking enough water. Appt made with PCP for tomorrow. Pt advised to call back if she faints or worsens. Reason for Disposition . Taking a medicine that could cause dizziness (e.g., blood pressure medications, diuretics)  Answer Assessment - Initial Assessment Questions 1. DESCRIPTION: "Describe your dizziness."     Feels unsteady on her feet does ok once pt gets gets her balance back 2. LIGHTHEADED: "Do you feel lightheaded?" (e.g., somewhat faint, woozy, weak upon standing)     Yes- staggers when she gets up if she doesn't stop and catch her breathe and regain her balance 3. VERTIGO: "Do you feel like either you or the room is spinning or tilting?" (i.e. vertigo)     no 4. SEVERITY: "How bad is it?"  "Do you feel like you are going to faint?" "Can you stand and walk?"   - MILD - walking normally   - MODERATE - interferes with normal activities (e.g., work, school)    - SEVERE - unable to stand, requires support to walk, feels like passing out now.      mild 5. ONSET:  "When did the dizziness begin?"     "a month or so" 6. AGGRAVATING FACTORS: "Does anything make it worse?" (e.g., standing, change in head position)     Standing  7. HEART RATE: "Can you tell me your heart rate?" "How many beats in 15 seconds?"  (Note: not all patients can do this)       17 bpm x 4= 68 8. CAUSE: "What do you think is causing the dizziness?" " I dont know" Maybe medication. 9. RECURRENT SYMPTOM: "Have you had dizziness before?" If so, ask: "When was the last time?"  "What happened that time?"     Yes- this am  10. OTHER SYMPTOMS: "Do you have any other symptoms?" (e.g., fever, chest pain, vomiting, diarrhea, bleeding)       Cold sweats , diaphoretic, nausea 11. PREGNANCY: "Is there any chance you are pregnant?" "When was your last menstrual period?"       n/a  Protocols used: DIZZINESS [facility]

## 2017-07-08 ENCOUNTER — Ambulatory Visit (INDEPENDENT_AMBULATORY_CARE_PROVIDER_SITE_OTHER): Payer: Medicare HMO | Admitting: Internal Medicine

## 2017-07-08 ENCOUNTER — Encounter: Payer: Self-pay | Admitting: Internal Medicine

## 2017-07-08 ENCOUNTER — Other Ambulatory Visit (INDEPENDENT_AMBULATORY_CARE_PROVIDER_SITE_OTHER): Payer: Medicare HMO

## 2017-07-08 VITALS — BP 118/76 | HR 96 | Temp 97.8°F | Ht 60.0 in | Wt 180.0 lb

## 2017-07-08 DIAGNOSIS — R61 Generalized hyperhidrosis: Secondary | ICD-10-CM | POA: Diagnosis not present

## 2017-07-08 DIAGNOSIS — R739 Hyperglycemia, unspecified: Secondary | ICD-10-CM | POA: Diagnosis not present

## 2017-07-08 DIAGNOSIS — R300 Dysuria: Secondary | ICD-10-CM | POA: Diagnosis not present

## 2017-07-08 LAB — URINALYSIS, ROUTINE W REFLEX MICROSCOPIC
Bilirubin Urine: NEGATIVE
Hgb urine dipstick: NEGATIVE
KETONES UR: NEGATIVE
Nitrite: NEGATIVE
PH: 6 (ref 5.0–8.0)
RBC / HPF: NONE SEEN (ref 0–?)
SPECIFIC GRAVITY, URINE: 1.02 (ref 1.000–1.030)
Total Protein, Urine: NEGATIVE
UROBILINOGEN UA: 0.2 (ref 0.0–1.0)
Urine Glucose: NEGATIVE

## 2017-07-08 MED ORDER — CIPROFLOXACIN HCL 500 MG PO TABS: 500.0000 mg | ORAL_TABLET | Freq: Two times a day (BID) | ORAL | 0 refills | Status: AC

## 2017-07-08 NOTE — Patient Instructions (Signed)
Please take all new medication as prescribed - the cipro  Please continue all other medications as before, and refills have been done if requested.  Please have the pharmacy call with any other refills you may need.  Please keep your appointments with your specialists as you may have planned  Please go to the LAB in the Basement (turn left off the elevator) for the tests to be done today  You will be contacted by phone if any changes need to be made immediately.  Otherwise, you will receive a letter about your results with an explanation, but please check with MyChart first.  Please remember to sign up for MyChart if you have not done so, as this will be important to you in the future with finding out test results, communicating by private email, and scheduling acute appointments online when needed.

## 2017-07-08 NOTE — Progress Notes (Signed)
Subjective:    Patient ID: Dawn Gomez, female    DOB: Oct 17, 1935, 82 y.o.   MRN: [id number]  HPI  Here with c/o 3 days onset clammy cold sweats and dysuria with frequency but Denies urinary symptoms such as urgency, flank pain, hematuria or n/v, fever, chills, but has hx of recurrent UTI.   Recent ECG ok per pt, has echo planned soon.  Denies worsening depressive symptoms, suicidal ideation, or panic; and denies worsening anxiety. Pt denies chest pain, increased sob or doe, wheezing, orthopnea, PND, increased LE swelling, palpitations, dizziness or syncope, and remains on home o2 4L  Plans to make appt with dermatology for left distal leg lesion getting larger, has hx of skin cancer.  Also with recent leg cramps better it seems with taking tonic water before bed.  Overall good compliance with treatment, and good medicine tolerability.  No other interval hx or new complaints.  Pt denies polydipsia, polyuria.  Past Medical History:  Diagnosis Date  . Abnormal heart rhythm   . Aortic aneurysm ([place])   . Dyspnea   . Hypothyroid   . OSA (obstructive sleep apnea)    on cpap  . Psoriasis 02/05/2017  . Pulmonary fibrosis ([place])   . SVT (supraventricular tachycardia) ([place])   . Thyroid disease    hypo   Past Surgical History:  Procedure Laterality Date  . ABDOMINAL HYSTERECTOMY    . APPENDECTOMY    . CHOLECYSTECTOMY    . CRANIOTOMY     for aneurysms  . CRANIOTOMY  1980 x 2   aneurysmal clipping  . HERNIA REPAIR    . TOTAL ABDOMINAL HYSTERECTOMY    . TUBAL LIGATION      reports that she quit smoking about 11 years ago. Her smoking use included cigarettes. She has a 100.00 pack-year smoking history. She has never used smokeless tobacco. She reports that she drinks alcohol. She reports that she does not use drugs. family history includes Allergies in her sister; Bone cancer in her father; Breast cancer in her sister; Cancer in her sister; Clotting disorder in her father and sister; Emphysema  in her father, mother, and sister; Heart disease in her father and mother; Lung cancer in her father and mother; Prostate cancer in her father; Rheum arthritis in her father, mother, and sister. Allergies  Allergen Reactions  . Lipitor [Atorvastatin] Shortness Of Breath, Diarrhea and Other (See Comments)    Dizzy, pain all over.  . Codeine   . Hydromorphone Hcl     REACTION: dementia  . Levaquin [Levofloxacin In D5w]     Shock per pt but can take cipro  . Morphine   . Oxycodone-Acetaminophen   . Propoxyphene N-Acetaminophen   . Simvastatin    Current Outpatient Medications on File Prior to Visit  Medication Sig Dispense Refill  . acetaminophen (TYLENOL) 500 MG tablet Take 500 mg by mouth every 6 (six) hours as needed.    . Ascorbic Acid (VITAMIN C PO) Take by mouth.    Marland Kitchen aspirin EC 81 MG tablet Take 81 mg by mouth daily.    Marland Kitchen CALCIUM-MAGNESIUM-ZINC PO Take 1 tablet by mouth daily.    . cholecalciferol (VITAMIN D) 1000 units tablet Take 5,000 Units by mouth daily.    Marland Kitchen diltiazem (CARDIZEM CD) 180 MG 24 hr capsule Take 1 capsule (180 mg total) by mouth daily. 90 capsule 1  . escitalopram (LEXAPRO) 20 MG tablet Take 1 tablet (20 mg total) at bedtime by mouth. 90 tablet 3  .  fenofibrate (TRICOR) 145 MG tablet Take 145 mg by mouth daily.    . furosemide (LASIX) 40 MG tablet Take 1 tablet (40 mg total) by mouth daily. 30 tablet 5  . ipratropium-albuterol (DUONEB) 0.5-2.5 (3) MG/3ML SOLN Take 3 mLs by nebulization every 4 (four) hours as needed. 360 mL 0  . levothyroxine (SYNTHROID, LEVOTHROID) 100 MCG tablet Take 1 tablet (100 mcg) by mouth daily *emergency fill* 90 tablet 3  . levothyroxine (SYNTHROID, LEVOTHROID) 50 MCG tablet Take 50 mcg by mouth daily before breakfast.    . lisinopril (PRINIVIL,ZESTRIL) 5 MG tablet Take 1 tablet (5 mg total) by mouth daily. 90 tablet 1  . potassium chloride SA (K-DUR,KLOR-CON) 20 MEQ tablet Take 1 tablet (20 mEq) by mouth daily with food *emergency fill*  30 tablet 5  . rosuvastatin (CRESTOR) 20 MG tablet Take 1 tablet (20 mg total) by mouth daily. 90 tablet 3  . traMADol (ULTRAM) 50 MG tablet Take 1 tablet (50 mg total) every 8 (eight) hours as needed by mouth. 60 tablet 2  . umeclidinium-vilanterol (ANORO ELLIPTA) 62.5-25 MCG/INH AEPB Inhale 1 puff into the lungs daily. 2 each 0  . vitamin B-12 (CYANOCOBALAMIN) 1000 MCG tablet Take 500 mcg by mouth daily.    . vitamin C (ASCORBIC ACID) 500 MG tablet Take 500 mg by mouth daily.    . Vitamin D, Ergocalciferol, (DRISDOL) 50000 units CAPS capsule Take 1 capsule (50,000 Units total) by mouth every 7 (seven) days. 12 capsule 0  . vitamin E 400 UNIT capsule Take 200 Units by mouth daily.     No current facility-administered medications on file prior to visit.    Review of Systems  Constitutional: Negative for other unusual diaphoresis or sweats HENT: Negative for ear discharge or swelling Eyes: Negative for other worsening visual disturbances Respiratory: Negative for stridor or other swelling  Gastrointestinal: Negative for worsening distension or other blood Genitourinary: Negative for retention or other urinary change Musculoskeletal: Negative for other MSK pain or swelling Skin: Negative for color change or other new lesions Neurological: Negative for worsening tremors and other numbness  Psychiatric/Behavioral: Negative for worsening agitation or other fatigue All other system neg per pt    Objective:   Physical Exam BP 118/76   Pulse 96   Temp 97.8 F (36.6 C) (Oral)   Ht 5' (1.524 m)   Wt 180 lb (81.6 kg)   SpO2 97%   BMI 35.15 kg/m  VS noted,  Constitutional: Pt appears in NAD HENT: Head: NCAT.  Right Ear: External ear normal.  Left Ear: External ear normal.  Eyes: . Pupils are equal, round, and reactive to light. Conjunctivae and EOM are normal Nose: without d/c or deformity Neck: Neck supple. Gross normal ROM Cardiovascular: Normal rate and regular rhythm.     Pulmonary/Chest: Effort normal and breath sounds decreased without rales or wheezing.  Abd:  Soft, ND, + BS, no organomegaly with tender low mid abd without guarding or rebound Neurological: Pt is alert. At baseline orientation, motor grossly intact Skin: Skin is warm. No rashes, other new lesions, no LE edema Psychiatric: Pt behavior is normal without agitation  No other exam findings    Assessment & Plan:

## 2017-07-10 LAB — URINE CULTURE
MICRO NUMBER:: 90406277
SPECIMEN QUALITY:: ADEQUATE

## 2017-07-11 ENCOUNTER — Other Ambulatory Visit: Payer: Self-pay | Admitting: Internal Medicine

## 2017-07-11 ENCOUNTER — Other Ambulatory Visit: Payer: Self-pay | Admitting: Family Medicine

## 2017-07-11 DIAGNOSIS — Z1231 Encounter for screening mammogram for malignant neoplasm of breast: Secondary | ICD-10-CM

## 2017-07-12 DIAGNOSIS — J8410 Pulmonary fibrosis, unspecified: Secondary | ICD-10-CM | POA: Diagnosis not present

## 2017-07-12 DIAGNOSIS — J449 Chronic obstructive pulmonary disease, unspecified: Secondary | ICD-10-CM | POA: Diagnosis not present

## 2017-07-12 DIAGNOSIS — J9621 Acute and chronic respiratory failure with hypoxia: Secondary | ICD-10-CM | POA: Diagnosis not present

## 2017-07-12 DIAGNOSIS — G4733 Obstructive sleep apnea (adult) (pediatric): Secondary | ICD-10-CM | POA: Diagnosis not present

## 2017-07-12 DIAGNOSIS — J9610 Chronic respiratory failure, unspecified whether with hypoxia or hypercapnia: Secondary | ICD-10-CM | POA: Diagnosis not present

## 2017-07-13 DIAGNOSIS — R61 Generalized hyperhidrosis: Secondary | ICD-10-CM | POA: Insufficient documentation

## 2017-07-13 NOTE — Assessment & Plan Note (Signed)
Mild to mod, for antibx course after urine studies, to f/u any worsening symptoms or concerns

## 2017-07-13 NOTE — Assessment & Plan Note (Signed)
Chronic persistent, no hx of malignancy or TB or chronic infection, likely worse recently in last few days due to probable UTI, for tx as above

## 2017-07-13 NOTE — Assessment & Plan Note (Signed)
Lab Results  Component Value Date   HGBA1C 5.7 02/05/2017  stable overall by history and exam, recent data reviewed with pt, and pt to continue medical treatment as before,  to f/u any worsening symptoms or concerns

## 2017-08-01 ENCOUNTER — Ambulatory Visit: Payer: Medicare HMO

## 2017-08-05 ENCOUNTER — Other Ambulatory Visit: Payer: Self-pay | Admitting: Internal Medicine

## 2017-08-11 DIAGNOSIS — J9610 Chronic respiratory failure, unspecified whether with hypoxia or hypercapnia: Secondary | ICD-10-CM | POA: Diagnosis not present

## 2017-08-11 DIAGNOSIS — G4733 Obstructive sleep apnea (adult) (pediatric): Secondary | ICD-10-CM | POA: Diagnosis not present

## 2017-08-11 DIAGNOSIS — J8410 Pulmonary fibrosis, unspecified: Secondary | ICD-10-CM | POA: Diagnosis not present

## 2017-08-11 DIAGNOSIS — J9621 Acute and chronic respiratory failure with hypoxia: Secondary | ICD-10-CM | POA: Diagnosis not present

## 2017-08-11 DIAGNOSIS — J449 Chronic obstructive pulmonary disease, unspecified: Secondary | ICD-10-CM | POA: Diagnosis not present

## 2017-08-13 ENCOUNTER — Encounter: Payer: Self-pay | Admitting: Pulmonary Disease

## 2017-08-13 ENCOUNTER — Ambulatory Visit: Payer: Medicare HMO | Admitting: Pulmonary Disease

## 2017-08-13 DIAGNOSIS — J309 Allergic rhinitis, unspecified: Secondary | ICD-10-CM

## 2017-08-13 DIAGNOSIS — B9689 Other specified bacterial agents as the cause of diseases classified elsewhere: Secondary | ICD-10-CM | POA: Diagnosis not present

## 2017-08-13 DIAGNOSIS — J019 Acute sinusitis, unspecified: Secondary | ICD-10-CM | POA: Diagnosis not present

## 2017-08-13 DIAGNOSIS — E559 Vitamin D deficiency, unspecified: Secondary | ICD-10-CM

## 2017-08-13 DIAGNOSIS — W19XXXA Unspecified fall, initial encounter: Secondary | ICD-10-CM | POA: Diagnosis not present

## 2017-08-13 DIAGNOSIS — J9611 Chronic respiratory failure with hypoxia: Secondary | ICD-10-CM | POA: Diagnosis not present

## 2017-08-13 DIAGNOSIS — J849 Interstitial pulmonary disease, unspecified: Secondary | ICD-10-CM | POA: Diagnosis not present

## 2017-08-13 MED ORDER — FLUTICASONE PROPIONATE 50 MCG/ACT NA SUSP: 1.0000 | Freq: Two times a day (BID) | NASAL | 2 refills | Status: DC

## 2017-08-13 MED ORDER — FLUCONAZOLE 100 MG PO TABS: 100.0000 mg | ORAL_TABLET | Freq: Every day | ORAL | 0 refills | Status: AC

## 2017-08-13 MED ORDER — AMOXICILLIN-POT CLAVULANATE 875-125 MG PO TABS: 1.0000 | ORAL_TABLET | Freq: Two times a day (BID) | ORAL | 0 refills | Status: DC

## 2017-08-13 NOTE — Patient Instructions (Addendum)
Saline rinse twice day   Flonase - 1 Spray each nostril twice a day for 7 days  Fall Education provided for patient   Notify Primary Care:  > Bevespi starting >Leg Cramping improving >Recent Fall  >Need for Vitamin D medication  Start Bevespi sample today  > Rinse mouth after use  Antibiotics - Augmentin start today 875-125mg  BID for 7days  Diflucan - 100 mg pill, take one in 48 hours and take second if symptoms persist after antibiotics are finished.   Please contact the office if your symptoms worsen or you have concerns that you are not improving.   Thank you for choosing [facility] for your healthcare, and for allowing [place] to partner with you on your healthcare journey. I am thankful to be able to provide care to you today.   Wyn Quaker FNP-C

## 2017-08-13 NOTE — Progress Notes (Addendum)
@Patient  ID: Dawn Gomez, female    DOB: [birth year], 82 y.o.   MRN: [id number]  Chief Complaint  Patient presents with  . Follow-up    lots of cramping, taking tonic water to help with those, occ chest tightness and wheezing, non-productive cough, on 4L o2 all the time, SOB with exertion , unable to do Anoro gets choked up    Referring provider: Golden Circle, FNP  HPI:  82 year old female patient presenting today for follow-up appointment.  Former smoker.  For 50 years with 100 pack years in total.  Patient on 4 L continuous flow but does well.  Former patient of Dr. Gwenette Greet has pulmonary fibrosis of uncertain etiology and centrilobular emphysema.  Labs 2009 ANA RF ACE ESR all okay Autoimmune 01/2014 equals negative January 2017 ANA RF CCP J0-1 SSA SSB SCL-7 0 anticentromere all negative  Pulmonary function tests most recent: July 2018 pulmonary function test ratio 91%, FVC 1.69 L, 76% predicted, total lung capacity 2.98 L 66% predicted, DLCO 5.18  Imaging: CXR 10/2010:  Stable interstitial changes. CXR 2013:  Stable IS changes CT chest 2014:  Subpleural reticulation, ?early honeycombing, per chest radiology >> no definite GGO, may be UIP or maybe not.  HRCT 01/2014:  Slight progression of ISLD, and pattern suggestive of UIP 03/2015 CT chest , stable fibrosis since 2014  2018 high-resolution CT chest images independently reviewed showing upper lobe predominant centrilobular emphysema with nonspecific patchy fibrotic changes in the bases somewhat in a bronchovascular distribution  Other test Joselyn Arrow on room air with CPAP March 2014 low sat 85% only 7 minutes less than 88% entire night Ambulate oximetry April 2015, desat to 87%.  Patient would like to hold off on portable oxygen  Echo TTE March 2014 normal LV, DD, normal RV, no evidence for pulmonary hypertension  10/29/2016 Saw Dr. Lake Bells started on Anoro.  Educated on oxygen use at home coordinated care with advanced home  care.  Prescribed Oxymizer device to help conserve the amount of oxygen use as needed.  Encouraged to follow-up in 3 months or sooner.  08/13/17 Pleasant 82 year old patient presents today for follow-up appointment.  No recent fall a few days ago.  Patient thinking that recent chest tightness and cramping is from that fall.  Patient also reporting leg cramps thinking that is from her Lasix and her potassium.  Patient has not been taking the Anoro as prescribed at the most recent appointment due to the powder getting the patient "choked up".  She also reporting dry cough. Also reporting left sinus pain for 4 days.       Allergies  Allergen Reactions  . Lipitor [Atorvastatin] Shortness Of Breath, Diarrhea and Other (See Comments)    Dizzy, pain all over.  . Codeine   . Hydromorphone Hcl     REACTION: dementia  . Levaquin [Levofloxacin In D5w]     Shock per pt but can take cipro  . Morphine   . Oxycodone-Acetaminophen   . Propoxyphene N-Acetaminophen   . Simvastatin     Immunization History  Administered Date(s) Administered  . Influenza Whole 02/06/2009, 11/06/2009, 01/29/2012  . Influenza, High Dose Seasonal PF 02/05/2017  . Influenza,inj,Quad PF,6+ Mos 02/08/2013, 01/28/2014  . Influenza-Unspecified 03/09/2015, 12/08/2015  . Pneumococcal Polysaccharide-23 02/06/2009  . Pneumococcal-Unspecified 04/08/2014  . Td 08/06/2012    Past Medical History:  Diagnosis Date  . Abnormal heart rhythm   . Aortic aneurysm ([place])   . Dyspnea   . Hypothyroid   . OSA (  obstructive sleep apnea)    on cpap  . Psoriasis 02/05/2017  . Pulmonary fibrosis ([place])   . SVT (supraventricular tachycardia) ([place])   . Thyroid disease    hypo    Tobacco History: Social History   Tobacco Use  Smoking Status Former Smoker  . Packs/day: 2.00  . Years: 50.00  . Pack years: 100.00  . Types: Cigarettes  . Last attempt to quit: 2008  . Years since quitting: 11.3  Smokeless Tobacco Never Used    Counseling given: Not Answered   Outpatient Encounter Medications as of 08/13/2017  Medication Sig  . acetaminophen (TYLENOL) 500 MG tablet Take 500 mg by mouth every 6 (six) hours as needed.  . Ascorbic Acid (VITAMIN C PO) Take by mouth.  Marland Kitchen aspirin EC 81 MG tablet Take 81 mg by mouth daily.  Marland Kitchen CALCIUM-MAGNESIUM-ZINC PO Take 1 tablet by mouth daily.  . cholecalciferol (VITAMIN D) 1000 units tablet Take 5,000 Units by mouth daily.  Marland Kitchen diltiazem (CARDIZEM CD) 180 MG 24 hr capsule Take 1 capsule (180 mg total) by mouth daily.  Marland Kitchen escitalopram (LEXAPRO) 20 MG tablet Take 1 tablet (20 mg total) at bedtime by mouth.  . fenofibrate (TRICOR) 145 MG tablet Take 145 mg by mouth daily.  . furosemide (LASIX) 40 MG tablet Take 1 tablet (40 mg total) by mouth daily.  Marland Kitchen ipratropium-albuterol (DUONEB) 0.5-2.5 (3) MG/3ML SOLN Take 3 mLs by nebulization every 4 (four) hours as needed.  Marland Kitchen levothyroxine (SYNTHROID, LEVOTHROID) 100 MCG tablet Take 1 tablet (100 mcg) by mouth daily *emergency fill*  . levothyroxine (SYNTHROID, LEVOTHROID) 50 MCG tablet Take 50 mcg by mouth daily before breakfast.  . lisinopril (PRINIVIL,ZESTRIL) 5 MG tablet Take 1 tablet (5 mg total) by mouth daily.  . potassium chloride SA (K-DUR,KLOR-CON) 20 MEQ tablet Take 1 tablet (20 mEq) by mouth daily with food *emergency fill*  . rosuvastatin (CRESTOR) 20 MG tablet Take 1 tablet (20 mg total) by mouth daily.  . traMADol (ULTRAM) 50 MG tablet Take 1 tablet (50 mg total) every 8 (eight) hours as needed by mouth.  . vitamin B-12 (CYANOCOBALAMIN) 1000 MCG tablet Take 500 mcg by mouth daily.  . vitamin C (ASCORBIC ACID) 500 MG tablet Take 500 mg by mouth daily.  . Vitamin D, Ergocalciferol, (DRISDOL) 50000 units CAPS capsule Take 1 capsule (50,000 Units total) by mouth every 7 (seven) days.  . vitamin E 400 UNIT capsule Take 200 Units by mouth daily.  Marland Kitchen amoxicillin-clavulanate (AUGMENTIN) 875-125 MG tablet Take 1 tablet by mouth 2 (two) times  daily.  . fluconazole (DIFLUCAN) 100 MG tablet Take 1 tablet (100 mg total) by mouth daily for 2 days.  . fluticasone (FLONASE) 50 MCG/ACT nasal spray Place 1 spray into both nostrils 2 (two) times daily.  Marland Kitchen umeclidinium-vilanterol (ANORO ELLIPTA) 62.5-25 MCG/INH AEPB Inhale 1 puff into the lungs daily. (Patient not taking: Reported on 08/13/2017)   No facility-administered encounter medications on file as of 08/13/2017.      Review of Systems  Constitutional:   No  weight loss, night sweats,  Fevers, chills, fatigue, or  lassitude.  HEENT:   +headaches occasional sore throat, sneezing, nasal congestion, postnasal drip, sinus pain,,  Difficulty swallowing,  Tooth/dental problems, or ear ache  CV:  + occasional chest pain with exertion and without, No  Orthopnea, PND, swelling in lower extremities, anasarca, dizziness, palpitations, syncope.   GI:  +occasional nausea No heartburn, indigestion, abdominal pain, nausea, vomiting, diarrhea, change in bowel habits, loss  of appetite, bloody stools.   Resp: +sob with exertion, non productive cough No excess mucus, no productive cough,  No coughing up of blood.  No change in color of mucus.  No wheezing.  No chest wall deformity  Skin: no rash or lesions.  GU: no dysuria, change in color of urine, no urgency or frequency.  No flank pain, no hematuria   MS:  + recent fall at nephews house, No joint pain or swelling.  No decreased range of motion.  No back pain.    Physical Exam  BP (!) 96/56 (BP Location: Left Arm, Cuff Size: Normal)   Pulse 68   Ht 5' 1"  (1.549 m)   Wt 179 lb 3.2 oz (81.3 kg)   SpO2 94%   BMI 33.86 kg/m   GEN: A/Ox3; pleasant , NAD, well nourished    HEENT:  [place]/AT,  EACs-clear, TMs-wnl, NOSE-clear, THROAT-clear, no lesions, +postnasal drip with minor exudates noted, +Tender Left maxillary pain on palpation   NECK:  Supple w/ fair ROM; no JVD; normal carotid impulses w/o bruits; no thyromegaly or nodules palpated; no  lymphadenopathy.    RESP  +Left base crackles, otherwise clear no accessory muscle use, no dullness to percussion  CARD:  RRR, no m/r/g, no peripheral edema, pulses intact, no cyanosis or clubbing.  GI:   Soft & nt; nml bowel sounds; no organomegaly or masses detected.   Musco: Warm bil, no deformities or joint swelling noted.   Neuro: alert, no focal deficits noted.    Skin: Warm, clubbing on fingers, no lesions or rashes   Lab Results:  CBC    Component Value Date/Time   WBC 9.3 02/05/2017 1634   RBC 4.02 02/05/2017 1634   HGB 12.9 02/05/2017 1634   HCT 37.7 02/05/2017 1634   PLT 321.0 02/05/2017 1634   MCV 93.8 02/05/2017 1634   MCH 30.0 08/07/2016 0538   MCHC 34.3 02/05/2017 1634   RDW 13.6 02/05/2017 1634   LYMPHSABS 1.9 02/05/2017 1634   MONOABS 0.8 02/05/2017 1634   EOSABS 0.2 02/05/2017 1634   BASOSABS 0.1 02/05/2017 1634    BMET    Component Value Date/Time   NA 135 02/05/2017 1634   K 4.5 02/05/2017 1634   CL 96 02/05/2017 1634   CO2 28 02/05/2017 1634   GLUCOSE 113 (H) 02/05/2017 1634   BUN 29 (H) 02/05/2017 1634   CREATININE 1.12 02/05/2017 1634   CALCIUM 9.7 02/05/2017 1634   GFRNONAA >60 08/07/2016 0538   GFRAA >60 08/07/2016 0538    BNP    Component Value Date/Time   BNP 138.9 (H) 08/05/2016 2228    ProBNP    Component Value Date/Time   PROBNP 23.0 03/24/2015 1307    Imaging: No results found.   Assessment & Plan:   Chronic respiratory failure (HCC) 4 L continuously Augmentin for sinus pain Bevespi started today and sample given Discussed importance of follow-up in notifying office if any sort of respiratory changes occur between now and 28-monthfollow-up appointment   Allergic rhinitis Start Flonase now Saline rinses Educated patient on importance of follow-up  Interstitial lung disease ([place] Continue 4 L of oxygen continuously Follow-up in 2 months with Dr. MLake BellsNotify office if any respiratory changes  occur   Acute bacterial sinusitis Start Augmentin for 7 days Diflucan 2 tablets prescribed due to previous history of yeast infections when taking antibiotics Discussed importance of follow-up and close communication with the office if not improving Flonase for next 7 days  discussed with patient 33-monthfollow-up with Dr. MLake BellsIf not improving please return back to the office or call office to update   Vitamin D deficiency Patient will call primary care today to discuss need for vitamin D refill     BLauraine Rinne NP 08/13/2017

## 2017-08-13 NOTE — Progress Notes (Signed)
Reviewed, discussed, agree 

## 2017-08-13 NOTE — Assessment & Plan Note (Signed)
Start Flonase now Saline rinses Educated patient on importance of follow-up

## 2017-08-13 NOTE — Assessment & Plan Note (Signed)
Continue 4 L of oxygen continuously Follow-up in 2 months with Dr. Lake Bells Notify office if any respiratory changes occur

## 2017-08-13 NOTE — Assessment & Plan Note (Signed)
Start Augmentin for 7 days Diflucan 2 tablets prescribed due to previous history of yeast infections when taking antibiotics Discussed importance of follow-up and close communication with the office if not improving Flonase for next 7 days discussed with patient 1-month follow-up with Dr. Lake Bells If not improving please return back to the office or call office to update

## 2017-08-13 NOTE — Assessment & Plan Note (Signed)
Patient will call primary care today to discuss need for vitamin D refill

## 2017-08-13 NOTE — Assessment & Plan Note (Signed)
4 L continuously Augmentin for sinus pain Bevespi started today and sample given Discussed importance of follow-up in notifying office if any sort of respiratory changes occur between now and 9-month follow-up appointment

## 2017-08-14 DIAGNOSIS — I2781 Cor pulmonale (chronic): Secondary | ICD-10-CM | POA: Diagnosis not present

## 2017-08-14 DIAGNOSIS — R0609 Other forms of dyspnea: Secondary | ICD-10-CM | POA: Diagnosis not present

## 2017-08-18 ENCOUNTER — Telehealth: Payer: Self-pay | Admitting: Internal Medicine

## 2017-08-18 MED ORDER — TRAMADOL HCL 50 MG PO TABS: 50.0000 mg | ORAL_TABLET | Freq: Three times a day (TID) | ORAL | 2 refills | Status: DC | PRN

## 2017-08-18 NOTE — Telephone Encounter (Signed)
Tramadol refilled to local pharmacy  Please let pt know we do not usually refill this to the [facility] that requested this

## 2017-08-20 ENCOUNTER — Ambulatory Visit: Payer: Medicare HMO

## 2017-08-21 DIAGNOSIS — I2510 Atherosclerotic heart disease of native coronary artery without angina pectoris: Secondary | ICD-10-CM | POA: Diagnosis not present

## 2017-08-21 DIAGNOSIS — I2584 Coronary atherosclerosis due to calcified coronary lesion: Secondary | ICD-10-CM | POA: Diagnosis not present

## 2017-08-21 DIAGNOSIS — R0602 Shortness of breath: Secondary | ICD-10-CM | POA: Diagnosis not present

## 2017-08-21 DIAGNOSIS — Z8679 Personal history of other diseases of the circulatory system: Secondary | ICD-10-CM | POA: Diagnosis not present

## 2017-08-25 ENCOUNTER — Other Ambulatory Visit: Payer: Self-pay | Admitting: Pulmonary Disease

## 2017-08-25 ENCOUNTER — Ambulatory Visit: Payer: Medicare HMO

## 2017-08-25 MED ORDER — FLUTICASONE PROPIONATE 50 MCG/ACT NA SUSP: 1.0000 | Freq: Two times a day (BID) | NASAL | 2 refills | Status: DC

## 2017-08-25 NOTE — Progress Notes (Signed)
Received fax for patient to refill Flonase prescription. Per Carl Best, NP last OV note patient was to do medication for 7 days. He states that this is ok to refill. Refill sent to pharmacy.

## 2017-08-26 ENCOUNTER — Other Ambulatory Visit: Payer: Self-pay | Admitting: Internal Medicine

## 2017-08-26 ENCOUNTER — Ambulatory Visit
Admission: RE | Admit: 2017-08-26 | Discharge: 2017-08-26 | Disposition: A | Discharge status: Home / Self Care | Payer: Medicare HMO | Source: Ambulatory Visit | Attending: Internal Medicine | Admitting: Internal Medicine

## 2017-08-26 ENCOUNTER — Ambulatory Visit: Payer: Medicare HMO

## 2017-08-26 DIAGNOSIS — Z1231 Encounter for screening mammogram for malignant neoplasm of breast: Secondary | ICD-10-CM | POA: Diagnosis not present

## 2017-08-26 NOTE — Telephone Encounter (Signed)
Copied from [place] [id number]. Topic: Quick Communication - Rx Refill/Question >> Aug 26, 2017  6:38 PM Tye [place] wrote: Medication: escitalopram (LEXAPRO) 20 MG tablet [[id number]] , furosemide (LASIX) 40 MG tablet [[id number]], potassium chloride SA (K-DUR,KLOR-CON) 20 MEQ tablet [[id number], levothyroxine (SYNTHROID, LEVOTHROID) 100 MCG tablet [[id number]], lisinopril (PRINIVIL,ZESTRIL) 5 MG tablet [[id number]] , traMADol (ULTRAM) 50 MG tablet [[id number]]  Has the patient contacted their pharmacy? Yes.   (Agent: If no, request that the patient contact the pharmacy for the refill.) (Agent: If yes, when and what did the pharmacy advise?)  Preferred Pharmacy (with phone number or street name): [facility]  Agent: Please be advised that RX refills may take up to 3 business days. We ask that you follow-up with your pharmacy.

## 2017-08-27 ENCOUNTER — Telehealth: Payer: Self-pay | Admitting: Internal Medicine

## 2017-08-27 MED ORDER — FUROSEMIDE 40 MG PO TABS: 40.0000 mg | ORAL_TABLET | Freq: Every day | ORAL | 1 refills | Status: DC

## 2017-08-27 MED ORDER — LISINOPRIL 5 MG PO TABS: 5.0000 mg | ORAL_TABLET | Freq: Every day | ORAL | 1 refills | Status: DC

## 2017-08-27 MED ORDER — ESCITALOPRAM OXALATE 20 MG PO TABS: 20.0000 mg | ORAL_TABLET | Freq: Every day | ORAL | 3 refills | Status: DC

## 2017-08-27 MED ORDER — LEVOTHYROXINE SODIUM 100 MCG PO TABS: 100.0000 ug | ORAL_TABLET | Freq: Every day | ORAL | 1 refills | Status: DC

## 2017-08-27 NOTE — Telephone Encounter (Signed)
See  Medication list for refills .  Routing to office for consideration

## 2017-08-27 NOTE — Telephone Encounter (Signed)
Reviewed chart pt is up-to-date sent maintanence refills to to [facility]. pls advise on Tramadol.Marland KitchenJohny Chess

## 2017-08-27 NOTE — Telephone Encounter (Signed)
I called her to find out which [facility] she wants meds transferred to.     I let her know to have [facility] [address] Dr[address] Minette Brine [facility] and have her prescriptions transferred to the [facility].  She was not home and is going to call us back later today with the name of the [facility] she wants on file.

## 2017-08-27 NOTE — Telephone Encounter (Signed)
Tramadol too soon, just done Aug 18 2017

## 2017-09-02 NOTE — Progress Notes (Signed)
Note received from [facility] with echo from 08/14/2017:  LVEF 55%, mild LVH, grade 1 DD, mild MR, PA pressure estimate 62mm Hg. Septal flattening may suggest pulmonary hypertension

## 2017-09-04 ENCOUNTER — Ambulatory Visit: Payer: Medicare HMO | Admitting: Internal Medicine

## 2017-09-11 DIAGNOSIS — J9610 Chronic respiratory failure, unspecified whether with hypoxia or hypercapnia: Secondary | ICD-10-CM | POA: Diagnosis not present

## 2017-09-11 DIAGNOSIS — G4733 Obstructive sleep apnea (adult) (pediatric): Secondary | ICD-10-CM | POA: Diagnosis not present

## 2017-09-11 DIAGNOSIS — J449 Chronic obstructive pulmonary disease, unspecified: Secondary | ICD-10-CM | POA: Diagnosis not present

## 2017-09-11 DIAGNOSIS — J8410 Pulmonary fibrosis, unspecified: Secondary | ICD-10-CM | POA: Diagnosis not present

## 2017-09-11 DIAGNOSIS — J9621 Acute and chronic respiratory failure with hypoxia: Secondary | ICD-10-CM | POA: Diagnosis not present

## 2017-09-22 ENCOUNTER — Other Ambulatory Visit: Payer: Self-pay | Admitting: Internal Medicine

## 2017-09-22 NOTE — Telephone Encounter (Signed)
Copied from [place] [id number]. Topic: Quick Communication - Rx Refill/Question >> Sep 22, 2017 10:27 AM Oliver Pila B wrote: Medication: traMADol (ULTRAM) 50 MG tablet [[id number]]   Has the patient contacted their pharmacy? Yes.   (Agent: If no, request that the patient contact the pharmacy for the refill.) (Agent: If yes, when and what did the pharmacy advise?)  Preferred Pharmacy (with phone number [facility] street [facility]  Agent: Please be advised that RX refills may take up to 3 business days. We ask that you follow-up with your pharmacy.

## 2017-09-22 NOTE — Telephone Encounter (Signed)
Tramadol refill. [facility] [address]. Pt requesting that prescription be sent to [facility] Last Refill:08/18/17 #60 with 2 refills Last OV: 07/08/17 PCP: Dr. Jenny Reichmann Pharmacy:[facility]

## 2017-09-23 ENCOUNTER — Ambulatory Visit: Payer: Medicare HMO | Admitting: Internal Medicine

## 2017-09-26 ENCOUNTER — Ambulatory Visit (INDEPENDENT_AMBULATORY_CARE_PROVIDER_SITE_OTHER): Payer: Medicare HMO | Admitting: Internal Medicine

## 2017-09-26 ENCOUNTER — Encounter: Payer: Self-pay | Admitting: Internal Medicine

## 2017-09-26 ENCOUNTER — Telehealth: Payer: Self-pay | Admitting: Pulmonary Disease

## 2017-09-26 VITALS — BP 118/76 | HR 84 | Temp 97.9°F | Ht 61.0 in | Wt 177.0 lb

## 2017-09-26 DIAGNOSIS — R739 Hyperglycemia, unspecified: Secondary | ICD-10-CM | POA: Diagnosis not present

## 2017-09-26 DIAGNOSIS — Z0000 Encounter for general adult medical examination without abnormal findings: Secondary | ICD-10-CM | POA: Diagnosis not present

## 2017-09-26 DIAGNOSIS — Z23 Encounter for immunization: Secondary | ICD-10-CM

## 2017-09-26 DIAGNOSIS — E559 Vitamin D deficiency, unspecified: Secondary | ICD-10-CM | POA: Diagnosis not present

## 2017-09-26 MED ORDER — TRAMADOL HCL 50 MG PO TABS: 50.0000 mg | ORAL_TABLET | Freq: Two times a day (BID) | ORAL | 1 refills | Status: DC | PRN

## 2017-09-26 MED ORDER — TRAZODONE HCL 50 MG PO TABS: 50.0000 mg | ORAL_TABLET | Freq: Every evening | ORAL | 1 refills | Status: DC | PRN

## 2017-09-26 NOTE — Assessment & Plan Note (Signed)
Also for f/u lab

## 2017-09-26 NOTE — Assessment & Plan Note (Signed)

## 2017-09-26 NOTE — Assessment & Plan Note (Signed)
Also for f/u a1c

## 2017-09-26 NOTE — Patient Instructions (Addendum)
You had the Pneumovax pneumonia shot today  Please take all new medication as prescribed - the trazodone for sleep  Please continue all other medications as before, and refills have been done if requested.  Please have the pharmacy call with any other refills you may need.  Please continue your efforts at being more active, low cholesterol diet, and weight control.  You are otherwise up to date with prevention measures today.  Please keep your appointments with your specialists as you may have planned  Please go to the LAB in the Basement (turn left off the elevator) for the tests to be done today  You will be contacted by phone if any changes need to be made immediately.  Otherwise, you will receive a letter about your results with an explanation, but please check with MyChart first.  Please remember to sign up for MyChart if you have not done so, as this will be important to you in the future with finding out test results, communicating by private email, and scheduling acute appointments online when needed.  Please return in 6 months, or sooner if needed

## 2017-09-26 NOTE — Telephone Encounter (Signed)
Called and spoke to pt.  Pt stated that Charolotte Eke is too expensive for her. I have advised pt to contact insurance to obtain medication formulary.  Pt stated she will call back once medication formulary or covered alternatives are obtained.  Will await call back.

## 2017-09-26 NOTE — Progress Notes (Signed)
Subjective:    Patient ID: Dawn Gomez, female    DOB: 1936-01-30, 82 y.o.   MRN: [id number]  HPI Here for wellness and f/u;  Overall doing ok;  Pt denies Chest pain, worsening SOB, DOE, wheezing, orthopnea, PND, worsening LE edema, palpitations, dizziness or syncope.  Pt denies neurological change such as new headache, facial or extremity weakness.  Pt denies polydipsia, polyuria, or low sugar symptoms. Pt states overall good compliance with treatment and medications, good tolerability, and has been trying to follow appropriate diet.  Pt denies worsening depressive symptoms, suicidal ideation or panic. No fever, night sweats, wt loss, loss of appetite, or other constitutional symptoms.  Pt states good ability with ADL's, has low fall risk, home safety reviewed and adequate, no other significant changes in hearing or vision, and not active with exercise.  To see derm soon for RLE lesion.  Had recent echo with normal EF.  Cant seem to get to sleep most nights well despite current meds.  No other complaints or interval hx Past Medical History:  Diagnosis Date  . Abnormal heart rhythm   . Aortic aneurysm ([place])   . Dyspnea   . Hypothyroid   . OSA (obstructive sleep apnea)    on cpap  . Psoriasis 02/05/2017  . Pulmonary fibrosis ([place])   . SVT (supraventricular tachycardia) ([place])   . Thyroid disease    hypo   Past Surgical History:  Procedure Laterality Date  . ABDOMINAL HYSTERECTOMY    . APPENDECTOMY    . CHOLECYSTECTOMY    . CRANIOTOMY     for aneurysms  . CRANIOTOMY  1980 x 2   aneurysmal clipping  . HERNIA REPAIR    . TOTAL ABDOMINAL HYSTERECTOMY    . TUBAL LIGATION      reports that she quit smoking about 11 years ago. Her smoking use included cigarettes. She has a 100.00 pack-year smoking history. She has never used smokeless tobacco. She reports that she drinks alcohol. She reports that she does not use drugs. family history includes Allergies in her sister; Bone cancer in her  father; Breast cancer in her sister; Cancer in her sister; Clotting disorder in her father and sister; Emphysema in her father, mother, and sister; Heart disease in her father and mother; Lung cancer in her father and mother; Prostate cancer in her father; Rheum arthritis in her father, mother, and sister. Allergies  Allergen Reactions  . Lipitor [Atorvastatin] Shortness Of Breath, Diarrhea and Other (See Comments)    Dizzy, pain all over.  . Codeine   . Hydromorphone Hcl     REACTION: dementia  . Levaquin [Levofloxacin In D5w]     Shock per pt but can take cipro  . Morphine   . Oxycodone-Acetaminophen   . Propoxyphene N-Acetaminophen   . Simvastatin    Current Outpatient Medications on File Prior to Visit  Medication Sig Dispense Refill  . acetaminophen (TYLENOL) 500 MG tablet Take 500 mg by mouth every 6 (six) hours as needed.    . Ascorbic Acid (VITAMIN C PO) Take by mouth.    Marland Kitchen aspirin EC 81 MG tablet Take 81 mg by mouth daily.    Marland Kitchen CALCIUM-MAGNESIUM-ZINC PO Take 1 tablet by mouth daily.    . cholecalciferol (VITAMIN D) 1000 units tablet Take 5,000 Units by mouth daily.    Marland Kitchen diltiazem (CARDIZEM CD) 180 MG 24 hr capsule Take 1 capsule (180 mg total) by mouth daily. 90 capsule 1  . escitalopram (LEXAPRO) 20  MG tablet Take 1 tablet (20 mg total) by mouth at bedtime. 90 tablet 3  . fenofibrate (TRICOR) 145 MG tablet Take 145 mg by mouth daily.    . fluticasone (FLONASE) 50 MCG/ACT nasal spray Place 1 spray into both nostrils 2 (two) times daily. 16 g 2  . furosemide (LASIX) 40 MG tablet Take 1 tablet (40 mg total) by mouth daily. 90 tablet 1  . ipratropium-albuterol (DUONEB) 0.5-2.5 (3) MG/3ML SOLN Take 3 mLs by nebulization every 4 (four) hours as needed. 360 mL 0  . levothyroxine (SYNTHROID, LEVOTHROID) 100 MCG tablet Take 1 tablet (100 mcg total) by mouth daily before breakfast. 90 tablet 1  . levothyroxine (SYNTHROID, LEVOTHROID) 50 MCG tablet Take 50 mcg by mouth daily before  breakfast.    . lisinopril (PRINIVIL,ZESTRIL) 5 MG tablet Take 1 tablet (5 mg total) by mouth daily. 90 tablet 1  . potassium chloride SA (K-DUR,KLOR-CON) 20 MEQ tablet Take 1 tablet (20 mEq) by mouth daily with food *emergency fill* 30 tablet 5  . rosuvastatin (CRESTOR) 20 MG tablet Take 1 tablet (20 mg total) by mouth daily. 90 tablet 3  . traMADol (ULTRAM) 50 MG tablet Take 1 tablet (50 mg total) by mouth every 8 (eight) hours as needed. 60 tablet 2  . umeclidinium-vilanterol (ANORO ELLIPTA) 62.5-25 MCG/INH AEPB Inhale 1 puff into the lungs daily. 2 each 0  . vitamin B-12 (CYANOCOBALAMIN) 1000 MCG tablet Take 500 mcg by mouth daily.    . vitamin C (ASCORBIC ACID) 500 MG tablet Take 500 mg by mouth daily.    . Vitamin D, Ergocalciferol, (DRISDOL) 50000 units CAPS capsule Take 1 capsule (50,000 Units total) by mouth every 7 (seven) days. 12 capsule 0  . vitamin E 400 UNIT capsule Take 200 Units by mouth daily.     No current facility-administered medications on file prior to visit.    Review of Systems Constitutional: Negative for other unusual diaphoresis, sweats, appetite or weight changes HENT: Negative for other worsening hearing loss, ear pain, facial swelling, mouth sores or neck stiffness.   Eyes: Negative for other worsening pain, redness or other visual disturbance.  Respiratory: Negative for other stridor or swelling Cardiovascular: Negative for other palpitations or other chest pain  Gastrointestinal: Negative for worsening diarrhea or loose stools, blood in stool, distention or other pain Genitourinary: Negative for hematuria, flank pain or other change in urine volume.  Musculoskeletal: Negative for myalgias or other joint swelling.  Skin: Negative for other color change, or other wound or worsening drainage.  Neurological: Negative for other syncope or numbness. Hematological: Negative for other adenopathy or swelling Psychiatric/Behavioral: Negative for hallucinations, other  worsening agitation, SI, self-injury, or new decreased concentration All other system neg per pt    Objective:   Physical Exam BP 118/76   Pulse 84   Temp 97.9 F (36.6 C) (Oral)   Ht 5\' 1"  (1.549 m)   Wt 177 lb (80.3 kg)   SpO2 99%   BMI 33.44 kg/m  on Home o2 4L [place] VS noted,  Constitutional: Pt is oriented to person, place, and time. Appears well-developed and well-nourished, in no significant distress and comfortable Head: Normocephalic and atraumatic  Eyes: Conjunctivae and EOM are normal. Pupils are equal, round, and reactive to light Right Ear: External ear normal without discharge Left Ear: External ear normal without discharge Nose: Nose without discharge or deformity Mouth/Throat: Oropharynx is without other ulcerations and moist  Neck: Normal range of motion. Neck supple. No JVD present.  No tracheal deviation present or significant neck LA or mass Cardiovascular: Normal rate, regular rhythm, normal heart sounds and intact distal pulses.  Pulmonary/Chest: WOB normal and breath sounds decreased without rales or wheezing  Abdominal: Soft. Bowel sounds are normal. NT. No HSM  Musculoskeletal: Normal range of motion. Exhibits no edema Lymphadenopathy: Has no other cervical adenopathy.  Neurological: Pt is alert and oriented to person, place, and time. Pt has normal reflexes. No cranial nerve deficit. Motor grossly intact, Gait intact Skin: Skin is warm and dry. No rash noted or new ulcerations Psychiatric:  Has normal mood and affect. Behavior is normal without agitation No other exam findings Lab Results  Component Value Date   WBC 9.3 02/05/2017   HGB 12.9 02/05/2017   HCT 37.7 02/05/2017   PLT 321.0 02/05/2017   GLUCOSE 113 (H) 02/05/2017   CHOL 206 (H) 02/05/2017   TRIG 205.0 (H) 02/05/2017   HDL 46.70 02/05/2017   LDLDIRECT 138.0 02/05/2017   ALT 19 02/05/2017   AST 18 02/05/2017   NA 135 02/05/2017   K 4.5 02/05/2017   CL 96 02/05/2017   CREATININE 1.12  02/05/2017   BUN 29 (H) 02/05/2017   CO2 28 02/05/2017   TSH 1.26 02/05/2017   HGBA1C 5.7 02/05/2017       Assessment & Plan:

## 2017-09-30 NOTE — Telephone Encounter (Signed)
Attempted to call pt. I did not receive an answer. I have left a message for pt to return our call.  

## 2017-10-01 NOTE — Telephone Encounter (Signed)
Attempted to call pt. I did not receive an answer. I have left a message for pt to return our call.  

## 2017-10-02 NOTE — Telephone Encounter (Signed)
Spoke with pt. States that she spoke with her insurance company. But they didn't give her the information that she needed. Pt is going to contact them again and get a copy of her drug formulary. She will bring this by here once she receives it.

## 2017-10-03 ENCOUNTER — Telehealth: Payer: Self-pay | Admitting: Internal Medicine

## 2017-10-03 NOTE — Telephone Encounter (Signed)
Copied from [place] [id number]. Topic: Quick Communication - See Telephone Encounter >> Oct 03, 2017  8:49 AM Marja Kays F wrote: Pt is wanting to know if she is able to take trazodone and escitalopram  together   Best number [phone]

## 2017-10-03 NOTE — Telephone Encounter (Signed)
Patient call returned about taking Trazodone and Lexapro together. I advised that according to literature, they should not be taken together. I advised her to call her pharmacist to get his/her advice on how to take these two medications. I also advised I will send this to Dr. Jenny Reichmann and someone will call with his recommendation, she verbalized understanding.

## 2017-10-04 NOTE — Telephone Encounter (Signed)
Dr John please advise.  

## 2017-10-04 NOTE — Telephone Encounter (Signed)
Ok to cont current meds, as this combination of medications is extremely commonly well tolerated and taken together

## 2017-10-06 NOTE — Telephone Encounter (Signed)
Pt has been informed and expressed understanding.  

## 2017-10-11 DIAGNOSIS — J9610 Chronic respiratory failure, unspecified whether with hypoxia or hypercapnia: Secondary | ICD-10-CM | POA: Diagnosis not present

## 2017-10-11 DIAGNOSIS — J449 Chronic obstructive pulmonary disease, unspecified: Secondary | ICD-10-CM | POA: Diagnosis not present

## 2017-10-11 DIAGNOSIS — J9621 Acute and chronic respiratory failure with hypoxia: Secondary | ICD-10-CM | POA: Diagnosis not present

## 2017-10-11 DIAGNOSIS — G4733 Obstructive sleep apnea (adult) (pediatric): Secondary | ICD-10-CM | POA: Diagnosis not present

## 2017-10-11 DIAGNOSIS — J8410 Pulmonary fibrosis, unspecified: Secondary | ICD-10-CM | POA: Diagnosis not present

## 2017-10-27 ENCOUNTER — Other Ambulatory Visit: Payer: Self-pay | Admitting: Internal Medicine

## 2017-10-28 ENCOUNTER — Telehealth: Payer: Self-pay | Admitting: Internal Medicine

## 2017-10-28 NOTE — Telephone Encounter (Unsigned)
Copied from [place] [id number]. Topic: Quick Communication - Rx Refill/Question >> Oct 28, 2017  4:02 PM Judyann Munson wrote: Medication: traMADol (ULTRAM) 50 MG tablet, fenofibrate (TRICOR) 145 MG tablet  Has the patient contacted their pharmacy?no  Preferred Pharmacy (with phone number or street name): [place], [place] [phone] (Phone) [phone] (Fax)

## 2017-10-29 NOTE — Telephone Encounter (Signed)
Tramadol refill too soon, since the last rx for total 6 months was just done in late June 2019

## 2017-11-07 ENCOUNTER — Other Ambulatory Visit: Payer: Self-pay | Admitting: Internal Medicine

## 2017-11-07 DIAGNOSIS — L820 Inflamed seborrheic keratosis: Secondary | ICD-10-CM | POA: Diagnosis not present

## 2017-11-07 DIAGNOSIS — D2221 Melanocytic nevi of right ear and external auricular canal: Secondary | ICD-10-CM | POA: Diagnosis not present

## 2017-11-07 DIAGNOSIS — L218 Other seborrheic dermatitis: Secondary | ICD-10-CM | POA: Diagnosis not present

## 2017-11-07 DIAGNOSIS — D2372 Other benign neoplasm of skin of left lower limb, including hip: Secondary | ICD-10-CM | POA: Diagnosis not present

## 2017-11-07 DIAGNOSIS — L821 Other seborrheic keratosis: Secondary | ICD-10-CM | POA: Diagnosis not present

## 2017-11-07 DIAGNOSIS — D485 Neoplasm of uncertain behavior of skin: Secondary | ICD-10-CM | POA: Diagnosis not present

## 2017-11-07 MED ORDER — ESCITALOPRAM OXALATE 20 MG PO TABS: 20.0000 mg | ORAL_TABLET | Freq: Every day | ORAL | 2 refills | Status: DC

## 2017-11-07 NOTE — Telephone Encounter (Signed)
Tramadol 50mg  refill.  Last Refill:09/26/17 #180 with 1 refill sent to [place]: 09/26/17 PCP: Dr. Jenny Reichmann Pharmacy:[facility] [address]  Refill for Lexapro previously sent to [facility] sent to [facility] on [address] as requested by the pt.

## 2017-11-07 NOTE — Telephone Encounter (Signed)
Copied from [place] [phone] [phone]. Topic: Inquiry >> Nov 07, 2017  3:39 PM Pricilla Handler wrote: Reason for CRM: Patient called requesting refills of Escitalopram (LEXAPRO) 20 MG tablet and TraMADol (ULTRAM) 50 MG tablet to be sent to [place], [place] [phone] (Phone)  [phone] (Fax). Patient wants a refill of Potassium chloride SA (K-DUR,KLOR-CON) 20 MEQ tablet sent to [place], [place] [phone] (Phone)  [phone] (Fax).       Thank You!!!

## 2017-11-10 ENCOUNTER — Telehealth: Payer: Self-pay

## 2017-11-10 MED ORDER — POTASSIUM CHLORIDE CRYS ER 20 MEQ PO TBCR: EXTENDED_RELEASE_TABLET | ORAL | 3 refills | Status: DC

## 2017-11-10 MED ORDER — TRAMADOL HCL 50 MG PO TABS: 50.0000 mg | ORAL_TABLET | Freq: Two times a day (BID) | ORAL | 1 refills | Status: DC | PRN

## 2017-11-10 MED ORDER — ESCITALOPRAM OXALATE 20 MG PO TABS: 20.0000 mg | ORAL_TABLET | Freq: Every day | ORAL | 3 refills | Status: DC

## 2017-11-10 NOTE — Telephone Encounter (Signed)
potassium chloride SA (K-DUR,KLOR-CON) 20 MEQ tablet needs to be sent to  [place], [place] - [address] [phone] (Phone) [phone] (Fax)

## 2017-11-10 NOTE — Telephone Encounter (Signed)
All 3 done erx as reqeusted

## 2017-11-10 NOTE — Addendum Note (Signed)
Addended by: Biagio Borg on: 11/10/2017 12:36 PM   Modules accepted: Orders

## 2017-11-10 NOTE — Telephone Encounter (Signed)
Copied from [place] [phone]. Topic: Inquiry >> Nov 07, 2017  3:39 PM Pricilla Handler wrote: Reason for CRM: Patient called requesting refills of Escitalopram (LEXAPRO) 20 MG tablet and TraMADol (ULTRAM) 50 MG tablet to be sent to [place], [place] [phone] (Phone)  [phone] (Fax). Patient wants a refill of Potassium chloride SA (K-DUR,KLOR-CON) 20 MEQ tablet sent to [place], [place] [phone] (Phone)  [phone] (Fax).       Thank You!!!

## 2017-11-10 NOTE — Addendum Note (Signed)
Addended by: Juliet Rude on: 11/10/2017 01:55 PM   Modules accepted: Orders

## 2017-11-11 DIAGNOSIS — G4733 Obstructive sleep apnea (adult) (pediatric): Secondary | ICD-10-CM | POA: Diagnosis not present

## 2017-11-11 DIAGNOSIS — J8410 Pulmonary fibrosis, unspecified: Secondary | ICD-10-CM | POA: Diagnosis not present

## 2017-11-11 DIAGNOSIS — J9621 Acute and chronic respiratory failure with hypoxia: Secondary | ICD-10-CM | POA: Diagnosis not present

## 2017-11-11 DIAGNOSIS — J9610 Chronic respiratory failure, unspecified whether with hypoxia or hypercapnia: Secondary | ICD-10-CM | POA: Diagnosis not present

## 2017-11-11 DIAGNOSIS — J449 Chronic obstructive pulmonary disease, unspecified: Secondary | ICD-10-CM | POA: Diagnosis not present

## 2017-11-11 NOTE — Telephone Encounter (Signed)
MD [facility] to [facility] on 11/10/17.Marland KitchenJohny Chess

## 2017-11-14 ENCOUNTER — Other Ambulatory Visit: Payer: Self-pay | Admitting: Internal Medicine

## 2017-11-18 ENCOUNTER — Encounter: Payer: Self-pay | Admitting: Family

## 2017-11-18 ENCOUNTER — Ambulatory Visit (INDEPENDENT_AMBULATORY_CARE_PROVIDER_SITE_OTHER): Payer: Medicare HMO | Admitting: Family

## 2017-11-18 VITALS — BP 110/76 | HR 69 | Temp 97.9°F | Ht 61.0 in | Wt 178.0 lb

## 2017-11-18 DIAGNOSIS — M542 Cervicalgia: Secondary | ICD-10-CM

## 2017-11-18 DIAGNOSIS — J019 Acute sinusitis, unspecified: Secondary | ICD-10-CM | POA: Diagnosis not present

## 2017-11-18 MED ORDER — MELOXICAM 7.5 MG PO TABS: 7.5000 mg | ORAL_TABLET | Freq: Every day | ORAL | 0 refills | Status: DC

## 2017-11-18 MED ORDER — CEFDINIR 300 MG PO CAPS: 300.0000 mg | ORAL_CAPSULE | Freq: Two times a day (BID) | ORAL | 0 refills | Status: DC

## 2017-11-18 NOTE — Progress Notes (Signed)
Dawn Gomez is a 82 y.o. female with the following history as recorded in EpicCare:  Patient Active Problem List   Diagnosis Date Noted  . Fall 08/13/2017    Class: History of  . Night sweats 07/13/2017  . Dysuria 07/08/2017  . Psoriasis 02/05/2017  . Preventative health care 02/05/2017  . Chest pain 02/05/2017  . Back pain 02/05/2017  . Pulmonary fibrosis ([place]) 08/06/2016  . Acute on chronic respiratory failure with hypoxia ([place]) 08/06/2016  . Hypothyroidism 08/06/2016  . Hyperglycemia 08/06/2016  . Allergic rhinitis 07/16/2016  . Chest wall mass 07/16/2016  . Hyperlipidemia 07/12/2016  . SVT (supraventricular tachycardia) ([place]) 07/12/2016  . Vitamin D deficiency 07/12/2016  . Depression 07/12/2016  . Chronic respiratory failure ([place]) 03/24/2015  . Interstitial lung disease ([place]) 08/21/2007    Current Outpatient Medications  Medication Sig Dispense Refill  . acetaminophen (TYLENOL) 500 MG tablet Take 500 mg by mouth every 6 (six) hours as needed.    . Ascorbic Acid (VITAMIN C PO) Take by mouth.    Marland Kitchen aspirin EC 81 MG tablet Take 81 mg by mouth daily.    Marland Kitchen CALCIUM-MAGNESIUM-ZINC PO Take 1 tablet by mouth daily.    . cholecalciferol (VITAMIN D) 1000 units tablet Take 5,000 Units by mouth daily.    Marland Kitchen diltiazem (CARDIZEM CD) 180 MG 24 hr capsule Take 1 capsule (180 mg total) by mouth daily. 90 capsule 1  . escitalopram (LEXAPRO) 20 MG tablet Take 1 tablet (20 mg total) by mouth at bedtime. 90 tablet 3  . fenofibrate (TRICOR) 145 MG tablet Take 145 mg by mouth daily.    . fluocinonide (LIDEX) 0.05 % external solution APP EXT AA OF SCALP NIGHTLY  3  . fluticasone (FLONASE) 50 MCG/ACT nasal spray Place 1 spray into both nostrils 2 (two) times daily. 16 g 2  . furosemide (LASIX) 40 MG tablet TAKE 1 TABLET (40 MG TOTAL) BY MOUTH DAILY. 90 tablet 1  . ipratropium-albuterol (DUONEB) 0.5-2.5 (3) MG/3ML SOLN Take 3 mLs by nebulization every 4 (four) hours as needed. 360 mL 0  .  levothyroxine (SYNTHROID, LEVOTHROID) 100 MCG tablet Take 1 tablet (100 mcg total) by mouth daily before breakfast. 90 tablet 1  . levothyroxine (SYNTHROID, LEVOTHROID) 50 MCG tablet Take 50 mcg by mouth daily before breakfast.    . lisinopril (PRINIVIL,ZESTRIL) 5 MG tablet TAKE 1 TABLET (5 MG TOTAL) BY MOUTH DAILY. 90 tablet 2  . potassium chloride SA (K-DUR,KLOR-CON) 20 MEQ tablet Take 1 tablet (20 mEq) by mouth daily with food 90 tablet 3  . rosuvastatin (CRESTOR) 20 MG tablet Take 1 tablet (20 mg total) by mouth daily. 90 tablet 3  . traMADol (ULTRAM) 50 MG tablet Take 1 tablet (50 mg total) by mouth every 12 (twelve) hours as needed. 180 tablet 1  . traZODone (DESYREL) 50 MG tablet Take 1 tablet (50 mg total) by mouth at bedtime as needed for sleep. 90 tablet 1  . umeclidinium-vilanterol (ANORO ELLIPTA) 62.5-25 MCG/INH AEPB Inhale 1 puff into the lungs daily. 2 each 0  . vitamin B-12 (CYANOCOBALAMIN) 1000 MCG tablet Take 500 mcg by mouth daily.    . vitamin C (ASCORBIC ACID) 500 MG tablet Take 500 mg by mouth daily.    . Vitamin D, Ergocalciferol, (DRISDOL) 50000 units CAPS capsule Take 1 capsule (50,000 Units total) by mouth every 7 (seven) days. 12 capsule 0  . vitamin E 400 UNIT capsule Take 200 Units by mouth daily.    . cefdinir (  OMNICEF) 300 MG capsule Take 1 capsule (300 mg total) by mouth 2 (two) times daily. 20 capsule 0  . meloxicam (MOBIC) 7.5 MG tablet Take 1 tablet (7.5 mg total) by mouth daily. 30 tablet 0   No current facility-administered medications for this visit.     Allergies: Lipitor [atorvastatin]; Codeine; Hydromorphone hcl; Levaquin [levofloxacin in d5w]; Morphine; Oxycodone-acetaminophen; Propoxyphene n-acetaminophen; and Simvastatin  Past Medical History:  Diagnosis Date  . Abnormal heart rhythm   . Aortic aneurysm ([place])   . Dyspnea   . Hypothyroid   . OSA (obstructive sleep apnea)    on cpap  . Psoriasis 02/05/2017  . Pulmonary fibrosis ([place])   . SVT  (supraventricular tachycardia) ([place])   . Thyroid disease    hypo    Past Surgical History:  Procedure Laterality Date  . ABDOMINAL HYSTERECTOMY    . APPENDECTOMY    . CHOLECYSTECTOMY    . CRANIOTOMY     for aneurysms  . CRANIOTOMY  1980 x 2   aneurysmal clipping  . HERNIA REPAIR    . TOTAL ABDOMINAL HYSTERECTOMY    . TUBAL LIGATION      Family History  Problem Relation Age of Onset  . Emphysema Father   . Heart disease Father   . Clotting disorder Father   . Rheum arthritis Father   . Lung cancer Father   . Prostate cancer Father   . Bone cancer Father   . Emphysema Sister   . Emphysema Mother   . Heart disease Mother   . Rheum arthritis Mother   . Lung cancer Mother   . Allergies Sister   . Clotting disorder Sister   . Rheum arthritis Sister   . Breast cancer Sister   . Cancer Sister        ureter    Social History   Tobacco Use  . Smoking status: Former Smoker    Packs/day: 2.00    Years: 50.00    Pack years: 100.00    Types: Cigarettes    Last attempt to quit: 2008    Years since quitting: 11.6  . Smokeless tobacco: Never Used  Substance Use Topics  . Alcohol use: Yes    Comment: "drank back in her younger wilder days"    Subjective:  Patient presents with concerns for sinus infection; symptoms x 3-4 weeks; + post-nasal drainage; has recurrent sinus infections; using Flonase with no benefit;  Also complaining of pulled muscle in the back of her neck/ upper right shoulder x 2 weeks; symptoms started after lifting her oxygen tanks; no numbness or tingling into fingertips;   Objective:  Vitals:   11/18/17 1347  BP: 110/76  Pulse: 69  Temp: 97.9 F (36.6 C)  TempSrc: Oral  SpO2: 93%  Weight: 178 lb (80.7 kg)  Height: 5\' 1"  (1.549 m)    General: Well developed, well nourished, in no acute distress; Skin : Warm and dry.  Head: Normocephalic and atraumatic  Eyes: Sclera and conjunctiva clear; pupils round and reactive to light; extraocular movements  intact  Ears: External normal; canals clear; tympanic membranes normal  Oropharynx: Pink, supple. No suspicious lesions  Neck: Supple without thyromegaly, adenopathy  Lungs: Respirations unlabored; occasional wheeze noted in lower lobes; on 4L oxygen Musculoskeletal: No deformities; no active joint inflammation  Extremities: No edema, cyanosis, clubbing  Vessels: Symmetric bilaterally  Neurologic: Alert and oriented; speech intact; face symmetrical; moves all extremities well; CNII-XII intact without focal deficit   Assessment:  1. Acute  sinusitis, recurrence not specified, unspecified location   2. Acute neck pain     Plan:  1. Rx for Omnicef 300 mg bid x 10 days; continue Flonase; 2. Rx for Mobic 7.5 mg qd; follow-up worse, no better.   No follow-ups on file.  No orders of the defined types were placed in this encounter.   Requested Prescriptions   Signed Prescriptions Disp Refills  . cefdinir (OMNICEF) 300 MG capsule 20 capsule 0    Sig: Take 1 capsule (300 mg total) by mouth 2 (two) times daily.  . meloxicam (MOBIC) 7.5 MG tablet 30 tablet 0    Sig: Take 1 tablet (7.5 mg total) by mouth daily.

## 2017-11-26 ENCOUNTER — Encounter: Payer: Self-pay | Admitting: Pulmonary Disease

## 2017-11-26 ENCOUNTER — Ambulatory Visit: Payer: Medicare HMO | Admitting: Pulmonary Disease

## 2017-11-26 VITALS — BP 116/50 | HR 62 | Ht 59.45 in | Wt 178.0 lb

## 2017-11-26 DIAGNOSIS — J432 Centrilobular emphysema: Secondary | ICD-10-CM | POA: Diagnosis not present

## 2017-11-26 DIAGNOSIS — J9611 Chronic respiratory failure with hypoxia: Secondary | ICD-10-CM

## 2017-11-26 DIAGNOSIS — J849 Interstitial pulmonary disease, unspecified: Secondary | ICD-10-CM

## 2017-11-26 DIAGNOSIS — M7989 Other specified soft tissue disorders: Secondary | ICD-10-CM

## 2017-11-26 MED ORDER — FUROSEMIDE 80 MG PO TABS: 80.0000 mg | ORAL_TABLET | Freq: Every day | ORAL | 0 refills | Status: DC

## 2017-11-26 NOTE — Patient Instructions (Signed)
Interstitial lung disease: I would like to get a lung function test to make sure this is not progressed  Centrilobular emphysema: Keep using albuterol as needed for chest tightness wheezing or shortness of breath  Chronic respiratory failure with hypoxemia: Continue using 4 L of oxygen continuously, continuous flow  Get a high-dose flu shot when they become available Stay active Practice good hand hygiene  We will see you back in 3 months or sooner if needed

## 2017-11-26 NOTE — Progress Notes (Signed)
Subjective:    Patient ID: Dawn Gomez, female    DOB: 06/13/1935, 82 y.o.   MRN: [id number]  Synopsis:  Former patient of Dr. Gwenette Greet has pulmonary fibrosis of uncertain etiology.  She also has some centrilobular emphysema but has not had much benefit from long-acting bronchodilators.  She is dependent on 4 L of oxygen continuously, continuous flow only.  HPI Chief Complaint  Patient presents with  . Follow-up   Keneshia says that most of the itme she is OK with her breathing.  She says that her biggest prolem is that if she exerts herself too much she will get more short of breath, typically while mouth breathing.  She has no thad bronchitis or a chest infection.  She says that she has some sinus congestion.  She says that she has some post nasal drip.  Some left sided chest/back tightness.  She says that the [place] sample helped her breathing some, but it "wasn't fantastic".  Albuterol is helpful.  She uses it no more than 1 time per day.  Pulling the oxygen tanks around has been a big problem for her in terms.    Past Medical History:  Diagnosis Date  . Abnormal heart rhythm   . Aortic aneurysm ([place])   . Dyspnea   . Hypothyroid   . OSA (obstructive sleep apnea)    on cpap  . Psoriasis 02/05/2017  . Pulmonary fibrosis ([place])   . SVT (supraventricular tachycardia) ([place])   . Thyroid disease    hypo     Review of Systems  Constitutional: Positive for fatigue. Negative for chills and fever.  HENT: Negative for postnasal drip, rhinorrhea and sinus pressure.   Respiratory: Positive for shortness of breath. Negative for cough and wheezing.   Cardiovascular: Negative for chest pain, palpitations and leg swelling.       Objective:   Physical Exam Vitals:   11/26/17 1321  BP: (!) 116/50  Pulse: 62  SpO2: 97%  Weight: 178 lb (80.7 kg)  Height: 4' 11.45" (1.51 m)   2L [place]  Gen: chronically ill appearing HENT: OP clear, TM's clear, neck supple PULM: CTA B, normal  percussion CV: RRR, no mgr, trace edema GI: BS+, soft, nontender Derm: no cyanosis or rash Psyche: normal mood and affect    LABS  2009:  ANA, RF, ACE, ESR, all ok.  Autoimmune 01/2014:  Negative 04/2015 ANA, RF, CCP, JO-1, SSA, SSB, SCL-70, Anti-centromere all negative  PFT's  10/2010:  No obstruction, TLC 3.05 (73% pred), DLCO 6.1 (31%) PFT's 2013:  No obstruction, TLC 3.05 (73%), DLCO 6.3 (31%) PFT's 03/2013:  FEV1 1.52 (87%), ratio 88, FVC 1.73 (74%), TLC 2.80 (62%), DLCO 7.22 (38%) PFT's 01/2014:  FEV1 1.54 (90%), ratio 87, FVC 1.78 (76%), TLC 2.90 (65%), DLCO 6.22 (32%) 04/2015 PFT> FVC 1.67 L (73% predicted), total lung capacity 2.76 L (62% predicted), DLCO 6.50 (34% predicted). July 2017 pulmonary function testing ratio 89%, FVC 1.72 L 76% predicted, total lung capacity 3.08 L, 69% predicted, DLCO 5.89 31% predicted July 2018 pulmonary function tests: Ratio 91%, FVC 1.69 L, 76% predicted (pre FVC 1.84L) , total lung capacity 2.98 L 66% predicted, DLCO 5.18 27 percent protected  Imaging: CXR 10/2010:  Stable interstitial changes. CXR 2013:  Stable IS changes CT chest 2014:  Subpleural reticulation, ?early honeycombing, per chest radiology >> no definite GGO, may be UIP or maybe not.  HRCT 01/2014:  Slight progression of ISLD, and pattern suggestive of UIP 03/2015 CT  chest , stable fibrosis since 2014  2018 high-resolution CT chest images independently reviewed showing upper lobe predominant centrilobular emphysema with nonspecific patchy fibrotic changes in the bases somewhat in a bronchovascular distribution,    Other: ONO on RA with cpap 06/2012:  Low sat 85%, only 7 min less than 88% entire night.  Ambul ox 07/2013:  desat to 87% transiently.  Pt would like to hold off on portable oxygen   Echo: TTE 06/2012:  Normal LV, DD, normal RV, no evidence for pulmonary htn.  TTE 08/2017: LVEF 55%, Mild LVH, some septal flattening, RVSP 63mHg      Assessment & Plan:  Interstitial  lung disease ([place] - Plan: Pulmonary function test  Leg swelling - Plan: Basic Metabolic Panel (BMET)  Chronic respiratory failure with hypoxia (HCC)  Centrilobular emphysema (HCC)  Discussion: I have no evidence that Georgena's interstitial lung disease has worsened as her oxygenation has remained the same.  Unfortunately she is going to be dependent on tanks for oxygen flow for her to be able to get around safely.  She requested a portable oxygen concentrator but I explained to her today that there is no device which can provide the amount of oxygen she needs.  She will have to keep using tanks.  I like to get a lung function test to make sure that this is not progressed.  I am pleased that this year's echocardiogram did not show evidence of pulmonary hypertension.  It does not sound as if the bronchodilators made much of a difference for her breathing despite her centrilobular emphysema:  Plan: Interstitial lung disease: I would like to get a lung function test to make sure this is not progressed  Centrilobular emphysema: Keep using albuterol as needed for chest tightness wheezing or shortness of breath  Chronic respiratory failure with hypoxemia: Continue using 4 L of oxygen continuously, continuous flow  Get a high-dose flu shot when they become available Stay active Practice good hand hygiene  We will see you back in 3 months or sooner if needed      Current Outpatient Medications:  .  acetaminophen (TYLENOL) 500 MG tablet, Take 500 mg by mouth every 6 (six) hours as needed., Disp: , Rfl:  .  Ascorbic Acid (VITAMIN C PO), Take by mouth., Disp: , Rfl:  .  aspirin EC 81 MG tablet, Take 81 mg by mouth daily., Disp: , Rfl:  .  CALCIUM-MAGNESIUM-ZINC PO, Take 1 tablet by mouth daily., Disp: , Rfl:  .  cefdinir (OMNICEF) 300 MG capsule, Take 1 capsule (300 mg total) by mouth 2 (two) times daily., Disp: 20 capsule, Rfl: 0 .  cholecalciferol (VITAMIN D) 1000 units tablet, Take 5,000  Units by mouth daily., Disp: , Rfl:  .  diltiazem (CARDIZEM CD) 180 MG 24 hr capsule, Take 1 capsule (180 mg total) by mouth daily., Disp: 90 capsule, Rfl: 1 .  escitalopram (LEXAPRO) 20 MG tablet, Take 1 tablet (20 mg total) by mouth at bedtime., Disp: 90 tablet, Rfl: 3 .  fenofibrate (TRICOR) 145 MG tablet, Take 145 mg by mouth daily., Disp: , Rfl:  .  fluocinonide (LIDEX) 0.05 % external solution, APP EXT AA OF SCALP NIGHTLY, Disp: , Rfl: 3 .  fluticasone (FLONASE) 50 MCG/ACT nasal spray, Place 1 spray into both nostrils 2 (two) times daily., Disp: 16 g, Rfl: 2 .  furosemide (LASIX) 40 MG tablet, TAKE 1 TABLET (40 MG TOTAL) BY MOUTH DAILY., Disp: 90 tablet, Rfl: 1 .  ipratropium-albuterol (DUONEB)  0.5-2.5 (3) MG/3ML SOLN, Take 3 mLs by nebulization every 4 (four) hours as needed., Disp: 360 mL, Rfl: 0 .  levothyroxine (SYNTHROID, LEVOTHROID) 100 MCG tablet, Take 1 tablet (100 mcg total) by mouth daily before breakfast., Disp: 90 tablet, Rfl: 1 .  levothyroxine (SYNTHROID, LEVOTHROID) 50 MCG tablet, Take 50 mcg by mouth daily before breakfast., Disp: , Rfl:  .  lisinopril (PRINIVIL,ZESTRIL) 5 MG tablet, TAKE 1 TABLET (5 MG TOTAL) BY MOUTH DAILY., Disp: 90 tablet, Rfl: 2 .  meloxicam (MOBIC) 7.5 MG tablet, Take 1 tablet (7.5 mg total) by mouth daily., Disp: 30 tablet, Rfl: 0 .  potassium chloride SA (K-DUR,KLOR-CON) 20 MEQ tablet, Take 1 tablet (20 mEq) by mouth daily with food, Disp: 90 tablet, Rfl: 3 .  rosuvastatin (CRESTOR) 20 MG tablet, Take 1 tablet (20 mg total) by mouth daily., Disp: 90 tablet, Rfl: 3 .  traMADol (ULTRAM) 50 MG tablet, Take 1 tablet (50 mg total) by mouth every 12 (twelve) hours as needed., Disp: 180 tablet, Rfl: 1 .  traZODone (DESYREL) 50 MG tablet, Take 1 tablet (50 mg total) by mouth at bedtime as needed for sleep., Disp: 90 tablet, Rfl: 1 .  vitamin B-12 (CYANOCOBALAMIN) 1000 MCG tablet, Take 500 mcg by mouth daily., Disp: , Rfl:  .  vitamin C (ASCORBIC ACID) 500 MG  tablet, Take 500 mg by mouth daily., Disp: , Rfl:  .  Vitamin D, Ergocalciferol, (DRISDOL) 50000 units CAPS capsule, Take 1 capsule (50,000 Units total) by mouth every 7 (seven) days., Disp: 12 capsule, Rfl: 0 .  vitamin E 400 UNIT capsule, Take 200 Units by mouth daily., Disp: , Rfl:  .  furosemide (LASIX) 80 MG tablet, Take 1 tablet (80 mg total) by mouth daily., Disp: 90 tablet, Rfl: 0

## 2017-12-01 ENCOUNTER — Other Ambulatory Visit: Payer: Self-pay | Admitting: Pulmonary Disease

## 2017-12-01 MED ORDER — FLUTICASONE PROPIONATE 50 MCG/ACT NA SUSP: 1.0000 | Freq: Two times a day (BID) | NASAL | 2 refills | Status: DC

## 2017-12-09 ENCOUNTER — Telehealth: Payer: Self-pay | Admitting: Internal Medicine

## 2017-12-09 DIAGNOSIS — W19XXXA Unspecified fall, initial encounter: Secondary | ICD-10-CM

## 2017-12-09 NOTE — Telephone Encounter (Signed)
Ross for shirron to print off and I will sign, thanks

## 2017-12-09 NOTE — Telephone Encounter (Signed)
Done hardcopy to shirron 

## 2017-12-09 NOTE — Telephone Encounter (Signed)
Script on PCP desk.

## 2017-12-09 NOTE — Telephone Encounter (Signed)
Called patient and left message letting her know that if she is wanting something done about the pain before her appointment in December then she would need an appointment.

## 2017-12-09 NOTE — Telephone Encounter (Signed)
Copied from [place] [id number]. Topic: Inquiry >> Dec 09, 2017  9:09 AM Dawn Gomez, NT wrote: Reason for CRM: Patient called and states she needs a handicap chair for her bathtub. She states she gets dizzy when she stands up for a long period of time in the shower. She is wondering can Dr. Jenny Reichmann order her a chair. Please advise. CB# [phone]

## 2017-12-09 NOTE — Telephone Encounter (Signed)
Copied from [place] [id number]. Topic: Quick Communication - See Telephone Encounter >> Dec 09, 2017  9:11 AM Dawn Gomez, NT wrote: CRM for notification. See Telephone encounter for: 12/09/17. Patient called and states she is having pain in the back of her shoulder, back, hands. She thinks it is arthritis. She sees Dr. Jenny Reichmann in Dec. She is wondering if she needs to be seen sooner that Dec. Patient did not want to make an appt unless Dr. Jenny Reichmann wants her to be seen sooner  Please call patient CB# [phone]. Please call patient after 12 noon

## 2017-12-12 DIAGNOSIS — J449 Chronic obstructive pulmonary disease, unspecified: Secondary | ICD-10-CM | POA: Diagnosis not present

## 2017-12-12 DIAGNOSIS — J9610 Chronic respiratory failure, unspecified whether with hypoxia or hypercapnia: Secondary | ICD-10-CM | POA: Diagnosis not present

## 2017-12-12 DIAGNOSIS — J8410 Pulmonary fibrosis, unspecified: Secondary | ICD-10-CM | POA: Diagnosis not present

## 2017-12-12 DIAGNOSIS — G4733 Obstructive sleep apnea (adult) (pediatric): Secondary | ICD-10-CM | POA: Diagnosis not present

## 2017-12-12 DIAGNOSIS — J9621 Acute and chronic respiratory failure with hypoxia: Secondary | ICD-10-CM | POA: Diagnosis not present

## 2018-01-11 DIAGNOSIS — J8410 Pulmonary fibrosis, unspecified: Secondary | ICD-10-CM | POA: Diagnosis not present

## 2018-01-11 DIAGNOSIS — G4733 Obstructive sleep apnea (adult) (pediatric): Secondary | ICD-10-CM | POA: Diagnosis not present

## 2018-01-11 DIAGNOSIS — J9610 Chronic respiratory failure, unspecified whether with hypoxia or hypercapnia: Secondary | ICD-10-CM | POA: Diagnosis not present

## 2018-01-11 DIAGNOSIS — J449 Chronic obstructive pulmonary disease, unspecified: Secondary | ICD-10-CM | POA: Diagnosis not present

## 2018-01-11 DIAGNOSIS — J9621 Acute and chronic respiratory failure with hypoxia: Secondary | ICD-10-CM | POA: Diagnosis not present

## 2018-01-13 ENCOUNTER — Other Ambulatory Visit: Payer: Self-pay | Admitting: Internal Medicine

## 2018-01-23 ENCOUNTER — Ambulatory Visit: Payer: Medicare HMO | Admitting: Pulmonary Disease

## 2018-02-02 ENCOUNTER — Other Ambulatory Visit: Payer: Self-pay | Admitting: Pulmonary Disease

## 2018-02-09 ENCOUNTER — Ambulatory Visit: Payer: Medicare HMO | Admitting: Pulmonary Disease

## 2018-02-09 ENCOUNTER — Ambulatory Visit (INDEPENDENT_AMBULATORY_CARE_PROVIDER_SITE_OTHER): Payer: Medicare HMO | Admitting: Pulmonary Disease

## 2018-02-09 ENCOUNTER — Encounter: Payer: Self-pay | Admitting: Pulmonary Disease

## 2018-02-09 VITALS — BP 100/62 | HR 70 | Ht 60.0 in | Wt 168.0 lb

## 2018-02-09 DIAGNOSIS — J432 Centrilobular emphysema: Secondary | ICD-10-CM

## 2018-02-09 DIAGNOSIS — J849 Interstitial pulmonary disease, unspecified: Secondary | ICD-10-CM | POA: Diagnosis not present

## 2018-02-09 DIAGNOSIS — J9611 Chronic respiratory failure with hypoxia: Secondary | ICD-10-CM

## 2018-02-09 DIAGNOSIS — Z23 Encounter for immunization: Secondary | ICD-10-CM | POA: Diagnosis not present

## 2018-02-09 DIAGNOSIS — M7989 Other specified soft tissue disorders: Secondary | ICD-10-CM | POA: Diagnosis not present

## 2018-02-09 LAB — PULMONARY FUNCTION TEST
DL/VA % PRED: 50 %
DL/VA: 2.12 ml/min/mmHg/L
DLCO UNC % PRED: 30 %
DLCO unc: 5.72 ml/min/mmHg
FEF 25-75 POST: 2.34 L/s
FEF 25-75 Pre: 1.94 L/sec
FEF2575-%CHANGE-POST: 20 %
FEF2575-%PRED-POST: 189 %
FEF2575-%Pred-Pre: 157 %
FEV1-%Change-Post: 2 %
FEV1-%PRED-POST: 93 %
FEV1-%Pred-Pre: 90 %
FEV1-PRE: 1.46 L
FEV1-Post: 1.5 L
FEV1FVC-%Change-Post: 3 %
FEV1FVC-%PRED-PRE: 114 %
FEV6-%Change-Post: 0 %
FEV6-%PRED-POST: 83 %
FEV6-%Pred-Pre: 84 %
FEV6-Post: 1.71 L
FEV6-Pre: 1.72 L
FEV6FVC-%Pred-Post: 106 %
FEV6FVC-%Pred-Pre: 106 %
FVC-%CHANGE-POST: 0 %
FVC-%PRED-POST: 78 %
FVC-%Pred-Pre: 79 %
FVC-Post: 1.71 L
FVC-Pre: 1.73 L
PRE FEV1/FVC RATIO: 85 %
Post FEV1/FVC ratio: 88 %
Post FEV6/FVC ratio: 100 %
Pre FEV6/FVC Ratio: 100 %
RV % pred: 64 %
RV: 1.41 L
TLC % pred: 73 %
TLC: 3.29 L

## 2018-02-09 NOTE — Progress Notes (Signed)
PFT completed today.  

## 2018-02-09 NOTE — Patient Instructions (Signed)
Centrilobular emphysema: Keep using albuterol as needed for chest tightness wheezing or shortness of breath We will send a prescription for a new nebulizer machine  Chronic respiratory failure with hypoxemia: Continue using 4 L of oxygen continuously, continuous flow  High-dose flu shot today Your pneumonia vaccines are up-to-date  Interstitial lung disease: I have no evidence that this is progressed which is a good thing  Practice good hand hygiene Stay active  We will see you back in 4 months or sooner if needed

## 2018-02-09 NOTE — Progress Notes (Signed)
Subjective:    Patient ID: Dawn Gomez, female    DOB: 24-Nov-1935, 82 y.o.   MRN: [id number]  Synopsis:  Former patient of Dawn Gomez has pulmonary fibrosis of uncertain etiology.  She also has some centrilobular emphysema but has not had much benefit from long-acting bronchodilators.  She is dependent on 4 L of oxygen continuously, continuous flow only.  HPI Chief Complaint  Patient presents with  . Interstitial Lung Disease    reports needs new nebulizer machine, postnasal drip   Dawn Gomez says she has been doing "good in a lot of ways".  Unfortunately she had a spell of bronchitis not too long ago however.  She says that she started with sinus symptoms.  She treated it with pickled beets and grape juice and it worked.  She has been trying to cut calories and she has been eating more fresh vegetbales lately.  She has lost 10 pounds this way.  She says taht she feels much better doing this.    Past Medical History:  Diagnosis Date  . Abnormal heart rhythm   . Aortic aneurysm ([place])   . Dyspnea   . Hypothyroid   . OSA (obstructive sleep apnea)    on cpap  . Psoriasis 02/05/2017  . Pulmonary fibrosis ([place])   . SVT (supraventricular tachycardia) ([place])   . Thyroid disease    hypo     Review of Systems  Constitutional: Positive for fatigue. Negative for chills and fever.  HENT: Negative for postnasal drip, rhinorrhea and sinus pressure.   Respiratory: Positive for shortness of breath. Negative for cough and wheezing.   Cardiovascular: Negative for chest pain, palpitations and leg swelling.       Objective:   Physical Exam Vitals:   02/09/18 1509  BP: 100/62  Pulse: 70  SpO2: 90%  Weight: 168 lb (76.2 kg)  Height: 5' (1.524 m)   2L   Gen: chronically ill appearing HENT: OP clear, TM's clear, neck supple PULM: CTA B, normal percussion CV: RRR, no mgr, trace edema GI: BS+, soft, nontender Derm: no cyanosis or rash Psyche: normal mood and affect   LABS  2009:   ANA, RF, ACE, ESR, all ok.  Autoimmune 01/2014:  Negative 04/2015 ANA, RF, CCP, JO-1, SSA, SSB, SCL-70, Anti-centromere all negative  PFT's  10/2010:  No obstruction, TLC 3.05 (73% pred), DLCO 6.1 (31%) PFT's 2013:  No obstruction, TLC 3.05 (73%), DLCO 6.3 (31%) PFT's 03/2013:  FEV1 1.52 (87%), ratio 88, FVC 1.73 (74%), TLC 2.80 (62%), DLCO 7.22 (38%) PFT's 01/2014:  FEV1 1.54 (90%), ratio 87, FVC 1.78 (76%), TLC 2.90 (65%), DLCO 6.22 (32%) 04/2015 PFT> FVC 1.67 L (73% predicted), total lung capacity 2.76 L (62% predicted), DLCO 6.50 (34% predicted). July 2017 pulmonary function testing ratio 89%, FVC 1.72 L 76% predicted, total lung capacity 3.08 L, 69% predicted, DLCO 5.89 31% predicted July 2018 pulmonary function tests: Ratio 91%, FVC 1.69 L, 76% predicted (pre FVC 1.84L) , total lung capacity 2.98 L 66% predicted, DLCO 5.18 27% predicted November 2019 lung function test: FVC 1.71 L 78% predicted, DLCO 5.72 mL 30% predicted  Imaging: CXR 10/2010:  Stable interstitial changes. CXR 2013:  Stable IS changes CT chest 2014:  Subpleural reticulation, ?early honeycombing, per chest radiology >> no definite GGO, may be UIP or maybe not.  HRCT 01/2014:  Slight progression of ISLD, and pattern suggestive of UIP 03/2015 CT chest , stable fibrosis since 2014  2018 high-resolution CT chest images independently reviewed  showing upper lobe predominant centrilobular emphysema with nonspecific patchy fibrotic changes in the bases somewhat in a bronchovascular distribution,    Other: ONO on RA with cpap 06/2012:  Low sat 85%, only 7 min less than 88% entire night.  Ambul ox 07/2013:  desat to 87% transiently.  Pt would like to hold off on portable oxygen   Echo: TTE 06/2012:  Normal LV, DD, normal RV, no evidence for pulmonary htn.  TTE 08/2017: LVEF 55%, Mild LVH, some septal flattening, RVSP 48mHg      Assessment & Plan:  Interstitial lung disease ([place] - Plan: Ambulatory Referral for DME  Leg  swelling  Chronic respiratory failure with hypoxia ([place]  Centrilobular emphysema ([place]  Discussion: This has been a stable interval for Dawn Gomez  Her lung function testing today has not changed at all compared to 2018 which is a good thing so I have no evidence that her ill-defined interstitial lung disease has changed. She is now oxygen dependent and will remain so. Her COPD/centrilobular emphysema has not worsened.  She has never really done well with long-acting bronchodilators.  Plan:  Centrilobular emphysema: Keep using albuterol as needed for chest tightness wheezing or shortness of breath We will send a prescription for a new nebulizer machine  Chronic respiratory failure with hypoxemia: Continue using 4 L of oxygen continuously, continuous flow  High-dose flu shot today Your pneumonia vaccines are up-to-date  Interstitial lung disease: I have no evidence that this is progressed which is a good thing  Practice good hand hygiene Stay active  We will see you back in 4 months or sooner if needed     Current Outpatient Medications:  .  acetaminophen (TYLENOL) 500 MG tablet, Take 500 mg by mouth every 6 (six) hours as needed., Disp: , Rfl:  .  Ascorbic Acid (VITAMIN C PO), Take by mouth., Disp: , Rfl:  .  aspirin EC 81 MG tablet, Take 81 mg by mouth daily., Disp: , Rfl:  .  CALCIUM-MAGNESIUM-ZINC PO, Take 1 tablet by mouth daily., Disp: , Rfl:  .  cholecalciferol (VITAMIN D) 1000 units tablet, Take 5,000 Units by mouth daily., Disp: , Rfl:  .  diltiazem (CARDIZEM CD) 180 MG 24 hr capsule, Take 1 capsule (180 mg total) by mouth daily., Disp: 90 capsule, Rfl: 1 .  escitalopram (LEXAPRO) 20 MG tablet, Take 1 tablet (20 mg total) by mouth at bedtime., Disp: 90 tablet, Rfl: 3 .  fenofibrate (TRICOR) 145 MG tablet, Take 145 mg by mouth daily., Disp: , Rfl:  .  fluocinonide (LIDEX) 0.05 % external solution, APP EXT AA OF SCALP NIGHTLY, Disp: , Rfl: 3 .  fluticasone (FLONASE) 50  MCG/ACT nasal spray, USE 1 SPRAY IN EACH NOSTRIL TWICE DAILY, Disp: 48 g, Rfl: 2 .  furosemide (LASIX) 80 MG tablet, TAKE 1 TABLET (80 MG TOTAL) BY MOUTH DAILY., Disp: 90 tablet, Rfl: 0 .  ipratropium-albuterol (DUONEB) 0.5-2.5 (3) MG/3ML SOLN, Take 3 mLs by nebulization every 4 (four) hours as needed., Disp: 360 mL, Rfl: 0 .  levothyroxine (SYNTHROID, LEVOTHROID) 100 MCG tablet, TAKE 1 TABLET (100 MCG TOTAL) BY MOUTH DAILY BEFORE BREAKFAST., Disp: 90 tablet, Rfl: 1 .  lisinopril (PRINIVIL,ZESTRIL) 5 MG tablet, TAKE 1 TABLET (5 MG TOTAL) BY MOUTH DAILY., Disp: 90 tablet, Rfl: 2 .  meloxicam (MOBIC) 7.5 MG tablet, Take 1 tablet (7.5 mg total) by mouth daily., Disp: 30 tablet, Rfl: 0 .  potassium chloride SA (K-DUR,KLOR-CON) 20 MEQ tablet, Take 1 tablet (20 mEq)  by mouth daily with food, Disp: 90 tablet, Rfl: 3 .  rosuvastatin (CRESTOR) 20 MG tablet, Take 1 tablet (20 mg total) by mouth daily., Disp: 90 tablet, Rfl: 3 .  traMADol (ULTRAM) 50 MG tablet, Take 1 tablet (50 mg total) by mouth every 12 (twelve) hours as needed., Disp: 180 tablet, Rfl: 1 .  traZODone (DESYREL) 50 MG tablet, Take 1 tablet (50 mg total) by mouth at bedtime as needed for sleep., Disp: 90 tablet, Rfl: 1 .  vitamin B-12 (CYANOCOBALAMIN) 1000 MCG tablet, Take 500 mcg by mouth daily., Disp: , Rfl:  .  vitamin C (ASCORBIC ACID) 500 MG tablet, Take 500 mg by mouth daily., Disp: , Rfl:  .  Vitamin D, Ergocalciferol, (DRISDOL) 50000 units CAPS capsule, Take 1 capsule (50,000 Units total) by mouth every 7 (seven) days., Disp: 12 capsule, Rfl: 0 .  vitamin E 400 UNIT capsule, Take 200 Units by mouth daily., Disp: , Rfl:

## 2018-02-11 ENCOUNTER — Other Ambulatory Visit: Payer: Self-pay | Admitting: Internal Medicine

## 2018-02-11 ENCOUNTER — Telehealth: Payer: Self-pay | Admitting: Internal Medicine

## 2018-02-11 DIAGNOSIS — J8410 Pulmonary fibrosis, unspecified: Secondary | ICD-10-CM | POA: Diagnosis not present

## 2018-02-11 DIAGNOSIS — J449 Chronic obstructive pulmonary disease, unspecified: Secondary | ICD-10-CM | POA: Diagnosis not present

## 2018-02-11 DIAGNOSIS — G4733 Obstructive sleep apnea (adult) (pediatric): Secondary | ICD-10-CM | POA: Diagnosis not present

## 2018-02-11 DIAGNOSIS — J9610 Chronic respiratory failure, unspecified whether with hypoxia or hypercapnia: Secondary | ICD-10-CM | POA: Diagnosis not present

## 2018-02-11 DIAGNOSIS — J9621 Acute and chronic respiratory failure with hypoxia: Secondary | ICD-10-CM | POA: Diagnosis not present

## 2018-02-11 MED ORDER — FENOFIBRATE 160 MG PO TABS: 160.0000 mg | ORAL_TABLET | Freq: Every day | ORAL | 3 refills | Status: DC

## 2018-02-11 NOTE — Telephone Encounter (Signed)
For for change to 160 mg  - done erx to [facility] mail order

## 2018-02-11 NOTE — Telephone Encounter (Signed)
Please advise on medication change request.

## 2018-02-11 NOTE — Addendum Note (Signed)
Addended by: Biagio Borg on: 02/11/2018 12:17 PM   Modules accepted: Orders

## 2018-02-11 NOTE — Telephone Encounter (Signed)
Copied from [place] [id number]. Topic: General - Other >> Feb 11, 2018  8:14 AM Oneta Rack wrote: Relation to pt: self  Call back number: [phone] Dawn Gomez) Pharmacy: [facility] [place], [place] [phone]) [phone] (Fax)  Reason for call:  Patient requesting fenofibrate (TRICOR) 145 MG tablet dosage changed to 160 MG due to the cost of the prescription being zero, please advise

## 2018-03-02 ENCOUNTER — Other Ambulatory Visit: Payer: Self-pay | Admitting: Internal Medicine

## 2018-03-12 ENCOUNTER — Telehealth: Payer: Self-pay | Admitting: Pulmonary Disease

## 2018-03-12 NOTE — Telephone Encounter (Signed)
Called and spoke to patient, requesting update on her nebulizer  Machine.   Per [facility] notes:  Spoke to [place].  Pt received a new nebulizer on 08/11/16 so is not eligible for a new one.  Neb is 47.00 private pay.  If machine is broken can be brought it and switched out.  Patient voices understanding. Patient states she can buy a new machine and she will call [facility] to set that up.   Called and spoke to [place] and made [place] aware patient will be coming by to purchase new machine. Corene Cornea also states to inform patient to bring her old machine with her. Informed patient that she needs to take her old machine.   Nothing further is needed at this time.

## 2018-03-13 DIAGNOSIS — J8410 Pulmonary fibrosis, unspecified: Secondary | ICD-10-CM | POA: Diagnosis not present

## 2018-03-13 DIAGNOSIS — G4733 Obstructive sleep apnea (adult) (pediatric): Secondary | ICD-10-CM | POA: Diagnosis not present

## 2018-03-13 DIAGNOSIS — J9610 Chronic respiratory failure, unspecified whether with hypoxia or hypercapnia: Secondary | ICD-10-CM | POA: Diagnosis not present

## 2018-03-13 DIAGNOSIS — J449 Chronic obstructive pulmonary disease, unspecified: Secondary | ICD-10-CM | POA: Diagnosis not present

## 2018-03-13 DIAGNOSIS — J9621 Acute and chronic respiratory failure with hypoxia: Secondary | ICD-10-CM | POA: Diagnosis not present

## 2018-03-19 ENCOUNTER — Telehealth: Payer: Self-pay | Admitting: Internal Medicine

## 2018-03-19 MED ORDER — BLOOD GLUCOSE METER KIT: PACK | 0 refills | Status: DC

## 2018-03-19 NOTE — Telephone Encounter (Signed)
Script for meter has been faxed

## 2018-03-19 NOTE — Telephone Encounter (Signed)
Copied from [place] [phone]. Topic: Quick Communication - See Telephone Encounter >> Mar 19, 2018 10:41 AM Bea Graff, NT wrote: CRM for notification. See Telephone encounter for: 03/19/18. Sharyn Lull with [facility] calling to check status on the pt getting the new glucose meter that was requested on 03/11/18 via fax. Please advise. CB#: [phone]

## 2018-03-20 ENCOUNTER — Other Ambulatory Visit: Payer: Self-pay | Admitting: Internal Medicine

## 2018-03-20 ENCOUNTER — Telehealth: Payer: Self-pay | Admitting: Internal Medicine

## 2018-03-20 ENCOUNTER — Other Ambulatory Visit: Payer: Self-pay | Admitting: *Deleted

## 2018-03-20 NOTE — Telephone Encounter (Signed)
Copied from [place] [phone]. Topic: Quick Communication - Rx Refill/Question >> Mar 20, 2018  8:32 AM Virl Axe D wrote: Medication: traMADol (ULTRAM) 50 MG tablet / pt stated [facility] pharmacy tried to contact office for refill request and was unsuccessful  Has the patient contacted their pharmacy? Yes  (Agent: If no, request that the patient contact the pharmacy for the refill.) (Agent: If yes, when and what did the pharmacy advise?)  Preferred Pharmacy (with phone number or street name): [facility], [place] [phone] (Phone) [phone] (Fax)    Agent: Please be advised that RX refills may take up to 3 business days. We ask that you follow-up with your pharmacy.

## 2018-03-20 NOTE — Telephone Encounter (Signed)
Call to pharmacy- they have faxed over request for solution and alcohol pads- verbal ok given for refill on those supplies per Rx written on 03/19/18. They have not received that Rx- noticed it was print- will send note to office to make sure that if it has to be faxed- it is resent- or if it is sent electronically they change it to normal and resend.

## 2018-03-20 NOTE — Telephone Encounter (Signed)
Ok for refills as needed

## 2018-03-20 NOTE — Telephone Encounter (Signed)
Copied from [place] [id number]. Topic: Quick Communication - Rx Refill/Question >> Mar 20, 2018  9:43 AM Leward Quan A wrote: Medication: BD Single Use Swab, TRUE Metrix Level 1 (Low) Control Solution  Has the patient contacted their pharmacy? Yes.   (Agent: If no, request that the patient contact the pharmacy for the refill.) (Agent: If yes, when and what did the pharmacy advise?)  Preferred Pharmacy (with phone number or street name): [place], [place] [phone] (Phone) [phone] (Fax)    Agent: Please be advised that RX refills may take up to 3 business days. We ask that you follow-up with your pharmacy.

## 2018-03-24 NOTE — Progress Notes (Addendum)
Subjective:   Dawn Gomez is a 82 y.o. female who presents for an Initial Medicare Annual Wellness Visit.  Review of Systems    No ROS.  Medicare Wellness Visit. Additional risk factors are reflected in the social history. Cardiac Risk Factors include: advanced age (>53mn, >>32women);dyslipidemia;obesity (BMI >30kg/m2) Sleep patterns: gets up 1-2 times nightly to void and sleeps 6-7 hours nightly.    Home Safety/Smoke Alarms: Feels safe in home. Smoke alarms in place.  Living environment; residence and Firearm Safety: 2Parks can live on one level, equipment: HHydrologist Type: Tub SSurveyor, quantity no firearms. Lives with son, no needs for DME, good support system Seat Belt Safety/Bike Helmet: Wears seat belt.      Objective:    Today's Vitals   03/25/18 1312  BP: 134/78  Pulse: 70  Resp: (!) 93  Weight: 171 lb (77.6 kg)  Height: 5' (1.524 m)   Body mass index is 33.4 kg/m.  Advanced Directives 03/25/2018 08/22/2016 08/20/2016 08/14/2016 08/07/2016 08/05/2016 05/22/2015  Does Patient Have a Medical Advance Directive? No No No No - No No  Would patient like information on creating a medical advance directive? Yes (ED - Information included in AVS) No - Patient declined No - Patient declined No - Patient declined No - Patient declined - No - patient declined information    Current Medications (verified) Outpatient Encounter Medications as of 03/25/2018  Medication Sig  . acetaminophen (TYLENOL) 500 MG tablet Take 500 mg by mouth every 6 (six) hours as needed.  . Ascorbic Acid (VITAMIN C PO) Take by mouth.  .Marland Kitchenaspirin EC 81 MG tablet Take 81 mg by mouth daily.  . blood glucose meter kit and supplies Dispense based on patient and insurance preference. Use up to four times daily as directed. (FOR ICD-10 E11.9).  . CALCIUM-MAGNESIUM-ZINC PO Take 1 tablet by mouth daily.  . cholecalciferol (VITAMIN D) 1000 units tablet Take 5,000 Units by mouth daily.  .Marland Kitchendiltiazem (CARDIZEM CD)  180 MG 24 hr capsule Take 1 capsule (180 mg total) by mouth daily.  .Marland Kitchenescitalopram (LEXAPRO) 20 MG tablet Take 1 tablet (20 mg total) by mouth at bedtime.  . fenofibrate 160 MG tablet Take 1 tablet (160 mg total) by mouth daily.  . fluocinonide (LIDEX) 0.05 % external solution APP EXT AA OF SCALP NIGHTLY  . furosemide (LASIX) 80 MG tablet TAKE 1 TABLET (80 MG TOTAL) BY MOUTH DAILY.  .Marland Kitchenipratropium-albuterol (DUONEB) 0.5-2.5 (3) MG/3ML SOLN Take 3 mLs by nebulization every 4 (four) hours as needed.  .Marland Kitchenlevothyroxine (SYNTHROID, LEVOTHROID) 100 MCG tablet TAKE 1 TABLET (100 MCG TOTAL) BY MOUTH DAILY BEFORE BREAKFAST.  .Marland Kitchenlisinopril (PRINIVIL,ZESTRIL) 5 MG tablet TAKE 1 TABLET (5 MG TOTAL) BY MOUTH DAILY.  . meloxicam (MOBIC) 7.5 MG tablet Take 1 tablet (7.5 mg total) by mouth daily.  . potassium chloride SA (K-DUR,KLOR-CON) 20 MEQ tablet Take 1 tablet (20 mEq) by mouth daily with food  . rosuvastatin (CRESTOR) 20 MG tablet TAKE 1 TABLET EVERY DAY  . traZODone (DESYREL) 50 MG tablet TAKE 1 TABLET (50 MG TOTAL) BY MOUTH AT BEDTIME AS NEEDED FOR SLEEP.  .Marland Kitchentriamcinolone (NASACORT) 55 MCG/ACT AERO nasal inhaler Place 2 sprays into the nose daily.  . vitamin B-12 (CYANOCOBALAMIN) 1000 MCG tablet Take 500 mcg by mouth daily.  . vitamin C (ASCORBIC ACID) 500 MG tablet Take 500 mg by mouth daily.  . Vitamin D, Ergocalciferol, (DRISDOL) 50000 units CAPS capsule Take 1 capsule (50,000 Units  total) by mouth every 7 (seven) days.  . vitamin E 400 UNIT capsule Take 200 Units by mouth daily.  . [DISCONTINUED] fluticasone (FLONASE) 50 MCG/ACT nasal spray USE 1 SPRAY IN EACH NOSTRIL TWICE DAILY  . [DISCONTINUED] traMADol (ULTRAM) 50 MG tablet Take 1 tablet (50 mg total) by mouth every 12 (twelve) hours as needed.   No facility-administered encounter medications on file as of 03/25/2018.     Allergies (verified) Lipitor [atorvastatin]; Codeine; Hydromorphone hcl; Levaquin [levofloxacin in d5w]; Morphine;  Oxycodone-acetaminophen; Propoxyphene n-acetaminophen; and Simvastatin   History: Past Medical History:  Diagnosis Date  . Abnormal heart rhythm   . Aortic aneurysm ([place])   . Dyspnea   . Hypothyroid   . OSA (obstructive sleep apnea)    on cpap  . Psoriasis 02/05/2017  . Pulmonary fibrosis ([place])   . SVT (supraventricular tachycardia) ([place])   . Thyroid disease    hypo   Past Surgical History:  Procedure Laterality Date  . ABDOMINAL HYSTERECTOMY    . APPENDECTOMY    . CHOLECYSTECTOMY    . CRANIOTOMY     for aneurysms  . CRANIOTOMY  1980 x 2   aneurysmal clipping  . HERNIA REPAIR    . TOTAL ABDOMINAL HYSTERECTOMY    . TUBAL LIGATION     Family History  Problem Relation Age of Onset  . Emphysema Father   . Heart disease Father   . Clotting disorder Father   . Rheum arthritis Father   . Lung cancer Father   . Prostate cancer Father   . Bone cancer Father   . Emphysema Sister   . Emphysema Mother   . Heart disease Mother   . Rheum arthritis Mother   . Lung cancer Mother   . Allergies Sister   . Clotting disorder Sister   . Rheum arthritis Sister   . Breast cancer Sister   . Cancer Sister        ureter   Social History   Socioeconomic History  . Marital status: Single    Spouse name: Not on file  . Number of children: 1  . Years of education: 40  . Highest education level: Not on file  Occupational History  . Occupation: retired Therapist, sports  . Occupation: retired  Scientific laboratory technician  . Financial resource strain: Not on file  . Food insecurity:    Worry: Not on file    Inability: Not on file  . Transportation needs:    Medical: Not on file    Non-medical: Not on file  Tobacco Use  . Smoking status: Former Smoker    Packs/day: 2.00    Years: 50.00    Pack years: 100.00    Types: Cigarettes    Last attempt to quit: 2008    Years since quitting: 11.9  . Smokeless tobacco: Never Used  Substance and Sexual Activity  . Alcohol use: Yes    Comment: "drank back in her  younger wilder days"  . Drug use: No  . Sexual activity: Not on file  Lifestyle  . Physical activity:    Days per week: Not on file    Minutes per session: Not on file  . Stress: Not on file  Relationships  . Social connections:    Talks on phone: Not on file    Gets together: Not on file    Attends religious service: Not on file    Active member of club or organization: Not on file    Attends meetings of  clubs or organizations: Not on file    Relationship status: Not on file  Other Topics Concern  . Not on file  Social History Narrative   ** Merged History Encounter **        Tobacco Counseling Counseling given: Not Answered  Activities of Daily Living In your present state of health, do you have any difficulty performing the following activities: 03/25/2018  Hearing? Y  Vision? N  Difficulty concentrating or making decisions? N  Walking or climbing stairs? Y  Dressing or bathing? N  Doing errands, shopping? N  Preparing Food and eating ? N  Using the Toilet? N  In the past six months, have you accidently leaked urine? N  Do you have problems with loss of bowel control? N  Managing your Medications? N  Managing your Finances? N  Housekeeping or managing your Housekeeping? N  Some recent data might be hidden     Immunizations and Health Maintenance Immunization History  Administered Date(s) Administered  . Influenza Whole 02/06/2009, 11/06/2009, 01/29/2012  . Influenza, High Dose Seasonal PF 02/05/2017, 02/09/2018  . Influenza,inj,Quad PF,6+ Mos 02/08/2013, 01/28/2014  . Influenza-Unspecified 03/09/2015, 12/08/2015  . Pneumococcal Polysaccharide-23 02/06/2009, 09/26/2017  . Pneumococcal-Unspecified 04/08/2014  . Td 08/06/2012   There are no preventive care reminders to display for this patient.  Patient Care Team: Biagio Borg, MD as PCP - General (Internal Medicine) Elna Breslow Ples Specter, FNP (Family Medicine)  Indicate any recent Medical Services you may  have received from other than [facility] providers in the past year (date may be approximate).     Assessment:   This is a routine wellness examination for [place]. Physical assessment deferred to PCP.   Hearing/Vision screen Hearing Screening Comments: HOH, has hearing aids. Contact Hearing Vision Screening Comments: appointment yearly Dr. Katy Fitch   Dietary issues and exercise activities discussed: Current Exercise Habits: The patient does not participate in regular exercise at present(chair exercise print-outs provided), Exercise limited by: respiratory conditions(s);orthopedic condition(s) Diet (meal preparation, eat out, water intake, caffeinated beverages, dairy products, fruits and vegetables): in general, a "healthy" diet  , well balanced   Reviewed heart healthy diet. Encouraged patient to increase daily water and healthy fluid intake. Discussed weight loss diet and strategies. Relevant patient education assigned to patient using Emmi.  Goals   None    Depression Screen PHQ 2/9 Scores 03/25/2018 09/26/2017 08/22/2016 08/14/2016 07/12/2016 05/22/2015  PHQ - 2 Score 1 0 0 0 2 2  PHQ- 9 Score 3 - - - 5 14    Fall Risk Fall Risk  03/25/2018 09/26/2017 02/05/2017 08/14/2016 07/12/2016  Falls in the past year? 0 Yes No Yes No  Number falls in past yr: - 1 - 2 or more -  Injury with Fall? - - - No -  Risk Factor Category  - - - High Fall Risk -  Risk for fall due to : Impaired balance/gait - - History of fall(s);Impaired balance/gait;Impaired mobility;Impaired vision Impaired balance/gait  Follow up Falls prevention discussed - - - -    Cognitive Function: MMSE - Mini Mental State Exam 03/25/2018  Orientation to time 5  Orientation to Place 5  Registration 3  Attention/ Calculation 5  Recall 1  Language- name 2 objects 2  Language- repeat 1  Language- follow 3 step command 3  Language- read & follow direction 1  Write a sentence 1  Copy design 1  Total score 28        Screening  Tests Health Maintenance  Topic Date Due  . DEXA SCAN  09/27/2018 (Originally 04/01/2003)  . PNA vac Low Risk Adult (2 of 2 - PCV13) 09/27/2018  . TETANUS/TDAP  08/07/2022  . INFLUENZA VACCINE  Completed     Plan:    Solicitor resources provided.  Continue doing brain stimulating activities (puzzles, reading, adult coloring books, staying active) to keep memory sharp.   Continue to eat heart healthy diet (full of fruits, vegetables, whole grains, lean protein, water--limit salt, fat, and sugar intake) and increase physical activity as tolerated.  I have personally reviewed and noted the following in the patient's chart:   . Medical and social history . Use of alcohol, tobacco or illicit drugs  . Current medications and supplements . Functional ability and status . Nutritional status . Physical activity . Advanced directives . List of other physicians . Vitals . Screenings to include cognitive, depression, and falls . Referrals and appointments  In addition, I have reviewed and discussed with patient certain preventive protocols, quality metrics, and best practice recommendations. A written personalized care plan for preventive services as well as general preventive health recommendations were provided to patient.     Michiel Cowboy, RN   03/25/2018     Medical screening examination/treatment/procedure(s) were performed by non-physician practitioner and as supervising physician I was immediately available for consultation/collaboration. I agree with above. Cathlean Cower, MD

## 2018-03-25 ENCOUNTER — Encounter: Payer: Self-pay | Admitting: Internal Medicine

## 2018-03-25 ENCOUNTER — Ambulatory Visit (INDEPENDENT_AMBULATORY_CARE_PROVIDER_SITE_OTHER): Payer: Medicare HMO | Admitting: Internal Medicine

## 2018-03-25 ENCOUNTER — Ambulatory Visit (INDEPENDENT_AMBULATORY_CARE_PROVIDER_SITE_OTHER): Payer: Medicare HMO | Admitting: *Deleted

## 2018-03-25 ENCOUNTER — Other Ambulatory Visit (INDEPENDENT_AMBULATORY_CARE_PROVIDER_SITE_OTHER): Payer: Medicare HMO

## 2018-03-25 VITALS — BP 134/78 | HR 70 | Resp 93 | Ht 60.0 in | Wt 171.0 lb

## 2018-03-25 VITALS — BP 134/78 | HR 70 | Temp 98.3°F | Ht 60.0 in | Wt 171.0 lb

## 2018-03-25 DIAGNOSIS — M79605 Pain in left leg: Secondary | ICD-10-CM | POA: Diagnosis not present

## 2018-03-25 DIAGNOSIS — Z0000 Encounter for general adult medical examination without abnormal findings: Secondary | ICD-10-CM

## 2018-03-25 DIAGNOSIS — R739 Hyperglycemia, unspecified: Secondary | ICD-10-CM | POA: Diagnosis not present

## 2018-03-25 DIAGNOSIS — E782 Mixed hyperlipidemia: Secondary | ICD-10-CM

## 2018-03-25 DIAGNOSIS — E2839 Other primary ovarian failure: Secondary | ICD-10-CM

## 2018-03-25 DIAGNOSIS — E559 Vitamin D deficiency, unspecified: Secondary | ICD-10-CM

## 2018-03-25 DIAGNOSIS — M79606 Pain in leg, unspecified: Secondary | ICD-10-CM | POA: Insufficient documentation

## 2018-03-25 DIAGNOSIS — E039 Hypothyroidism, unspecified: Secondary | ICD-10-CM

## 2018-03-25 DIAGNOSIS — E538 Deficiency of other specified B group vitamins: Secondary | ICD-10-CM

## 2018-03-25 DIAGNOSIS — J309 Allergic rhinitis, unspecified: Secondary | ICD-10-CM | POA: Diagnosis not present

## 2018-03-25 DIAGNOSIS — M79604 Pain in right leg: Secondary | ICD-10-CM

## 2018-03-25 LAB — CBC WITH DIFFERENTIAL/PLATELET
BASOS PCT: 1.5 % (ref 0.0–3.0)
Basophils Absolute: 0.1 10*3/uL (ref 0.0–0.1)
Eosinophils Absolute: 0.2 10*3/uL (ref 0.0–0.7)
Eosinophils Relative: 2.9 % (ref 0.0–5.0)
HCT: 37.1 % (ref 36.0–46.0)
Hemoglobin: 12.4 g/dL (ref 12.0–15.0)
Lymphocytes Relative: 20.9 % (ref 12.0–46.0)
Lymphs Abs: 1.3 10*3/uL (ref 0.7–4.0)
MCHC: 33.4 g/dL (ref 30.0–36.0)
MCV: 93.5 fl (ref 78.0–100.0)
Monocytes Absolute: 0.4 10*3/uL (ref 0.1–1.0)
Monocytes Relative: 7.3 % (ref 3.0–12.0)
Neutro Abs: 4.1 10*3/uL (ref 1.4–7.7)
Neutrophils Relative %: 67.4 % (ref 43.0–77.0)
Platelets: 268 10*3/uL (ref 150.0–400.0)
RBC: 3.97 Mil/uL (ref 3.87–5.11)
RDW: 13.8 % (ref 11.5–15.5)
WBC: 6.1 10*3/uL (ref 4.0–10.5)

## 2018-03-25 LAB — URINALYSIS, ROUTINE W REFLEX MICROSCOPIC
Bilirubin Urine: NEGATIVE
Hgb urine dipstick: NEGATIVE
Ketones, ur: NEGATIVE
Leukocytes, UA: NEGATIVE
NITRITE: NEGATIVE
RBC / HPF: NONE SEEN (ref 0–?)
Specific Gravity, Urine: 1.01 (ref 1.000–1.030)
TOTAL PROTEIN, URINE-UPE24: NEGATIVE
Urine Glucose: NEGATIVE
Urobilinogen, UA: 0.2 (ref 0.0–1.0)
pH: 8 (ref 5.0–8.0)

## 2018-03-25 LAB — LIPID PANEL
CHOLESTEROL: 123 mg/dL (ref 0–200)
HDL: 48.6 mg/dL (ref >39.00)
LDL Cholesterol: 48 mg/dL (ref 0–99)
NonHDL: 74.09
Total CHOL/HDL Ratio: 3
Triglycerides: 132 mg/dL (ref 0.0–149.0)
VLDL: 26.4 mg/dL (ref 0.0–40.0)

## 2018-03-25 LAB — BASIC METABOLIC PANEL
BUN: 16 mg/dL (ref 6–23)
CO2: 28 mEq/L (ref 19–32)
Calcium: 9.6 mg/dL (ref 8.4–10.5)
Chloride: 97 mEq/L (ref 96–112)
Creatinine, Ser: 0.88 mg/dL (ref 0.40–1.20)
GFR: 65.72 mL/min (ref >60.00)
Glucose, Bld: 103 mg/dL — ABNORMAL HIGH (ref 70–99)
Potassium: 4.7 mEq/L (ref 3.5–5.1)
Sodium: 133 mEq/L — ABNORMAL LOW (ref 135–145)

## 2018-03-25 LAB — HEPATIC FUNCTION PANEL
ALT: 19 U/L (ref 0–35)
AST: 17 U/L (ref 0–37)
Albumin: 4.4 g/dL (ref 3.5–5.2)
Alkaline Phosphatase: 42 U/L (ref 39–117)
Bilirubin, Direct: 0.1 mg/dL (ref 0.0–0.3)
Total Bilirubin: 0.5 mg/dL (ref 0.2–1.2)
Total Protein: 7 g/dL (ref 6.0–8.3)

## 2018-03-25 LAB — VITAMIN D 25 HYDROXY (VIT D DEFICIENCY, FRACTURES): VITD: 44.11 ng/mL (ref 30.00–100.00)

## 2018-03-25 LAB — HEMOGLOBIN A1C: HEMOGLOBIN A1C: 5.5 % (ref 4.6–6.5)

## 2018-03-25 LAB — T4, FREE: Free T4: 0.94 ng/dL (ref 0.60–1.60)

## 2018-03-25 LAB — VITAMIN B12: Vitamin B-12: >1525 pg/mL — ABNORMAL HIGH (ref 211–911)

## 2018-03-25 LAB — TSH: TSH: 0.59 u[IU]/mL (ref 0.35–4.50)

## 2018-03-25 MED ORDER — TRAMADOL HCL 50 MG PO TABS: 50.0000 mg | ORAL_TABLET | Freq: Two times a day (BID) | ORAL | 1 refills | Status: DC | PRN

## 2018-03-25 MED ORDER — TRIAMCINOLONE ACETONIDE 55 MCG/ACT NA AERO: 2.0000 | INHALATION_SPRAY | Freq: Every day | NASAL | 3 refills | Status: DC

## 2018-03-25 NOTE — Patient Instructions (Addendum)
Housing, home repair, transportation, food, resources for caregivers, senior and disability centers, adult day care, long term care options. [email or web address].info/ Can't find the help you need here? Dial 2-1-1 Or [phone], TTY # [phone] CONTACT  If you are looking for programs or services in your community, please dial 2-1-1 or use our online Search Tool.  www.auntbertha.com or down load app on smart phone  [employer]unt Berenice Primas website lists multiple social resources for individuals such as: food, health, money, house hold goods, transit, medical supplies, job training and legal services.  Continue doing brain stimulating activities (puzzles, reading, adult coloring books, staying active) to keep memory sharp.   Continue to eat heart healthy diet (full of fruits, vegetables, whole grains, lean protein, water--limit salt, fat, and sugar intake) and increase physical activity as tolerated.  Health Maintenance, Female Adopting a healthy lifestyle and getting preventive care can go a long way to promote health and wellness. Talk with your health care provider about what schedule of regular examinations is right for you. This is a good chance for you to check in with your provider about disease prevention and staying healthy. In between checkups, there are plenty of things you can do on your own. Experts have done a lot of research about which lifestyle changes and preventive measures are most likely to keep you healthy. Ask your health care provider for more information. Weight and diet Eat a healthy diet  Be sure to include plenty of vegetables, fruits, low-fat dairy products, and lean protein.  Do not eat a lot of foods high in solid fats, added sugars, or salt.  Get regular exercise. This is one of the most important things you can do for your health. ? Most adults should exercise for at least 150 minutes each week. The exercise should increase your heart rate and make you sweat  (moderate-intensity exercise). ? Most adults should also do strengthening exercises at least twice a week. This is in addition to the moderate-intensity exercise. Maintain a healthy weight  Body mass index (BMI) is a measurement that can be used to identify possible weight problems. It estimates body fat based on height and weight. Your health care provider can help determine your BMI and help you achieve or maintain a healthy weight.  For females 105 years of age and older: ? A BMI below 18.5 is considered underweight. ? A BMI of 18.5 to 24.9 is normal. ? A BMI of 25 to 29.9 is considered overweight. ? A BMI of 30 and above is considered obese. Watch levels of cholesterol and blood lipids  You should start having your blood tested for lipids and cholesterol at 80 years of age, then have this test every 5 years.  You may need to have your cholesterol levels checked more often if: ? Your lipid or cholesterol levels are high. ? You are older than 79 years of age. ? You are at high risk for heart disease. Cancer screening Lung Cancer  Lung cancer screening is recommended for adults 67-73 years old who are at high risk for lung cancer because of a history of smoking.  A yearly low-dose CT scan of the lungs is recommended for people who: ? Currently smoke. ? Have quit within the past 15 years. ? Have at least a 30-pack-year history of smoking. A pack year is smoking an average of one pack of cigarettes a day for 1 year.  Yearly screening should continue until it has been 15 years since you quit.  Yearly screening  should stop if you develop a health problem that would prevent you from having lung cancer treatment. Breast Cancer  Practice breast self-awareness. This means understanding how your breasts normally appear and feel.  It also means doing regular breast self-exams. Let your health care provider know about any changes, no matter how small.  If you are in your 20s or 30s, you  should have a clinical breast exam (CBE) by a health care provider every 1-3 years as part of a regular health exam.  If you are 64 or older, have a CBE every year. Also consider having a breast X-ray (mammogram) every year.  If you have a family history of breast cancer, talk to your health care provider about genetic screening.  If you are at high risk for breast cancer, talk to your health care provider about having an MRI and a mammogram every year.  Breast cancer gene (BRCA) assessment is recommended for women who have family members with BRCA-related cancers. BRCA-related cancers include: ? Breast. ? Ovarian. ? Tubal. ? Peritoneal cancers.  Results of the assessment will determine the need for genetic counseling and BRCA1 and BRCA2 testing. Cervical Cancer Your health care provider may recommend that you be screened regularly for cancer of the pelvic organs (ovaries, uterus, and vagina). This screening involves a pelvic examination, including checking for microscopic changes to the surface of your cervix (Pap test). You may be encouraged to have this screening done every 3 years, beginning at age 71.  For women ages 59-65, health care providers may recommend pelvic exams and Pap testing every 3 years, or they may recommend the Pap and pelvic exam, combined with testing for human papilloma virus (HPV), every 5 years. Some types of HPV increase your risk of cervical cancer. Testing for HPV may also be done on women of any age with unclear Pap test results.  Other health care providers may not recommend any screening for nonpregnant women who are considered low risk for pelvic cancer and who do not have symptoms. Ask your health care provider if a screening pelvic exam is right for you.  If you have had past treatment for cervical cancer or a condition that could lead to cancer, you need Pap tests and screening for cancer for at least 20 years after your treatment. If Pap tests have been  discontinued, your risk factors (such as having a new sexual partner) need to be reassessed to determine if screening should resume. Some women have medical problems that increase the chance of getting cervical cancer. In these cases, your health care provider may recommend more frequent screening and Pap tests. Colorectal Cancer  This type of cancer can be detected and often prevented.  Routine colorectal cancer screening usually begins at 80 years of age and continues through 80 years of age.  Your health care provider may recommend screening at an earlier age if you have risk factors for colon cancer.  Your health care provider may also recommend using home test kits to check for hidden blood in the stool.  A small camera at the end of a tube can be used to examine your colon directly (sigmoidoscopy or colonoscopy). This is done to check for the earliest forms of colorectal cancer.  Routine screening usually begins at age 86.  Direct examination of the colon should be repeated every 5-10 years through 80 years of age. However, you may need to be screened more often if early forms of precancerous polyps or small growths are  found. Skin Cancer  Check your skin from head to toe regularly.  Tell your health care provider about any new moles or changes in moles, especially if there is a change in a mole's shape or color.  Also tell your health care provider if you have a mole that is larger than the size of a pencil eraser.  Always use sunscreen. Apply sunscreen liberally and repeatedly throughout the day.  Protect yourself by wearing long sleeves, pants, a wide-brimmed hat, and sunglasses whenever you are outside. Heart disease, diabetes, and high blood pressure  High blood pressure causes heart disease and increases the risk of stroke. High blood pressure is more likely to develop in: ? People who have blood pressure in the high end of the normal range (130-139/85-89 mm Hg). ? People  who are overweight or obese. ? People who are African American.  If you are 54-76 years of age, have your blood pressure checked every 3-5 years. If you are 56 years of age or older, have your blood pressure checked every year. You should have your blood pressure measured twice-once when you are at a hospital or clinic, and once when you are not at a hospital or clinic. Record the average of the two measurements. To check your blood pressure when you are not at a hospital or clinic, you can use: ? An automated blood pressure machine at a pharmacy. ? A home blood pressure monitor.  If you are between 24 years and 32 years old, ask your health care provider if you should take aspirin to prevent strokes.  Have regular diabetes screenings. This involves taking a blood sample to check your fasting blood sugar level. ? If you are at a normal weight and have a low risk for diabetes, have this test once every three years after 80 years of age. ? If you are overweight and have a high risk for diabetes, consider being tested at a younger age or more often. Preventing infection Hepatitis B  If you have a higher risk for hepatitis B, you should be screened for this virus. You are considered at high risk for hepatitis B if: ? You were born in a country where hepatitis B is common. Ask your health care provider which countries are considered high risk. ? Your parents were born in a high-risk country, and you have not been immunized against hepatitis B (hepatitis B vaccine). ? You have HIV or AIDS. ? You use needles to inject street drugs. ? You live with someone who has hepatitis B. ? You have had sex with someone who has hepatitis B. ? You get hemodialysis treatment. ? You take certain medicines for conditions, including cancer, organ transplantation, and autoimmune conditions. Hepatitis C  Blood testing is recommended for: ? Everyone born from [birth year] through 1963. ? Anyone with known risk factors for  hepatitis C. Sexually transmitted infections (STIs)  You should be screened for sexually transmitted infections (STIs) including gonorrhea and chlamydia if: ? You are sexually active and are younger than 80 years of age. ? You are older than 80 years of age and your health care provider tells you that you are at risk for this type of infection. ? Your sexual activity has changed since you were last screened and you are at an increased risk for chlamydia or gonorrhea. Ask your health care provider if you are at risk.  If you do not have HIV, but are at risk, it may be recommended that you take a  prescription medicine daily to prevent HIV infection. This is called pre-exposure prophylaxis (PrEP). You are considered at risk if: ? You are sexually active and do not regularly use condoms or know the HIV status of your partner(s). ? You take drugs by injection. ? You are sexually active with a partner who has HIV. Talk with your health care provider about whether you are at high risk of being infected with HIV. If you choose to begin PrEP, you should first be tested for HIV. You should then be tested every 3 months for as long as you are taking PrEP. Pregnancy  If you are premenopausal and you may become pregnant, ask your health care provider about preconception counseling.  If you may become pregnant, take 400 to 800 micrograms (mcg) of folic acid every day.  If you want to prevent pregnancy, talk to your health care provider about birth control (contraception). Osteoporosis and menopause  Osteoporosis is a disease in which the bones lose minerals and strength with aging. This can result in serious bone fractures. Your risk for osteoporosis can be identified using a bone density scan.  If you are 32 years of age or older, or if you are at risk for osteoporosis and fractures, ask your health care provider if you should be screened.  Ask your health care provider whether you should take a calcium  or vitamin D supplement to lower your risk for osteoporosis.  Menopause may have certain physical symptoms and risks.  Hormone replacement therapy may reduce some of these symptoms and risks. Talk to your health care provider about whether hormone replacement therapy is right for you. Follow these instructions at home:  Schedule regular health, dental, and eye exams.  Stay current with your immunizations.  Do not use any tobacco products including cigarettes, chewing tobacco, or electronic cigarettes.  If you are pregnant, do not drink alcohol.  If you are breastfeeding, limit how much and how often you drink alcohol.  Limit alcohol intake to no more than 1 drink per day for nonpregnant women. One drink equals 12 ounces of beer, 5 ounces of wine, or 1 ounces of hard liquor.  Do not use street drugs.  Do not share needles.  Ask your health care provider for help if you need support or information about quitting drugs.  Tell your health care provider if you often feel depressed.  Tell your health care provider if you have ever been abused or do not feel safe at home. This information is not intended to replace advice given to you by your health care provider. Make sure you discuss any questions you have with your health care provider. Document Released: 10/08/2010 Document Revised: 08/31/2015 Document Reviewed: 12/27/2014 Elsevier Interactive Patient Education  2019 [employer].

## 2018-03-25 NOTE — Assessment & Plan Note (Signed)
Likely msk, but also check B12

## 2018-03-25 NOTE — Patient Instructions (Signed)
Ok to change the Flonase to nasacort  Please continue all other medications as before, and refills have been done if requested - the tramadol  Please have the pharmacy call with any other refills you may need.  Please continue your efforts at being more active, low cholesterol diet, and weight control.  Please keep your appointments with your specialists as you may have planned  Please go to the LAB in the Basement (turn left off the elevator) for the tests to be done today  You will be contacted by phone if any changes need to be made immediately.  Otherwise, you will receive a letter about your results with an explanation, but please check with MyChart first.  Please remember to sign up for MyChart if you have not done so, as this will be important to you in the future with finding out test results, communicating by private email, and scheduling acute appointments online when needed.  Please return in 6 months, or sooner if needed, with Lab testing done 3-5 days before

## 2018-03-25 NOTE — Assessment & Plan Note (Signed)
With recurring nosebleeds, for change flonase to nasacort, consider ENT if not improved

## 2018-03-25 NOTE — Progress Notes (Signed)
Subjective:    Patient ID: Dawn Gomez, female    DOB: 1935/12/02, 82 y.o.   MRN: [id number]  HPI  Here to f/u; overall doing ok,  Pt denies chest pain, increasing sob or doe, wheezing, orthopnea, PND, increased LE swelling, palpitations, dizziness or syncope.  Pt denies new neurological symptoms such as new headache, or facial or extremity weakness or numbness.  Pt denies polydipsia, polyuria, or low sugar episode.  Pt states overall good compliance with meds, mostly trying to follow appropriate diet, with wt overall stable,  but little exercise however.   For some reason did not get lab work done last vist.  Has persistent burning pain to the distal legs without redness or swelling. Has occas leg cramps on the crestor, but tonic water helps. Does have occasional nosebleed on the flonase.  No new complaints Past Medical History:  Diagnosis Date  . Abnormal heart rhythm   . Aortic aneurysm ([place])   . Dyspnea   . Hypothyroid   . OSA (obstructive sleep apnea)    on cpap  . Psoriasis 02/05/2017  . Pulmonary fibrosis ([place])   . SVT (supraventricular tachycardia) ([place])   . Thyroid disease    hypo   Past Surgical History:  Procedure Laterality Date  . ABDOMINAL HYSTERECTOMY    . APPENDECTOMY    . CHOLECYSTECTOMY    . CRANIOTOMY     for aneurysms  . CRANIOTOMY  1980 x 2   aneurysmal clipping  . HERNIA REPAIR    . TOTAL ABDOMINAL HYSTERECTOMY    . TUBAL LIGATION      reports that she quit smoking about 11 years ago. Her smoking use included cigarettes. She has a 100.00 pack-year smoking history. She has never used smokeless tobacco. She reports current alcohol use. She reports that she does not use drugs. family history includes Allergies in her sister; Bone cancer in her father; Breast cancer in her sister; Cancer in her sister; Clotting disorder in her father and sister; Emphysema in her father, mother, and sister; Heart disease in her father and mother; Lung cancer in her father and  mother; Prostate cancer in her father; Rheum arthritis in her father, mother, and sister. Allergies  Allergen Reactions  . Lipitor [Atorvastatin] Shortness Of Breath, Diarrhea and Other (See Comments)    Dizzy, pain all over.  . Codeine   . Hydromorphone Hcl     REACTION: dementia  . Levaquin [Levofloxacin In D5w]     Shock per pt but can take cipro  . Morphine   . Oxycodone-Acetaminophen   . Propoxyphene N-Acetaminophen   . Simvastatin    Current Outpatient Medications on File Prior to Visit  Medication Sig Dispense Refill  . acetaminophen (TYLENOL) 500 MG tablet Take 500 mg by mouth every 6 (six) hours as needed.    . Ascorbic Acid (VITAMIN C PO) Take by mouth.    Marland Kitchen aspirin EC 81 MG tablet Take 81 mg by mouth daily.    . blood glucose meter kit and supplies Dispense based on patient and insurance preference. Use up to four times daily as directed. (FOR ICD-10 E11.9). 1 each 0  . CALCIUM-MAGNESIUM-ZINC PO Take 1 tablet by mouth daily.    . cholecalciferol (VITAMIN D) 1000 units tablet Take 5,000 Units by mouth daily.    Marland Kitchen diltiazem (CARDIZEM CD) 180 MG 24 hr capsule Take 1 capsule (180 mg total) by mouth daily. 90 capsule 1  . escitalopram (LEXAPRO) 20 MG tablet Take 1  tablet (20 mg total) by mouth at bedtime. 90 tablet 3  . fenofibrate 160 MG tablet Take 1 tablet (160 mg total) by mouth daily. 90 tablet 3  . fluocinonide (LIDEX) 0.05 % external solution APP EXT AA OF SCALP NIGHTLY  3  . fluticasone (FLONASE) 50 MCG/ACT nasal spray USE 1 SPRAY IN EACH NOSTRIL TWICE DAILY 48 g 2  . furosemide (LASIX) 80 MG tablet TAKE 1 TABLET (80 MG TOTAL) BY MOUTH DAILY. 90 tablet 0  . ipratropium-albuterol (DUONEB) 0.5-2.5 (3) MG/3ML SOLN Take 3 mLs by nebulization every 4 (four) hours as needed. 360 mL 0  . levothyroxine (SYNTHROID, LEVOTHROID) 100 MCG tablet TAKE 1 TABLET (100 MCG TOTAL) BY MOUTH DAILY BEFORE BREAKFAST. 90 tablet 1  . lisinopril (PRINIVIL,ZESTRIL) 5 MG tablet TAKE 1 TABLET (5 MG  TOTAL) BY MOUTH DAILY. 90 tablet 1  . meloxicam (MOBIC) 7.5 MG tablet Take 1 tablet (7.5 mg total) by mouth daily. 30 tablet 0  . potassium chloride SA (K-DUR,KLOR-CON) 20 MEQ tablet Take 1 tablet (20 mEq) by mouth daily with food 90 tablet 3  . rosuvastatin (CRESTOR) 20 MG tablet TAKE 1 TABLET EVERY DAY 90 tablet 0  . traMADol (ULTRAM) 50 MG tablet Take 1 tablet (50 mg total) by mouth every 12 (twelve) hours as needed. 180 tablet 1  . traZODone (DESYREL) 50 MG tablet TAKE 1 TABLET (50 MG TOTAL) BY MOUTH AT BEDTIME AS NEEDED FOR SLEEP. 90 tablet 1  . vitamin B-12 (CYANOCOBALAMIN) 1000 MCG tablet Take 500 mcg by mouth daily.    . vitamin C (ASCORBIC ACID) 500 MG tablet Take 500 mg by mouth daily.    . Vitamin D, Ergocalciferol, (DRISDOL) 50000 units CAPS capsule Take 1 capsule (50,000 Units total) by mouth every 7 (seven) days. 12 capsule 0  . vitamin E 400 UNIT capsule Take 200 Units by mouth daily.     No current facility-administered medications on file prior to visit.    Review of Systems  Constitutional: Negative for other unusual diaphoresis or sweats HENT: Negative for ear discharge or swelling Eyes: Negative for other worsening visual disturbances Respiratory: Negative for stridor or other swelling  Gastrointestinal: Negative for worsening distension or other blood Genitourinary: Negative for retention or other urinary change Musculoskeletal: Negative for other MSK pain or swelling Skin: Negative for color change or other new lesions Neurological: Negative for worsening tremors and other numbness  Psychiatric/Behavioral: Negative for worsening agitation or other fatigue All other system neg per pt    Objective:   Physical Exam BP 134/78   Pulse 70   Temp 98.3 F (36.8 C) (Oral)   Ht 5' (1.524 m)   Wt 171 lb (77.6 kg)   SpO2 93%   BMI 33.40 kg/m  VS noted, good energy today, non toxic Constitutional: Pt appears in NAD HENT: Head: NCAT.  Right Ear: External ear normal.    Left Ear: External ear normal.  Eyes: . Pupils are equal, round, and reactive to light. Conjunctivae and EOM are normal Nose: without d/c or deformity Neck: Neck supple. Gross normal ROM Cardiovascular: Normal rate and regular rhythm.   Pulmonary/Chest: Effort normal and breath sounds decreased without rales or wheezing.  Abd:  Soft, NT, ND, + BS, no organomegaly Neurological: Pt is alert. At baseline orientation, motor grossly intact Skin: Skin is warm. No rashes, other new lesions, no LE edema Psychiatric: Pt behavior is normal without agitation ' No other exam findings Lab Results  Component Value Date  WBC 9.3 02/05/2017   HGB 12.9 02/05/2017   HCT 37.7 02/05/2017   PLT 321.0 02/05/2017   GLUCOSE 113 (H) 02/05/2017   CHOL 206 (H) 02/05/2017   TRIG 205.0 (H) 02/05/2017   HDL 46.70 02/05/2017   LDLDIRECT 138.0 02/05/2017   ALT 19 02/05/2017   AST 18 02/05/2017   NA 135 02/05/2017   K 4.5 02/05/2017   CL 96 02/05/2017   CREATININE 1.12 02/05/2017   BUN 29 (H) 02/05/2017   CO2 28 02/05/2017   TSH 1.26 02/05/2017   HGBA1C 5.7 02/05/2017       Assessment & Plan:

## 2018-03-25 NOTE — Assessment & Plan Note (Signed)
stable overall by history and exam, recent data reviewed with pt, and pt to continue medical treatment as before,  to f/u any worsening symptoms or concerns  

## 2018-03-25 NOTE — Assessment & Plan Note (Signed)
[name] for f/u lab today

## 2018-03-25 NOTE — Addendum Note (Signed)
Addended by: Biagio Borg on: 03/25/2018 02:04 PM   Modules accepted: Orders

## 2018-04-10 ENCOUNTER — Encounter: Payer: Self-pay | Admitting: Internal Medicine

## 2018-04-10 ENCOUNTER — Ambulatory Visit (INDEPENDENT_AMBULATORY_CARE_PROVIDER_SITE_OTHER): Payer: PPO | Admitting: Internal Medicine

## 2018-04-10 VITALS — BP 128/78 | HR 83 | Temp 97.9°F | Ht 60.0 in | Wt 172.0 lb

## 2018-04-10 DIAGNOSIS — R739 Hyperglycemia, unspecified: Secondary | ICD-10-CM | POA: Diagnosis not present

## 2018-04-10 DIAGNOSIS — M940 Chondrocostal junction syndrome [Tietze]: Secondary | ICD-10-CM | POA: Diagnosis not present

## 2018-04-10 DIAGNOSIS — J9611 Chronic respiratory failure with hypoxia: Secondary | ICD-10-CM | POA: Diagnosis not present

## 2018-04-10 MED ORDER — TRAMADOL HCL ER 200 MG PO TB24: 200.0000 mg | ORAL_TABLET | Freq: Every day | ORAL | 5 refills | Status: DC

## 2018-04-10 NOTE — Patient Instructions (Signed)
Please take all new medication as prescribed - the tramadol ER 200 mg per day for pain and cough  Please continue all other medications as before, and refills have been done if requested.  Please have the pharmacy call with any other refills you may need.  Please continue your efforts at being more active, low cholesterol diet, and weight control.  Please keep your appointments with your specialists as you may have planned

## 2018-04-10 NOTE — Progress Notes (Signed)
Subjective:    Patient ID: Dawn Gomez, female    DOB: 1936/01/21, 83 y.o.   MRN: [id number]  HPI   Here to f/u with c/o lower bilat costal margin area pain left > right, 2 wks, intermittent, sharp and dull, without other radiation or assoc symtpoms.  Act of coughing leads to severe pain temporarily.  Is s/p mesh with prior surgury and wondering if relatred.  Pt denies other chest pain, increased sob or doe, wheezing, orthopnea, PND, increased LE swelling, palpitations, dizziness or syncope.   Pt denies fever, wt loss, night sweats, loss of appetite, or other constitutional symptoms  Pt denies new neurological symptoms such as new headache, or facial or extremity weakness or numbness   Pt denies polydipsia, polyuria. Past Medical History:  Diagnosis Date  . Abnormal heart rhythm   . Aortic aneurysm ([place])   . Dyspnea   . Hypothyroid   . OSA (obstructive sleep apnea)    on cpap  . Psoriasis 02/05/2017  . Pulmonary fibrosis ([place])   . SVT (supraventricular tachycardia) ([place])   . Thyroid disease    hypo   Past Surgical History:  Procedure Laterality Date  . ABDOMINAL HYSTERECTOMY    . APPENDECTOMY    . CHOLECYSTECTOMY    . CRANIOTOMY     for aneurysms  . CRANIOTOMY  1980 x 2   aneurysmal clipping  . HERNIA REPAIR    . TOTAL ABDOMINAL HYSTERECTOMY    . TUBAL LIGATION      reports that she quit smoking about 12 years ago. Her smoking use included cigarettes. She has a 100.00 pack-year smoking history. She has never used smokeless tobacco. She reports current alcohol use. She reports that she does not use drugs. family history includes Allergies in her sister; Bone cancer in her father; Breast cancer in her sister; Cancer in her sister; Clotting disorder in her father and sister; Emphysema in her father, mother, and sister; Heart disease in her father and mother; Lung cancer in her father and mother; Prostate cancer in her father; Rheum arthritis in her father, mother, and  sister. Allergies  Allergen Reactions  . Lipitor [Atorvastatin] Shortness Of Breath, Diarrhea and Other (See Comments)    Dizzy, pain all over.  . Codeine   . Hydromorphone Hcl     REACTION: dementia  . Levaquin [Levofloxacin In D5w]     Shock per pt but can take cipro  . Morphine   . Oxycodone-Acetaminophen   . Propoxyphene N-Acetaminophen   . Simvastatin    Current Outpatient Medications on File Prior to Visit  Medication Sig Dispense Refill  . acetaminophen (TYLENOL) 500 MG tablet Take 500 mg by mouth every 6 (six) hours as needed.    . Ascorbic Acid (VITAMIN C PO) Take by mouth.    Marland Kitchen aspirin EC 81 MG tablet Take 81 mg by mouth daily.    . blood glucose meter kit and supplies Dispense based on patient and insurance preference. Use up to four times daily as directed. (FOR ICD-10 E11.9). 1 each 0  . CALCIUM-MAGNESIUM-ZINC PO Take 1 tablet by mouth daily.    . cholecalciferol (VITAMIN D) 1000 units tablet Take 5,000 Units by mouth daily.    Marland Kitchen diltiazem (CARDIZEM CD) 180 MG 24 hr capsule Take 1 capsule (180 mg total) by mouth daily. 90 capsule 1  . escitalopram (LEXAPRO) 20 MG tablet Take 1 tablet (20 mg total) by mouth at bedtime. 90 tablet 3  . fenofibrate 160 MG  tablet Take 1 tablet (160 mg total) by mouth daily. 90 tablet 3  . fluocinonide (LIDEX) 0.05 % external solution APP EXT AA OF SCALP NIGHTLY  3  . furosemide (LASIX) 80 MG tablet TAKE 1 TABLET (80 MG TOTAL) BY MOUTH DAILY. 90 tablet 0  . ipratropium-albuterol (DUONEB) 0.5-2.5 (3) MG/3ML SOLN Take 3 mLs by nebulization every 4 (four) hours as needed. 360 mL 0  . levothyroxine (SYNTHROID, LEVOTHROID) 100 MCG tablet TAKE 1 TABLET (100 MCG TOTAL) BY MOUTH DAILY BEFORE BREAKFAST. 90 tablet 1  . lisinopril (PRINIVIL,ZESTRIL) 5 MG tablet TAKE 1 TABLET (5 MG TOTAL) BY MOUTH DAILY. 90 tablet 1  . meloxicam (MOBIC) 7.5 MG tablet Take 1 tablet (7.5 mg total) by mouth daily. 30 tablet 0  . potassium chloride SA (K-DUR,KLOR-CON) 20 MEQ  tablet Take 1 tablet (20 mEq) by mouth daily with food 90 tablet 3  . rosuvastatin (CRESTOR) 20 MG tablet TAKE 1 TABLET EVERY DAY 90 tablet 0  . traZODone (DESYREL) 50 MG tablet TAKE 1 TABLET (50 MG TOTAL) BY MOUTH AT BEDTIME AS NEEDED FOR SLEEP. 90 tablet 1  . triamcinolone (NASACORT) 55 MCG/ACT AERO nasal inhaler Place 2 sprays into the nose daily. 3 Inhaler 3  . vitamin B-12 (CYANOCOBALAMIN) 1000 MCG tablet Take 500 mcg by mouth daily.    . vitamin C (ASCORBIC ACID) 500 MG tablet Take 500 mg by mouth daily.    . Vitamin D, Ergocalciferol, (DRISDOL) 50000 units CAPS capsule Take 1 capsule (50,000 Units total) by mouth every 7 (seven) days. 12 capsule 0  . vitamin E 400 UNIT capsule Take 200 Units by mouth daily.     No current facility-administered medications on file prior to visit.    Review of Systems  Constitutional: Negative for other unusual diaphoresis or sweats HENT: Negative for ear discharge or swelling Eyes: Negative for other worsening visual disturbances Respiratory: Negative for stridor or other swelling  Gastrointestinal: Negative for worsening distension or other blood Genitourinary: Negative for retention or other urinary change Musculoskeletal: Negative for other MSK pain or swelling Skin: Negative for color change or other new lesions Neurological: Negative for worsening tremors and other numbness  Psychiatric/Behavioral: Negative for worsening agitation or other fatigue All other system neg per pt    Objective:   Physical Exam BP 128/78   Pulse 83   Temp 97.9 F (36.6 C) (Oral)   Ht 5' (1.524 m)   Wt 172 lb (78 kg)   SpO2 90%   BMI 33.59 kg/m  VS noted, not ill appearing Constitutional: Pt appears in NAD HENT: Head: NCAT.  Right Ear: External ear normal.  Left Ear: External ear normal.  Eyes: . Pupils are equal, round, and reactive to light. Conjunctivae and EOM are normal Nose: without d/c or deformity Neck: Neck supple. Gross normal  ROM Cardiovascular: Normal rate and regular rhythm.   Pulmonary/Chest: Effort normal and breath sounds decreased without rales or wheezing.  Abd:  Soft, NT, ND, + BS, no organomegaly but has mild to mod tender to bilateral costal margins left > right without swelling or rash Neurological: Pt is alert. At baseline orientation, motor grossly intact Skin: Skin is warm. No rashes, other new lesions, no LE edema Psychiatric: Pt behavior is normal without agitation  No other exam findings  Lab Results  Component Value Date   WBC 6.1 03/25/2018   HGB 12.4 03/25/2018   HCT 37.1 03/25/2018   PLT 268.0 03/25/2018   GLUCOSE 103 (H) 03/25/2018  CHOL 123 03/25/2018   TRIG 132.0 03/25/2018   HDL 48.60 03/25/2018   LDLDIRECT 138.0 02/05/2017   LDLCALC 48 03/25/2018   ALT 19 03/25/2018   AST 17 03/25/2018   NA 133 (L) 03/25/2018   K 4.7 03/25/2018   CL 97 03/25/2018   CREATININE 0.88 03/25/2018   BUN 16 03/25/2018   CO2 28 03/25/2018   TSH 0.59 03/25/2018   HGBA1C 5.5 03/25/2018       Assessment & Plan:

## 2018-04-11 ENCOUNTER — Encounter: Payer: Self-pay | Admitting: Internal Medicine

## 2018-04-11 NOTE — Assessment & Plan Note (Signed)
With chronic recurring cough, should also improve with tramadol as above

## 2018-04-11 NOTE — Assessment & Plan Note (Signed)
D/w pt, declines ecg and cxr, for change tramadol 50 bid with intermittent pain control to tramadol ER 200 qd

## 2018-04-11 NOTE — Assessment & Plan Note (Signed)
stable overall by history and exam, recent data reviewed with pt, and pt to continue medical treatment as before,  to f/u any worsening symptoms or concerns  

## 2018-04-13 ENCOUNTER — Telehealth: Payer: Self-pay | Admitting: Pulmonary Disease

## 2018-04-13 NOTE — Telephone Encounter (Signed)
Spoke with pt, wants to know if she would be able to be cleared for a colonoscopy.  Pt does not have one scheduled at this time, but is concerned d/t her regular O2 use and fibrosis if she is a candidate for the anesthesia needed for the procedure.  Pt willing to come in for an appt if necessary for clearance.  BQ please advise if pt would be cleared for colonoscopy.  Thanks!

## 2018-04-14 NOTE — Telephone Encounter (Signed)
Pt is calling back [phone]

## 2018-04-14 NOTE — Telephone Encounter (Signed)
ATC pt, no answer. Left message for pt to call back.  

## 2018-04-14 NOTE — Telephone Encounter (Signed)
Yes but should be performed in hospital setting, not in an office

## 2018-04-14 NOTE — Telephone Encounter (Signed)
Called pt and advised message from the provider. Pt understood and verbalized understanding. Nothing further is needed.    

## 2018-04-22 ENCOUNTER — Telehealth: Payer: Self-pay

## 2018-04-22 NOTE — Telephone Encounter (Signed)
Spoke with patient for clarification. She stated that it is [facility], clinical by [facility]. I have updated pharmacy in chart.   Copied from [place] [phone]. Topic: General - Other >> Apr 22, 2018  9:34 AM Keene Breath wrote: Reason for CRM: Patient called to inform the office that she would like her medications sent through [facility], Tel# [phone] in the future.  This is a service that will deliver her medications so she does not have to pick them up.  If there are any questions, please call patient at [phone]

## 2018-04-24 ENCOUNTER — Telehealth: Payer: Self-pay | Admitting: Pulmonary Disease

## 2018-04-24 NOTE — Telephone Encounter (Signed)
Caryl Pina look out for the [employer] form.

## 2018-04-28 NOTE — Telephone Encounter (Signed)
Dawn Gomez has this form been received? Thanks!

## 2018-04-28 NOTE — Telephone Encounter (Signed)
I still have not received this form in the mail.  Will continue to hold in my inbasket until it is received.

## 2018-05-01 NOTE — Telephone Encounter (Signed)
Caryl Pina please advise if this has been received? If not, triage will reach out to the pt. Thanks.

## 2018-05-04 NOTE — Telephone Encounter (Signed)
These forms still have not been received.

## 2018-05-05 NOTE — Telephone Encounter (Signed)
Spoke with pt. She is aware that we have not received these forms. Pt is going to contact [employer] and get more forms and bring them to our office. Nothing further was needed at this time.

## 2018-05-07 ENCOUNTER — Telehealth: Payer: Self-pay

## 2018-05-07 DIAGNOSIS — Z1211 Encounter for screening for malignant neoplasm of colon: Secondary | ICD-10-CM

## 2018-05-07 NOTE — Addendum Note (Signed)
Addended by: Biagio Borg on: 05/07/2018 12:07 PM   Modules accepted: Orders

## 2018-05-07 NOTE — Telephone Encounter (Signed)
Ok, this is done 

## 2018-05-07 NOTE — Telephone Encounter (Signed)
Copied from [place] [phone]. Topic: General - Other >> May 07, 2018 10:13 AM Leward Quan A wrote: Reason for CRM: Patient called to say that she need to have a Colonoscopy done and need to be referred to GI for this test. Stated that Dr Earlean Shawl last completed her procedure 10 years ago and now its due again. Stated she got the notification from medicare that it is due.  Ph# [phone]

## 2018-05-11 ENCOUNTER — Telehealth: Payer: Self-pay | Admitting: Internal Medicine

## 2018-05-11 MED ORDER — TRAZODONE HCL 50 MG PO TABS: 50.0000 mg | ORAL_TABLET | Freq: Every evening | ORAL | 1 refills | Status: DC | PRN

## 2018-05-11 MED ORDER — FENOFIBRATE 160 MG PO TABS: 160.0000 mg | ORAL_TABLET | Freq: Every day | ORAL | 3 refills | Status: DC

## 2018-05-11 MED ORDER — ROSUVASTATIN CALCIUM 20 MG PO TABS: 20.0000 mg | ORAL_TABLET | Freq: Every day | ORAL | 3 refills | Status: DC

## 2018-05-11 MED ORDER — TRAMADOL HCL 50 MG PO TABS: 50.0000 mg | ORAL_TABLET | Freq: Two times a day (BID) | ORAL | 1 refills | Status: DC

## 2018-05-11 NOTE — Telephone Encounter (Signed)
All done erx 

## 2018-05-11 NOTE — Telephone Encounter (Signed)
Copied from [place] [phone]. Topic: Quick Communication - See Telephone Encounter >> May 11, 2018  9:37 AM Loma Boston wrote: CRM for notification. See Telephone encounter for: 05/11/18.need Refills needed for [facility] Please DO NOT SEND TO [facility] .fenofibrate 160 MG tablet, traZODone (DESYREL) 50 MG tablet ,rosuvastatin (CRESTOR) 20 MG tablet Lastly with special instructions...traMADol (ULTRAM ER) 200 MG 24 hr tablet Patient is requesting Dr Dawn Gomez to back back down to 50 mg twice or three times a day. She cannot take the 200mg , please lower that dosage. Recap Pt needs all med refills with a new script because they were being sent to [facility], back to sending to [facility] goes by [facility] and they are requesting new scripts 1 [phone] Fax or call # [phone]

## 2018-05-11 NOTE — Addendum Note (Signed)
Addended by: Biagio Borg on: 05/11/2018 06:55 PM   Modules accepted: Orders

## 2018-05-14 DIAGNOSIS — J449 Chronic obstructive pulmonary disease, unspecified: Secondary | ICD-10-CM | POA: Diagnosis not present

## 2018-05-14 DIAGNOSIS — J9610 Chronic respiratory failure, unspecified whether with hypoxia or hypercapnia: Secondary | ICD-10-CM | POA: Diagnosis not present

## 2018-05-14 DIAGNOSIS — G4733 Obstructive sleep apnea (adult) (pediatric): Secondary | ICD-10-CM | POA: Diagnosis not present

## 2018-05-14 DIAGNOSIS — J8410 Pulmonary fibrosis, unspecified: Secondary | ICD-10-CM | POA: Diagnosis not present

## 2018-05-14 DIAGNOSIS — J9621 Acute and chronic respiratory failure with hypoxia: Secondary | ICD-10-CM | POA: Diagnosis not present

## 2018-06-09 ENCOUNTER — Ambulatory Visit: Payer: PPO | Admitting: Pulmonary Disease

## 2018-06-12 DIAGNOSIS — J9621 Acute and chronic respiratory failure with hypoxia: Secondary | ICD-10-CM | POA: Diagnosis not present

## 2018-06-12 DIAGNOSIS — G4733 Obstructive sleep apnea (adult) (pediatric): Secondary | ICD-10-CM | POA: Diagnosis not present

## 2018-06-12 DIAGNOSIS — J449 Chronic obstructive pulmonary disease, unspecified: Secondary | ICD-10-CM | POA: Diagnosis not present

## 2018-06-12 DIAGNOSIS — J8410 Pulmonary fibrosis, unspecified: Secondary | ICD-10-CM | POA: Diagnosis not present

## 2018-07-03 ENCOUNTER — Telehealth: Payer: Self-pay

## 2018-07-03 NOTE — Telephone Encounter (Signed)
Pharmacy has been updated.  Copied from [place] [phone]. Topic: General - Other >> Jul 02, 2018  9:05 AM Keene Breath wrote: Reason for CRM: Patient called to inform the office and doctor that her insurance has changed to [facility].  Please put in patient's records.  If there are any questions, please call patient at [phone]

## 2018-07-08 ENCOUNTER — Other Ambulatory Visit: Payer: Self-pay | Admitting: Internal Medicine

## 2018-07-08 MED ORDER — LISINOPRIL 5 MG PO TABS: 5.0000 mg | ORAL_TABLET | Freq: Every day | ORAL | 1 refills | Status: DC

## 2018-07-08 MED ORDER — FUROSEMIDE 80 MG PO TABS: 80.0000 mg | ORAL_TABLET | Freq: Every day | ORAL | 1 refills | Status: DC

## 2018-07-08 NOTE — Telephone Encounter (Addendum)
Pt left message on general voicemail that she would like refills sent to [facility] for lasix and lisinopril; she states that she is out, and she would like a call back; the pt states that the medication can be called into [phone] or faxed to [phone].

## 2018-07-08 NOTE — Addendum Note (Signed)
Addended by: Juliet Rude on: 07/08/2018 12:56 PM   Modules accepted: Orders

## 2018-07-13 DIAGNOSIS — J8410 Pulmonary fibrosis, unspecified: Secondary | ICD-10-CM | POA: Diagnosis not present

## 2018-07-13 DIAGNOSIS — G4733 Obstructive sleep apnea (adult) (pediatric): Secondary | ICD-10-CM | POA: Diagnosis not present

## 2018-07-13 DIAGNOSIS — J9621 Acute and chronic respiratory failure with hypoxia: Secondary | ICD-10-CM | POA: Diagnosis not present

## 2018-07-13 DIAGNOSIS — J449 Chronic obstructive pulmonary disease, unspecified: Secondary | ICD-10-CM | POA: Diagnosis not present

## 2018-07-27 ENCOUNTER — Telehealth: Payer: Self-pay | Admitting: Internal Medicine

## 2018-07-27 NOTE — Telephone Encounter (Signed)
Called and left message with patient to return call to clinic to address message that was sent this morning.

## 2018-07-27 NOTE — Telephone Encounter (Signed)
Patient called very upset that no one has called her back. Per chart I do not see any documented calls. Patient wants the provider or his CMA to call her back she would like to discuss some information with them. [phone] (mobile)

## 2018-07-27 NOTE — Telephone Encounter (Signed)
Patient left a voicemail on the refill line that she sees someone left a voicemail but she is unable to play the voicemail's because her phone is messed up at the moment.

## 2018-07-28 ENCOUNTER — Telehealth (INDEPENDENT_AMBULATORY_CARE_PROVIDER_SITE_OTHER): Payer: Medicare HMO | Admitting: Internal Medicine

## 2018-07-28 ENCOUNTER — Ambulatory Visit: Payer: PPO | Admitting: Internal Medicine

## 2018-07-28 ENCOUNTER — Ambulatory Visit: Payer: PPO | Admitting: Pulmonary Disease

## 2018-07-28 DIAGNOSIS — R509 Fever, unspecified: Secondary | ICD-10-CM

## 2018-07-28 MED ORDER — LISINOPRIL 5 MG PO TABS: 5.0000 mg | ORAL_TABLET | Freq: Every day | ORAL | 3 refills | Status: DC

## 2018-07-28 MED ORDER — FLUCONAZOLE 150 MG PO TABS: ORAL_TABLET | ORAL | 1 refills | Status: DC

## 2018-07-28 MED ORDER — AMOXICILLIN-POT CLAVULANATE 875-125 MG PO TABS: 1.0000 | ORAL_TABLET | Freq: Two times a day (BID) | ORAL | 0 refills | Status: DC

## 2018-07-28 MED ORDER — FENOFIBRATE 160 MG PO TABS: 160.0000 mg | ORAL_TABLET | Freq: Every day | ORAL | 3 refills | Status: DC

## 2018-07-28 MED ORDER — ROSUVASTATIN CALCIUM 20 MG PO TABS: 20.0000 mg | ORAL_TABLET | Freq: Every day | ORAL | 3 refills | Status: DC

## 2018-07-28 MED ORDER — FUROSEMIDE 80 MG PO TABS: 80.0000 mg | ORAL_TABLET | Freq: Every day | ORAL | 3 refills | Status: DC

## 2018-07-28 MED ORDER — LEVOTHYROXINE SODIUM 100 MCG PO TABS: 100.0000 ug | ORAL_TABLET | Freq: Every day | ORAL | 3 refills | Status: DC

## 2018-07-28 NOTE — Telephone Encounter (Signed)
Pt is on a number of meds  Ok for shirron to determine which meds to [facility] she needs , and send as per office refill policy  OK for virtual visit for ? Sinus infection

## 2018-07-28 NOTE — Addendum Note (Signed)
Addended by: Juliet Rude on: 07/28/2018 08:37 AM   Modules accepted: Orders

## 2018-07-28 NOTE — Telephone Encounter (Signed)
Ok, anytime is fine

## 2018-07-28 NOTE — Telephone Encounter (Signed)
She is scheduled for today at 3.20pm

## 2018-07-28 NOTE — Telephone Encounter (Signed)
Pt does not have video capabilities but will do phone visit.

## 2018-07-28 NOTE — Telephone Encounter (Signed)
Cumulative time during 7-day interval 12 min, there was not an associated office visit for this concern within a 7 day period.  Verbal consent for services obtained from patient prior to services given.  Names of all persons present for services: Cathlean Cower, MD, patient Chief complaint: fever and sinus pain  History, background, results pertinent:    Here with 2-3 days acute onset fever, facial pain, pressure, headache, general weakness and malaise, and greenish d/c, with mild ST and cough, but pt denies chest pain, wheezing, increased sob or doe, orthopnea, PND, increased LE swelling, palpitations, dizziness or syncope.  Pt denies new neurological symptoms such as new headache, or facial or extremity weakness or numbness   Pt denies polydipsia, polyuria  Past Medical History:  Diagnosis Date  . Abnormal heart rhythm   . Aortic aneurysm ([place])   . Dyspnea   . Hypothyroid   . OSA (obstructive sleep apnea)    on cpap  . Psoriasis 02/05/2017  . Pulmonary fibrosis ([place])   . SVT (supraventricular tachycardia) ([place])   . Thyroid disease    hypo    Current Outpatient Medications on File Prior to Visit  Medication Sig Dispense Refill  . acetaminophen (TYLENOL) 500 MG tablet Take 500 mg by mouth every 6 (six) hours as needed.    . Ascorbic Acid (VITAMIN C PO) Take by mouth.    Marland Kitchen aspirin EC 81 MG tablet Take 81 mg by mouth daily.    . blood glucose meter kit and supplies Dispense based on patient and insurance preference. Use up to four times daily as directed. (FOR ICD-10 E11.9). 1 each 0  . CALCIUM-MAGNESIUM-ZINC PO Take 1 tablet by mouth daily.    . cholecalciferol (VITAMIN D) 1000 units tablet Take 5,000 Units by mouth daily.    Marland Kitchen diltiazem (CARDIZEM CD) 180 MG 24 hr capsule Take 1 capsule (180 mg total) by mouth daily. 90 capsule 1  . escitalopram (LEXAPRO) 20 MG tablet Take 1 tablet (20 mg total) by mouth at bedtime. 90 tablet 3  . fenofibrate 160 MG tablet Take 1 tablet (160 mg total)  by mouth daily. 90 tablet 3  . fluocinonide (LIDEX) 0.05 % external solution APP EXT AA OF SCALP NIGHTLY  3  . furosemide (LASIX) 80 MG tablet Take 1 tablet (80 mg total) by mouth daily. 90 tablet 3  . ipratropium-albuterol (DUONEB) 0.5-2.5 (3) MG/3ML SOLN Take 3 mLs by nebulization every 4 (four) hours as needed. 360 mL 0  . levothyroxine (SYNTHROID) 100 MCG tablet Take 1 tablet (100 mcg total) by mouth daily before breakfast. 90 tablet 3  . lisinopril (ZESTRIL) 5 MG tablet Take 1 tablet (5 mg total) by mouth daily. 90 tablet 3  . meloxicam (MOBIC) 7.5 MG tablet Take 1 tablet (7.5 mg total) by mouth daily. 30 tablet 0  . potassium chloride SA (K-DUR,KLOR-CON) 20 MEQ tablet Take 1 tablet (20 mEq) by mouth daily with food 90 tablet 3  . rosuvastatin (CRESTOR) 20 MG tablet Take 1 tablet (20 mg total) by mouth daily. 90 tablet 3  . traMADol (ULTRAM) 50 MG tablet Take 1 tablet (50 mg total) by mouth 2 (two) times daily. 180 tablet 1  . traZODone (DESYREL) 50 MG tablet Take 1 tablet (50 mg total) by mouth at bedtime as needed for sleep. 90 tablet 1  . triamcinolone (NASACORT) 55 MCG/ACT AERO nasal inhaler Place 2 sprays into the nose daily. 3 Inhaler 3  . vitamin B-12 (CYANOCOBALAMIN) 1000 MCG tablet Take  500 mcg by mouth daily.    . vitamin C (ASCORBIC ACID) 500 MG tablet Take 500 mg by mouth daily.    . Vitamin D, Ergocalciferol, (DRISDOL) 50000 units CAPS capsule Take 1 capsule (50,000 Units total) by mouth every 7 (seven) days. 12 capsule 0  . vitamin E 400 UNIT capsule Take 200 Units by mouth daily.     No current facility-administered medications on file prior to visit.    Lab Results  Component Value Date   WBC 6.1 03/25/2018   HGB 12.4 03/25/2018   HCT 37.1 03/25/2018   PLT 268.0 03/25/2018   GLUCOSE 103 (H) 03/25/2018   CHOL 123 03/25/2018   TRIG 132.0 03/25/2018   HDL 48.60 03/25/2018   LDLDIRECT 138.0 02/05/2017   LDLCALC 48 03/25/2018   ALT 19 03/25/2018   AST 17 03/25/2018    NA 133 (L) 03/25/2018   K 4.7 03/25/2018   CL 97 03/25/2018   CREATININE 0.88 03/25/2018   BUN 16 03/25/2018   CO2 28 03/25/2018   TSH 0.59 03/25/2018   HGBA1C 5.5 03/25/2018    A/P/next steps: Augmentin bid x 14 days, and diflucan prn;  to f/u any worsening symptoms or concerns

## 2018-07-28 NOTE — Telephone Encounter (Signed)
Pt stated that she has switched back to [facility] mail order pharmacy and would like her medications sent there.   She also said that she has some green mucous with a sinus infection and would like to know if an antibiotic could be called in. I informed her that a phone/video visit might be required. She expressed understanding.

## 2018-07-31 ENCOUNTER — Other Ambulatory Visit: Payer: Self-pay | Admitting: Internal Medicine

## 2018-08-04 NOTE — Telephone Encounter (Signed)
This would be VERY unusual since the augmentin is normally a 90 day rx , and most patients that take this need it asap, such as the same day; so I am not sure why she wants sent to [facility] unless she is adamant and is sure they can send immediately (normally take 7-10 days to get med by mail)

## 2018-08-04 NOTE — Telephone Encounter (Signed)
Is this ok to switch?

## 2018-08-04 NOTE — Telephone Encounter (Signed)
Pt has been informed that antibiotics aren't normally prescribed for 90-days and in this case its a short course of treatment so it was sent to the local pharmacy. She expressed understanding and will have someone pick it up for her.

## 2018-08-04 NOTE — Telephone Encounter (Signed)
Patient  Needs amoxicillin-clavulanate (AUGMENTIN) 875-125 MG tablet sent to [facility] asap because it was sent to [facility] which is incorrect pharmacy. Patient would like a call once sent to [facility].

## 2018-08-12 DIAGNOSIS — J449 Chronic obstructive pulmonary disease, unspecified: Secondary | ICD-10-CM | POA: Diagnosis not present

## 2018-08-12 DIAGNOSIS — J8410 Pulmonary fibrosis, unspecified: Secondary | ICD-10-CM | POA: Diagnosis not present

## 2018-08-12 DIAGNOSIS — G4733 Obstructive sleep apnea (adult) (pediatric): Secondary | ICD-10-CM | POA: Diagnosis not present

## 2018-08-12 DIAGNOSIS — J9621 Acute and chronic respiratory failure with hypoxia: Secondary | ICD-10-CM | POA: Diagnosis not present

## 2018-08-14 ENCOUNTER — Other Ambulatory Visit: Payer: Self-pay | Admitting: Internal Medicine

## 2018-08-14 NOTE — Telephone Encounter (Signed)
Copied from [place] [phone]. Topic: Quick Communication - Rx Refill/Question >> Aug 14, 2018  6:56 PM Nils Flack, Marland Kitchen wrote: Medication: diltiazem (CARDIZEM CD) 180 MG 24 hr capsule, escitalopram (LEXAPRO) 20 MG tablet, potassium chloride SA (K-DUR,KLOR-CON) 20 MEQ tablet,  traMADol (ULTRAM) 50 MG tablet Has the patient contacted their pharmacy? Yes.   (Agent: If no, request that the patient contact the pharmacy for the refill.) (Agent: If yes, when and what did the pharmacy advise?)  Preferred Pharmacy (with phone number or street name): [facility] [phone]  Agent: Please be advised that RX refills may take up to 3 business days. We ask that you follow-up with your pharmacy.

## 2018-08-17 MED ORDER — ESCITALOPRAM OXALATE 20 MG PO TABS: 20.0000 mg | ORAL_TABLET | Freq: Every day | ORAL | 0 refills | Status: DC

## 2018-08-17 MED ORDER — POTASSIUM CHLORIDE CRYS ER 20 MEQ PO TBCR: EXTENDED_RELEASE_TABLET | ORAL | 0 refills | Status: DC

## 2018-08-17 MED ORDER — DILTIAZEM HCL ER COATED BEADS 180 MG PO CP24: 120.0000 mg | ORAL_CAPSULE | Freq: Every day | ORAL | 0 refills | Status: DC

## 2018-08-17 MED ORDER — TRAMADOL HCL 50 MG PO TABS: 50.0000 mg | ORAL_TABLET | Freq: Two times a day (BID) | ORAL | 1 refills | Status: DC

## 2018-08-17 NOTE — Telephone Encounter (Signed)
Done erx 

## 2018-08-17 NOTE — Telephone Encounter (Signed)
All maintenance meds sent.  Controlled Database checked: Tramadol last RF: 05/13/18 # 180

## 2018-08-20 ENCOUNTER — Telehealth: Payer: Self-pay | Admitting: Internal Medicine

## 2018-08-20 NOTE — Telephone Encounter (Signed)
Copied from [place] [phone]. Topic: Quick Communication - Rx Refill/Question >> Aug 20, 2018  2:48 PM Margot Ables wrote: Medication: amoxicillin-clavulanate (AUGMENTIN) 875-125 MG tablet - medication was prescribed 2/day for 10 days on 07/28/2018 - pt completed the medication but still blowing out green and still having stiffness in back of her neck from infection/mucus per pt - she notes sinus pressure as well as congestion - pt noted she is on oxygen too - she is requesting a refill on ABX.   Has the patient contacted their pharmacy? No - no refills - pt states she needs a stronger antibiotic  Preferred Pharmacy (with phone number or street name): [place], [place] - [address] [phone] (Phone) [phone] (Fax)

## 2018-08-20 NOTE — Telephone Encounter (Signed)
Is this refill appropriate?  

## 2018-08-27 ENCOUNTER — Ambulatory Visit: Payer: Medicare HMO | Admitting: Internal Medicine

## 2018-08-27 NOTE — Telephone Encounter (Signed)
Patient scheduled.

## 2018-08-27 NOTE — Telephone Encounter (Signed)
Sorry no that would not be adequate in her case as she is complicated and already seems to have failed an antibiotic

## 2018-08-27 NOTE — Telephone Encounter (Signed)
Patient has called back in regard?

## 2018-08-27 NOTE — Telephone Encounter (Signed)
Patient has scheduled a 1pm phone visit.  If that is ok?

## 2018-08-27 NOTE — Telephone Encounter (Signed)
Sorry, no pt would need inperson if possible visit for further symptoms

## 2018-08-28 ENCOUNTER — Encounter: Payer: Self-pay | Admitting: Internal Medicine

## 2018-08-28 ENCOUNTER — Ambulatory Visit (INDEPENDENT_AMBULATORY_CARE_PROVIDER_SITE_OTHER): Payer: Medicare HMO | Admitting: Internal Medicine

## 2018-08-28 ENCOUNTER — Other Ambulatory Visit: Payer: Self-pay

## 2018-08-28 VITALS — BP 116/72 | HR 69 | Ht 60.0 in | Wt 169.0 lb

## 2018-08-28 DIAGNOSIS — R739 Hyperglycemia, unspecified: Secondary | ICD-10-CM | POA: Diagnosis not present

## 2018-08-28 DIAGNOSIS — J329 Chronic sinusitis, unspecified: Secondary | ICD-10-CM | POA: Diagnosis not present

## 2018-08-28 DIAGNOSIS — J9611 Chronic respiratory failure with hypoxia: Secondary | ICD-10-CM

## 2018-08-28 MED ORDER — DOXYCYCLINE HYCLATE 100 MG PO TABS: 100.0000 mg | ORAL_TABLET | Freq: Two times a day (BID) | ORAL | 0 refills | Status: DC

## 2018-08-28 NOTE — Patient Instructions (Signed)
Please take all new medication as prescribed - the antibiotic  Please continue all other medications as before, and refills have been done if requested.  Please have the pharmacy call with any other refills you may need.  Please continue your efforts at being more active, low cholesterol diet, and weight control.  Please keep your appointments with your specialists as you may have planned

## 2018-08-28 NOTE — Progress Notes (Signed)
Subjective:    Patient ID: Dawn Gomez, female    DOB: Apr 07, 1936, 83 y.o.   MRN: [id number]  HPI   Here with 2-3 days acute onset fever, facial pain, pressure, headache, general weakness and malaise, and greenish d/c, with mild ST and cough, but pt denies chest pain, wheezing, increased sob or doe, orthopnea, PND, increased LE swelling, palpitations, dizziness or syncope.   Pt denies polydipsia, polyuria. Denies worsening depressive symptoms, suicidal ideation, or panic Past Medical History:  Diagnosis Date  . Abnormal heart rhythm   . Aortic aneurysm ([place])   . Dyspnea   . Hypothyroid   . OSA (obstructive sleep apnea)    on cpap  . Psoriasis 02/05/2017  . Pulmonary fibrosis ([place])   . SVT (supraventricular tachycardia) ([place])   . Thyroid disease    hypo   Past Surgical History:  Procedure Laterality Date  . ABDOMINAL HYSTERECTOMY    . APPENDECTOMY    . CHOLECYSTECTOMY    . CRANIOTOMY     for aneurysms  . CRANIOTOMY  1980 x 2   aneurysmal clipping  . HERNIA REPAIR    . TOTAL ABDOMINAL HYSTERECTOMY    . TUBAL LIGATION      reports that she quit smoking about 12 years ago. Her smoking use included cigarettes. She has a 100.00 pack-year smoking history. She has never used smokeless tobacco. She reports current alcohol use. She reports that she does not use drugs. family history includes Allergies in her sister; Bone cancer in her father; Breast cancer in her sister; Cancer in her sister; Clotting disorder in her father and sister; Emphysema in her father, mother, and sister; Heart disease in her father and mother; Lung cancer in her father and mother; Prostate cancer in her father; Rheum arthritis in her father, mother, and sister. Allergies  Allergen Reactions  . Lipitor [Atorvastatin] Shortness Of Breath, Diarrhea and Other (See Comments)    Dizzy, pain all over.  . Codeine   . Hydromorphone Hcl     REACTION: dementia  . Levaquin [Levofloxacin In D5w]     Shock per pt but  can take cipro  . Morphine   . Oxycodone-Acetaminophen   . Propoxyphene N-Acetaminophen   . Simvastatin    Current Outpatient Medications on File Prior to Visit  Medication Sig Dispense Refill  . acetaminophen (TYLENOL) 500 MG tablet Take 500 mg by mouth every 6 (six) hours as needed.    Marland Kitchen amoxicillin-clavulanate (AUGMENTIN) 875-125 MG tablet Take 1 tablet by mouth 2 (two) times daily. 20 tablet 0  . Ascorbic Acid (VITAMIN C PO) Take by mouth.    Marland Kitchen aspirin EC 81 MG tablet Take 81 mg by mouth daily.    . blood glucose meter kit and supplies Dispense based on patient and insurance preference. Use up to four times daily as directed. (FOR ICD-10 E11.9). 1 each 0  . CALCIUM-MAGNESIUM-ZINC PO Take 1 tablet by mouth daily.    . cholecalciferol (VITAMIN D) 1000 units tablet Take 5,000 Units by mouth daily.    Marland Kitchen diltiazem (CARDIZEM CD) 180 MG 24 hr capsule Take 1 capsule (180 mg total) by mouth daily. 90 capsule 0  . escitalopram (LEXAPRO) 20 MG tablet Take 1 tablet (20 mg total) by mouth at bedtime. 90 tablet 0  . fenofibrate 160 MG tablet Take 1 tablet (160 mg total) by mouth daily. 90 tablet 3  . fluconazole (DIFLUCAN) 150 MG tablet 1 tab by mouth every 3 days as needed 2  tablet 1  . fluocinonide (LIDEX) 0.05 % external solution APP EXT AA OF SCALP NIGHTLY  3  . furosemide (LASIX) 80 MG tablet Take 1 tablet (80 mg total) by mouth daily. 90 tablet 3  . ipratropium-albuterol (DUONEB) 0.5-2.5 (3) MG/3ML SOLN Take 3 mLs by nebulization every 4 (four) hours as needed. 360 mL 0  . levothyroxine (SYNTHROID) 100 MCG tablet Take 1 tablet (100 mcg total) by mouth daily before breakfast. 90 tablet 3  . lisinopril (ZESTRIL) 5 MG tablet Take 1 tablet (5 mg total) by mouth daily. 90 tablet 3  . meloxicam (MOBIC) 7.5 MG tablet Take 1 tablet (7.5 mg total) by mouth daily. 30 tablet 0  . potassium chloride SA (K-DUR) 20 MEQ tablet Take 1 tablet (20 mEq) by mouth daily with food 90 tablet 0  . rosuvastatin  (CRESTOR) 20 MG tablet Take 1 tablet (20 mg total) by mouth daily. 90 tablet 3  . traMADol (ULTRAM) 50 MG tablet Take 1 tablet (50 mg total) by mouth 2 (two) times daily. 180 tablet 1  . traZODone (DESYREL) 50 MG tablet Take 1 tablet (50 mg total) by mouth at bedtime as needed for sleep. 90 tablet 1  . triamcinolone (NASACORT) 55 MCG/ACT AERO nasal inhaler Place 2 sprays into the nose daily. 3 Inhaler 3  . vitamin B-12 (CYANOCOBALAMIN) 1000 MCG tablet Take 500 mcg by mouth daily.    . vitamin C (ASCORBIC ACID) 500 MG tablet Take 500 mg by mouth daily.    . Vitamin D, Ergocalciferol, (DRISDOL) 50000 units CAPS capsule Take 1 capsule (50,000 Units total) by mouth every 7 (seven) days. 12 capsule 0  . vitamin E 400 UNIT capsule Take 200 Units by mouth daily.     No current facility-administered medications on file prior to visit.    Review of Systems  Constitutional: Negative for other unusual diaphoresis or sweats HENT: Negative for ear discharge or swelling Eyes: Negative for other worsening visual disturbances Respiratory: Negative for stridor or other swelling  Gastrointestinal: Negative for worsening distension or other blood Genitourinary: Negative for retention or other urinary change Musculoskeletal: Negative for other MSK pain or swelling Skin: Negative for color change or other new lesions Neurological: Negative for worsening tremors and other numbness  Psychiatric/Behavioral: Negative for worsening agitation or other fatigue All other system neg per pt    Objective:   Physical Exam BP 116/72   Pulse 69   Ht 5' (1.524 m)   Wt 169 lb (76.7 kg)   SpO2 99%   BMI 33.01 kg/m  VS noted, mild ill, on home o2 2.5 L  Constitutional: Pt appears in NAD HENT: Head: NCAT.  Right Ear: External ear normal.  Left Ear: External ear normal.  Eyes: . Pupils are equal, round, and reactive to light. Conjunctivae and EOM are normal Nose: without d/c or deformity Bilat tm's with mild  erythema.  Max sinus areas mild tender.  Pharynx with mild erythema, no exudate Neck: Neck supple. Gross normal ROM Cardiovascular: Normal rate and regular rhythm.   Pulmonary/Chest: Effort normal and breath sounds decreased without rales or wheezing.  Neurological: Pt is alert. At baseline orientation, motor grossly intact Skin: Skin is warm. No rashes, other new lesions, no LE edema Psychiatric: Pt behavior is normal without agitation         Assessment & Plan:

## 2018-08-28 NOTE — Assessment & Plan Note (Signed)
Mild to mod, for antibx course,  to f/u any worsening symptoms or concerns 

## 2018-08-28 NOTE — Assessment & Plan Note (Signed)
stable overall by history and exam, recent data reviewed with pt, and pt to continue medical treatment as before,  to f/u any worsening symptoms or concerns  

## 2018-09-11 ENCOUNTER — Ambulatory Visit: Payer: Medicare HMO | Admitting: Pulmonary Disease

## 2018-09-12 DIAGNOSIS — J8410 Pulmonary fibrosis, unspecified: Secondary | ICD-10-CM | POA: Diagnosis not present

## 2018-09-12 DIAGNOSIS — G4733 Obstructive sleep apnea (adult) (pediatric): Secondary | ICD-10-CM | POA: Diagnosis not present

## 2018-09-12 DIAGNOSIS — J449 Chronic obstructive pulmonary disease, unspecified: Secondary | ICD-10-CM | POA: Diagnosis not present

## 2018-09-12 DIAGNOSIS — J9621 Acute and chronic respiratory failure with hypoxia: Secondary | ICD-10-CM | POA: Diagnosis not present

## 2018-09-22 ENCOUNTER — Other Ambulatory Visit: Payer: Medicare HMO

## 2018-09-22 ENCOUNTER — Ambulatory Visit: Payer: Medicare HMO | Admitting: Internal Medicine

## 2018-09-30 ENCOUNTER — Ambulatory Visit: Payer: Medicare HMO | Admitting: Internal Medicine

## 2018-10-02 ENCOUNTER — Encounter: Payer: Self-pay | Admitting: Nurse Practitioner

## 2018-10-02 ENCOUNTER — Ambulatory Visit (INDEPENDENT_AMBULATORY_CARE_PROVIDER_SITE_OTHER): Payer: Medicare HMO | Admitting: Nurse Practitioner

## 2018-10-02 ENCOUNTER — Other Ambulatory Visit: Payer: Self-pay

## 2018-10-02 VITALS — BP 118/62 | HR 64 | Temp 97.8°F | Ht 59.0 in | Wt 165.4 lb

## 2018-10-02 DIAGNOSIS — J9611 Chronic respiratory failure with hypoxia: Secondary | ICD-10-CM | POA: Diagnosis not present

## 2018-10-02 DIAGNOSIS — J8410 Pulmonary fibrosis, unspecified: Secondary | ICD-10-CM

## 2018-10-02 DIAGNOSIS — J849 Interstitial pulmonary disease, unspecified: Secondary | ICD-10-CM

## 2018-10-02 MED ORDER — CEFDINIR 300 MG PO CAPS: 300.0000 mg | ORAL_CAPSULE | Freq: Two times a day (BID) | ORAL | 0 refills | Status: DC

## 2018-10-02 MED ORDER — PREDNISONE 10 MG PO TABS: 20.0000 mg | ORAL_TABLET | Freq: Every day | ORAL | 0 refills | Status: AC

## 2018-10-02 NOTE — Progress Notes (Signed)
@Patient  ID: Dawn Gomez, female    DOB: 10-08-1935, 83 y.o.   MRN: [id number]  Chief Complaint  Patient presents with  . Nasal Congestion    With green nasal congestion. Already been on two rounds of antibiotics that have not helped.    Referring provider: Biagio Borg, MD   HPI   83 year old female with COPD, chronic respiratory failure, and pulmonary fibrosis who is followed by Dr. Lake Bells.  Synopsis:  Former patient of Dr. Gwenette Greet has pulmonary fibrosis of uncertain etiology.  She also has some centrilobular emphysema but has not had much benefit from long-acting bronchodilators.  She is dependent on 4 L of oxygen continuously, continuous flow only.  Tests: Imaging: CXR 10/2010:  Stable interstitial changes. CXR 2013:  Stable IS changes CT chest 2014:  Subpleural reticulation, ?early honeycombing, per chest radiology >> no definite GGO, may be UIP or maybe not.  HRCT 01/2014:  Slight progression of ISLD, and pattern suggestive of UIP 03/2015 CT chest , stable fibrosis since 2014  2018 high-resolution CT chest showing upper lobe predominant centrilobular emphysema with nonspecific patchy fibrotic changes in the bases somewhat in a bronchovascular distribution  Other: ONO on RA with cpap 06/2012:  Low sat 85%, only 7 min less than 88% entire night.  Ambul ox 07/2013:  desat to 87% transiently.  Pt would like to hold off on portable oxygen   Echo: TTE 06/2012:  Normal LV, DD, normal RV, no evidence for pulmonary htn.  TTE 08/2017: LVEF 55%, Mild LVH, some septal flattening, RVSP 38mHg  OV 10/02/18 - Follow up Patient presents today for follow-up visit.  She was last seen by Dr. MLake Bellson 02/09/2018.  She states that this has been a stable interval for her.  She has been seen by her primary care recently for sinusitis.  She was prescribed doxycycline.  She complains today of ongoing sinus pressure, congestion, and pain.  Reports yellow-green nasal drainage.  She has been using  afrin daily.  Her weight is stable, she does report slight lower extremity edema.  She is compliant with Lasix and takes 80 mg daily.  Patient is on 4 L O2 continuous. Denies f/c/s, n/v/d, hemoptysis, PND, leg swelling.     Allergies  Allergen Reactions  . Lipitor [Atorvastatin] Shortness Of Breath, Diarrhea and Other (See Comments)    Dizzy, pain all over.  . Codeine   . Hydromorphone Hcl     REACTION: dementia  . Levaquin [Levofloxacin In D5w]     Shock per pt but can take cipro  . Morphine   . Oxycodone-Acetaminophen   . Propoxyphene N-Acetaminophen   . Simvastatin     Immunization History  Administered Date(s) Administered  . Influenza Whole 02/06/2009, 11/06/2009, 01/29/2012  . Influenza, High Dose Seasonal PF 02/05/2017, 02/09/2018  . Influenza,inj,Quad PF,6+ Mos 02/08/2013, 01/28/2014  . Influenza-Unspecified 03/09/2015, 12/08/2015  . Pneumococcal Polysaccharide-23 02/06/2009, 09/26/2017  . Pneumococcal-Unspecified 04/08/2014  . Td 08/06/2012    Past Medical History:  Diagnosis Date  . Abnormal heart rhythm   . [place]   . Dyspnea   . Hypothyroid   . OSA (obstructive sleep apnea)    on cpap  . Psoriasis 02/05/2017  . Pulmonary fibrosis ([place]   . SV[place]ricular [place]) [place]   . Thyroid disease    hypo    Tobacco History: Social History   Tobacco Use  Smoking Status Former Smoker  . Packs/day: 2.00  . Years: 50.00  . Pack years:  100.00  . Types: Cigarettes  . Quit date: 2008  . Years since quitting: 12.4  Smokeless Tobacco Never Used   Counseling given: Not Answered   Outpatient Encounter Medications as of 10/02/2018  Medication Sig  . acetaminophen (TYLENOL) 500 MG tablet Take 500 mg by mouth every 6 (six) hours as needed.  . Ascorbic Acid (VITAMIN C PO) Take by mouth.  Marland Kitchen aspirin EC 81 MG tablet Take 81 mg by mouth daily.  . blood glucose meter kit and supplies Dispense based on patient and insurance preference. Use up  to four times daily as directed. (FOR ICD-10 E11.9).  . CALCIUM-MAGNESIUM-ZINC PO Take 1 tablet by mouth daily.  . cholecalciferol (VITAMIN D) 1000 units tablet Take 5,000 Units by mouth daily.  Marland Kitchen diltiazem (CARDIZEM CD) 180 MG 24 hr capsule Take 1 capsule (180 mg total) by mouth daily.  Marland Kitchen escitalopram (LEXAPRO) 20 MG tablet Take 1 tablet (20 mg total) by mouth at bedtime.  . fenofibrate 160 MG tablet Take 1 tablet (160 mg total) by mouth daily.  . fluconazole (DIFLUCAN) 150 MG tablet 1 tab by mouth every 3 days as needed  . fluocinonide (LIDEX) 0.05 % external solution APP EXT AA OF SCALP NIGHTLY  . furosemide (LASIX) 80 MG tablet Take 1 tablet (80 mg total) by mouth daily.  Marland Kitchen ipratropium-albuterol (DUONEB) 0.5-2.5 (3) MG/3ML SOLN Take 3 mLs by nebulization every 4 (four) hours as needed.  Marland Kitchen levothyroxine (SYNTHROID) 100 MCG tablet Take 1 tablet (100 mcg total) by mouth daily before breakfast.  . lisinopril (ZESTRIL) 5 MG tablet Take 1 tablet (5 mg total) by mouth daily.  . meloxicam (MOBIC) 7.5 MG tablet Take 1 tablet (7.5 mg total) by mouth daily.  . potassium chloride SA (K-DUR) 20 MEQ tablet Take 1 tablet (20 mEq) by mouth daily with food  . rosuvastatin (CRESTOR) 20 MG tablet Take 1 tablet (20 mg total) by mouth daily.  . traMADol (ULTRAM) 50 MG tablet Take 1 tablet (50 mg total) by mouth 2 (two) times daily.  . traZODone (DESYREL) 50 MG tablet Take 1 tablet (50 mg total) by mouth at bedtime as needed for sleep.  Marland Kitchen triamcinolone (NASACORT) 55 MCG/ACT AERO nasal inhaler Place 2 sprays into the nose daily.  . vitamin B-12 (CYANOCOBALAMIN) 1000 MCG tablet Take 500 mcg by mouth daily.  . vitamin C (ASCORBIC ACID) 500 MG tablet Take 500 mg by mouth daily.  . Vitamin D, Ergocalciferol, (DRISDOL) 50000 units CAPS capsule Take 1 capsule (50,000 Units total) by mouth every 7 (seven) days.  . vitamin E 400 UNIT capsule Take 200 Units by mouth daily.  . cefdinir (OMNICEF) 300 MG capsule Take 1  capsule (300 mg total) by mouth 2 (two) times daily.  . predniSONE (DELTASONE) 10 MG tablet Take 2 tablets (20 mg total) by mouth daily with breakfast for 5 days.  . [DISCONTINUED] amoxicillin-clavulanate (AUGMENTIN) 875-125 MG tablet Take 1 tablet by mouth 2 (two) times daily. (Patient not taking: Reported on 10/02/2018)  . [DISCONTINUED] doxycycline (VIBRA-TABS) 100 MG tablet Take 1 tablet (100 mg total) by mouth 2 (two) times daily. (Patient not taking: Reported on 10/02/2018)   No facility-administered encounter medications on file as of 10/02/2018.      Review of Systems  Review of Systems  Constitutional: Negative.  Negative for chills and fever.  HENT: Positive for congestion, postnasal drip, sinus pressure and sinus pain.   Respiratory: Positive for shortness of breath. Negative for cough.   Cardiovascular: Positive  for leg swelling. Negative for chest pain and palpitations.  Gastrointestinal: Negative.   Allergic/Immunologic: Negative.   Neurological: Negative.   Psychiatric/Behavioral: Negative.        Physical Exam  BP 118/62 (BP Location: Left Arm, Patient Position: Sitting, Cuff Size: Normal)   Pulse 64   Temp 97.8 F (36.6 C)   Ht 4' 11"  (1.499 m)   Wt 165 lb 6.4 oz (75 kg)   SpO2 98% Comment: on 4L of O2 continuous  BMI 33.41 kg/m   Wt Readings from Last 5 Encounters:  10/02/18 165 lb 6.4 oz (75 kg)  08/28/18 169 lb (76.7 kg)  04/10/18 172 lb (78 kg)  03/25/18 171 lb (77.6 kg)  03/25/18 171 lb (77.6 kg)     Physical Exam Vitals signs and nursing note reviewed.  Constitutional:      General: She is not in acute distress.    Appearance: She is well-developed.  Cardiovascular:     Rate and Rhythm: Normal rate and regular rhythm.  Pulmonary:     Effort: Pulmonary effort is normal. No respiratory distress.     Breath sounds: Normal breath sounds. No wheezing or rhonchi.  Musculoskeletal:        General: Swelling: 1+ edema bilateral.  Neurological:      Mental Status: She is alert and oriented to person, place, and time.       Assessment & Plan:   Pulmonary fibrosis (HCC) Centrilobular emphysema: Keep using albuterol as needed for chest tightness wheezing or shortness of breath  Chronic respiratory failure with hypoxemia: Continue using 4 L of oxygen continuously  Interstitial lung disease: Stable interval for patient  Practice good hand hygiene Stay active  Sinusitis: Will order cefdinir Will order prednisone Advised patient not to use afrin   Patient Instructions  Centrilobular emphysema: Keep using albuterol as needed for chest tightness wheezing or shortness of breath   Sinusitis: Will order cefdinir Will order prednisone  Follow up: Follow up in 4 months with Dr. Lake Bells or sooner if needed         Fenton Foy, NP 10/02/2018

## 2018-10-02 NOTE — Assessment & Plan Note (Addendum)
Centrilobular emphysema: Keep using albuterol as needed for chest tightness wheezing or shortness of breath  Chronic respiratory failure with hypoxemia: Continue using 4 L of oxygen continuously  Interstitial lung disease: Stable interval for patient  Practice good hand hygiene Stay active  Sinusitis: Will order cefdinir Will order prednisone Advised patient not to use afrin   Patient Instructions  Centrilobular emphysema: Keep using albuterol as needed for chest tightness wheezing or shortness of breath   Sinusitis: Will order cefdinir Will order prednisone  Follow up: Follow up in 4 months with Dr. Lake Bells or sooner if needed

## 2018-10-02 NOTE — Patient Instructions (Addendum)
Centrilobular emphysema: Keep using albuterol as needed for chest tightness wheezing or shortness of breath   Sinusitis: Will order cefdinir Will order prednisone  Follow up: Follow up in 4 months with Dr. Lake Bells or sooner if needed

## 2018-10-03 NOTE — Progress Notes (Signed)
Reviewed, agree 

## 2018-10-06 ENCOUNTER — Other Ambulatory Visit: Payer: Self-pay

## 2018-10-06 ENCOUNTER — Other Ambulatory Visit (INDEPENDENT_AMBULATORY_CARE_PROVIDER_SITE_OTHER): Payer: Medicare HMO

## 2018-10-06 ENCOUNTER — Ambulatory Visit (INDEPENDENT_AMBULATORY_CARE_PROVIDER_SITE_OTHER): Payer: Medicare HMO | Admitting: Internal Medicine

## 2018-10-06 ENCOUNTER — Other Ambulatory Visit: Payer: Medicare HMO

## 2018-10-06 ENCOUNTER — Encounter: Payer: Self-pay | Admitting: Internal Medicine

## 2018-10-06 VITALS — BP 102/60 | HR 66 | Temp 98.4°F | Ht 59.0 in | Wt 165.0 lb

## 2018-10-06 DIAGNOSIS — E559 Vitamin D deficiency, unspecified: Secondary | ICD-10-CM | POA: Diagnosis not present

## 2018-10-06 DIAGNOSIS — Z0000 Encounter for general adult medical examination without abnormal findings: Secondary | ICD-10-CM

## 2018-10-06 DIAGNOSIS — R739 Hyperglycemia, unspecified: Secondary | ICD-10-CM

## 2018-10-06 DIAGNOSIS — Z23 Encounter for immunization: Secondary | ICD-10-CM | POA: Diagnosis not present

## 2018-10-06 DIAGNOSIS — M25571 Pain in right ankle and joints of right foot: Secondary | ICD-10-CM | POA: Diagnosis not present

## 2018-10-06 LAB — CBC WITH DIFFERENTIAL/PLATELET
Basophils Absolute: 0.1 10*3/uL (ref 0.0–0.1)
Basophils Relative: 0.9 % (ref 0.0–3.0)
Eosinophils Absolute: 0.2 10*3/uL (ref 0.0–0.7)
Eosinophils Relative: 3.7 % (ref 0.0–5.0)
HCT: 37.5 % (ref 36.0–46.0)
Hemoglobin: 12.6 g/dL (ref 12.0–15.0)
Lymphocytes Relative: 19.5 % (ref 12.0–46.0)
Lymphs Abs: 1.2 10*3/uL (ref 0.7–4.0)
MCHC: 33.5 g/dL (ref 30.0–36.0)
MCV: 93.2 fl (ref 78.0–100.0)
Monocytes Absolute: 0.6 10*3/uL (ref 0.1–1.0)
Monocytes Relative: 9.1 % (ref 3.0–12.0)
Neutro Abs: 4.2 10*3/uL (ref 1.4–7.7)
Neutrophils Relative %: 66.8 % (ref 43.0–77.0)
Platelets: 233 10*3/uL (ref 150.0–400.0)
RBC: 4.02 Mil/uL (ref 3.87–5.11)
RDW: 14.5 % (ref 11.5–15.5)
WBC: 6.2 10*3/uL (ref 4.0–10.5)

## 2018-10-06 LAB — URINALYSIS, ROUTINE W REFLEX MICROSCOPIC
Bilirubin Urine: NEGATIVE
Hgb urine dipstick: NEGATIVE
Ketones, ur: NEGATIVE
Nitrite: NEGATIVE
Specific Gravity, Urine: 1.025 (ref 1.000–1.030)
Total Protein, Urine: NEGATIVE
Urine Glucose: NEGATIVE
Urobilinogen, UA: 0.2 (ref 0.0–1.0)
pH: 5.5 (ref 5.0–8.0)

## 2018-10-06 LAB — TSH: TSH: 0.71 u[IU]/mL (ref 0.35–4.50)

## 2018-10-06 LAB — VITAMIN D 25 HYDROXY (VIT D DEFICIENCY, FRACTURES): VITD: 78.62 ng/mL (ref 30.00–100.00)

## 2018-10-06 LAB — HEMOGLOBIN A1C: Hgb A1c MFr Bld: 5.6 % (ref 4.6–6.5)

## 2018-10-06 MED ORDER — FLUCONAZOLE 150 MG PO TABS: ORAL_TABLET | ORAL | 1 refills | Status: DC

## 2018-10-06 MED ORDER — ROSUVASTATIN CALCIUM 20 MG PO TABS: 20.0000 mg | ORAL_TABLET | Freq: Every day | ORAL | 3 refills | Status: DC

## 2018-10-06 NOTE — Assessment & Plan Note (Signed)
stable overall by history and exam, recent data reviewed with pt, and pt to continue medical treatment as before,  to f/u any worsening symptoms or concerns  

## 2018-10-06 NOTE — Patient Instructions (Signed)
You had the Prevnar 13 pneumonia shot today  Please continue all other medications as before, and refills have been done if requested.  Please have the pharmacy call with any other refills you may need.  Please continue your efforts at being more active, low cholesterol diet, and weight control.  You are otherwise up to date with prevention measures today.  Please keep your appointments with your specialists as you may have planned  Please go to the LAB in the Basement (turn left off the elevator) for the tests to be done today  You will be contacted by phone if any changes need to be made immediately.  Otherwise, you will receive a letter about your results with an explanation, but please check with MyChart first.  Please remember to sign up for MyChart if you have not done so, as this will be important to you in the future with finding out test results, communicating by private email, and scheduling acute appointments online when needed.  Please return in 6 months, or sooner if needed

## 2018-10-06 NOTE — Assessment & Plan Note (Signed)
C/w tendonitis, declines film, tylenol prn,  to f/u any worsening symptoms or concerns

## 2018-10-06 NOTE — Progress Notes (Signed)
Subjective:    Patient ID: Dawn Gomez, female    DOB: July 24, 1935, 83 y.o.   MRN: [id number]  HPI  Here for wellness and f/u;  Overall doing ok;  Pt denies Chest pain, worsening SOB, DOE, wheezing, orthopnea, PND, worsening LE edema, palpitations, dizziness or syncope.  Pt denies neurological change such as new headache, facial or extremity weakness.  Pt denies polydipsia, polyuria, or low sugar symptoms. Pt states overall good compliance with treatment and medications, good tolerability, and has been trying to follow appropriate diet.  Pt denies worsening depressive symptoms, suicidal ideation or panic. No fever, night sweats, wt loss, loss of appetite, or other constitutional symptoms.  Pt states good ability with ADL's, has low fall risk, home safety reviewed and adequate, no other significant changes in hearing or vision, and only occasionally active with exercise.   Also with c/o 2 wks onset right ankle localized area of swelling, tender without skin change, but worse to walk, better to sit, mild, intermittent, sharp. Past Medical History:  Diagnosis Date  . Abnormal heart rhythm   . Aortic aneurysm ([place])   . Dyspnea   . Hypothyroid   . OSA (obstructive sleep apnea)    on cpap  . Psoriasis 02/05/2017  . Pulmonary fibrosis ([place])   . SVT (supraventricular tachycardia) ([place])   . Thyroid disease    hypo   Past Surgical History:  Procedure Laterality Date  . ABDOMINAL HYSTERECTOMY    . APPENDECTOMY    . CHOLECYSTECTOMY    . CRANIOTOMY     for aneurysms  . CRANIOTOMY  1980 x 2   aneurysmal clipping  . HERNIA REPAIR    . TOTAL ABDOMINAL HYSTERECTOMY    . TUBAL LIGATION      reports that she quit smoking about 12 years ago. Her smoking use included cigarettes. She has a 100.00 pack-year smoking history. She has never used smokeless tobacco. She reports current alcohol use. She reports that she does not use drugs. family history includes Allergies in her sister; Bone cancer in her  father; Breast cancer in her sister; Cancer in her sister; Clotting disorder in her father and sister; Emphysema in her father, mother, and sister; Heart disease in her father and mother; Lung cancer in her father and mother; Prostate cancer in her father; Rheum arthritis in her father, mother, and sister. Allergies  Allergen Reactions  . Lipitor [Atorvastatin] Shortness Of Breath, Diarrhea and Other (See Comments)    Dizzy, pain all over.  . Codeine   . Hydromorphone Hcl     REACTION: dementia  . Levaquin [Levofloxacin In D5w]     Shock per pt but can take cipro  . Morphine   . Oxycodone-Acetaminophen   . Propoxyphene N-Acetaminophen   . Simvastatin    Current Outpatient Medications on File Prior to Visit  Medication Sig Dispense Refill  . acetaminophen (TYLENOL) 500 MG tablet Take 500 mg by mouth every 6 (six) hours as needed.    . Ascorbic Acid (VITAMIN C PO) Take by mouth.    Marland Kitchen aspirin EC 81 MG tablet Take 81 mg by mouth daily.    . blood glucose meter kit and supplies Dispense based on patient and insurance preference. Use up to four times daily as directed. (FOR ICD-10 E11.9). 1 each 0  . CALCIUM-MAGNESIUM-ZINC PO Take 1 tablet by mouth daily.    . cefdinir (OMNICEF) 300 MG capsule Take 1 capsule (300 mg total) by mouth 2 (two) times daily. [place]  capsule 0  . cholecalciferol (VITAMIN D) 1000 units tablet Take 5,000 Units by mouth daily.    Marland Kitchen diltiazem (CARDIZEM CD) 180 MG 24 hr capsule Take 1 capsule (180 mg total) by mouth daily. 90 capsule 0  . escitalopram (LEXAPRO) 20 MG tablet Take 1 tablet (20 mg total) by mouth at bedtime. 90 tablet 0  . fenofibrate 160 MG tablet Take 1 tablet (160 mg total) by mouth daily. 90 tablet 3  . fluocinonide (LIDEX) 0.05 % external solution APP EXT AA OF SCALP NIGHTLY  3  . furosemide (LASIX) 80 MG tablet Take 1 tablet (80 mg total) by mouth daily. 90 tablet 3  . ipratropium-albuterol (DUONEB) 0.5-2.5 (3) MG/3ML SOLN Take 3 mLs by nebulization every 4  (four) hours as needed. 360 mL 0  . levothyroxine (SYNTHROID) 100 MCG tablet Take 1 tablet (100 mcg total) by mouth daily before breakfast. 90 tablet 3  . lisinopril (ZESTRIL) 5 MG tablet Take 1 tablet (5 mg total) by mouth daily. 90 tablet 3  . meloxicam (MOBIC) 7.5 MG tablet Take 1 tablet (7.5 mg total) by mouth daily. 30 tablet 0  . potassium chloride SA (K-DUR) 20 MEQ tablet Take 1 tablet (20 mEq) by mouth daily with food 90 tablet 0  . predniSONE (DELTASONE) 10 MG tablet Take 2 tablets (20 mg total) by mouth daily with breakfast for 5 days. 10 tablet 0  . traMADol (ULTRAM) 50 MG tablet Take 1 tablet (50 mg total) by mouth 2 (two) times daily. 180 tablet 1  . traZODone (DESYREL) 50 MG tablet Take 1 tablet (50 mg total) by mouth at bedtime as needed for sleep. 90 tablet 1  . triamcinolone (NASACORT) 55 MCG/ACT AERO nasal inhaler Place 2 sprays into the nose daily. 3 Inhaler 3  . vitamin B-12 (CYANOCOBALAMIN) 1000 MCG tablet Take 500 mcg by mouth daily.    . vitamin C (ASCORBIC ACID) 500 MG tablet Take 500 mg by mouth daily.    . Vitamin D, Ergocalciferol, (DRISDOL) 50000 units CAPS capsule Take 1 capsule (50,000 Units total) by mouth every 7 (seven) days. 12 capsule 0  . vitamin E 400 UNIT capsule Take 200 Units by mouth daily.     No current facility-administered medications on file prior to visit.    Review of Systems Constitutional: Negative for other unusual diaphoresis, sweats, appetite or weight changes HENT: Negative for other worsening hearing loss, ear pain, facial swelling, mouth sores or neck stiffness.   Eyes: Negative for other worsening pain, redness or other visual disturbance.  Respiratory: Negative for other stridor or swelling Cardiovascular: Negative for other palpitations or other chest pain  Gastrointestinal: Negative for worsening diarrhea or loose stools, blood in stool, distention or other pain Genitourinary: Negative for hematuria, flank pain or other change in  urine volume.  Musculoskeletal: Negative for myalgias or other joint swelling.  Skin: Negative for other color change, or other wound or worsening drainage.  Neurological: Negative for other syncope or numbness. Hematological: Negative for other adenopathy or swelling Psychiatric/Behavioral: Negative for hallucinations, other worsening agitation, SI, self-injury, or new decreased concentration All other system neg per pt    Objective:   Physical Exam BP 102/60 (BP Location: Left Arm, Patient Position: Sitting, Cuff Size: Large)   Pulse 66   Temp 98.4 F (36.9 C) (Oral)   Ht _0  (1.499 m)   Wt 165 lb (74.8 kg)   SpO2 99% Comment: 4 L O2  BMI 33.33 kg/m  VS noted,  Constitutional: Pt is oriented to person, place, and time. Appears well-developed and well-nourished, in no significant distress and comfortable Head: Normocephalic and atraumatic  Eyes: Conjunctivae and EOM are normal. Pupils are equal, round, and reactive to light Right Ear: External ear normal without discharge Left Ear: External ear normal without discharge Nose: Nose without discharge or deformity Mouth/Throat: Oropharynx is without other ulcerations and moist  Neck: Normal range of motion. Neck supple. No JVD present. No tracheal deviation present or significant neck LA or mass Cardiovascular: Normal rate, regular rhythm, normal heart sounds and intact distal pulses.   Pulmonary/Chest: WOB normal and breath sounds without rales or wheezing  Abdominal: Soft. Bowel sounds are normal. NT. No HSM  Musculoskeletal: Normal range of motion. Exhibits no edema, except for localized area about 1-2 cm medial aspect of the anterior ankle in the first dorsal compartment Lymphadenopathy: Has no other cervical adenopathy.  Neurological: Pt is alert and oriented to person, place, and time. Pt has normal reflexes. No cranial nerve deficit. Motor grossly intact, Gait intact Skin: Skin is warm and dry. No rash noted or new  ulcerations Psychiatric:  Has normal mood and affect. Behavior is normal without agitation No other exam findings Lab Results  Component Value Date   WBC 6.1 03/25/2018   HGB 12.4 03/25/2018   HCT 37.1 03/25/2018   PLT 268.0 03/25/2018   GLUCOSE 103 (H) 03/25/2018   CHOL 123 03/25/2018   TRIG 132.0 03/25/2018   HDL 48.60 03/25/2018   LDLDIRECT 138.0 02/05/2017   LDLCALC 48 03/25/2018   ALT 19 03/25/2018   AST 17 03/25/2018   NA 133 (L) 03/25/2018   K 4.7 03/25/2018   CL 97 03/25/2018   CREATININE 0.88 03/25/2018   BUN 16 03/25/2018   CO2 28 03/25/2018   TSH 0.59 03/25/2018   HGBA1C 5.5 03/25/2018      Assessment & Plan:

## 2018-10-06 NOTE — Assessment & Plan Note (Signed)

## 2018-10-07 ENCOUNTER — Encounter: Payer: Self-pay | Admitting: Internal Medicine

## 2018-10-07 LAB — HEPATIC FUNCTION PANEL
ALT: 16 U/L (ref 0–35)
AST: 20 U/L (ref 0–37)
Albumin: 4.3 g/dL (ref 3.5–5.2)
Alkaline Phosphatase: 33 U/L — ABNORMAL LOW (ref 39–117)
Bilirubin, Direct: 0.1 mg/dL (ref 0.0–0.3)
Total Bilirubin: 0.4 mg/dL (ref 0.2–1.2)
Total Protein: 6.9 g/dL (ref 6.0–8.3)

## 2018-10-07 LAB — BASIC METABOLIC PANEL
BUN: 16 mg/dL (ref 6–23)
CO2: 26 mEq/L (ref 19–32)
Calcium: 9.5 mg/dL (ref 8.4–10.5)
Chloride: 99 mEq/L (ref 96–112)
Creatinine, Ser: 1.01 mg/dL (ref 0.40–1.20)
GFR: 52.67 mL/min — ABNORMAL LOW (ref >60.00)
Glucose, Bld: 81 mg/dL (ref 70–99)
Potassium: 4 mEq/L (ref 3.5–5.1)
Sodium: 137 mEq/L (ref 135–145)

## 2018-10-07 LAB — LIPID PANEL
Cholesterol: 121 mg/dL (ref 0–200)
HDL: 50.2 mg/dL (ref >39.00)
LDL Cholesterol: 40 mg/dL (ref 0–99)
NonHDL: 70.74
Total CHOL/HDL Ratio: 2
Triglycerides: 152 mg/dL — ABNORMAL HIGH (ref 0.0–149.0)
VLDL: 30.4 mg/dL (ref 0.0–40.0)

## 2018-10-12 DIAGNOSIS — J449 Chronic obstructive pulmonary disease, unspecified: Secondary | ICD-10-CM | POA: Diagnosis not present

## 2018-10-12 DIAGNOSIS — G4733 Obstructive sleep apnea (adult) (pediatric): Secondary | ICD-10-CM | POA: Diagnosis not present

## 2018-10-12 DIAGNOSIS — J8410 Pulmonary fibrosis, unspecified: Secondary | ICD-10-CM | POA: Diagnosis not present

## 2018-10-12 DIAGNOSIS — J9621 Acute and chronic respiratory failure with hypoxia: Secondary | ICD-10-CM | POA: Diagnosis not present

## 2018-10-23 ENCOUNTER — Other Ambulatory Visit: Payer: Self-pay | Admitting: Internal Medicine

## 2018-10-23 ENCOUNTER — Telehealth: Payer: Self-pay | Admitting: Internal Medicine

## 2018-10-23 NOTE — Telephone Encounter (Signed)
Medication Refill - Medication:  fenofibrate 160 MG tablet and potassium chloride SA (K-DUR) 20 MEQ tablet and traZODone (DESYREL) 50 MG tablet   Preferred Pharmacy (with phone number or street name):  [place], [place] [phone] (Phone) [phone] (Fax)

## 2018-10-26 MED ORDER — POTASSIUM CHLORIDE CRYS ER 20 MEQ PO TBCR: EXTENDED_RELEASE_TABLET | ORAL | 1 refills | Status: DC

## 2018-10-26 MED ORDER — TRAZODONE HCL 50 MG PO TABS: 50.0000 mg | ORAL_TABLET | Freq: Every evening | ORAL | 1 refills | Status: DC | PRN

## 2018-10-26 MED ORDER — FENOFIBRATE 160 MG PO TABS: 160.0000 mg | ORAL_TABLET | Freq: Every day | ORAL | 1 refills | Status: DC

## 2018-11-12 DIAGNOSIS — J9621 Acute and chronic respiratory failure with hypoxia: Secondary | ICD-10-CM | POA: Diagnosis not present

## 2018-11-12 DIAGNOSIS — J449 Chronic obstructive pulmonary disease, unspecified: Secondary | ICD-10-CM | POA: Diagnosis not present

## 2018-11-12 DIAGNOSIS — J8410 Pulmonary fibrosis, unspecified: Secondary | ICD-10-CM | POA: Diagnosis not present

## 2018-11-12 DIAGNOSIS — G4733 Obstructive sleep apnea (adult) (pediatric): Secondary | ICD-10-CM | POA: Diagnosis not present

## 2018-12-13 DIAGNOSIS — J8410 Pulmonary fibrosis, unspecified: Secondary | ICD-10-CM | POA: Diagnosis not present

## 2018-12-13 DIAGNOSIS — J449 Chronic obstructive pulmonary disease, unspecified: Secondary | ICD-10-CM | POA: Diagnosis not present

## 2018-12-13 DIAGNOSIS — J9621 Acute and chronic respiratory failure with hypoxia: Secondary | ICD-10-CM | POA: Diagnosis not present

## 2018-12-13 DIAGNOSIS — G4733 Obstructive sleep apnea (adult) (pediatric): Secondary | ICD-10-CM | POA: Diagnosis not present

## 2018-12-16 ENCOUNTER — Other Ambulatory Visit: Payer: Self-pay | Admitting: Internal Medicine

## 2018-12-17 NOTE — Telephone Encounter (Signed)
Don erx

## 2019-01-12 DIAGNOSIS — J449 Chronic obstructive pulmonary disease, unspecified: Secondary | ICD-10-CM | POA: Diagnosis not present

## 2019-01-12 DIAGNOSIS — J8410 Pulmonary fibrosis, unspecified: Secondary | ICD-10-CM | POA: Diagnosis not present

## 2019-01-12 DIAGNOSIS — J9621 Acute and chronic respiratory failure with hypoxia: Secondary | ICD-10-CM | POA: Diagnosis not present

## 2019-01-12 DIAGNOSIS — G4733 Obstructive sleep apnea (adult) (pediatric): Secondary | ICD-10-CM | POA: Diagnosis not present

## 2019-01-27 ENCOUNTER — Other Ambulatory Visit: Payer: Self-pay | Admitting: Internal Medicine

## 2019-01-27 NOTE — Telephone Encounter (Signed)
Should still have one refill for K

## 2019-01-31 NOTE — Progress Notes (Signed)
@Patient  ID: Dawn Gomez, female    DOB: April 02, 1936, 83 y.o.   MRN: [id number]  Chief Complaint  Patient presents with  . Follow-up    F/U for resp failure. States she has been having some muscle spasms in her lower back for the past 1 month.     Referring provider: Biagio Borg, MD  HPI:  83 year old female former smoker followed in our office for chronic respiratory failure, pulmonary fibrosis  PMH: Hypothyroidism, hyperlipidemia, SVT Smoker/ Smoking History: Former smoker.  Quit 2008.  100-pack-year smoking history. Maintenance:  None  Pt of: Dr. Lake Bells  02/01/2019  - Visit   Patient completing follow-up with our office.  She was last seen in our office back in June/2020.  Overall patient feels that she has been doing well.  Her breathing has been stable.  She is maintained on 4 L continuous for chronic respiratory failure.  Last CT in 2018 showed ILD likely NSIP.  Last pulmonary function testing results were in 2019, which showed a severe diffusion defect.  Patient does admit she continues to have muscle spasms as well as occasional back pain for the last 6 months.  She is unsure of her primary care doctor knows about this.  She is not scheduled to go back to primary care until December/2020.  Questionaires / Pulmonary Flowsheets:   MMRC: mMRC Dyspnea Scale mMRC Score  02/01/2019 0    Tests:   LABS  2009:  ANA, RF, ACE, ESR, all ok.  Autoimmune 01/2014:  Negative 04/2015 ANA, RF, CCP, JO-1, SSA, SSB, SCL-70, Anti-centromere all negative  PFT's  10/2010:  No obstruction, TLC 3.05 (73% pred), DLCO 6.1 (31%) PFT's 2013:  No obstruction, TLC 3.05 (73%), DLCO 6.3 (31%) PFT's 03/2013:  FEV1 1.52 (87%), ratio 88, FVC 1.73 (74%), TLC 2.80 (62%), DLCO 7.22 (38%) PFT's 01/2014:  FEV1 1.54 (90%), ratio 87, FVC 1.78 (76%), TLC 2.90 (65%), DLCO 6.22 (32%) 04/2015 PFT> FVC 1.67 L (73% predicted), total lung capacity 2.76 L (62% predicted), DLCO 6.50 (34% predicted). July 2017  pulmonary function testing ratio 89%, FVC 1.72 L 76% predicted, total lung capacity 3.08 L, 69% predicted, DLCO 5.89 31% predicted July 2018 pulmonary function tests: Ratio 91%, FVC 1.69 L, 76% predicted (pre FVC 1.84L) , total lung capacity 2.98 L 66% predicted, DLCO 5.18 27% predicted November 2019 lung function test: FVC 1.71 L 78% predicted, DLCO 5.72 mL 30% predicted  Imaging: CXR 10/2010:  Stable interstitial changes. CXR 2013:  Stable IS changes CT chest 2014:  Subpleural reticulation, ?early honeycombing, per chest radiology >> no definite GGO, may be UIP or maybe not.  HRCT 01/2014:  Slight progression of ISLD, and pattern suggestive of UIP 03/2015 CT chest , stable fibrosis since 2014  2018 high-resolution CT chest images  showing upper lobe predominant centrilobular emphysema with nonspecific patchy fibrotic changes in the bases somewhat in a bronchovascular distribution  Other: ONO on RA with cpap 06/2012:  Low sat 85%, only 7 min less than 88% entire night.  Ambul ox 07/2013:  desat to 87% transiently.  Pt would like to hold off on portable oxygen   Echo: TTE 06/2012:  Normal LV, DD, normal RV, no evidence for pulmonary htn.  TTE 08/2017: LVEF 55%, Mild LVH, some septal flattening, RVSP 78mHg   FENO:  No results found for: NITRICOXIDE  PFT: PFT Results Latest Ref Rng & Units 02/09/2018 10/29/2016 10/13/2015 04/14/2015 01/28/2014 04/02/2013  FVC-Pre L 1.73 1.84 1.79 1.67 1.78 1.73  FVC-Predicted Pre % 79 83 79 73 76 74  FVC-Post L 1.71 1.69 1.72 1.62 1.76 1.62  FVC-Predicted Post % 78 76 76 71 76 69  Pre FEV1/FVC % % 85 84 85 86 87 88  Post FEV1/FCV % % 88 91 89 88 86 89  FEV1-Pre L 1.46 1.55 1.52 1.43 1.54 1.52  FEV1-Predicted Pre % 90 95 91 85 90 87  FEV1-Post L 1.50 1.54 1.53 1.42 1.51 1.45  DLCO UNC% % 30 27 31  34 32 38  DLCO COR %Predicted % 50 45 48 56 53 60  TLC L 3.29 2.98 3.08 2.76 2.90 2.80  TLC % Predicted % 73 66 69 62 65 62  RV % Predicted % 64 54 54 46 38 44     WALK:  SIX MIN WALK 09/18/2016 03/24/2015 07/08/2013  Supplimental Oxygen during Test? (L/min) Yes Yes No  O2 Flow Rate 4 2 -  Type Continuous Continuous -  Tech Comments: Pt stopped during lap 1 due to dizziness, sats 93% 103 HR on 4L continuous  Pt dropped to 85% on 2L, increased to 3L, stayed at 91% -    Imaging: No results found.  Lab Results:  CBC    Component Value Date/Time   WBC 6.2 10/06/2018 1603   RBC 4.02 10/06/2018 1603   HGB 12.6 10/06/2018 1603   HCT 37.5 10/06/2018 1603   PLT 233.0 10/06/2018 1603   MCV 93.2 10/06/2018 1603   MCH 30.0 08/07/2016 0538   MCHC 33.5 10/06/2018 1603   RDW 14.5 10/06/2018 1603   LYMPHSABS 1.2 10/06/2018 1603   MONOABS 0.6 10/06/2018 1603   EOSABS 0.2 10/06/2018 1603   BASOSABS 0.1 10/06/2018 1603    BMET    Component Value Date/Time   NA 137 10/06/2018 1603   K 4.0 10/06/2018 1603   CL 99 10/06/2018 1603   CO2 26 10/06/2018 1603   GLUCOSE 81 10/06/2018 1603   BUN 16 10/06/2018 1603   CREATININE 1.01 10/06/2018 1603   CALCIUM 9.5 10/06/2018 1603   GFRNONAA >60 08/07/2016 0538   GFRAA >60 08/07/2016 0538    BNP    Component Value Date/Time   BNP 138.9 (H) 08/05/2016 2228    ProBNP    Component Value Date/Time   PROBNP 23.0 03/24/2015 1307    Specialty Problems      Pulmonary Problems   Interstitial lung disease ([place])    LABS 2009:  ANA, RF, ACE, ESR, all ok.  PFT's 10/2010:  No obstruction, TLC 3.05 (73% pred), DLCO 6.1 (31%) CXR 10/2010:  Stable interstitial changes. Felt to have RBILD vs ?IPF? CXR 2013:  Stable IS changes PFT's 2013:  No obstruction, TLC 3.05 (73%), DLCO 6.3 (31%) ONO on RA with cpap 06/2012:  Low sat 85%, only 7 min less than 88% entire night.  Echo 06/2012:  Normal LV, DD, normal RV, no evidence for pulmonary htn.  CT chest 2014:  Subpleural reticulation, ?early honeycombing, per chest radiology >> no definite GGO, may be UIP or maybe not.  PFT's 03/2013:  FEV1 1.52 (87%), ratio 88, FVC  1.73 (74%), TLC 2.80 (62%), DLCO 7.22 (38%) Ambul ox 07/2013:  desat to 87% transiently.  Pt would like to hold off on portable oxygen  PFT's 01/2014:  FEV1 1.54 (90%), ratio 87, FVC 1.78 (76%), TLC 2.90 (65%), DLCO 6.22 (32%) Autoimmune 01/2014:  Negative HRCT 01/2014:  Slight progression of ISLD, and pattern suggestive of UIP Can't afford pulmonary rehab 07/2014 discussed anti-fibrotic agents.  Pt wishes to wait until re-evaluation in October.  03/2015 CT chest , stable fibrosis since 2014  04/2015 ANA, RF, CCP, JO-1, SSA, SSB, SCL-70, Anti-centromere all negative 04/2015 PFT> FVC 1.67 L (73% predicted), total lung capacity 2.76 L (62% predicted), DLCO 6.50 (34% predicted). July 2017 pulmonary function testing ratio 89%, FVC 1.72 L 76% predicted, total lung capacity 3.08 L, 69% predicted, DLCO 5.89 31% predicted      Chronic respiratory failure (HCC)    4 L O2 continuously      Allergic rhinitis   Acute on chronic respiratory failure with hypoxia (HCC)   Sinusitis      Allergies  Allergen Reactions  . Lipitor [Atorvastatin] Shortness Of Breath, Diarrhea and Other (See Comments)    Dizzy, pain all over.  . Codeine   . Hydromorphone Hcl     REACTION: dementia  . Levaquin [Levofloxacin In D5w]     Shock per pt but can take cipro  . Morphine   . Oxycodone-Acetaminophen   . Propoxyphene N-Acetaminophen   . Simvastatin     Immunization History  Administered Date(s) Administered  . Influenza Whole 02/06/2009, 11/06/2009, 01/29/2012  . Influenza, High Dose Seasonal PF 02/05/2017, 02/09/2018  . Influenza,inj,Quad PF,6+ Mos 02/08/2013, 01/28/2014  . Influenza-Unspecified 03/09/2015, 12/08/2015  . Pneumococcal Conjugate-13 10/06/2018  . Pneumococcal Polysaccharide-23 02/06/2009, 09/26/2017  . Pneumococcal-Unspecified 04/08/2014  . Td 08/06/2012    Past Medical History:  Diagnosis Date  . Abnormal heart rhythm   . Aortic aneurysm ([place])   . Dyspnea   . Hypothyroid   . OSA  (obstructive sleep apnea)    on cpap  . Psoriasis 02/05/2017  . Pulmonary fibrosis ([place])   . SVT (supraventricular tachycardia) ([place])   . Thyroid disease    hypo    Tobacco History: Social History   Tobacco Use  Smoking Status Former Smoker  . Packs/day: 2.00  . Years: 50.00  . Pack years: 100.00  . Types: Cigarettes  . Quit date: 2008  . Years since quitting: 12.8  Smokeless Tobacco Never Used   Counseling given: Yes  Continue to not smoke  Outpatient Encounter Medications as of 02/01/2019  Medication Sig  . acetaminophen (TYLENOL) 500 MG tablet Take 500 mg by mouth every 6 (six) hours as needed.  . Ascorbic Acid (VITAMIN C PO) Take by mouth.  Marland Kitchen aspirin EC 81 MG tablet Take 81 mg by mouth daily.  . blood glucose meter kit and supplies Dispense based on patient and insurance preference. Use up to four times daily as directed. (FOR ICD-10 E11.9).  . CALCIUM-MAGNESIUM-ZINC PO Take 1 tablet by mouth daily.  . cholecalciferol (VITAMIN D) 1000 units tablet Take 5,000 Units by mouth daily.  Marland Kitchen diltiazem (CARDIZEM CD) 180 MG 24 hr capsule Take 1 capsule (180 mg total) by mouth daily.  Marland Kitchen escitalopram (LEXAPRO) 20 MG tablet TAKE 1 TABLET (20 MG TOTAL) BY MOUTH AT BEDTIME.  . fenofibrate 160 MG tablet Take 1 tablet (160 mg total) by mouth daily.  . fluconazole (DIFLUCAN) 150 MG tablet 1 tab by mouth every 3 days as needed  . fluocinonide (LIDEX) 0.05 % external solution APP EXT AA OF SCALP NIGHTLY  . furosemide (LASIX) 80 MG tablet Take 1 tablet (80 mg total) by mouth daily.  Marland Kitchen ipratropium-albuterol (DUONEB) 0.5-2.5 (3) MG/3ML SOLN Take 3 mLs by nebulization every 4 (four) hours as needed.  Marland Kitchen levothyroxine (SYNTHROID) 100 MCG tablet Take 1 tablet (100 mcg total) by mouth daily before breakfast.  .  lisinopril (ZESTRIL) 5 MG tablet Take 1 tablet (5 mg total) by mouth daily.  . meloxicam (MOBIC) 7.5 MG tablet Take 1 tablet (7.5 mg total) by mouth daily.  . potassium chloride SA (K-DUR)  20 MEQ tablet TAKE 1 TABLET (20 MEQ) BY MOUTH DAILY WITH FOOD  . rosuvastatin (CRESTOR) 20 MG tablet Take 1 tablet (20 mg total) by mouth daily.  . traMADol (ULTRAM) 50 MG tablet Take 1 tablet (50 mg total) by mouth 2 (two) times daily.  . traZODone (DESYREL) 50 MG tablet Take 1 tablet (50 mg total) by mouth at bedtime as needed for sleep.  Marland Kitchen triamcinolone (NASACORT) 55 MCG/ACT AERO nasal inhaler Place 2 sprays into the nose daily.  . vitamin B-12 (CYANOCOBALAMIN) 1000 MCG tablet Take 500 mcg by mouth daily.  . vitamin C (ASCORBIC ACID) 500 MG tablet Take 500 mg by mouth daily.  . Vitamin D, Ergocalciferol, (DRISDOL) 50000 units CAPS capsule Take 1 capsule (50,000 Units total) by mouth every 7 (seven) days.  . vitamin E 400 UNIT capsule Take 200 Units by mouth daily.  . [DISCONTINUED] cefdinir (OMNICEF) 300 MG capsule Take 1 capsule (300 mg total) by mouth 2 (two) times daily.   No facility-administered encounter medications on file as of 02/01/2019.      Review of Systems  Review of Systems  Constitutional: Negative for activity change, fatigue and fever.  HENT: Negative for sinus pressure, sinus pain and sore throat.   Respiratory: Negative for cough, shortness of breath and wheezing.   Cardiovascular: Positive for leg swelling. Negative for chest pain and palpitations.  Gastrointestinal: Negative for diarrhea, nausea and vomiting.  Musculoskeletal: Positive for back pain and myalgias. Negative for arthralgias.       Cramps in legs, thinks its from statins  Neurological: Negative for dizziness.  Psychiatric/Behavioral: Negative for sleep disturbance. The patient is not nervous/anxious.      Physical Exam  BP 118/62 (BP Location: Left Arm, Patient Position: Sitting, Cuff Size: Normal)   Pulse 62   Temp (!) 97.5 F (36.4 C) (Temporal)   Ht 4' 11"  (1.499 m)   Wt 167 lb 6.4 oz (75.9 kg)   SpO2 98%   BMI 33.81 kg/m   Wt Readings from Last 5 Encounters:  02/01/19 167 lb 6.4 oz  (75.9 kg)  10/06/18 165 lb (74.8 kg)  10/02/18 165 lb 6.4 oz (75 kg)  08/28/18 169 lb (76.7 kg)  04/10/18 172 lb (78 kg)    BMI Readings from Last 5 Encounters:  02/01/19 33.81 kg/m  10/06/18 33.33 kg/m  10/02/18 33.41 kg/m  08/28/18 33.01 kg/m  04/10/18 33.59 kg/m     Physical Exam Vitals signs and nursing note reviewed.  Constitutional:      General: She is not in acute distress.    Appearance: Normal appearance. She is obese.  HENT:     Head: Normocephalic and atraumatic.     Right Ear: External ear normal.     Left Ear: External ear normal.     Nose: Nose normal. No congestion or rhinorrhea.     Mouth/Throat:     Mouth: Mucous membranes are dry.     Pharynx: Oropharynx is clear.  Eyes:     Pupils: Pupils are equal, round, and reactive to light.  Neck:     Musculoskeletal: Normal range of motion.  Cardiovascular:     Rate and Rhythm: Normal rate and regular rhythm.     Pulses: Normal pulses.     Heart sounds:  Normal heart sounds. No murmur.  Pulmonary:     Effort: Pulmonary effort is normal. No respiratory distress.     Breath sounds: Normal breath sounds. No decreased air movement. No decreased breath sounds, wheezing or rales.  Abdominal:     General: Abdomen is flat. Bowel sounds are normal.     Palpations: Abdomen is soft. There is no mass.  Musculoskeletal:     Right lower leg: 1+ Edema present.     Left lower leg: 1+ Edema present.  Skin:    General: Skin is warm and dry.     Capillary Refill: Capillary refill takes less than 2 seconds.  Neurological:     General: No focal deficit present.     Mental Status: She is alert and oriented to person, place, and time. Mental status is at baseline.     Gait: Gait (Tolerated walk in office) normal.  Psychiatric:        Mood and Affect: Mood normal.        Behavior: Behavior normal.        Thought Content: Thought content normal.        Judgment: Judgment normal.       Assessment & Plan:   Chronic  respiratory failure ([place]) Plan: Walk today in office Continue oxygen therapy as prescribed We will order high-resolution CT chest to further evaluate interstitial lung disease  Interstitial lung disease ([place]) Plan: High-resolution CT chest ordered today Follow-up with our office in 6 to 8 weeks and establish care with Dr. Vaughan Browner Maintain oxygen saturations greater than 88% Walk today in office High-dose flu vaccine today  Back pain Intermittent chronic muscle spasms and back pain Patient unsure primary care is aware Patient reports that this is been going on for about 6 months Denies saddle paresthesias Denies loose bowels or urinary functioning  Plan: Follow-up with primary care Schedule acute visit with primary care  Healthcare maintenance Plan: High-dose flu vaccine today    Return in about 2 months (around 04/03/2019), or if symptoms worsen or fail to improve, for After Chest CT, Follow up with Dr. Vaughan Browner - 93mn visit to establish, for BQ pt.   BLauraine Rinne NP 02/01/2019   This appointment was 28 minutes long with over 50% of the time in direct face-to-face patient care, assessment, plan of care, and follow-up.

## 2019-02-01 ENCOUNTER — Ambulatory Visit (INDEPENDENT_AMBULATORY_CARE_PROVIDER_SITE_OTHER): Payer: Medicare HMO | Admitting: Pulmonary Disease

## 2019-02-01 ENCOUNTER — Telehealth: Payer: Self-pay | Admitting: Pulmonary Disease

## 2019-02-01 ENCOUNTER — Other Ambulatory Visit: Payer: Self-pay

## 2019-02-01 ENCOUNTER — Encounter: Payer: Self-pay | Admitting: Pulmonary Disease

## 2019-02-01 VITALS — BP 118/62 | HR 62 | Temp 97.5°F | Ht 59.0 in | Wt 167.4 lb

## 2019-02-01 DIAGNOSIS — J849 Interstitial pulmonary disease, unspecified: Secondary | ICD-10-CM

## 2019-02-01 DIAGNOSIS — M545 Low back pain, unspecified: Secondary | ICD-10-CM

## 2019-02-01 DIAGNOSIS — Z0000 Encounter for general adult medical examination without abnormal findings: Secondary | ICD-10-CM | POA: Diagnosis not present

## 2019-02-01 DIAGNOSIS — G8929 Other chronic pain: Secondary | ICD-10-CM | POA: Diagnosis not present

## 2019-02-01 DIAGNOSIS — J9621 Acute and chronic respiratory failure with hypoxia: Secondary | ICD-10-CM | POA: Diagnosis not present

## 2019-02-01 DIAGNOSIS — J9611 Chronic respiratory failure with hypoxia: Secondary | ICD-10-CM | POA: Diagnosis not present

## 2019-02-01 DIAGNOSIS — Z23 Encounter for immunization: Secondary | ICD-10-CM | POA: Diagnosis not present

## 2019-02-01 NOTE — Assessment & Plan Note (Signed)
Plan: Walk today in office Continue oxygen therapy as prescribed We will order high-resolution CT chest to further evaluate interstitial lung disease

## 2019-02-01 NOTE — Assessment & Plan Note (Signed)
Plan: High-dose flu vaccine today

## 2019-02-01 NOTE — Telephone Encounter (Signed)
Pt calling back to schedule a CT scan. Callback #: [phone]

## 2019-02-01 NOTE — Assessment & Plan Note (Signed)
Intermittent chronic muscle spasms and back pain Patient unsure primary care is aware Patient reports that this is been going on for about 6 months Denies saddle paresthesias Denies loose bowels or urinary functioning  Plan: Follow-up with primary care Schedule acute visit with primary care

## 2019-02-01 NOTE — Assessment & Plan Note (Signed)
Plan: High-resolution CT chest ordered today Follow-up with our office in 6 to 8 weeks and establish care with Dr. Vaughan Browner Maintain oxygen saturations greater than 88% Walk today in office High-dose flu vaccine today

## 2019-02-01 NOTE — Patient Instructions (Addendum)
You were seen today by Lauraine Rinne, NP  for:   1. Interstitial lung disease ([place])  - CT Chest High Resolution; Future  2. Pulmonary fibrosis (HCC)  - CT Chest High Resolution; Future  3. Chronic respiratory failure with hypoxia (HCC)  Walk today in clinic  Continue oxygen therapy as prescribed - 4L continuous  >>>maintain oxygen saturations greater than 88 percent  >>>if unable to maintain oxygen saturations please contact the office  >>>do not smoke with oxygen  >>>can use nasal saline gel or nasal saline rinses to moisturize nose if oxygen causes dryness  4. Healthcare maintenance  High dose flu vaccine    We recommend today:  Orders Placed This Encounter  Procedures   CT Chest High Resolution    -High-res CT with supine and prone positioning -inspiratory and expiratory cuts.  -Only to be read by Dr. Rosario Jacks and Dr. Weber Cooks.    Standing Status:   Future    Standing Expiration Date:   04/02/2020    Scheduling Instructions:     Complete by 03/03/2019    Order Specific Question:   Preferred imaging location?    Answer:   [address]    Order Specific Question:   Radiology Contrast Protocol - do NOT remove file path    Answer:   \[email or web address]   Orders Placed This Encounter  Procedures   CT Chest High Resolution   No orders of the defined types were placed in this encounter.   Follow Up:    Return in about 2 months (around 04/03/2019), or if symptoms worsen or fail to improve, for After Chest CT, Follow up with Dr. Vaughan Browner - 4min visit to establish, for BQ pt.   Please do your part to reduce the spread of COVID-19:      Reduce your risk of any infection  and COVID19 by using the similar precautions used for avoiding the common cold or flu:   Wash your hands often with soap and warm water for at least 20 seconds.  If soap and water are not readily available, use an alcohol-based hand sanitizer with at least 60%  alcohol.   If coughing or sneezing, cover your mouth and nose by coughing or sneezing into the elbow areas of your shirt or coat, into a tissue or into your sleeve (not your hands).  WEAR A MASK when in public   Avoid shaking hands with others and consider head nods or verbal greetings only.  Avoid touching your eyes, nose, or mouth with unwashed hands.   Avoid close contact with people who are sick.  Avoid places or events with large numbers of people in one location, like concerts or sporting events.  If you have some symptoms but not all symptoms, continue to monitor at home and seek medical attention if your symptoms worsen.  If you are having a medical emergency, call 911.   [place] / e-Visit: [email or web address]         [facility]: [facility]: [phone]                   [facility]: [phone]     It is flu season:   >>> Best ways to protect herself from the flu: Receive the yearly flu vaccine, practice good hand hygiene washing with soap and also using hand sanitizer when available, eat a nutritious meals, get adequate rest, hydrate  appropriately   Please contact the office if your symptoms worsen or you have concerns that you are not improving.   Thank you for choosing [facility] for your healthcare, and for allowing [place] to partner with you on your healthcare journey. I am thankful to be able to provide care to you today.   Wyn Quaker FNP-C   Influenza Virus Vaccine injection What is this medicine? INFLUENZA VIRUS VACCINE (in floo EN zuh VAHY ruhs vak SEEN) helps to reduce the risk of getting influenza also known as the flu. The vaccine only helps protect you against some strains of the flu. This medicine may be used for other purposes; ask your health care provider or pharmacist if you have  questions. COMMON BRAND NAME(S): Afluria, Afluria Quadrivalent, Agriflu, Alfuria, FLUAD, Fluarix, Fluarix Quadrivalent, Flublok, Flublok Quadrivalent, FLUCELVAX, Flulaval, Fluvirin, Fluzone, Fluzone High-Dose, Fluzone Intradermal What should I tell my health care provider before I take this medicine? They need to know if you have any of these conditions:  bleeding disorder like hemophilia  fever or infection  Guillain-Barre syndrome or other neurological problems  immune system problems  infection with the human immunodeficiency virus (HIV) or AIDS  low blood platelet counts  multiple sclerosis  an unusual or allergic reaction to influenza virus vaccine, latex, other medicines, foods, dyes, or preservatives. Different brands of vaccines contain different allergens. Some may contain latex or eggs. Talk to your doctor about your allergies to make sure that you get the right vaccine.  pregnant or trying to get pregnant  breast-feeding How should I use this medicine? This vaccine is for injection into a muscle or under the skin. It is given by a health care professional. A copy of Vaccine Information Statements will be given before each vaccination. Read this sheet carefully each time. The sheet may change frequently. Talk to your healthcare provider to see which vaccines are right for you. Some vaccines should not be used in all age groups. Overdosage: If you think you have taken too much of this medicine contact a poison control center or emergency room at once. NOTE: This medicine is only for you. Do not share this medicine with others. What if I miss a dose? This does not apply. What may interact with this medicine?  chemotherapy or radiation therapy  medicines that lower your immune system like etanercept, anakinra, infliximab, and adalimumab  medicines that treat or prevent blood clots like warfarin  phenytoin  steroid medicines like prednisone or  cortisone  theophylline  vaccines This list may not describe all possible interactions. Give your health care provider a list of all the medicines, herbs, non-prescription drugs, or dietary supplements you use. Also tell them if you smoke, drink alcohol, or use illegal drugs. Some items may interact with your medicine. What should I watch for while using this medicine? Report any side effects that do not go away within 3 days to your doctor or health care professional. Call your health care provider if any unusual symptoms occur within 6 weeks of receiving this vaccine. You may still catch the flu, but the illness is not usually as bad. You cannot get the flu from the vaccine. The vaccine will not protect against colds or other illnesses that may cause fever. The vaccine is needed every year. What side effects may I notice from receiving this medicine? Side effects that you should report to your doctor or health care professional as soon as possible:  allergic reactions like skin rash, itching  or hives, swelling of the face, lips, or tongue Side effects that usually do not require medical attention (report to your doctor or health care professional if they continue or are bothersome):  fever  headache  muscle aches and pains  pain, tenderness, redness, or swelling at the injection site  tiredness This list may not describe all possible side effects. Call your doctor for medical advice about side effects. You may report side effects to FDA at 1-800-FDA-1088. Where should I keep my medicine? The vaccine will be given by a health care professional in a clinic, pharmacy, doctor's office, or other health care setting. You will not be given vaccine doses to store at home. NOTE: This sheet is a summary. It may not cover all possible information. If you have questions about this medicine, talk to your doctor, pharmacist, or health care provider.  2020 Elsevier/Gold Standard (2018-02-17  08:45:43)

## 2019-02-01 NOTE — Addendum Note (Signed)
Addended by: Valerie Salts on: 02/01/2019 02:30 PM   Modules accepted: Orders

## 2019-02-02 NOTE — Telephone Encounter (Signed)
Noted.  Will close encounter.  

## 2019-02-02 NOTE — Telephone Encounter (Signed)
lmtcb pt has been set up with chest ct 02/08/19 need to verify Dawn Gomez

## 2019-02-02 NOTE — Telephone Encounter (Signed)
Pt called back - advised of CT appt -pr

## 2019-02-03 DIAGNOSIS — J449 Chronic obstructive pulmonary disease, unspecified: Secondary | ICD-10-CM | POA: Diagnosis not present

## 2019-02-03 DIAGNOSIS — J8410 Pulmonary fibrosis, unspecified: Secondary | ICD-10-CM | POA: Diagnosis not present

## 2019-02-03 DIAGNOSIS — G4733 Obstructive sleep apnea (adult) (pediatric): Secondary | ICD-10-CM | POA: Diagnosis not present

## 2019-02-03 DIAGNOSIS — J9621 Acute and chronic respiratory failure with hypoxia: Secondary | ICD-10-CM | POA: Diagnosis not present

## 2019-02-08 ENCOUNTER — Inpatient Hospital Stay: Admission: RE | Admit: 2019-02-08 | Payer: Medicare HMO | Source: Ambulatory Visit

## 2019-02-12 DIAGNOSIS — J8410 Pulmonary fibrosis, unspecified: Secondary | ICD-10-CM | POA: Diagnosis not present

## 2019-02-12 DIAGNOSIS — G4733 Obstructive sleep apnea (adult) (pediatric): Secondary | ICD-10-CM | POA: Diagnosis not present

## 2019-02-12 DIAGNOSIS — J449 Chronic obstructive pulmonary disease, unspecified: Secondary | ICD-10-CM | POA: Diagnosis not present

## 2019-02-12 DIAGNOSIS — J9621 Acute and chronic respiratory failure with hypoxia: Secondary | ICD-10-CM | POA: Diagnosis not present

## 2019-02-23 ENCOUNTER — Ambulatory Visit (INDEPENDENT_AMBULATORY_CARE_PROVIDER_SITE_OTHER)
Admission: RE | Admit: 2019-02-23 | Discharge: 2019-02-23 | Disposition: A | Discharge status: Home / Self Care | Payer: Medicare HMO | Source: Ambulatory Visit | Attending: Pulmonary Disease | Admitting: Pulmonary Disease

## 2019-02-23 ENCOUNTER — Other Ambulatory Visit: Payer: Self-pay

## 2019-02-23 DIAGNOSIS — J849 Interstitial pulmonary disease, unspecified: Secondary | ICD-10-CM

## 2019-02-23 NOTE — Progress Notes (Signed)
Stable high-resolution CT chest.  Fibrosis is not showing progression.  This is good news.  This can be reviewed with you in more detail at your next office visit when you establish care with Dr. Vaughan Browner.  Wyn Quaker, FNP

## 2019-03-03 ENCOUNTER — Encounter: Payer: Self-pay | Admitting: Internal Medicine

## 2019-03-03 ENCOUNTER — Ambulatory Visit (INDEPENDENT_AMBULATORY_CARE_PROVIDER_SITE_OTHER): Payer: Medicare HMO | Admitting: Internal Medicine

## 2019-03-03 ENCOUNTER — Other Ambulatory Visit: Payer: Self-pay

## 2019-03-03 ENCOUNTER — Other Ambulatory Visit: Payer: Self-pay | Admitting: Internal Medicine

## 2019-03-03 VITALS — BP 116/64 | HR 60 | Temp 98.1°F | Ht 59.0 in | Wt 169.0 lb

## 2019-03-03 DIAGNOSIS — E039 Hypothyroidism, unspecified: Secondary | ICD-10-CM

## 2019-03-03 DIAGNOSIS — J439 Emphysema, unspecified: Secondary | ICD-10-CM | POA: Diagnosis not present

## 2019-03-03 DIAGNOSIS — R739 Hyperglycemia, unspecified: Secondary | ICD-10-CM

## 2019-03-03 DIAGNOSIS — I700 Atherosclerosis of aorta: Secondary | ICD-10-CM | POA: Diagnosis not present

## 2019-03-03 DIAGNOSIS — G8929 Other chronic pain: Secondary | ICD-10-CM

## 2019-03-03 DIAGNOSIS — M545 Low back pain: Secondary | ICD-10-CM | POA: Diagnosis not present

## 2019-03-03 DIAGNOSIS — J309 Allergic rhinitis, unspecified: Secondary | ICD-10-CM

## 2019-03-03 DIAGNOSIS — E782 Mixed hyperlipidemia: Secondary | ICD-10-CM | POA: Diagnosis not present

## 2019-03-03 HISTORY — DX: Atherosclerosis of aorta: I70.0

## 2019-03-03 MED ORDER — MELOXICAM 7.5 MG PO TABS: 7.5000 mg | ORAL_TABLET | Freq: Every day | ORAL | 2 refills | Status: DC

## 2019-03-03 MED ORDER — FUROSEMIDE 80 MG PO TABS: ORAL_TABLET | ORAL | 3 refills | Status: DC

## 2019-03-03 MED ORDER — TIZANIDINE HCL 2 MG PO CAPS: 2.0000 mg | ORAL_CAPSULE | Freq: Three times a day (TID) | ORAL | 2 refills | Status: DC

## 2019-03-03 NOTE — Progress Notes (Signed)
Subjective:    Patient ID: Dawn Gomez, female    DOB: 1935-07-03, 83 y.o.   MRN: [id number]  HPI  Here to f/u with c/o acute onset pain to the back under both shoulder blades on both sides, mild to mod, constant, x 2 wks, was worse with either arm movement to start but then did seem some improved with arm excercises, pain still persists to the back, no neck pain or radicular symptoms, recent CT chest negative.  CT chest nov 17 with ILD, emphysema and aortic atherosclerosis. Does have several wks ongoing nasal allergy symptoms with clearish congestion, itch and sneezing, without fever, pain, ST, cough, swelling or wheezing, but plans to restart her nasacort as nasacort really helped the nasal allergies recently.  Pt denies chest pain, increased sob or doe, wheezing, orthopnea, PND, palpitations, dizziness or syncope, but has had mild maybe somewhat worsening swelling to the legs despite good compliance with lasix 80 qam  Denies hyper or hypo thyroid symptoms such as voice, skin or hair change. Past Medical History:  Diagnosis Date  . Abnormal heart rhythm   . Aortic aneurysm ([place])   . Aortic atherosclerosis ([place]) 03/03/2019  . Dyspnea   . Hypothyroid   . OSA (obstructive sleep apnea)    on cpap  . Psoriasis 02/05/2017  . Pulmonary fibrosis ([place])   . SVT (supraventricular tachycardia) ([place])   . Thyroid disease    hypo   Past Surgical History:  Procedure Laterality Date  . ABDOMINAL HYSTERECTOMY    . APPENDECTOMY    . CHOLECYSTECTOMY    . CRANIOTOMY     for aneurysms  . CRANIOTOMY  1980 x 2   aneurysmal clipping  . HERNIA REPAIR    . TOTAL ABDOMINAL HYSTERECTOMY    . TUBAL LIGATION      reports that she quit smoking about 12 years ago. Her smoking use included cigarettes. She has a 100.00 pack-year smoking history. She has never used smokeless tobacco. She reports current alcohol use. She reports that she does not use drugs. family history includes Allergies in her sister; Bone  cancer in her father; Breast cancer in her sister; Cancer in her sister; Clotting disorder in her father and sister; Emphysema in her father, mother, and sister; Heart disease in her father and mother; Lung cancer in her father and mother; Prostate cancer in her father; Rheum arthritis in her father, mother, and sister. Allergies  Allergen Reactions  . Lipitor [Atorvastatin] Shortness Of Breath, Diarrhea and Other (See Comments)    Dizzy, pain all over.  . Codeine   . Hydromorphone Hcl     REACTION: dementia  . Levaquin [Levofloxacin In D5w]     Shock per pt but can take cipro  . Morphine   . Oxycodone-Acetaminophen   . Propoxyphene N-Acetaminophen   . Simvastatin    Current Outpatient Medications on File Prior to Visit  Medication Sig Dispense Refill  . acetaminophen (TYLENOL) 500 MG tablet Take 500 mg by mouth every 6 (six) hours as needed.    . Ascorbic Acid (VITAMIN C PO) Take by mouth.    Marland Kitchen aspirin EC 81 MG tablet Take 81 mg by mouth daily.    . blood glucose meter kit and supplies Dispense based on patient and insurance preference. Use up to four times daily as directed. (FOR ICD-10 E11.9). 1 each 0  . CALCIUM-MAGNESIUM-ZINC PO Take 1 tablet by mouth daily.    . cholecalciferol (VITAMIN D) 1000 units tablet Take 5,000  Units by mouth daily.    Marland Kitchen diltiazem (CARDIZEM CD) 180 MG 24 hr capsule Take 1 capsule (180 mg total) by mouth daily. 90 capsule 2  . escitalopram (LEXAPRO) 20 MG tablet TAKE 1 TABLET (20 MG TOTAL) BY MOUTH AT BEDTIME. 90 tablet 2  . fenofibrate 160 MG tablet Take 1 tablet (160 mg total) by mouth daily. 90 tablet 1  . fluconazole (DIFLUCAN) 150 MG tablet 1 tab by mouth every 3 days as needed 2 tablet 1  . fluocinonide (LIDEX) 0.05 % external solution APP EXT AA OF SCALP NIGHTLY  3  . ipratropium-albuterol (DUONEB) 0.5-2.5 (3) MG/3ML SOLN Take 3 mLs by nebulization every 4 (four) hours as needed. 360 mL 0  . levothyroxine (SYNTHROID) 100 MCG tablet Take 1 tablet (100  mcg total) by mouth daily before breakfast. 90 tablet 3  . lisinopril (ZESTRIL) 5 MG tablet Take 1 tablet (5 mg total) by mouth daily. 90 tablet 3  . potassium chloride SA (K-DUR) 20 MEQ tablet TAKE 1 TABLET (20 MEQ) BY MOUTH DAILY WITH FOOD 90 tablet 1  . rosuvastatin (CRESTOR) 20 MG tablet Take 1 tablet (20 mg total) by mouth daily. 90 tablet 3  . traMADol (ULTRAM) 50 MG tablet Take 1 tablet (50 mg total) by mouth 2 (two) times daily. 180 tablet 1  . traZODone (DESYREL) 50 MG tablet Take 1 tablet (50 mg total) by mouth at bedtime as needed for sleep. 90 tablet 1  . triamcinolone (NASACORT) 55 MCG/ACT AERO nasal inhaler Place 2 sprays into the nose daily. 3 Inhaler 3  . vitamin B-12 (CYANOCOBALAMIN) 1000 MCG tablet Take 500 mcg by mouth daily.    . vitamin C (ASCORBIC ACID) 500 MG tablet Take 500 mg by mouth daily.    . Vitamin D, Ergocalciferol, (DRISDOL) 50000 units CAPS capsule Take 1 capsule (50,000 Units total) by mouth every 7 (seven) days. 12 capsule 0  . vitamin E 400 UNIT capsule Take 200 Units by mouth daily.     No current facility-administered medications on file prior to visit.    Review of Systems  Constitutional: Negative for other unusual diaphoresis or sweats HENT: Negative for ear discharge or swelling Eyes: Negative for other worsening visual disturbances Respiratory: Negative for stridor or other swelling  Gastrointestinal: Negative for worsening distension or other blood Genitourinary: Negative for retention or other urinary change Musculoskeletal: Negative for other MSK pain or swelling Skin: Negative for color change or other new lesions Neurological: Negative for worsening tremors and other numbness  Psychiatric/Behavioral: Negative for worsening agitation or other fatigue All otherwise neg per pt     Objective:   Physical Exam BP 116/64   Pulse 60   Temp 98.1 F (36.7 C) (Oral)   Ht _0  (1.499 m)   Wt 169 lb (76.7 kg)   SpO2 98%   BMI 34.13 kg/m  VS  noted,  Constitutional: Pt appears in NAD HENT: Head: NCAT.  Right Ear: External ear normal.  Left Ear: External ear normal.  Eyes: . Pupils are equal, round, and reactive to light. Conjunctivae and EOM are normal Bilat tm's with mild erythema.  Max sinus areas non tender.  Pharynx with mild erythema, no exudate Nose: without d/c or deformity Neck: Neck supple. Gross normal ROM Cardiovascular: Normal rate and regular rhythm.   Pulmonary/Chest: Effort normal and breath sounds without rales or wheezing MSK: tender bilateral mid thoracic paravertebral tender, spine nontender in midline without rash or swelling .  Abd:  Soft,  NT, ND, + BS, no organomegaly Neurological: Pt is alert. At baseline orientation, motor grossly intact Skin: Skin is warm. No rashes, other new lesions, trace bilat LE edema Psychiatric: Pt behavior is normal without agitation  All otherwise neg per pt Lab Results  Component Value Date   WBC 6.2 10/06/2018   HGB 12.6 10/06/2018   HCT 37.5 10/06/2018   PLT 233.0 10/06/2018   GLUCOSE 81 10/06/2018   CHOL 121 10/06/2018   TRIG 152.0 (H) 10/06/2018   HDL 50.20 10/06/2018   LDLDIRECT 138.0 02/05/2017   LDLCALC 40 10/06/2018   ALT 16 10/06/2018   AST 20 10/06/2018   NA 137 10/06/2018   K 4.0 10/06/2018   CL 99 10/06/2018   CREATININE 1.01 10/06/2018   BUN 16 10/06/2018   CO2 26 10/06/2018   TSH 0.71 10/06/2018   HGBA1C 5.6 10/06/2018        Assessment & Plan:

## 2019-03-03 NOTE — Patient Instructions (Signed)
Please take all new medication as prescribed - the anti-inflammatory and muscle relaxer as needed  Please continue all other medications as before, including the tramadol as needed  OK to change the lasix to take 80 mg every AM, but also 40 mg (half tab) in the PM for persistent swelling  Please have the pharmacy call with any other refills you may need.  Please continue your efforts at being more active, low cholesterol diet, and weight control.  You are otherwise up to date with prevention measures today.  Please keep your appointments with your specialists as you may have planned

## 2019-03-05 ENCOUNTER — Encounter: Payer: Self-pay | Admitting: Internal Medicine

## 2019-03-05 NOTE — Assessment & Plan Note (Signed)
stable overall by history and exam, recent data reviewed with pt, and pt to continue medical treatment as before,  to f/u any worsening symptoms or concerns  

## 2019-03-05 NOTE — Assessment & Plan Note (Signed)
Improved, cont same tx

## 2019-03-05 NOTE — Assessment & Plan Note (Addendum)
stable overall by history and exam, recent data reviewed with pt, and pt to continue medical treatment as before except for increased lasix 80 qam, and 40 qpm prn persistent swelling,  to f/u any worsening symptoms or concerns

## 2019-03-05 NOTE — Assessment & Plan Note (Addendum)
C/w msk, for cont tramadol, mobic 7.5 prn, tizanidien 2 yid prn,  to f/u any worsening symptoms or concerns  Note:  Total time for pt hx, exam, review of record with pt in the room, determination of diagnoses and plan for further eval and tx is > 40 min, with over 50% spent in coordination and counseling of patient including the differential dx, tx, further evaluation and other management of back pain , allergies, peripheral edema, copd, aortic atherosclerotis, hypothyroidism, HLD, hyperglycemia

## 2019-03-05 NOTE — Assessment & Plan Note (Signed)
To continue crestor 20 qd,  to f/u any worsening symptoms or concerns

## 2019-03-05 NOTE — Assessment & Plan Note (Signed)
D/w pt, for cardiac risk factor mod, low chol diet, has been simvastatin intolerant

## 2019-03-08 ENCOUNTER — Other Ambulatory Visit: Payer: Self-pay | Admitting: Internal Medicine

## 2019-03-08 ENCOUNTER — Telehealth: Payer: Self-pay | Admitting: Internal Medicine

## 2019-03-08 DIAGNOSIS — Z1231 Encounter for screening mammogram for malignant neoplasm of breast: Secondary | ICD-10-CM

## 2019-03-08 MED ORDER — TRAZODONE HCL 50 MG PO TABS: 50.0000 mg | ORAL_TABLET | Freq: Every evening | ORAL | 1 refills | Status: DC | PRN

## 2019-03-08 NOTE — Telephone Encounter (Signed)
Refill re- sent. See meds.

## 2019-03-08 NOTE — Telephone Encounter (Signed)
Patient stated that [facility], Fax # [phone] said they have not received the patient's traZODone (DESYREL) 50 MG tablet prescription. Please advise.

## 2019-03-10 ENCOUNTER — Other Ambulatory Visit: Payer: Self-pay | Admitting: Internal Medicine

## 2019-03-10 MED ORDER — POTASSIUM CHLORIDE CRYS ER 20 MEQ PO TBCR: EXTENDED_RELEASE_TABLET | ORAL | 0 refills | Status: DC

## 2019-03-10 NOTE — Telephone Encounter (Signed)
Copied from [place] [id number]. Topic: Quick Communication - Rx Refill/Question >> Mar 10, 2019 12:06 PM Dawn Gomez, [place] A wrote: Medication: potassium chloride SA (K-DUR) 20 MEQ tablet Patient wanting 15-20 tablets sent over until [facility] order is delivered.) Has the patient contacted their pharmacy? Yes (Agent: If no, request that the patient contact the pharmacy for the refill.) (Agent: If yes, when and what did the pharmacy advise?)Contact PCP  Preferred Pharmacy (with phone number or street name):[place] ([place]), [place] - [address] [phone] (Phone) [phone] (Fax)    Agent: Please be advised that RX refills may take up to 3 business days. We ask that you follow-up with your pharmacy.

## 2019-03-10 NOTE — Telephone Encounter (Signed)
Requested Prescriptions  Pending Prescriptions Disp Refills  . potassium chloride SA (KLOR-CON) 20 MEQ tablet 30 tablet 0    Sig: TAKE 1 TABLET (20 MEQ) BY MOUTH DAILY WITH FOOD     Endocrinology:  Minerals - Potassium Supplementation Passed - 03/10/2019 12:25 PM      Passed - K in normal range and within 360 days    Potassium  Date Value Ref Range Status  10/06/2018 4.0 3.5 - 5.1 mEq/L Final         Passed - Cr in normal range and within 360 days    Creatinine, Ser  Date Value Ref Range Status  10/06/2018 1.01 0.40 - 1.20 mg/dL Final         Passed - Valid encounter within last 12 months    Recent Outpatient Visits          1 week ago Hyperglycemia   [place], MD   5 months ago Preventative health care   [facility] -Georges Mouse, MD   6 months ago Sinusitis, unspecified chronicity, unspecified location   [place] [facility], James W, MD   11 months ago [facility] -Georges Mouse, MD   11 months ago Hypothyroidism, unspecified type   [place], James W, MD      Future Appointments            In 2 weeks Marshell Garfinkel, MD [facility]   In 4 weeks Jenny Reichmann, Hunt Oris, MD [place], [facility]

## 2019-03-10 NOTE — Telephone Encounter (Signed)
Please see encounter below

## 2019-03-14 DIAGNOSIS — J8410 Pulmonary fibrosis, unspecified: Secondary | ICD-10-CM | POA: Diagnosis not present

## 2019-03-14 DIAGNOSIS — G4733 Obstructive sleep apnea (adult) (pediatric): Secondary | ICD-10-CM | POA: Diagnosis not present

## 2019-03-14 DIAGNOSIS — J449 Chronic obstructive pulmonary disease, unspecified: Secondary | ICD-10-CM | POA: Diagnosis not present

## 2019-03-14 DIAGNOSIS — J9621 Acute and chronic respiratory failure with hypoxia: Secondary | ICD-10-CM | POA: Diagnosis not present

## 2019-03-26 ENCOUNTER — Ambulatory Visit (INDEPENDENT_AMBULATORY_CARE_PROVIDER_SITE_OTHER): Payer: Medicare HMO | Admitting: Pulmonary Disease

## 2019-03-26 ENCOUNTER — Encounter: Payer: Self-pay | Admitting: Pulmonary Disease

## 2019-03-26 ENCOUNTER — Other Ambulatory Visit: Payer: Self-pay

## 2019-03-26 VITALS — BP 130/70 | HR 60 | Temp 97.4°F | Ht 59.0 in | Wt 171.4 lb

## 2019-03-26 DIAGNOSIS — J849 Interstitial pulmonary disease, unspecified: Secondary | ICD-10-CM

## 2019-03-26 DIAGNOSIS — J9611 Chronic respiratory failure with hypoxia: Secondary | ICD-10-CM

## 2019-03-26 DIAGNOSIS — J432 Centrilobular emphysema: Secondary | ICD-10-CM

## 2019-03-26 NOTE — Progress Notes (Signed)
Dawn Gomez    [id number]    Mar 07, 1936  Primary Care Physician:John, Hunt Oris, MD  Referring Physician: Biagio Borg, MD [place],  [place] [address]  Chief complaint: Follow-up for COPD, pulmonary fibrosis  HPI: 83 year old ex-smoker with chronic respiratory failure, emphysema, pulmonary fibrosis.  Previously followed by Dr. Lake Bells She has emphysema which is managed with duo nebs and supplemental oxygen Also has history of unspecified pulmonary fibrosis which has been stable over the years.  She is currently not on any treatment for pulmonary fibrosis  States that her dyspnea is stable with no issues.  Denies any cough, sputum production, fevers, chills  Pets: Has a dog  Occupation: Homemaker Exposures: No known exposures.  No mold, hot tub, Jacuzzi, no down pillows or comforters Smoking history: 100-pack-year smoker.  Quit in 2008 Travel history: No significant travel history Relevant family history: No significant family history of lung disease  Outpatient Encounter Medications as of 03/26/2019  Medication Sig  . acetaminophen (TYLENOL) 500 MG tablet Take 500 mg by mouth every 6 (six) hours as needed.  . Ascorbic Acid (VITAMIN C PO) Take by mouth.  Marland Kitchen aspirin EC 81 MG tablet Take 81 mg by mouth daily.  . blood glucose meter kit and supplies Dispense based on patient and insurance preference. Use up to four times daily as directed. (FOR ICD-10 E11.9).  . CALCIUM-MAGNESIUM-ZINC PO Take 1 tablet by mouth daily.  . cholecalciferol (VITAMIN D) 1000 units tablet Take 5,000 Units by mouth daily.  Marland Kitchen diltiazem (CARDIZEM CD) 180 MG 24 hr capsule Take 1 capsule (180 mg total) by mouth daily.  Marland Kitchen escitalopram (LEXAPRO) 20 MG tablet TAKE 1 TABLET (20 MG TOTAL) BY MOUTH AT BEDTIME.  . fenofibrate 160 MG tablet Take 1 tablet (160 mg total) by mouth daily.  . fluconazole (DIFLUCAN) 150 MG tablet 1 tab by mouth every 3 days as needed  . fluocinonide (LIDEX)  0.05 % external solution APP EXT AA OF SCALP NIGHTLY  . furosemide (LASIX) 80 MG tablet 1 tab by mouth every am, and 1/2 tab in the PM as needed for persistent swelling only  . ipratropium-albuterol (DUONEB) 0.5-2.5 (3) MG/3ML SOLN Take 3 mLs by nebulization every 4 (four) hours as needed.  Marland Kitchen levothyroxine (SYNTHROID) 100 MCG tablet Take 1 tablet (100 mcg total) by mouth daily before breakfast.  . lisinopril (ZESTRIL) 5 MG tablet Take 1 tablet (5 mg total) by mouth daily.  . meloxicam (MOBIC) 7.5 MG tablet Take 1 tablet (7.5 mg total) by mouth daily.  . potassium chloride SA (KLOR-CON) 20 MEQ tablet TAKE 1 TABLET (20 MEQ) BY MOUTH DAILY WITH FOOD  . rosuvastatin (CRESTOR) 20 MG tablet Take 1 tablet (20 mg total) by mouth daily.  . tizanidine (ZANAFLEX) 2 MG capsule Take 1 capsule (2 mg total) by mouth 3 (three) times daily.  . traMADol (ULTRAM) 50 MG tablet Take 1 tablet (50 mg total) by mouth 2 (two) times daily.  . traZODone (DESYREL) 50 MG tablet Take 1 tablet (50 mg total) by mouth at bedtime as needed for sleep.  Marland Kitchen triamcinolone (NASACORT) 55 MCG/ACT AERO nasal inhaler Place 2 sprays into the nose daily.  . vitamin B-12 (CYANOCOBALAMIN) 1000 MCG tablet Take 500 mcg by mouth daily.  . vitamin C (ASCORBIC ACID) 500 MG tablet Take 500 mg by mouth daily.  . Vitamin D, Ergocalciferol, (DRISDOL) 50000 units CAPS capsule Take 1 capsule (50,000 Units  total) by mouth every 7 (seven) days.  . vitamin E 400 UNIT capsule Take 200 Units by mouth daily.   No facility-administered encounter medications on file as of 03/26/2019.    Allergies as of 03/26/2019 - Review Complete 03/26/2019  Allergen Reaction Noted  . Lipitor [atorvastatin] Shortness Of Breath, Diarrhea, and Other (See Comments) 08/05/2016  . Codeine    . Hydromorphone hcl    . Levaquin [levofloxacin in d5w]  07/08/2017  . Morphine    . Oxycodone-acetaminophen  06/26/2007  . Propoxyphene n-acetaminophen    . Simvastatin  10/16/2010     Past Medical History:  Diagnosis Date  . Abnormal heart rhythm   . Aortic aneurysm ([place])   . Aortic atherosclerosis ([place]) 03/03/2019  . Dyspnea   . Hypothyroid   . OSA (obstructive sleep apnea)    on cpap  . Psoriasis 02/05/2017  . Pulmonary fibrosis ([place])   . SVT (supraventricular tachycardia) ([place])   . Thyroid disease    hypo    Past Surgical History:  Procedure Laterality Date  . ABDOMINAL HYSTERECTOMY    . APPENDECTOMY    . CHOLECYSTECTOMY    . CRANIOTOMY     for aneurysms  . CRANIOTOMY  1980 x 2   aneurysmal clipping  . HERNIA REPAIR    . TOTAL ABDOMINAL HYSTERECTOMY    . TUBAL LIGATION      Family History  Problem Relation Age of Onset  . Emphysema Father   . Heart disease Father   . Clotting disorder Father   . Rheum arthritis Father   . Lung cancer Father   . Prostate cancer Father   . Bone cancer Father   . Emphysema Sister   . Emphysema Mother   . Heart disease Mother   . Rheum arthritis Mother   . Lung cancer Mother   . Allergies Sister   . Clotting disorder Sister   . Rheum arthritis Sister   . Breast cancer Sister   . Cancer Sister        ureter    Social History   Socioeconomic History  . Marital status: Single    Spouse name: Not on file  . Number of children: 1  . Years of education: 8  . Highest education level: Not on file  Occupational History  . Occupation: retired Therapist, sports  . Occupation: retired  Tobacco Use  . Smoking status: Former Smoker    Packs/day: 2.00    Years: 50.00    Pack years: 100.00    Types: Cigarettes    Quit date: 2008    Years since quitting: 12.9  . Smokeless tobacco: Never Used  Substance and Sexual Activity  . Alcohol use: Yes    Comment: "drank back in her younger wilder days"  . Drug use: No  . Sexual activity: Not on file  Other Topics Concern  . Not on file  Social History Narrative   ** Merged History Encounter **       Social Determinants of Health   Financial Resource Strain:   .  Difficulty of Paying Living Expenses: Not on file  Food Insecurity:   . Worried About Charity fundraiser in the Last Year: Not on file  . Ran Out of Food in the Last Year: Not on file  Transportation Needs:   . Lack of Transportation (Medical): Not on file  . Lack of Transportation (Non-Medical): Not on file  Physical Activity:   . Days of Exercise per Week: Not  on file  . Minutes of Exercise per Session: Not on file  Stress:   . Feeling of Stress : Not on file  Social Connections:   . Frequency of Communication with Friends and Family: Not on file  . Frequency of Social Gatherings with Friends and Family: Not on file  . Attends Religious Services: Not on file  . Active Member of Clubs or Organizations: Not on file  . Attends Archivist Meetings: Not on file  . Marital Status: Not on file  Intimate Partner Violence:   . Fear of Current or Ex-Partner: Not on file  . Emotionally Abused: Not on file  . Physically Abused: Not on file  . Sexually Abused: Not on file    Review of systems: Review of Systems  Constitutional: Negative for fever and chills.  HENT: Negative.   Eyes: Negative for blurred vision.  Respiratory: as per HPI  Cardiovascular: Negative for chest pain and palpitations.  Gastrointestinal: Negative for vomiting, diarrhea, blood per rectum. Genitourinary: Negative for dysuria, urgency, frequency and hematuria.  Musculoskeletal: Negative for myalgias, back pain and joint pain.  Skin: Negative for itching and rash.  Neurological: Negative for dizziness, tremors, focal weakness, seizures and loss of consciousness.  Endo/Heme/Allergies: Negative for environmental allergies.  Psychiatric/Behavioral: Negative for depression, suicidal ideas and hallucinations.  All other systems reviewed and are negative.  Physical Exam: Blood pressure 130/70, pulse 60, temperature (!) 97.4 F (36.3 C), height 4' 11"  (1.499 m), weight 171 lb 6.4 oz (77.7 kg), SpO2 100  %. Gen:      No acute distress HEENT:  EOMI, sclera anicteric Neck:     No masses; no thyromegaly Lungs:    Clear to auscultation bilaterally; normal respiratory effort CV:         Regular rate and rhythm; no murmurs Abd:      + bowel sounds; soft, non-tender; no palpable masses, no distension Ext:    No edema; adequate peripheral perfusion Skin:      Warm and dry; no rash Neuro: alert and oriented x 3 Psych: normal mood and affect  Data Reviewed: Imaging: CT high-resolution 12/24/2018-fibrotic interstitial lung disease with central and peripheral involvement.  No basal gradient with no progression.  Alternate diagnosis Emphysematous changes.  PFTs: 02/09/2018 FVC 1.71 [78%], FEV1 1.50 [93%], F/F 88, TLC 3.29 [73%], DLCO 5.72 [30%] Mild restriction with severely reduced diffusion capacity Stable lung function compared to 2017  Labs: 2009:  ANA, RF, ACE, ESR, all ok.  Autoimmune 01/2014:  Negative 04/2015 ANA, RF, CCP, JO-1, SSA, SSB, SCL-70, Anti-centromere all negative  Assessment:  Chronic respiratory failure, emphysema Continues on nebulizer therapy supplemental oxygen  Interstitial lung disease CT reviewed with nonspecific fibrotic changes in alternate pattern.  This has remained stable on PFTs and CT scan over many years Continue to observe this.  I do not believe will need aggressive work-up or treatment at this point.  Plan/Recommendations: -Continue nebulizers  Follow-up in 6  Marshell Garfinkel MD [facility] 03/26/2019, 1:46 PM  CC: Biagio Borg, MD

## 2019-03-26 NOTE — Patient Instructions (Signed)
Glad you are doing well with your breathing Continue your duo nebs as prescribed Your CT scan shows stable lung fibrosis  Follow-up in 6 months

## 2019-03-29 ENCOUNTER — Other Ambulatory Visit: Payer: Self-pay | Admitting: *Deleted

## 2019-03-29 MED ORDER — TRAMADOL HCL 50 MG PO TABS: 50.0000 mg | ORAL_TABLET | Freq: Two times a day (BID) | ORAL | 1 refills | Status: DC

## 2019-03-29 MED ORDER — POTASSIUM CHLORIDE CRYS ER 20 MEQ PO TBCR: EXTENDED_RELEASE_TABLET | ORAL | 3 refills | Status: DC

## 2019-03-29 NOTE — Telephone Encounter (Signed)
[place] Controlled Database Checked Last filled: 02/09/19 # 60 LOV w/you: 03/03/19 Next appt w/you: 04/07/19

## 2019-03-29 NOTE — Telephone Encounter (Signed)
Done erx 

## 2019-04-07 ENCOUNTER — Ambulatory Visit: Payer: Medicare HMO | Admitting: Internal Medicine

## 2019-04-14 DIAGNOSIS — J9621 Acute and chronic respiratory failure with hypoxia: Secondary | ICD-10-CM | POA: Diagnosis not present

## 2019-04-14 DIAGNOSIS — J8410 Pulmonary fibrosis, unspecified: Secondary | ICD-10-CM | POA: Diagnosis not present

## 2019-04-14 DIAGNOSIS — G4733 Obstructive sleep apnea (adult) (pediatric): Secondary | ICD-10-CM | POA: Diagnosis not present

## 2019-04-14 DIAGNOSIS — J449 Chronic obstructive pulmonary disease, unspecified: Secondary | ICD-10-CM | POA: Diagnosis not present

## 2019-04-21 ENCOUNTER — Other Ambulatory Visit: Payer: Self-pay | Admitting: Internal Medicine

## 2019-04-28 ENCOUNTER — Ambulatory Visit: Payer: Medicare HMO

## 2019-04-29 ENCOUNTER — Other Ambulatory Visit: Payer: Self-pay | Admitting: Internal Medicine

## 2019-04-29 ENCOUNTER — Ambulatory Visit: Payer: Medicare HMO | Admitting: Internal Medicine

## 2019-04-29 NOTE — Telephone Encounter (Signed)
Patient is requesting a refill on the following medication.  meloxicam (MOBIC) 7.5 MG tablet  tizanidine (ZANAFLEX) 2 MG capsule [place] - [address] Phone:  [phone]  Fax:  [phone]

## 2019-04-30 MED ORDER — MELOXICAM 7.5 MG PO TABS: 7.5000 mg | ORAL_TABLET | Freq: Every day | ORAL | 2 refills | Status: DC

## 2019-04-30 MED ORDER — TIZANIDINE HCL 2 MG PO CAPS: 2.0000 mg | ORAL_CAPSULE | Freq: Three times a day (TID) | ORAL | 2 refills | Status: DC

## 2019-05-03 ENCOUNTER — Other Ambulatory Visit: Payer: Self-pay | Admitting: Internal Medicine

## 2019-05-03 NOTE — Telephone Encounter (Signed)
Please refill as per office routine med refill policy (all routine meds refilled for 3 mo or monthly per pt preference up to one year from last visit, then month to month grace period for 3 mo, then further med refills will have to be denied)  

## 2019-05-06 ENCOUNTER — Ambulatory Visit (INDEPENDENT_AMBULATORY_CARE_PROVIDER_SITE_OTHER): Payer: Medicare HMO | Admitting: Internal Medicine

## 2019-05-06 ENCOUNTER — Encounter: Payer: Self-pay | Admitting: Internal Medicine

## 2019-05-06 DIAGNOSIS — J9611 Chronic respiratory failure with hypoxia: Secondary | ICD-10-CM

## 2019-05-06 DIAGNOSIS — Z0289 Encounter for other administrative examinations: Secondary | ICD-10-CM

## 2019-05-06 NOTE — Progress Notes (Signed)
Patient ID: Dawn Gomez, female   DOB: 1936/04/03, 84 y.o.   MRN: [id number]  Phone visit  Cumulative time during 7-day interval 12 min, there was not an associated office visit for this concern within a 7 day period.  Verbal consent for services obtained from patient prior to services given.  Names of all persons present for services: Cathlean Cower, MD, patient  Chief complaint: general medical and pain  History, background, results pertinent:  Here to f/u; overall doing ok,  Pt denies chest pain, increasing sob or doe, wheezing, orthopnea, PND, increased LE swelling, palpitations, dizziness or syncope.  Pt denies new neurological symptoms such as new headache, or facial or extremity weakness or numbness.  Pt denies polydipsia, polyuria, or low sugar episode.  Pt states overall good compliance with meds, mostly trying to follow appropriate diet, with wt overall stable, and the pain overall much better.   Past Medical History:  Diagnosis Date  . Abnormal heart rhythm   . Aortic aneurysm ([place])   . Aortic atherosclerosis ([place]) 03/03/2019  . Dyspnea   . Hypothyroid   . OSA (obstructive sleep apnea)    on cpap  . Psoriasis 02/05/2017  . Pulmonary fibrosis ([place])   . SVT (supraventricular tachycardia) ([place])   . Thyroid disease    hypo   No results found for this or any previous visit (from the past 48 hour(s)). Current Outpatient Medications on File Prior to Visit  Medication Sig Dispense Refill  . acetaminophen (TYLENOL) 500 MG tablet Take 500 mg by mouth every 6 (six) hours as needed.    . Ascorbic Acid (VITAMIN C PO) Take by mouth.    Marland Kitchen aspirin EC 81 MG tablet Take 81 mg by mouth daily.    . blood glucose meter kit and supplies Dispense based on patient and insurance preference. Use up to four times daily as directed. (FOR ICD-10 E11.9). 1 each 0  . CALCIUM-MAGNESIUM-ZINC PO Take 1 tablet by mouth daily.    . cholecalciferol (VITAMIN D) 1000 units tablet Take 5,000 Units by mouth  daily.    Marland Kitchen diltiazem (CARDIZEM CD) 180 MG 24 hr capsule Take 1 capsule (180 mg total) by mouth daily. 90 capsule 2  . escitalopram (LEXAPRO) 20 MG tablet TAKE 1 TABLET (20 MG TOTAL) BY MOUTH AT BEDTIME. 90 tablet 2  . fenofibrate 160 MG tablet TAKE 1 TABLET EVERY DAY 90 tablet 1  . fluconazole (DIFLUCAN) 150 MG tablet 1 tab by mouth every 3 days as needed 2 tablet 1  . fluocinonide (LIDEX) 0.05 % external solution APP EXT AA OF SCALP NIGHTLY  3  . furosemide (LASIX) 80 MG tablet 1 tab by mouth every am, and 1/2 tab in the PM as needed for persistent swelling only 135 tablet 3  . ipratropium-albuterol (DUONEB) 0.5-2.5 (3) MG/3ML SOLN Take 3 mLs by nebulization every 4 (four) hours as needed. 360 mL 0  . levothyroxine (SYNTHROID) 100 MCG tablet TAKE 1 TABLET DAILY BEFORE BREAKFAST 90 tablet 0  . lisinopril (ZESTRIL) 5 MG tablet Take 1 tablet (5 mg total) by mouth daily. 90 tablet 3  . meloxicam (MOBIC) 7.5 MG tablet Take 1 tablet (7.5 mg total) by mouth daily. 30 tablet 2  . potassium chloride SA (KLOR-CON) 20 MEQ tablet TAKE 1 TABLET (20 MEQ) BY MOUTH DAILY WITH FOOD 90 tablet 3  . rosuvastatin (CRESTOR) 20 MG tablet Take 1 tablet (20 mg total) by mouth daily. 90 tablet 3  . tizanidine (ZANAFLEX) 2 MG  capsule Take 1 capsule (2 mg total) by mouth 3 (three) times daily. 40 capsule 2  . traMADol (ULTRAM) 50 MG tablet Take 1 tablet (50 mg total) by mouth 2 (two) times daily. 180 tablet 1  . traZODone (DESYREL) 50 MG tablet Take 1 tablet (50 mg total) by mouth at bedtime as needed for sleep. 90 tablet 1  . triamcinolone (NASACORT) 55 MCG/ACT AERO nasal inhaler Place 2 sprays into the nose daily. 3 Inhaler 3  . vitamin B-12 (CYANOCOBALAMIN) 1000 MCG tablet Take 500 mcg by mouth daily.    . vitamin C (ASCORBIC ACID) 500 MG tablet Take 500 mg by mouth daily.    . Vitamin D, Ergocalciferol, (DRISDOL) 50000 units CAPS capsule Take 1 capsule (50,000 Units total) by mouth every 7 (seven) days. 12 capsule 0   . vitamin E 400 UNIT capsule Take 200 Units by mouth daily.     No current facility-administered medications on file prior to visit.   Lab Results  Component Value Date   WBC 6.2 10/06/2018   HGB 12.6 10/06/2018   HCT 37.5 10/06/2018   PLT 233.0 10/06/2018   GLUCOSE 81 10/06/2018   CHOL 121 10/06/2018   TRIG 152.0 (H) 10/06/2018   HDL 50.20 10/06/2018   LDLDIRECT 138.0 02/05/2017   LDLCALC 40 10/06/2018   ALT 16 10/06/2018   AST 20 10/06/2018   NA 137 10/06/2018   K 4.0 10/06/2018   CL 99 10/06/2018   CREATININE 1.01 10/06/2018   BUN 16 10/06/2018   CO2 26 10/06/2018   TSH 0.71 10/06/2018   HGBA1C 5.6 10/06/2018   A/P/next steps:   1)  Chronic pain - stable  2)  COPD - stable  Cathlean Cower MD

## 2019-05-06 NOTE — Patient Instructions (Signed)
You are signed up on the [facility] Covid vaccine wait list  Please continue all other medications as before, and refills have been done if requested.  Please have the pharmacy call with any other refills you may need.  Please continue your efforts at being more active, low cholesterol diet, and weight control.  Please keep your appointments with your specialists as you may have planned

## 2019-05-15 DIAGNOSIS — G4733 Obstructive sleep apnea (adult) (pediatric): Secondary | ICD-10-CM | POA: Diagnosis not present

## 2019-05-15 DIAGNOSIS — J8410 Pulmonary fibrosis, unspecified: Secondary | ICD-10-CM | POA: Diagnosis not present

## 2019-05-15 DIAGNOSIS — J9621 Acute and chronic respiratory failure with hypoxia: Secondary | ICD-10-CM | POA: Diagnosis not present

## 2019-05-15 DIAGNOSIS — J449 Chronic obstructive pulmonary disease, unspecified: Secondary | ICD-10-CM | POA: Diagnosis not present

## 2019-06-02 ENCOUNTER — Other Ambulatory Visit: Payer: Self-pay

## 2019-06-02 ENCOUNTER — Ambulatory Visit
Admission: RE | Admit: 2019-06-02 | Discharge: 2019-06-02 | Disposition: A | Discharge status: Home / Self Care | Payer: Medicare HMO | Source: Ambulatory Visit | Attending: Internal Medicine | Admitting: Internal Medicine

## 2019-06-02 DIAGNOSIS — Z1231 Encounter for screening mammogram for malignant neoplasm of breast: Secondary | ICD-10-CM

## 2019-06-10 ENCOUNTER — Other Ambulatory Visit: Payer: Self-pay | Admitting: Internal Medicine

## 2019-06-12 DIAGNOSIS — G4733 Obstructive sleep apnea (adult) (pediatric): Secondary | ICD-10-CM | POA: Diagnosis not present

## 2019-06-12 DIAGNOSIS — J449 Chronic obstructive pulmonary disease, unspecified: Secondary | ICD-10-CM | POA: Diagnosis not present

## 2019-06-12 DIAGNOSIS — J8410 Pulmonary fibrosis, unspecified: Secondary | ICD-10-CM | POA: Diagnosis not present

## 2019-06-12 DIAGNOSIS — J9621 Acute and chronic respiratory failure with hypoxia: Secondary | ICD-10-CM | POA: Diagnosis not present

## 2019-06-15 ENCOUNTER — Ambulatory Visit: Payer: Self-pay | Admitting: Podiatry

## 2019-06-21 ENCOUNTER — Telehealth: Payer: Self-pay

## 2019-06-21 MED ORDER — MELOXICAM 7.5 MG PO TABS: 7.5000 mg | ORAL_TABLET | Freq: Every day | ORAL | 5 refills | Status: DC

## 2019-06-21 MED ORDER — MELOXICAM 7.5 MG PO TABS: 7.5000 mg | ORAL_TABLET | Freq: Every day | ORAL | 2 refills | Status: DC

## 2019-06-21 NOTE — Telephone Encounter (Signed)
none

## 2019-06-22 ENCOUNTER — Other Ambulatory Visit: Payer: Self-pay | Admitting: Internal Medicine

## 2019-06-28 ENCOUNTER — Telehealth: Payer: Self-pay

## 2019-06-28 MED ORDER — TIZANIDINE HCL 2 MG PO CAPS: 2.0000 mg | ORAL_CAPSULE | Freq: Three times a day (TID) | ORAL | 2 refills | Status: DC

## 2019-06-28 NOTE — Telephone Encounter (Signed)
90 days supply   1.Medication Requested:tizanidine (ZANAFLEX) 2 MG capsule  2. Pharmacy (Name, [place], [place], [place]  3. On Med List: yes   4. Last Visit with PCP: 1.21.21   5. Next visit date with PCP:no appt made at this time   Agent: Please be advised that RX refills may take up to 3 business days. We ask that you follow-up with your pharmacy.

## 2019-07-07 ENCOUNTER — Other Ambulatory Visit: Payer: Self-pay | Admitting: Internal Medicine

## 2019-07-07 NOTE — Telephone Encounter (Signed)
Please refill as per office routine med refill policy (all routine meds refilled for 3 mo or monthly per pt preference up to one year from last visit, then month to month grace period for 3 mo, then further med refills will have to be denied)  

## 2019-07-13 DIAGNOSIS — J449 Chronic obstructive pulmonary disease, unspecified: Secondary | ICD-10-CM | POA: Diagnosis not present

## 2019-07-13 DIAGNOSIS — G4733 Obstructive sleep apnea (adult) (pediatric): Secondary | ICD-10-CM | POA: Diagnosis not present

## 2019-07-13 DIAGNOSIS — J9621 Acute and chronic respiratory failure with hypoxia: Secondary | ICD-10-CM | POA: Diagnosis not present

## 2019-07-13 DIAGNOSIS — J8410 Pulmonary fibrosis, unspecified: Secondary | ICD-10-CM | POA: Diagnosis not present

## 2019-07-30 ENCOUNTER — Other Ambulatory Visit: Payer: Self-pay | Admitting: Internal Medicine

## 2019-08-12 DIAGNOSIS — J8410 Pulmonary fibrosis, unspecified: Secondary | ICD-10-CM | POA: Diagnosis not present

## 2019-08-12 DIAGNOSIS — G4733 Obstructive sleep apnea (adult) (pediatric): Secondary | ICD-10-CM | POA: Diagnosis not present

## 2019-08-12 DIAGNOSIS — J449 Chronic obstructive pulmonary disease, unspecified: Secondary | ICD-10-CM | POA: Diagnosis not present

## 2019-08-12 DIAGNOSIS — J9621 Acute and chronic respiratory failure with hypoxia: Secondary | ICD-10-CM | POA: Diagnosis not present

## 2019-08-14 ENCOUNTER — Other Ambulatory Visit: Payer: Self-pay | Admitting: Internal Medicine

## 2019-08-14 NOTE — Telephone Encounter (Signed)
Done erx 

## 2019-09-12 DIAGNOSIS — J9621 Acute and chronic respiratory failure with hypoxia: Secondary | ICD-10-CM | POA: Diagnosis not present

## 2019-09-12 DIAGNOSIS — J8410 Pulmonary fibrosis, unspecified: Secondary | ICD-10-CM | POA: Diagnosis not present

## 2019-09-12 DIAGNOSIS — G4733 Obstructive sleep apnea (adult) (pediatric): Secondary | ICD-10-CM | POA: Diagnosis not present

## 2019-09-12 DIAGNOSIS — J449 Chronic obstructive pulmonary disease, unspecified: Secondary | ICD-10-CM | POA: Diagnosis not present

## 2019-10-12 DIAGNOSIS — G4733 Obstructive sleep apnea (adult) (pediatric): Secondary | ICD-10-CM | POA: Diagnosis not present

## 2019-10-12 DIAGNOSIS — J8410 Pulmonary fibrosis, unspecified: Secondary | ICD-10-CM | POA: Diagnosis not present

## 2019-10-12 DIAGNOSIS — J9621 Acute and chronic respiratory failure with hypoxia: Secondary | ICD-10-CM | POA: Diagnosis not present

## 2019-10-12 DIAGNOSIS — J449 Chronic obstructive pulmonary disease, unspecified: Secondary | ICD-10-CM | POA: Diagnosis not present

## 2019-10-13 ENCOUNTER — Other Ambulatory Visit: Payer: Self-pay | Admitting: Internal Medicine

## 2019-10-14 ENCOUNTER — Other Ambulatory Visit: Payer: Self-pay

## 2019-10-14 MED ORDER — TRAMADOL HCL 50 MG PO TABS: 50.0000 mg | ORAL_TABLET | Freq: Two times a day (BID) | ORAL | 1 refills | Status: DC

## 2019-10-14 NOTE — Telephone Encounter (Signed)
Done erx 

## 2019-10-18 ENCOUNTER — Other Ambulatory Visit: Payer: Self-pay | Admitting: Internal Medicine

## 2019-10-25 ENCOUNTER — Telehealth: Payer: Self-pay

## 2019-10-25 MED ORDER — TIZANIDINE HCL 2 MG PO TABS: 2.0000 mg | ORAL_TABLET | Freq: Three times a day (TID) | ORAL | 1 refills | Status: DC

## 2019-10-25 NOTE — Telephone Encounter (Signed)
New message    Need clarification on medication tizanidine (ZANAFLEX) 2 MG capsule the coverage only covers 2 mg tablets, not the capsule.    Please advise.

## 2019-11-09 ENCOUNTER — Ambulatory Visit: Payer: Medicare HMO | Admitting: Internal Medicine

## 2019-11-12 DIAGNOSIS — J449 Chronic obstructive pulmonary disease, unspecified: Secondary | ICD-10-CM | POA: Diagnosis not present

## 2019-11-12 DIAGNOSIS — J8410 Pulmonary fibrosis, unspecified: Secondary | ICD-10-CM | POA: Diagnosis not present

## 2019-11-12 DIAGNOSIS — G4733 Obstructive sleep apnea (adult) (pediatric): Secondary | ICD-10-CM | POA: Diagnosis not present

## 2019-11-12 DIAGNOSIS — J9621 Acute and chronic respiratory failure with hypoxia: Secondary | ICD-10-CM | POA: Diagnosis not present

## 2019-11-18 ENCOUNTER — Ambulatory Visit: Payer: Medicare HMO | Admitting: Internal Medicine

## 2019-11-18 DIAGNOSIS — Z0289 Encounter for other administrative examinations: Secondary | ICD-10-CM

## 2019-12-02 ENCOUNTER — Other Ambulatory Visit: Payer: Self-pay | Admitting: Internal Medicine

## 2019-12-02 NOTE — Telephone Encounter (Signed)
Please refill as per office routine med refill policy (all routine meds refilled for 3 mo or monthly per pt preference up to one year from last visit, then month to month grace period for 3 mo, then further med refills will have to be denied)  

## 2019-12-10 ENCOUNTER — Ambulatory Visit: Payer: Medicare HMO | Admitting: Internal Medicine

## 2019-12-13 DIAGNOSIS — J449 Chronic obstructive pulmonary disease, unspecified: Secondary | ICD-10-CM | POA: Diagnosis not present

## 2019-12-13 DIAGNOSIS — J9621 Acute and chronic respiratory failure with hypoxia: Secondary | ICD-10-CM | POA: Diagnosis not present

## 2019-12-13 DIAGNOSIS — G4733 Obstructive sleep apnea (adult) (pediatric): Secondary | ICD-10-CM | POA: Diagnosis not present

## 2019-12-13 DIAGNOSIS — J8410 Pulmonary fibrosis, unspecified: Secondary | ICD-10-CM | POA: Diagnosis not present

## 2019-12-17 ENCOUNTER — Ambulatory Visit (INDEPENDENT_AMBULATORY_CARE_PROVIDER_SITE_OTHER): Payer: Medicare HMO | Admitting: Internal Medicine

## 2019-12-17 ENCOUNTER — Encounter: Payer: Self-pay | Admitting: Internal Medicine

## 2019-12-17 ENCOUNTER — Other Ambulatory Visit: Payer: Self-pay

## 2019-12-17 VITALS — BP 100/50 | HR 65 | Temp 97.9°F | Ht 59.0 in | Wt 161.1 lb

## 2019-12-17 DIAGNOSIS — Z0000 Encounter for general adult medical examination without abnormal findings: Secondary | ICD-10-CM

## 2019-12-17 DIAGNOSIS — M5416 Radiculopathy, lumbar region: Secondary | ICD-10-CM | POA: Diagnosis not present

## 2019-12-17 DIAGNOSIS — Z23 Encounter for immunization: Secondary | ICD-10-CM

## 2019-12-17 DIAGNOSIS — Z0001 Encounter for general adult medical examination with abnormal findings: Secondary | ICD-10-CM

## 2019-12-17 DIAGNOSIS — E538 Deficiency of other specified B group vitamins: Secondary | ICD-10-CM | POA: Diagnosis not present

## 2019-12-17 DIAGNOSIS — R739 Hyperglycemia, unspecified: Secondary | ICD-10-CM

## 2019-12-17 DIAGNOSIS — E559 Vitamin D deficiency, unspecified: Secondary | ICD-10-CM

## 2019-12-17 MED ORDER — GABAPENTIN 100 MG PO CAPS: 100.0000 mg | ORAL_CAPSULE | Freq: Three times a day (TID) | ORAL | 1 refills | Status: DC

## 2019-12-17 MED ORDER — TRAMADOL HCL 50 MG PO TABS
50.0000 mg | ORAL_TABLET | Freq: Four times a day (QID) | ORAL | 1 refills | Status: DC
Start: 2019-12-17 — End: 2020-04-13

## 2019-12-17 NOTE — Patient Instructions (Signed)
You had the flu shot today  Your SCAT form will be finished and faxed  Please take all new medication as prescribed - the gabapentin at 100 mg three times per day;  Please call in 1-2 weeks if you are not sleepy on this for a higher dose if needed for pain  Ok to increase the Tramadol to 4 times per day  Please continue all other medications as before, and refills have been done if requested.  Please have the pharmacy call with any other refills you may need.  Please continue your efforts at being more active, low cholesterol diet, and weight control.  You are otherwise up to date with prevention measures today.  Please keep your appointments with your specialists as you may have planned  You will be contacted regarding the referral for: Sports medicine (on the first floor)  Please go to the LAB at the blood drawing area for the tests to be done  You will be contacted by phone if any changes need to be made immediately.  Otherwise, you will receive a letter about your results with an explanation, but please check with MyChart first.  Please remember to sign up for MyChart if you have not done so, as this will be important to you in the future with finding out test results, communicating by private email, and scheduling acute appointments online when needed.  Please make an Appointment to return in 6 months, or sooner if needed

## 2019-12-17 NOTE — Progress Notes (Signed)
Subjective:    Patient ID: Dawn Gomez, female    DOB: 1935-06-18, 84 y.o.   MRN: [id number]  HPI  Here for wellness and f/u;  Overall doing ok;  Pt denies Chest pain, worsening SOB, DOE, wheezing, orthopnea, PND, worsening LE edema, palpitations, dizziness or syncope.  Pt denies neurological change such as new headache, facial or extremity weakness.  Pt denies polydipsia, polyuria, or low sugar symptoms. Pt states overall good compliance with treatment and medications, good tolerability, and has been trying to follow appropriate diet.  Pt denies worsening depressive symptoms, suicidal ideation or panic. No fever, night sweats, wt loss, loss of appetite, or other constitutional symptoms.  Pt states good ability with ADL's, has low fall risk, home safety reviewed and adequate, no other significant changes in hearing or vision, and only occasionally active with exercise.  conts on home o2.  Also has new 4 wks worsening now mod to severe right lumbar radicular pain from right lower back to the distal leg, assoc with numbness and weakness.   Past Medical History:  Diagnosis Date  . Abnormal heart rhythm   . Aortic aneurysm ([place])   . Aortic atherosclerosis ([place]) 03/03/2019  . Dyspnea   . Hypothyroid   . OSA (obstructive sleep apnea)    on cpap  . Psoriasis 02/05/2017  . Pulmonary fibrosis ([place])   . SVT (supraventricular tachycardia) ([place])   . Thyroid disease    hypo   Past Surgical History:  Procedure Laterality Date  . ABDOMINAL HYSTERECTOMY    . APPENDECTOMY    . CHOLECYSTECTOMY    . CRANIOTOMY     for aneurysms  . CRANIOTOMY  1980 x 2   aneurysmal clipping  . HERNIA REPAIR    . TOTAL ABDOMINAL HYSTERECTOMY    . TUBAL LIGATION      reports that she quit smoking about 13 years ago. Her smoking use included cigarettes. She has a 100.00 pack-year smoking history. She has never used smokeless tobacco. She reports current alcohol use. She reports that she does not use  drugs. family history includes Allergies in her sister; Bone cancer in her father; Breast cancer in her sister; Cancer in her sister; Clotting disorder in her father and sister; Emphysema in her father, mother, and sister; Heart disease in her father and mother; Lung cancer in her father and mother; Prostate cancer in her father; Rheum arthritis in her father, mother, and sister. Allergies  Allergen Reactions  . Lipitor [Atorvastatin] Shortness Of Breath, Diarrhea and Other (See Comments)    Dizzy, pain all over.  . Codeine   . Hydromorphone Hcl     REACTION: dementia  . Levaquin [Levofloxacin In D5w]     Shock per pt but can take cipro  . Morphine   . Oxycodone-Acetaminophen   . Propoxyphene N-Acetaminophen   . Simvastatin    Current Outpatient Medications on File Prior to Visit  Medication Sig Dispense Refill  . acetaminophen (TYLENOL) 500 MG tablet Take 500 mg by mouth every 6 (six) hours as needed.    . Ascorbic Acid (VITAMIN C PO) Take by mouth.    Marland Kitchen aspirin EC 81 MG tablet Take 81 mg by mouth daily.    . blood glucose meter kit and supplies Dispense based on patient and insurance preference. Use up to four times daily as directed. (FOR ICD-10 E11.9). 1 each 0  . CALCIUM-MAGNESIUM-ZINC PO Take 1 tablet by mouth daily.    . cholecalciferol (VITAMIN D) 1000 units  tablet Take 5,000 Units by mouth daily.    Marland Kitchen diltiazem (CARDIZEM CD) 180 MG 24 hr capsule TAKE 1 CAPSULE (180 MG TOTAL) BY MOUTH DAILY. 90 capsule 2  . escitalopram (LEXAPRO) 20 MG tablet TAKE 1 TABLET (20 MG TOTAL) BY MOUTH AT BEDTIME. 90 tablet 3  . fenofibrate 160 MG tablet TAKE 1 TABLET EVERY DAY 90 tablet 1  . fluconazole (DIFLUCAN) 150 MG tablet 1 tab by mouth every 3 days as needed 2 tablet 1  . fluocinonide (LIDEX) 0.05 % external solution APP EXT AA OF SCALP NIGHTLY  3  . furosemide (LASIX) 80 MG tablet TAKE 1 TABLET EVERY DAY 90 tablet 1  . ipratropium-albuterol (DUONEB) 0.5-2.5 (3) MG/3ML SOLN Take 3 mLs by  nebulization every 4 (four) hours as needed. 360 mL 0  . levothyroxine (SYNTHROID) 100 MCG tablet Take 1 tablet (100 mcg total) by mouth daily before breakfast. 90 tablet 1  . lisinopril (ZESTRIL) 5 MG tablet TAKE 1 TABLET EVERY DAY 90 tablet 1  . meloxicam (MOBIC) 7.5 MG tablet Take 1 tablet (7.5 mg total) by mouth daily. 90 tablet 2  . potassium chloride SA (KLOR-CON) 20 MEQ tablet TAKE 1 TABLET (20 MEQ) BY MOUTH DAILY WITH FOOD 90 tablet 3  . rosuvastatin (CRESTOR) 20 MG tablet TAKE 1 TABLET (20 MG TOTAL) BY MOUTH DAILY. 90 tablet 3  . tiZANidine (ZANAFLEX) 2 MG tablet Take 1 tablet (2 mg total) by mouth 3 (three) times daily. 270 tablet 1  . traZODone (DESYREL) 50 MG tablet TAKE 1 TABLET (50 MG TOTAL) BY MOUTH AT BEDTIME AS NEEDED FOR SLEEP. 90 tablet 1  . triamcinolone (NASACORT) 55 MCG/ACT AERO nasal inhaler Place 2 sprays into the nose daily. 3 Inhaler 3  . vitamin B-12 (CYANOCOBALAMIN) 1000 MCG tablet Take 500 mcg by mouth daily.    . vitamin C (ASCORBIC ACID) 500 MG tablet Take 500 mg by mouth daily.    . Vitamin D, Ergocalciferol, (DRISDOL) 50000 units CAPS capsule Take 1 capsule (50,000 Units total) by mouth every 7 (seven) days. 12 capsule 0  . vitamin E 400 UNIT capsule Take 200 Units by mouth daily.     No current facility-administered medications on file prior to visit.   Review of Systems All otherwise neg per pt    Objective:   Physical Exam BP (!) 100/50 (BP Location: Left Arm, Patient Position: Sitting, Cuff Size: Large)   Pulse 65   Temp 97.9 F (36.6 C) (Oral)   Ht _0  (1.499 m)   Wt 161 lb 2 oz (73.1 kg)   SpO2 96%   BMI 32.54 kg/m  VS noted,  Constitutional: Pt appears in NAD HENT: Head: NCAT.  Right Ear: External ear normal.  Left Ear: External ear normal.  Eyes: . Pupils are equal, round, and reactive to light. Conjunctivae and EOM are normal Nose: without d/c or deformity Neck: Neck supple. Gross normal ROM Cardiovascular: Normal rate and regular  rhythm.   Pulmonary/Chest: Effort normal and breath sounds without rales or wheezing.  Abd:  Soft, NT, ND, + BS, no organomegaly Neurological: Pt is alert. At baseline orientation, motor grossly intact ex ept rlre 4/5 motor Skin: Skin is warm. No rashes, other new lesions, no LE edema Psychiatric: Pt behavior is normal without agitation  All otherwise neg per pt Lab Results  Component Value Date   WBC 6.2 10/06/2018   HGB 12.6 10/06/2018   HCT 37.5 10/06/2018   PLT 233.0 10/06/2018   GLUCOSE  108 (H) 12/17/2019   CHOL 121 10/06/2018   TRIG 152.0 (H) 10/06/2018   HDL 50.20 10/06/2018   LDLDIRECT 138.0 02/05/2017   LDLCALC 40 10/06/2018   ALT 13 12/17/2019   AST 18 12/17/2019   NA 130 (L) 12/17/2019   K 4.6 12/17/2019   CL 88 (L) 12/17/2019   CREATININE 1.50 (H) 12/17/2019   BUN 39 (H) 12/17/2019   CO2 30 12/17/2019   TSH 0.71 10/06/2018   HGBA1C 5.6 10/06/2018       Assessment & Plan:

## 2019-12-18 ENCOUNTER — Encounter: Payer: Self-pay | Admitting: Internal Medicine

## 2019-12-18 LAB — COMPLETE METABOLIC PANEL WITH GFR
AG Ratio: 1.8 (calc) (ref 1.0–2.5)
ALT: 13 U/L (ref 6–29)
AST: 18 U/L (ref 10–35)
Albumin: 4.4 g/dL (ref 3.6–5.1)
Alkaline phosphatase (APISO): 49 U/L (ref 37–153)
BUN/Creatinine Ratio: 26 (calc) — ABNORMAL HIGH (ref 6–22)
BUN: 39 mg/dL — ABNORMAL HIGH (ref 7–25)
CO2: 30 mmol/L (ref 20–32)
Calcium: 9.7 mg/dL (ref 8.6–10.4)
Chloride: 88 mmol/L — ABNORMAL LOW (ref 98–110)
Creat: 1.5 mg/dL — ABNORMAL HIGH (ref 0.60–0.88)
GFR, Est African American: 37 mL/min/{1.73_m2} — ABNORMAL LOW (ref >=60)
GFR, Est Non African American: 32 mL/min/{1.73_m2} — ABNORMAL LOW (ref >=60)
Globulin: 2.5 g/dL (calc) (ref 1.9–3.7)
Glucose, Bld: 108 mg/dL — ABNORMAL HIGH (ref 65–99)
Potassium: 4.6 mmol/L (ref 3.5–5.3)
Sodium: 130 mmol/L — ABNORMAL LOW (ref 135–146)
Total Bilirubin: 0.6 mg/dL (ref 0.2–1.2)
Total Protein: 6.9 g/dL (ref 6.1–8.1)

## 2019-12-18 NOTE — Assessment & Plan Note (Signed)

## 2019-12-18 NOTE — Assessment & Plan Note (Signed)
Cont oral replacement 

## 2019-12-18 NOTE — Assessment & Plan Note (Signed)
New onset, for gabapentin 100 tid, increased tramadol qid prn, not eligible for mri due plate in head, for sport med referral  I spent 31 minutes in addition to time for CPX wellness examination in preparing to see the patient by review of recent labs, imaging and procedures, obtaining and reviewing separately obtained history, communicating with the patient and family or caregiver, ordering medications, tests or procedures, and documenting clinical information in the EHR including the differential Dx, treatment, and any further evaluation and other management of right lumbar radiculopathy, vit d deficiency, hyperglycema

## 2019-12-18 NOTE — Assessment & Plan Note (Signed)
stable overall by history and exam, recent data reviewed with pt, and pt to continue medical treatment as before,  to f/u any worsening symptoms or concerns  

## 2019-12-19 LAB — CBC WITH DIFFERENTIAL/PLATELET
Absolute Monocytes: 781 cells/uL (ref 200–950)
Basophils Absolute: 92 cells/uL (ref 0–200)
Basophils Relative: 1.3 %
Eosinophils Absolute: 241 cells/uL (ref 15–500)
Eosinophils Relative: 3.4 %
HCT: 33.2 % — ABNORMAL LOW (ref 35.0–45.0)
Hemoglobin: 10.9 g/dL — ABNORMAL LOW (ref 11.7–15.5)
Lymphs Abs: 1768 cells/uL (ref 850–3900)
MCH: 30.7 pg (ref 27.0–33.0)
MCHC: 32.8 g/dL (ref 32.0–36.0)
MCV: 93.5 fL (ref 80.0–100.0)
MPV: 9.9 fL (ref 7.5–12.5)
Monocytes Relative: 11 %
Neutro Abs: 4217 cells/uL (ref 1500–7800)
Neutrophils Relative %: 59.4 %
Platelets: 362 10*3/uL (ref 140–400)
RBC: 3.55 10*6/uL — ABNORMAL LOW (ref 3.80–5.10)
RDW: 12.6 % (ref 11.0–15.0)
Total Lymphocyte: 24.9 %
WBC: 7.1 10*3/uL (ref 3.8–10.8)

## 2019-12-19 LAB — TSH: TSH: 1.04 mIU/L (ref 0.40–4.50)

## 2019-12-19 LAB — VITAMIN B12: Vitamin B-12: 546 pg/mL (ref 200–1100)

## 2019-12-19 LAB — HEMOGLOBIN A1C
Hgb A1c MFr Bld: 5.2 % of total Hgb (ref <5.7)
Mean Plasma Glucose: 103 (calc)
eAG (mmol/L): 5.7 (calc)

## 2019-12-19 LAB — URINALYSIS, ROUTINE W REFLEX MICROSCOPIC

## 2019-12-19 LAB — LIPID PANEL
Cholesterol: 123 mg/dL (ref <200)
HDL: 51 mg/dL (ref >=50)
LDL Cholesterol (Calc): 53 mg/dL (calc)
Non-HDL Cholesterol (Calc): 72 mg/dL (calc) (ref <130)
Total CHOL/HDL Ratio: 2.4 (calc) (ref <5.0)
Triglycerides: 108 mg/dL (ref <150)

## 2019-12-19 LAB — VITAMIN D 25 HYDROXY (VIT D DEFICIENCY, FRACTURES): Vit D, 25-Hydroxy: 54 ng/mL (ref 30–100)

## 2019-12-20 ENCOUNTER — Encounter: Payer: Self-pay | Admitting: Internal Medicine

## 2019-12-20 ENCOUNTER — Telehealth: Payer: Self-pay | Admitting: Internal Medicine

## 2019-12-20 NOTE — Telephone Encounter (Signed)
    Patient requesting order for traMADol (ULTRAM) 50 MG tablet be sent to [facility] for future refills due to cost savings

## 2019-12-20 NOTE — Telephone Encounter (Signed)
Sent to Dr. Jenny Reichmann as an Dawn Gomez.

## 2019-12-21 ENCOUNTER — Telehealth: Payer: Self-pay | Admitting: Internal Medicine

## 2019-12-21 NOTE — Telephone Encounter (Signed)
I received [employer] SCAT forms for patient.   Forms have been completed &Placed in providers box to review and sign.

## 2019-12-23 ENCOUNTER — Ambulatory Visit (INDEPENDENT_AMBULATORY_CARE_PROVIDER_SITE_OTHER): Payer: Medicare HMO

## 2019-12-23 ENCOUNTER — Encounter: Payer: Self-pay | Admitting: Family Medicine

## 2019-12-23 ENCOUNTER — Other Ambulatory Visit: Payer: Self-pay

## 2019-12-23 ENCOUNTER — Ambulatory Visit: Payer: Medicare HMO | Admitting: Family Medicine

## 2019-12-23 VITALS — BP 98/54 | HR 68 | Ht 59.0 in | Wt 163.2 lb

## 2019-12-23 DIAGNOSIS — M5441 Lumbago with sciatica, right side: Secondary | ICD-10-CM

## 2019-12-23 DIAGNOSIS — M47816 Spondylosis without myelopathy or radiculopathy, lumbar region: Secondary | ICD-10-CM | POA: Diagnosis not present

## 2019-12-23 DIAGNOSIS — M5416 Radiculopathy, lumbar region: Secondary | ICD-10-CM | POA: Diagnosis not present

## 2019-12-23 DIAGNOSIS — G8929 Other chronic pain: Secondary | ICD-10-CM

## 2019-12-23 NOTE — Progress Notes (Signed)
    Subjective:    CC: Low back pain and R LE pain  HPI: Pt is an 84 y/o female presenting w/ c/o worsening low back pain and radiating pain into the R LE x approximately one month.  She was prescribed Gabapentin but is not taking it.  Radiating pain: yes into R LE to the lower leg and foot R LE numbness/tingling: yes, intermittently R LE weakness: sometimes Aggravating factors: walking and standing Treatments tried: Tramadol,   Diagnostic testing: L-spine and T-spine XR- 02/05/17  Pertinent review of Systems: No fevers or chills  Relevant historical information: MRI incompatible.  Interstitial lung disease with emphysema..   Objective:    Vitals:   12/23/19 1235  BP: (!) 98/54  Pulse: 68  SpO2: 99%   General: Well Developed, well nourished, and in no acute distress.   MSK: L-spine normal-appearing nontender midline.  Tender palpation paraspinal musculature. Decreased lumbar motion. Lower extremity strength diminished bilateral lower extremities. Antalgic gait.  Lab and Radiology Results  X-ray images L-spine obtained today personally and independently reviewed DDD and facet DJD worse L5-S1.  Aortic atherosclerosis present. Await formal radiology review  Impression and Recommendations:    Assessment and Plan: 84 y.o. female with right-sided low back pain somewhat chronically associated with right lumbar radiculopathy possibly L5 dermatomal pattern.  Discussed options.  Patient unfortunately is not MRI compatible therefore further work-up if needed will need CT myelogram.  Plan for trial of physical therapy.  Already on reasonable medications for this.  If not better proceed with CT myelogram lumbar spine.Marland Kitchen  PDMP not reviewed this encounter. Orders Placed This Encounter  Procedures  . DG Lumbar Spine 2-3 Views    Standing Status:   Future    Number of Occurrences:   1    Standing Expiration Date:   12/22/2020    Order Specific Question:   Reason for Exam  (SYMPTOM  OR DIAGNOSIS REQUIRED)    Answer:   eval lumbar pain and rt lumbar rad    Order Specific Question:   Preferred imaging location?    Answer:   [facility]    Order Specific Question:   Radiology Contrast Protocol - do NOT remove file path    Answer:   [email or web address]  . Ambulatory referral to Physical Therapy    Referral Priority:   Routine    Referral Type:   Physical Medicine    Referral Reason:   Specialty Services Required    Requested Specialty:   Physical Therapy   No orders of the defined types were placed in this encounter.   Discussed warning signs or symptoms. Please see discharge instructions. Patient expresses understanding.   The above documentation has been reviewed and is accurate and complete Lynne Leader, M.D.

## 2019-12-23 NOTE — Patient Instructions (Signed)
Thank you for coming in today. Plan for physical therapy.  Plan for xray today.  Recheck in 6 weeks.  If not better I will plan for CT myelogram scan of the back to look at pinched nerves.

## 2019-12-27 NOTE — Progress Notes (Signed)
X-ray lumbar spine shows mild arthritis and bone spurring present.

## 2019-12-27 NOTE — Telephone Encounter (Signed)
Forms have been completed &Signed, Emailed to [employer], Copy sent to scan.   LVM to inform patient original is ready to be picked. If she would like it mailed to please give me a call and let me know.

## 2020-01-12 DIAGNOSIS — J9621 Acute and chronic respiratory failure with hypoxia: Secondary | ICD-10-CM | POA: Diagnosis not present

## 2020-01-12 DIAGNOSIS — J449 Chronic obstructive pulmonary disease, unspecified: Secondary | ICD-10-CM | POA: Diagnosis not present

## 2020-01-12 DIAGNOSIS — G4733 Obstructive sleep apnea (adult) (pediatric): Secondary | ICD-10-CM | POA: Diagnosis not present

## 2020-01-12 DIAGNOSIS — J8410 Pulmonary fibrosis, unspecified: Secondary | ICD-10-CM | POA: Diagnosis not present

## 2020-02-01 ENCOUNTER — Ambulatory Visit: Payer: Medicare HMO | Attending: Family Medicine

## 2020-02-03 ENCOUNTER — Ambulatory Visit: Payer: Medicare HMO | Admitting: Family Medicine

## 2020-02-07 ENCOUNTER — Ambulatory Visit: Payer: Medicare HMO | Admitting: Physical Therapy

## 2020-02-08 ENCOUNTER — Encounter: Payer: Self-pay | Admitting: Internal Medicine

## 2020-02-08 ENCOUNTER — Telehealth (INDEPENDENT_AMBULATORY_CARE_PROVIDER_SITE_OTHER): Payer: Medicare HMO | Admitting: Internal Medicine

## 2020-02-08 ENCOUNTER — Telehealth: Payer: Medicare HMO | Admitting: Internal Medicine

## 2020-02-08 DIAGNOSIS — R739 Hyperglycemia, unspecified: Secondary | ICD-10-CM

## 2020-02-08 DIAGNOSIS — E559 Vitamin D deficiency, unspecified: Secondary | ICD-10-CM

## 2020-02-08 DIAGNOSIS — J329 Chronic sinusitis, unspecified: Secondary | ICD-10-CM | POA: Diagnosis not present

## 2020-02-08 DIAGNOSIS — J309 Allergic rhinitis, unspecified: Secondary | ICD-10-CM | POA: Diagnosis not present

## 2020-02-08 MED ORDER — FUROSEMIDE 80 MG PO TABS
80.0000 mg | ORAL_TABLET | Freq: Every day | ORAL | 0 refills | Status: DC
Start: 2020-02-08 — End: 2020-02-08

## 2020-02-08 MED ORDER — GUAIFENESIN ER 600 MG PO TB12: 1200.0000 mg | ORAL_TABLET | Freq: Two times a day (BID) | ORAL | 2 refills | Status: AC | PRN

## 2020-02-08 MED ORDER — FUROSEMIDE 80 MG PO TABS
80.0000 mg | ORAL_TABLET | Freq: Every day | ORAL | 3 refills | Status: DC
Start: 2020-02-08 — End: 2020-04-13

## 2020-02-08 MED ORDER — AMOXICILLIN-POT CLAVULANATE 875-125 MG PO TABS: 1.0000 | ORAL_TABLET | Freq: Two times a day (BID) | ORAL | 0 refills | Status: DC

## 2020-02-08 NOTE — Assessment & Plan Note (Signed)
Also for mucinex otc bid prn,  to f/u any worsening symptoms or concerns

## 2020-02-08 NOTE — Assessment & Plan Note (Signed)
Cont oral replacement 2000 u qd

## 2020-02-08 NOTE — Assessment & Plan Note (Signed)
stable overall by history and exam, recent data reviewed with pt, and pt to continue medical treatment as before,  to f/u any worsening symptoms or concerns  

## 2020-02-08 NOTE — Assessment & Plan Note (Addendum)
Mild to mod, for antibx course,  to f/u any worsening symptoms or concerns  I spent 31 minutes in preparing to see the patient by review of recent labs, imaging and procedures, obtaining and reviewing separately obtained history, communicating with the patient and family or caregiver, ordering medications, tests or procedures, and documenting clinical information in the EHR including the differential Dx, treatment, and any further evaluation and other management of sinusitis, allergies, hyperglycemia, vit d def

## 2020-02-08 NOTE — Progress Notes (Signed)
Patient ID: Dawn Gomez, female   DOB: January 19, 1936, 84 y.o.   MRN: [id number]  Virtual Visit via Video Note  I connected with Dawn Gomez on 02/08/20 at  9:00 AM EDT by a video enabled telemedicine application and verified that I am speaking with the correct person using two identifiers.  Location of all participants today Patient: at home Provider: at office   I discussed the limitations of evaluation and management by telemedicine and the availability of in person appointments. The patient expressed understanding and agreed to proceed.  History of Present Illness:  Here with 2-3 days acute onset fever, facial pain, pressure, headache, general weakness and malaise, and greenish d/c, with mild ST and cough, but pt denies chest pain, wheezing, increased sob or doe, orthopnea, PND, increased LE swelling, palpitations, dizziness or syncope. Does have several wks ongoing nasal allergy symptoms with clearish congestion, itch and sneezing, without fever, pain, ST, cough, swelling or wheezing    Pt denies polydipsia, polyuria, or low sugar symptoms such as weakness or confusion improved with po intake.  Pt states overall good compliance with meds, trying to follow lower cholesterol, diabetic diet, wt overall stable but little exercise however.    Tolerating Vit D Past Medical History:  Diagnosis Date  . Abnormal heart rhythm   . Aortic aneurysm ([place])   . Aortic atherosclerosis ([place]) 03/03/2019  . Dyspnea   . Hypothyroid   . OSA (obstructive sleep apnea)    on cpap  . Psoriasis 02/05/2017  . Pulmonary fibrosis ([place])   . SVT (supraventricular tachycardia) ([place])   . Thyroid disease    hypo   Past Surgical History:  Procedure Laterality Date  . ABDOMINAL HYSTERECTOMY    . APPENDECTOMY    . CHOLECYSTECTOMY    . CRANIOTOMY     for aneurysms  . CRANIOTOMY  1980 x 2   aneurysmal clipping  . HERNIA REPAIR    . TOTAL ABDOMINAL HYSTERECTOMY    . TUBAL LIGATION      reports that  she quit smoking about 13 years ago. Her smoking use included cigarettes. She has a 100.00 pack-year smoking history. She has never used smokeless tobacco. She reports current alcohol use. She reports that she does not use drugs. family history includes Allergies in her sister; Bone cancer in her father; Breast cancer in her sister; Cancer in her sister; Clotting disorder in her father and sister; Emphysema in her father, mother, and sister; Heart disease in her father and mother; Lung cancer in her father and mother; Prostate cancer in her father; Rheum arthritis in her father, mother, and sister. Allergies  Allergen Reactions  . Lipitor [Atorvastatin] Shortness Of Breath, Diarrhea and Other (See Comments)    Dizzy, pain all over.  . Codeine   . Hydromorphone Hcl     REACTION: dementia  . Levaquin [Levofloxacin In D5w]     Shock per pt but can take cipro  . Morphine   . Oxycodone-Acetaminophen   . Propoxyphene N-Acetaminophen   . Simvastatin    Current Outpatient Medications on File Prior to Visit  Medication Sig Dispense Refill  . acetaminophen (TYLENOL) 500 MG tablet Take 500 mg by mouth every 6 (six) hours as needed.    . Ascorbic Acid (VITAMIN C PO) Take by mouth.    Marland Kitchen aspirin EC 81 MG tablet Take 81 mg by mouth daily.    . blood glucose meter kit and supplies Dispense based on patient and insurance preference. Use up  to four times daily as directed. (FOR ICD-10 E11.9). 1 each 0  . CALCIUM-MAGNESIUM-ZINC PO Take 1 tablet by mouth daily.    . cholecalciferol (VITAMIN D) 1000 units tablet Take 5,000 Units by mouth daily.    Marland Kitchen diltiazem (CARDIZEM CD) 180 MG 24 hr capsule TAKE 1 CAPSULE (180 MG TOTAL) BY MOUTH DAILY. 90 capsule 2  . escitalopram (LEXAPRO) 20 MG tablet TAKE 1 TABLET (20 MG TOTAL) BY MOUTH AT BEDTIME. 90 tablet 3  . fenofibrate 160 MG tablet TAKE 1 TABLET EVERY DAY 90 tablet 1  . fluconazole (DIFLUCAN) 150 MG tablet 1 tab by mouth every 3 days as needed 2 tablet 1  .  fluocinonide (LIDEX) 0.05 % external solution APP EXT AA OF SCALP NIGHTLY  3  . ipratropium-albuterol (DUONEB) 0.5-2.5 (3) MG/3ML SOLN Take 3 mLs by nebulization every 4 (four) hours as needed. 360 mL 0  . levothyroxine (SYNTHROID) 100 MCG tablet Take 1 tablet (100 mcg total) by mouth daily before breakfast. 90 tablet 1  . lisinopril (ZESTRIL) 5 MG tablet TAKE 1 TABLET EVERY DAY 90 tablet 1  . meloxicam (MOBIC) 7.5 MG tablet Take 1 tablet (7.5 mg total) by mouth daily. 90 tablet 2  . potassium chloride SA (KLOR-CON) 20 MEQ tablet TAKE 1 TABLET (20 MEQ) BY MOUTH DAILY WITH FOOD 90 tablet 3  . rosuvastatin (CRESTOR) 20 MG tablet TAKE 1 TABLET (20 MG TOTAL) BY MOUTH DAILY. 90 tablet 3  . tiZANidine (ZANAFLEX) 2 MG tablet Take 1 tablet (2 mg total) by mouth 3 (three) times daily. 270 tablet 1  . traMADol (ULTRAM) 50 MG tablet Take 1 tablet (50 mg total) by mouth 4 (four) times daily. 360 tablet 1  . traZODone (DESYREL) 50 MG tablet TAKE 1 TABLET (50 MG TOTAL) BY MOUTH AT BEDTIME AS NEEDED FOR SLEEP. 90 tablet 1  . vitamin B-12 (CYANOCOBALAMIN) 1000 MCG tablet Take 500 mcg by mouth daily.    . vitamin C (ASCORBIC ACID) 500 MG tablet Take 500 mg by mouth daily.    . Vitamin D, Ergocalciferol, (DRISDOL) 50000 units CAPS capsule Take 1 capsule (50,000 Units total) by mouth every 7 (seven) days. 12 capsule 0  . vitamin E 400 UNIT capsule Take 200 Units by mouth daily.     No current facility-administered medications on file prior to visit.    Observations/Objective: Alert, NAD, appropriate mood and affect, resps normal, cn 2-12 intact, moves all 4s, no visible rash or swelling Lab Results  Component Value Date   WBC 7.1 12/17/2019   HGB 10.9 (L) 12/17/2019   HCT 33.2 (L) 12/17/2019   PLT 362 12/17/2019   GLUCOSE 108 (H) 12/17/2019   CHOL 123 12/17/2019   TRIG 108 12/17/2019   HDL 51 12/17/2019   LDLDIRECT 138.0 02/05/2017   LDLCALC 53 12/17/2019   ALT 13 12/17/2019   AST 18 12/17/2019   NA  130 (L) 12/17/2019   K 4.6 12/17/2019   CL 88 (L) 12/17/2019   CREATININE 1.50 (H) 12/17/2019   BUN 39 (H) 12/17/2019   CO2 30 12/17/2019   TSH 1.04 12/17/2019   HGBA1C 5.2 12/17/2019   Assessment and Plan: See notes  Follow Up Instructions: See notes   I discussed the assessment and treatment plan with the patient. The patient was provided an opportunity to ask questions and all were answered. The patient agreed with the plan and demonstrated an understanding of the instructions.   The patient was advised to call back or  seek an in-person evaluation if the symptoms worsen or if the condition fails to improve as anticipated.  Cathlean Cower, MD

## 2020-02-08 NOTE — Patient Instructions (Signed)
Please take all new medication as prescribed 

## 2020-02-09 ENCOUNTER — Ambulatory Visit: Payer: Medicare HMO | Admitting: Family Medicine

## 2020-02-09 DIAGNOSIS — G4733 Obstructive sleep apnea (adult) (pediatric): Secondary | ICD-10-CM | POA: Diagnosis not present

## 2020-02-09 DIAGNOSIS — J8410 Pulmonary fibrosis, unspecified: Secondary | ICD-10-CM | POA: Diagnosis not present

## 2020-02-09 DIAGNOSIS — J449 Chronic obstructive pulmonary disease, unspecified: Secondary | ICD-10-CM | POA: Diagnosis not present

## 2020-02-09 DIAGNOSIS — J9621 Acute and chronic respiratory failure with hypoxia: Secondary | ICD-10-CM | POA: Diagnosis not present

## 2020-02-10 ENCOUNTER — Other Ambulatory Visit: Payer: Self-pay

## 2020-02-10 ENCOUNTER — Encounter: Payer: Self-pay | Admitting: Physical Therapy

## 2020-02-10 ENCOUNTER — Ambulatory Visit: Payer: Medicare HMO | Attending: Family Medicine | Admitting: Physical Therapy

## 2020-02-10 DIAGNOSIS — J8410 Pulmonary fibrosis, unspecified: Secondary | ICD-10-CM | POA: Diagnosis not present

## 2020-02-10 DIAGNOSIS — R2689 Other abnormalities of gait and mobility: Secondary | ICD-10-CM | POA: Diagnosis not present

## 2020-02-10 DIAGNOSIS — M5441 Lumbago with sciatica, right side: Secondary | ICD-10-CM | POA: Insufficient documentation

## 2020-02-10 DIAGNOSIS — M6281 Muscle weakness (generalized): Secondary | ICD-10-CM | POA: Diagnosis not present

## 2020-02-10 DIAGNOSIS — G8929 Other chronic pain: Secondary | ICD-10-CM

## 2020-02-10 DIAGNOSIS — J439 Emphysema, unspecified: Secondary | ICD-10-CM | POA: Diagnosis not present

## 2020-02-10 NOTE — Therapy (Addendum)
[place], [place], [address] Phone: [phone]   Fax:  [phone]  Physical Therapy Evaluation / Discharge  Patient Details  Name: Dawn Gomez MRN: [id number] Date of Birth: [birth year] Referring Provider (PT): Gregor Hams, MD   Encounter Date: 02/10/2020   PT End of Session - 02/10/20 0845    Visit Number 1    Number of Visits 8    Date for PT Re-Evaluation 04/06/20    Authorization Type [employer] MEDICARE    Progress Note Due on Visit 10    PT Start Time 0836    PT Stop Time 0915    PT Time Calculation (min) 39 min    Activity Tolerance Patient tolerated treatment well    Behavior During Therapy [facility] for tasks assessed/performed           Past Medical History:  Diagnosis Date  . Abnormal heart rhythm   . Aortic aneurysm ([place])   . Aortic atherosclerosis ([place]) 03/03/2019  . Dyspnea   . Hypothyroid   . OSA (obstructive sleep apnea)    on cpap  . Psoriasis 02/05/2017  . Pulmonary fibrosis ([place])   . SVT (supraventricular tachycardia) ([place])   . Thyroid disease    hypo    Past Surgical History:  Procedure Laterality Date  . ABDOMINAL HYSTERECTOMY    . APPENDECTOMY    . CHOLECYSTECTOMY    . CRANIOTOMY     for aneurysms  . CRANIOTOMY  1980 x 2   aneurysmal clipping  . HERNIA REPAIR    . TOTAL ABDOMINAL HYSTERECTOMY    . TUBAL LIGATION      There were no vitals filed for this visit.    Subjective Assessment - 02/10/20 0839    Subjective Patient reports lower back pain mainly on the right side, all the way down the back of the right leg to her foot. She also notes sometimes she feels it on the left side. This has been going on for a long time and has not gotten better. Patient states no mechanism of injury, gradually started in right hip and started going down leg. She does exercise but that doesn't seem to help.    Pertinent History Patient on supplemental O2    Limitations  House hold activities;Standing;Walking;Lifting    How long can you sit comfortably? No limitation    How long can you stand comfortably? "Not long"    How long can you walk comfortably? "Short walks in house"    Diagnostic tests X-ray    Patient Stated Goals Get back and hip feeling better to move better    Currently in Pain? Yes    Pain Score 6     Pain Location Back    Pain Orientation Lower;Right    Pain Descriptors / Indicators Sharp    Pain Type Chronic pain    Pain Radiating Towards right hip, posterior leg down to foot    Pain Onset More than a month ago    Pain Frequency Constant    Aggravating Factors  Walking, standing    Pain Relieving Factors Rest, stretching and exercises    Effect of Pain on Daily Activities Patient limited mainly with walking              [facility] PT Assessment - 02/10/20 0001      Assessment   Medical Diagnosis Right lumbar radiculopathy    Referring Provider (PT) Gregor Hams, MD    Onset  Date/Surgical Date --   many months   Next MD Visit 02/15/2020    Prior Therapy None      Precautions   Precautions None      Restrictions   Weight Bearing Restrictions No      Balance Screen   Has the patient fallen in the past 6 months No    Has the patient had a decrease in activity level because of a fear of falling?  No    Is the patient reluctant to leave their home because of a fear of falling?  No      Home Environment   Living Environment Private residence    Living Arrangements Children    Type of [place] Access Stairs to enter    Entrance Stairs-Number of Steps 10-13 with landing    Additional Comments Patient reports difficulty negotiating stairs due to right hip      Prior Function   Level of Independence Independent      Cognition   Overall Cognitive Status Within Functional Limits for tasks assessed      Observation/Other Assessments   Observations Patient appears in no apparent distress, she is using supplemental O2  at 2.5    Focus on Therapeutic Outcomes (FOTO)  40% functional status      Observation/Other Assessments-Edema    Edema --   Bilat lower leg edema secondary to CHF     Sensation   Light Touch Appears Intact      Coordination   Gross Motor Movements are Fluid and Coordinated Yes      Posture/Postural Control   Posture Comments Patient exhibits slight increase in lumbar lordosis with standing      ROM / Strength   AROM / PROM / Strength AROM;PROM;Strength      AROM   Overall AROM Comments Patient notes overall feeling better following AROM and repeated motions    AROM Assessment Site Lumbar    Lumbar Flexion WFL    Lumbar Extension WFL    Lumbar - Right Side Bend WFL    Lumbar - Left Side Bend WFL - patient reports "pulling" left hip    Lumbar - Right Rotation WFL    Lumbar - Left Rotation WFL      PROM   Overall PROM Comments Hip PROM grossly WFL and non-painfuk      Strength   Overall Strength Comments Increased right hip discomfort with right hip muscle testing    Strength Assessment Site Hip;Knee    Right/Left Hip Right;Left    Right Hip Flexion 4-/5    Right Hip Extension 3+/5    Right Hip ABduction 3+/5    Left Hip Flexion 4-/5    Left Hip Extension 3+/5    Left Hip ABduction 3+/5    Right/Left Knee Right;Left    Right Knee Flexion 4/5    Right Knee Extension 4/5    Left Knee Flexion 4/5    Left Knee Extension 4/5      Flexibility   Soft Tissue Assessment /Muscle Length yes    Hamstrings Grossly WFL    Quadriceps Limited bilaterally    Piriformis Limited bilaterally - increased stretch on right      Palpation   Spinal mobility Not assessed    Palpation comment TTP right gluteal and piriformis region mainly, mild TTP right lumbar paraspinals      Transfers   Transfers Independent with all Transfers  Objective measurements completed on examination: See above findings.       [place] Adult PT Treatment/Exercise -  02/10/20 0001      Exercises   Exercises Lumbar      Lumbar Exercises: Stretches   Hip Flexor Stretch 2 reps;30 seconds    Hip Flexor Stretch Limitations supine edge of table with strap    Piriformis Stretch 2 reps;30 seconds    Piriformis Stretch Limitations supine figure-4      Lumbar Exercises: Supine   Clam 10 reps    Clam Limitations yellow band    Bridge 10 reps                  PT Education - 02/10/20 0849    Education Details Exam findings, POC, HEP    Person(s) Educated Patient    Methods Explanation;Demonstration;Tactile cues;Verbal cues;Handout    Comprehension Verbalized understanding;Returned demonstration;Verbal cues required;Tactile cues required;Need further instruction            PT Short Term Goals - 02/10/20 0850      PT SHORT TERM GOAL #1   Title Patient will be I with initial HEP to progress with PT    Time 4    Period Weeks    Status New    Target Date 03/09/20      PT SHORT TERM GOAL #2   Title Patient will be able to walk household distances with </=3/10 pain level to improve ADLs    Time 4    Period Weeks    Status New    Target Date 03/09/20             PT Long Term Goals - 02/10/20 0850      PT LONG TERM GOAL #1   Title Patient will be I with final HEP to maintain progress from PT    Time 8    Period Weeks    Status New    Target Date 04/06/20      PT LONG TERM GOAL #2   Title Patient will report improved functiona status >/= 58% on FOTO    Time 8    Period Weeks    Status New    Target Date 04/06/20      PT LONG TERM GOAL #3   Title Patient will be able to walk community level distances with </= 2/10 pain to improve shopping and community access    Time 8    Period Weeks    Status New    Target Date 04/06/20      PT LONG TERM GOAL #4   Title Patient will exhibit >/= 4/5 MMT grossly for hips and core to improve lifting and household tasks    Time 8    Period Weeks    Status New    Target Date 04/06/20                   Plan - 02/10/20 0851    Clinical Impression Statement Patient presents to PT with report of chronic right sided lower back, gluteal and leg pain that occurs mainly with standing and walking tasks. She did not exhibit any radicular symptoms this visit, she does demonstrate good lumbar range of motion without any reproduction of symptoms, hip motion grossly WFL, she did demonstrate some right hip tightness and exhibit gross strength deficits of core and hips. She was provided exercises to initiate strengthening and stretching program and she would benefit from continued skilled PT to progress her  strength and mobility in order to reduce pain and maximize functional level.    Personal Factors and Comorbidities Age;Fitness;Past/Current Experience;Time since onset of injury/illness/exacerbation;Transportation;Comorbidity 3+    Comorbidities History of lung disease, depression, CHF, BMI    Examination-Activity Limitations Locomotion Level;Bend;Stairs;Stand;Lift    Examination-Participation Restrictions Cleaning;Community Activity;Shop;Laundry;Yard Work;Driving    Stability/Clinical Decision Making Evolving/Moderate complexity    Clinical Decision Making Moderate    Rehab Potential Good    PT Frequency 1x / week    PT Duration 8 weeks    PT Treatment/Interventions ADLs/Self Care Home Management;Cryotherapy;Electrical Stimulation;Iontophoresis 37m/ml Dexamethasone;Moist Heat;Ultrasound;Neuromuscular re-education;Balance training;Therapeutic exercise;Therapeutic activities;Functional mobility training;Stair training;Gait training;Patient/family education;Manual techniques;Dry needling;Passive range of motion;Taping;Spinal Manipulations;Joint Manipulations    PT Next Visit Plan Review HEP and progess PRN, continue lumbar and hip mobility, manual for right gluteal region, general core and hip strengthening    PT Home Exercise Plan [id number] piriformis stretch, supine hip flexor/quad  stretch, supine clam with yellow, bridge    Consulted and Agree with Plan of Care Patient           Patient will benefit from skilled therapeutic intervention in order to improve the following deficits and impairments:  Difficulty walking, Cardiopulmonary status limiting activity, Decreased activity tolerance, Pain, Impaired flexibility, Decreased strength, Postural dysfunction  Visit Diagnosis: Chronic right-sided low back pain with right-sided sciatica  Muscle weakness (generalized)  Other abnormalities of gait and mobility     Problem List Patient Active Problem List   Diagnosis Date Noted  . Right lumbar radiculopathy 12/17/2019  . Emphysema lung ([place] 03/03/2019  . Aortic atherosclerosis ([place] 03/03/2019  . Healthcare maintenance 02/01/2019  . Right ankle pain 10/06/2018  . Sinusitis 08/28/2018  . Costochondritis 04/10/2018  . Leg pain 03/25/2018  . Fall 08/13/2017    Class: History of  . Night sweats 07/13/2017  . Dysuria 07/08/2017  . Psoriasis 02/05/2017  . Encounter for well adult exam with abnormal findings 02/05/2017  . Chest pain 02/05/2017  . Back pain 02/05/2017  . Acute on chronic respiratory failure with hypoxia ([place] 08/06/2016  . Hypothyroidism 08/06/2016  . Hyperglycemia 08/06/2016  . Allergic rhinitis 07/16/2016  . Chest wall mass 07/16/2016  . Hyperlipidemia 07/12/2016  . SVT (supraventricular tachycardia) ([place] 07/12/2016  . Vitamin D deficiency 07/12/2016  . Depression 07/12/2016  . Chronic respiratory failure ([place] 03/24/2015  . Interstitial lung disease ([place] 08/21/2007    CHilda Blades PT, DPT, LAT, ATC 02/10/20  1:15 PM Phone: [phone]: 3[facility] [address]Phone: [phone]  Fax:  [phone] Name: BKashmere DaywaltMRN: [id number] of Birth: 101-23-1937  PHYSICAL THERAPY DISCHARGE SUMMARY  Visits from  Start of Care: 1  Current functional level related to goals / functional outcomes: See above   Remaining deficits: See above   Education / Equipment: HEP Plan:                                                    Patient goals were not met. Patient is being discharged due to not returning since the last visit.  ?????    CHilda Blades PT, DPT, LAT, ATC 04/10/20  9:06 AM Phone: [phone]: [phone]

## 2020-02-10 NOTE — Patient Instructions (Signed)
Access Code: [email or web address] URL: [email or web address] Date: 02/10/2020 Prepared by: Hilda Blades  Exercises Supine Piriformis Stretch with Foot on Ground - 2 x daily - 7 x weekly - 3 reps - 20 seconds hold Supine Quadriceps Stretch with Strap on Table - 2 x daily - 7 x weekly - 3 reps - 20 seconds hold Hooklying Clamshell with Resistance - 2 x daily - 7 x weekly - 2 sets - 10 reps - 3 seconds hold Bridge - 2 x daily - 7 x weekly - 2 sets - 10 reps - 3 seconds hold

## 2020-02-12 DIAGNOSIS — J449 Chronic obstructive pulmonary disease, unspecified: Secondary | ICD-10-CM | POA: Diagnosis not present

## 2020-02-12 DIAGNOSIS — G4733 Obstructive sleep apnea (adult) (pediatric): Secondary | ICD-10-CM | POA: Diagnosis not present

## 2020-02-12 DIAGNOSIS — J8410 Pulmonary fibrosis, unspecified: Secondary | ICD-10-CM | POA: Diagnosis not present

## 2020-02-12 DIAGNOSIS — J9621 Acute and chronic respiratory failure with hypoxia: Secondary | ICD-10-CM | POA: Diagnosis not present

## 2020-02-14 ENCOUNTER — Ambulatory Visit: Payer: Medicare HMO | Admitting: Family Medicine

## 2020-02-14 NOTE — Progress Notes (Deleted)
   I, Peterson Lombard, LAT, ATC acting as a scribe for Lynne Leader, MD.  Dawn Gomez is a 84 y.o. female who presents to [place] [facility] today for f/u chronic LBPn, right lumbar radiculopathy. Pt was last seen by Dr. Georgina Snell on 12/23/19 and was advised to try PT and CT myelogram lumbar spine. Pt was referred to PT, of which she's completed 8 visits. Since her last visit, pt reports  Dx imaging: 01/03/20 L-spine XR  Pertinent review of systems: ***  Relevant historical information: ***   Exam:  There were no vitals taken for this visit. General: Well Developed, well nourished, and in no acute distress.   MSK: ***    Lab and Radiology Results No results found for this or any previous visit (from the past 72 hour(s)). No results found.     Assessment and Plan: 84 y.o. female with ***   PDMP not reviewed this encounter. No orders of the defined types were placed in this encounter.  No orders of the defined types were placed in this encounter.    Discussed warning signs or symptoms. Please see discharge instructions. Patient expresses understanding.   ***

## 2020-02-15 ENCOUNTER — Ambulatory Visit: Payer: Medicare HMO

## 2020-02-17 ENCOUNTER — Other Ambulatory Visit: Payer: Self-pay | Admitting: Internal Medicine

## 2020-02-18 DIAGNOSIS — J9621 Acute and chronic respiratory failure with hypoxia: Secondary | ICD-10-CM | POA: Diagnosis not present

## 2020-02-18 DIAGNOSIS — G4733 Obstructive sleep apnea (adult) (pediatric): Secondary | ICD-10-CM | POA: Diagnosis not present

## 2020-02-18 DIAGNOSIS — J449 Chronic obstructive pulmonary disease, unspecified: Secondary | ICD-10-CM | POA: Diagnosis not present

## 2020-02-18 DIAGNOSIS — J8410 Pulmonary fibrosis, unspecified: Secondary | ICD-10-CM | POA: Diagnosis not present

## 2020-02-23 ENCOUNTER — Other Ambulatory Visit: Payer: Self-pay | Admitting: Internal Medicine

## 2020-02-23 ENCOUNTER — Ambulatory Visit: Payer: Medicare HMO

## 2020-02-24 ENCOUNTER — Telehealth: Payer: Self-pay | Admitting: Internal Medicine

## 2020-02-24 NOTE — Telephone Encounter (Signed)
    Patient states since her last virtual visit she is still having headache, and stuffy nose, sneezing, cough Patient requesting  additional amoxicillin-clavulanate (AUGMENTIN) 875-125 MG tablet  Patient also requesting Diflucan for yeast

## 2020-03-01 ENCOUNTER — Ambulatory Visit: Payer: Medicare HMO

## 2020-03-01 MED ORDER — FLUCONAZOLE 150 MG PO TABS
ORAL_TABLET | ORAL | 1 refills | Status: DC
Start: 2020-03-01 — End: 2020-05-04

## 2020-03-01 NOTE — Telephone Encounter (Signed)
Sent to Dr. John. 

## 2020-03-01 NOTE — Telephone Encounter (Signed)
I wouldn't feel comfortable with more augmentin, sounds like she will continue to get better  [facility] for diflucan

## 2020-03-08 ENCOUNTER — Ambulatory Visit: Payer: Medicare HMO

## 2020-03-10 ENCOUNTER — Telehealth (INDEPENDENT_AMBULATORY_CARE_PROVIDER_SITE_OTHER): Payer: Medicare HMO | Admitting: Internal Medicine

## 2020-03-10 DIAGNOSIS — E559 Vitamin D deficiency, unspecified: Secondary | ICD-10-CM

## 2020-03-10 DIAGNOSIS — R739 Hyperglycemia, unspecified: Secondary | ICD-10-CM

## 2020-03-10 DIAGNOSIS — J329 Chronic sinusitis, unspecified: Secondary | ICD-10-CM | POA: Diagnosis not present

## 2020-03-10 MED ORDER — DOXYCYCLINE HYCLATE 100 MG PO TABS: 100.0000 mg | ORAL_TABLET | Freq: Two times a day (BID) | ORAL | 0 refills | Status: DC

## 2020-03-10 NOTE — Patient Instructions (Signed)
Please take all new medication as prescribed - the 14 days doxycycline

## 2020-03-10 NOTE — Progress Notes (Signed)
Patient ID: Dawn Gomez, female   DOB: 02-25-1936, 84 y.o.   MRN: [id number]  Cumulative time during 7-day interval 12 min, there was not an associated office visit for this concern within a 7 day period. Verbal consent for services obtained from patient prior to services given. Names of all persons present for services: Cathlean Cower, MD, patient Chief complaint: sinus pain History, background, results pertinent:   Here with 2-3 days acute onset fever, facial pain, pressure, headache, general weakness and malaise, and greenish d/c, with mild ST and cough, but pt denies chest pain, wheezing, increased sob or doe, orthopnea, PND, increased LE swelling, palpitations, dizziness or syncope.  Pt denies polydipsia, polyuria,   Past Medical History:  Diagnosis Date  . Abnormal heart rhythm   . Aortic aneurysm ([place])   . Aortic atherosclerosis ([place]) 03/03/2019  . Dyspnea   . Hypothyroid   . OSA (obstructive sleep apnea)    on cpap  . Psoriasis 02/05/2017  . Pulmonary fibrosis ([place])   . SVT (supraventricular tachycardia) ([place])   . Thyroid disease    hypo   No results found for this or any previous visit (from the past 48 hour(s)). A/P/next steps:   See notes  Cathlean Cower MD

## 2020-03-11 ENCOUNTER — Encounter: Payer: Self-pay | Admitting: Internal Medicine

## 2020-03-11 NOTE — Assessment & Plan Note (Signed)
Mild to mod, for antibx course,  to f/u any worsening symptoms or concerns 

## 2020-03-11 NOTE — Assessment & Plan Note (Signed)
Cont oral replacement 

## 2020-03-11 NOTE — Assessment & Plan Note (Signed)
stable overall by history and exam, recent data reviewed with pt, and pt to continue medical treatment as before,  to f/u any worsening symptoms or concerns  

## 2020-03-13 DIAGNOSIS — J449 Chronic obstructive pulmonary disease, unspecified: Secondary | ICD-10-CM | POA: Diagnosis not present

## 2020-03-13 DIAGNOSIS — J9621 Acute and chronic respiratory failure with hypoxia: Secondary | ICD-10-CM | POA: Diagnosis not present

## 2020-03-13 DIAGNOSIS — G4733 Obstructive sleep apnea (adult) (pediatric): Secondary | ICD-10-CM | POA: Diagnosis not present

## 2020-03-13 DIAGNOSIS — J8410 Pulmonary fibrosis, unspecified: Secondary | ICD-10-CM | POA: Diagnosis not present

## 2020-03-23 ENCOUNTER — Telehealth: Payer: Self-pay | Admitting: Internal Medicine

## 2020-03-23 NOTE — Telephone Encounter (Signed)
   [employer] calling to request order for [facility] True Metrix glucometer, test strips and lancets

## 2020-03-24 MED ORDER — BLOOD GLUCOSE METER KIT
PACK | 0 refills | Status: DC
Start: 2020-03-24 — End: 2020-06-22

## 2020-03-24 MED ORDER — BLOOD GLUCOSE METER KIT
PACK | 0 refills | Status: DC
Start: 2020-03-24 — End: 2020-03-24

## 2020-03-24 NOTE — Addendum Note (Signed)
Addended by: Hinda Kehr on: 03/24/2020 08:22 AM   Modules accepted: Orders

## 2020-04-05 ENCOUNTER — Telehealth: Payer: Self-pay | Admitting: Internal Medicine

## 2020-04-05 NOTE — Telephone Encounter (Signed)
Patient called and said that she received a letter from [facility] stating that several of her medications needed to be sent in for renewal. She did not have the names of the medications. She can be reached at [phone].

## 2020-04-11 NOTE — Telephone Encounter (Signed)
Tried calling pt to see what meds were needed. Pt phone rang out, no vm was present at the time.

## 2020-04-12 ENCOUNTER — Other Ambulatory Visit: Payer: Self-pay | Admitting: Internal Medicine

## 2020-04-12 ENCOUNTER — Telehealth: Payer: Self-pay | Admitting: Pulmonary Disease

## 2020-04-12 NOTE — Telephone Encounter (Signed)
diltiazem (CARDIZEM CD) 180 MG 24 hr capsule potassium chloride SA (KLOR-CON) 20 MEQ tablet furosemide (LASIX) 80 MG tablet lisinopril (ZESTRIL) 5 MG tablet levothyroxine (SYNTHROID) 100 MCG tablet fenofibrate 160 MG tablet tiZANidine (ZANAFLEX) 2 MG tablet escitalopram (LEXAPRO) 20 MG tablet traZODone (DESYREL) 50 MG tablet rosuvastatin (CRESTOR) 20 MG tablet traMADol (ULTRAM) 50 MG tablet  Patient calling back to inform us of the medication

## 2020-04-12 NOTE — Telephone Encounter (Signed)
Please refill as per office routine med refill policy (all routine meds refilled for 3 mo or monthly per pt preference up to one year from last visit, then month to month grace period for 3 mo, then further med refills will have to be denied)  

## 2020-04-12 NOTE — Telephone Encounter (Signed)
Called and spoke with pt who states she has been exposed to covid as her son and other family members tested positive to covid about a week ago. Pt states she is doing fine and has no symptoms/no complaints.  Pt has been checking her temp and has not been running any fever as last temp yesterday 1/4 was 97.2.  Due to when Dr. Matilde Bash first available appt was, stated to pt that we are okay scheduling an appt and she verbalized understanding. Scheduled pt an appt with Dr. Vaughan Browner 05/04/20. Stated to pt if she started having any covid like symptoms to call office and we could further address with Dr. Vaughan Browner and she verbalized understanding. Nothing further needed.

## 2020-04-13 ENCOUNTER — Other Ambulatory Visit: Payer: Self-pay

## 2020-04-13 DIAGNOSIS — J449 Chronic obstructive pulmonary disease, unspecified: Secondary | ICD-10-CM | POA: Diagnosis not present

## 2020-04-13 DIAGNOSIS — G4733 Obstructive sleep apnea (adult) (pediatric): Secondary | ICD-10-CM | POA: Diagnosis not present

## 2020-04-13 DIAGNOSIS — J9621 Acute and chronic respiratory failure with hypoxia: Secondary | ICD-10-CM | POA: Diagnosis not present

## 2020-04-13 DIAGNOSIS — J8410 Pulmonary fibrosis, unspecified: Secondary | ICD-10-CM | POA: Diagnosis not present

## 2020-04-13 MED ORDER — ROSUVASTATIN CALCIUM 20 MG PO TABS: 20.0000 mg | ORAL_TABLET | Freq: Every day | ORAL | 3 refills | Status: DC

## 2020-04-13 MED ORDER — LEVOTHYROXINE SODIUM 100 MCG PO TABS
100.0000 ug | ORAL_TABLET | Freq: Every day | ORAL | 1 refills | Status: DC
Start: 2020-04-13 — End: 2020-10-06

## 2020-04-13 MED ORDER — TRAMADOL HCL 50 MG PO TABS: 50.0000 mg | ORAL_TABLET | Freq: Four times a day (QID) | ORAL | 1 refills | Status: DC

## 2020-04-13 MED ORDER — TIZANIDINE HCL 2 MG PO TABS: 2.0000 mg | ORAL_TABLET | Freq: Three times a day (TID) | ORAL | 1 refills | Status: DC

## 2020-04-13 MED ORDER — ESCITALOPRAM OXALATE 20 MG PO TABS
20.0000 mg | ORAL_TABLET | Freq: Every day | ORAL | 3 refills | Status: DC
Start: 2020-04-13 — End: 2021-05-14

## 2020-04-13 MED ORDER — DILTIAZEM HCL ER COATED BEADS 180 MG PO CP24
180.0000 mg | ORAL_CAPSULE | Freq: Every day | ORAL | 2 refills | Status: DC
Start: 2020-04-13 — End: 2021-03-06

## 2020-04-13 MED ORDER — POTASSIUM CHLORIDE CRYS ER 20 MEQ PO TBCR: EXTENDED_RELEASE_TABLET | ORAL | 3 refills | Status: DC

## 2020-04-13 MED ORDER — FENOFIBRATE 160 MG PO TABS
160.0000 mg | ORAL_TABLET | Freq: Every day | ORAL | 1 refills | Status: DC
Start: 2020-04-13 — End: 2020-09-18

## 2020-04-13 MED ORDER — TRAZODONE HCL 50 MG PO TABS: 50.0000 mg | ORAL_TABLET | Freq: Every evening | ORAL | 1 refills | Status: DC | PRN

## 2020-04-13 MED ORDER — LISINOPRIL 5 MG PO TABS
5.0000 mg | ORAL_TABLET | Freq: Every day | ORAL | 1 refills | Status: DC
Start: 2020-04-13 — End: 2021-01-24

## 2020-04-13 MED ORDER — FUROSEMIDE 80 MG PO TABS
80.0000 mg | ORAL_TABLET | Freq: Every day | ORAL | 3 refills | Status: DC
Start: 2020-04-13 — End: 2021-04-23

## 2020-04-13 NOTE — Addendum Note (Signed)
Addended by: Biagio Borg on: 04/13/2020 06:22 PM   Modules accepted: Orders

## 2020-04-13 NOTE — Telephone Encounter (Signed)
Medication has been sent in to [facility].

## 2020-04-28 ENCOUNTER — Other Ambulatory Visit: Payer: Self-pay | Admitting: Internal Medicine

## 2020-05-04 ENCOUNTER — Encounter: Payer: Self-pay | Admitting: Pulmonary Disease

## 2020-05-04 ENCOUNTER — Other Ambulatory Visit: Payer: Self-pay

## 2020-05-04 ENCOUNTER — Ambulatory Visit (INDEPENDENT_AMBULATORY_CARE_PROVIDER_SITE_OTHER): Payer: Medicare HMO | Admitting: Pulmonary Disease

## 2020-05-04 VITALS — BP 126/70 | HR 62 | Temp 97.9°F | Ht 59.0 in | Wt 161.4 lb

## 2020-05-04 DIAGNOSIS — J849 Interstitial pulmonary disease, unspecified: Secondary | ICD-10-CM

## 2020-05-04 DIAGNOSIS — J9611 Chronic respiratory failure with hypoxia: Secondary | ICD-10-CM | POA: Diagnosis not present

## 2020-05-04 DIAGNOSIS — J8410 Pulmonary fibrosis, unspecified: Secondary | ICD-10-CM | POA: Diagnosis not present

## 2020-05-04 NOTE — Patient Instructions (Signed)
I am glad that your breathing is stable We will schedule high-resolution CT and PFTs in the next 3 months Follow-up in clinic after the studies in 3 months.

## 2020-05-04 NOTE — Progress Notes (Unsigned)
Dawn Gomez    [id number]    [birth year]  Primary Care Physician:John, Hunt Oris, MD  Referring Physician: Biagio Borg, MD [address] [place],   [address]  Chief complaint: Follow-up for COPD, pulmonary fibrosis  HPI: 85 year old ex-smoker with chronic respiratory failure, emphysema, pulmonary fibrosis.  Previously followed by Dr. Lake Bells She has emphysema which is managed with duo nebs and supplemental oxygen Also has history of unspecified pulmonary fibrosis which has been stable over the years.  She is currently not on any treatment for pulmonary fibrosis  Pets: Has a dog  Occupation: Homemaker Exposures: No known exposures.  No mold, hot tub, Jacuzzi, no down pillows or comforters Smoking history: 100-pack-year smoker.  Quit in 2008 Travel history: No significant travel history Relevant family history: No significant family history of lung disease  Interim history: Patient reports feeling well. She presents for a follow up. Denies any changes in her breathing. She reports a sinus infection about 2-3 months ago that has resolved. She uses her nebulizer treatment about 2-3 times a week on average. No other complaints at this time.   Outpatient Encounter Medications as of 05/04/2020  Medication Sig  . vitamin B-12 (CYANOCOBALAMIN) 1000 MCG tablet Take 500 mcg by mouth daily.  . vitamin C (ASCORBIC ACID) 500 MG tablet Take 500 mg by mouth daily.  . Vitamin D, Ergocalciferol, (DRISDOL) 50000 units CAPS capsule Take 1 capsule (50,000 Units total) by mouth every 7 (seven) days.  . vitamin E 400 UNIT capsule Take 200 Units by mouth daily.  Marland Kitchen acetaminophen (TYLENOL) 500 MG tablet Take 500 mg by mouth every 6 (six) hours as needed.  . Ascorbic Acid (VITAMIN C PO) Take by mouth.  Marland Kitchen aspirin EC 81 MG tablet Take 81 mg by mouth daily.  . blood glucose meter kit and supplies Dispense based on patient and insurance preference. Use up to four times daily as  directed. (FOR ICD-10 E11.9).  . CALCIUM-MAGNESIUM-ZINC PO Take 1 tablet by mouth daily.  . cholecalciferol (VITAMIN D) 1000 units tablet Take 5,000 Units by mouth daily.  Marland Kitchen diltiazem (CARDIZEM CD) 180 MG 24 hr capsule Take 1 capsule (180 mg total) by mouth daily.  Marland Kitchen escitalopram (LEXAPRO) 20 MG tablet Take 1 tablet (20 mg total) by mouth at bedtime.  . fenofibrate 160 MG tablet Take 1 tablet (160 mg total) by mouth daily.  . fluocinonide (LIDEX) 0.05 % external solution APP EXT AA OF SCALP NIGHTLY  . furosemide (LASIX) 80 MG tablet Take 1 tablet (80 mg total) by mouth daily.  Marland Kitchen guaiFENesin (MUCINEX) 600 MG 12 hr tablet Take 2 tablets (1,200 mg total) by mouth 2 (two) times daily as needed.  Marland Kitchen ipratropium-albuterol (DUONEB) 0.5-2.5 (3) MG/3ML SOLN Take 3 mLs by nebulization every 4 (four) hours as needed.  Marland Kitchen levothyroxine (SYNTHROID) 100 MCG tablet Take 1 tablet (100 mcg total) by mouth daily before breakfast.  . lisinopril (ZESTRIL) 5 MG tablet Take 1 tablet (5 mg total) by mouth daily.  . potassium chloride SA (KLOR-CON) 20 MEQ tablet TAKE 1 TABLET EVERY DAY WITH FOOD  . rosuvastatin (CRESTOR) 20 MG tablet Take 1 tablet (20 mg total) by mouth daily.  Marland Kitchen tiZANidine (ZANAFLEX) 2 MG tablet Take 1 tablet (2 mg total) by mouth 3 (three) times daily.  . traMADol (ULTRAM) 50 MG tablet Take 1 tablet (50 mg total) by mouth 4 (four) times daily.  . traZODone (DESYREL) 50 MG tablet Take  1 tablet (50 mg total) by mouth at bedtime as needed for sleep.  . [DISCONTINUED] amoxicillin-clavulanate (AUGMENTIN) 875-125 MG tablet Take 1 tablet by mouth 2 (two) times daily.  . [DISCONTINUED] doxycycline (VIBRA-TABS) 100 MG tablet Take 1 tablet (100 mg total) by mouth 2 (two) times daily.  . [DISCONTINUED] fluconazole (DIFLUCAN) 150 MG tablet 1 tab by mouth every 3 days as needed  . [DISCONTINUED] meloxicam (MOBIC) 7.5 MG tablet TAKE 1 TABLET (7.5 MG TOTAL) BY MOUTH DAILY.   No facility-administered encounter  medications on file as of 05/04/2020.    Allergies as of 05/04/2020 - Review Complete 03/11/2020  Allergen Reaction Noted  . Lipitor [atorvastatin] Shortness Of Breath, Diarrhea, and Other (See Comments) 08/05/2016  . Codeine    . Hydromorphone hcl    . Levaquin [levofloxacin in d5w]  07/08/2017  . Morphine    . Oxycodone-acetaminophen  06/26/2007  . Propoxyphene n-acetaminophen    . Simvastatin  10/16/2010    Past Medical History:  Diagnosis Date  . Abnormal heart rhythm   . Aortic aneurysm ([place])   . Aortic atherosclerosis ([place]) 03/03/2019  . Dyspnea   . Hypothyroid   . OSA (obstructive sleep apnea)    on cpap  . Psoriasis 02/05/2017  . Pulmonary fibrosis ([place])   . SVT (supraventricular tachycardia) ([place])   . Thyroid disease    hypo    Past Surgical History:  Procedure Laterality Date  . ABDOMINAL HYSTERECTOMY    . APPENDECTOMY    . CHOLECYSTECTOMY    . CRANIOTOMY     for aneurysms  . CRANIOTOMY  1980 x 2   aneurysmal clipping  . HERNIA REPAIR    . TOTAL ABDOMINAL HYSTERECTOMY    . TUBAL LIGATION      Family History  Problem Relation Age of Onset  . Emphysema Father   . Heart disease Father   . Clotting disorder Father   . Rheum arthritis Father   . Lung cancer Father   . Prostate cancer Father   . Bone cancer Father   . Emphysema Sister   . Emphysema Mother   . Heart disease Mother   . Rheum arthritis Mother   . Lung cancer Mother   . Allergies Sister   . Clotting disorder Sister   . Rheum arthritis Sister   . Breast cancer Sister   . Cancer Sister        ureter    Social History   Socioeconomic History  . Marital status: Single    Spouse name: Not on file  . Number of children: 1  . Years of education: 72  . Highest education level: Not on file  Occupational History  . Occupation: retired Therapist, sports  . Occupation: retired  Tobacco Use  . Smoking status: Former Smoker    Packs/day: 2.00    Years: 50.00    Pack years: 100.00    Types:  Cigarettes    Quit date: 2008    Years since quitting: 14.0  . Smokeless tobacco: Never Used  Substance and Sexual Activity  . Alcohol use: Yes    Comment: "drank back in her younger wilder days"  . Drug use: No  . Sexual activity: Not on file  Other Topics Concern  . Not on file  Social History Narrative   ** Merged History Encounter **       Social Determinants of Health   Financial Resource Strain: Not on file  Food Insecurity: Not on file  Transportation Needs: Not  on file  Physical Activity: Not on file  Stress: Not on file  Social Connections: Not on file  Intimate Partner Violence: Not on file    Review of systems: Review of Systems  Constitutional: Negative for fever and chills.  HENT: Negative.   Eyes: Negative for blurred vision.  Respiratory: as per HPI  Cardiovascular: Negative for chest pain and palpitations.  Gastrointestinal: Negative for vomiting, diarrhea, blood per rectum. Genitourinary: Negative for dysuria, urgency, frequency and hematuria.  Musculoskeletal: Negative for myalgias, back pain and joint pain.  Skin: Negative for itching and rash.  Neurological: Negative for dizziness, tremors, focal weakness, seizures and loss of consciousness.  Endo/Heme/Allergies: Negative for environmental allergies.  Psychiatric/Behavioral: Negative for depression, suicidal ideas and hallucinations.  All other systems reviewed and are negative.  Physical Exam: Blood pressure 130/70, pulse 60, temperature (!) 97.4 F (36.3 C), height 4' 11"  (1.499 m), weight 171 lb 6.4 oz (77.7 kg), SpO2 100 %. Gen:      No acute distress HEENT:  EOMI, sclera anicteric Neck:     No masses; no thyromegaly Lungs:    Clear to auscultation bilaterally; normal respiratory effort CV:         Regular rate and rhythm; no murmurs Abd:      + bowel sounds; soft, non-tender; no palpable masses, no distension Ext:    No edema; adequate peripheral perfusion Skin:      Warm and dry; no  rash Neuro: alert and oriented x 3 Psych: normal mood and affect  Data Reviewed: Imaging: CT high-resolution 12/24/2018-fibrotic interstitial lung disease with central and peripheral involvement.  No basal gradient with no progression.  Alternate diagnosis Emphysematous changes.  PFTs: 02/09/2018 FVC 1.71 [78%], FEV1 1.50 [93%], F/F 88, TLC 3.29 [73%], DLCO 5.72 [30%] Mild restriction with severely reduced diffusion capacity Stable lung function compared to 2017  Labs: 2009:  ANA, RF, ACE, ESR, all ok.  Autoimmune 01/2014:  Negative 04/2015 ANA, RF, CCP, JO-1, SSA, SSB, SCL-70, Anti-centromere all negative  Assessment:  Chronic respiratory failure, emphysema Continues on nebulizer therapy supplemental oxygen  Interstitial lung disease Last HRCT 11/120 showed nonspecific fibrotic changes in alternate pattern. Stable CT scan and PFT's for several years Stable on 4L supplemental oxygen Will repeat PFT's and HRCT  Plan/Recommendations: Repeat PFT's and HRCT Continue nebulizers Follow-up in [place], DO PGY-2 IM  [facility] 05/04/2020, 2:54 PM  CC: Biagio Borg, MD

## 2020-05-05 NOTE — Progress Notes (Signed)
Attending note: I have seen and examined the patient. History, labs and imaging reviewed. Agree with assessment and plan as noted by Dr. Laural Golden.  Marshell Garfinkel MD [facility] 05/05/2020, 10:39 AM

## 2020-05-14 DIAGNOSIS — G4733 Obstructive sleep apnea (adult) (pediatric): Secondary | ICD-10-CM | POA: Diagnosis not present

## 2020-05-14 DIAGNOSIS — J9621 Acute and chronic respiratory failure with hypoxia: Secondary | ICD-10-CM | POA: Diagnosis not present

## 2020-05-14 DIAGNOSIS — J8410 Pulmonary fibrosis, unspecified: Secondary | ICD-10-CM | POA: Diagnosis not present

## 2020-05-14 DIAGNOSIS — J449 Chronic obstructive pulmonary disease, unspecified: Secondary | ICD-10-CM | POA: Diagnosis not present

## 2020-06-11 DIAGNOSIS — J8410 Pulmonary fibrosis, unspecified: Secondary | ICD-10-CM | POA: Diagnosis not present

## 2020-06-11 DIAGNOSIS — G4733 Obstructive sleep apnea (adult) (pediatric): Secondary | ICD-10-CM | POA: Diagnosis not present

## 2020-06-11 DIAGNOSIS — J449 Chronic obstructive pulmonary disease, unspecified: Secondary | ICD-10-CM | POA: Diagnosis not present

## 2020-06-11 DIAGNOSIS — J9621 Acute and chronic respiratory failure with hypoxia: Secondary | ICD-10-CM | POA: Diagnosis not present

## 2020-06-15 IMAGING — CT CT CHEST HIGH RESOLUTION W/O CM
2 of 6 series · 14 of 36 positions shown, 17 images · non-contrast
Comparison: 08/06/2016 high-resolution chest CT.

CLINICAL DATA: Follow-up interstitial lung disease. Upper back
pain.

EXAM:
CT CHEST WITHOUT CONTRAST
TECHNIQUE: Multidetector CT imaging of the chest was performed following the
standard protocol without intravenous contrast. High resolution
imaging of the lungs, as well as inspiratory and expiratory imaging,
was performed.

[Series 2: high resolution · axial · 0.65mm/px · z∈[-275,-25]mm · 11 of 139 slices shown, 14 images]
[im 7/139  mediastinal]
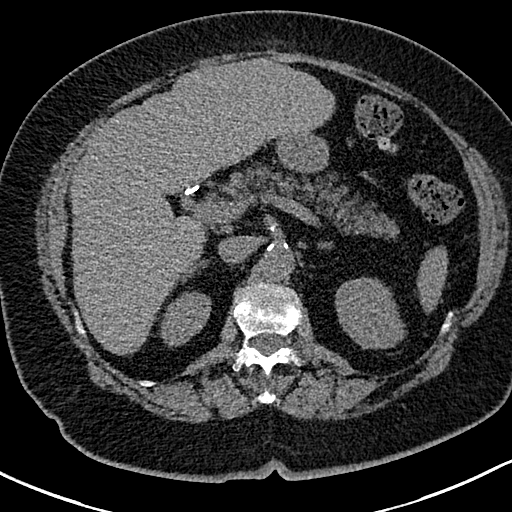
[im 7/139  lung]
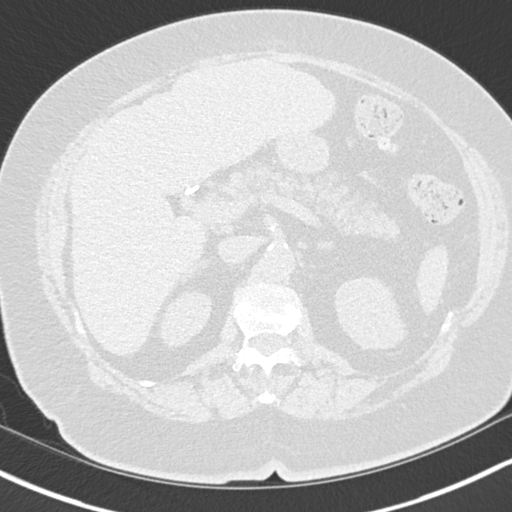
[im 20/139  lung]
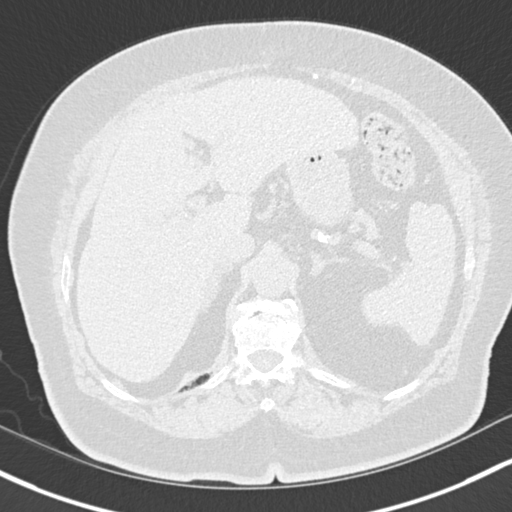
[im 33/139  lung]
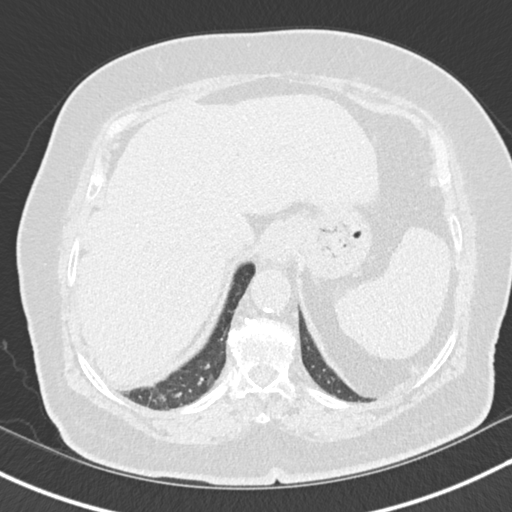
[im 47/139  lung]
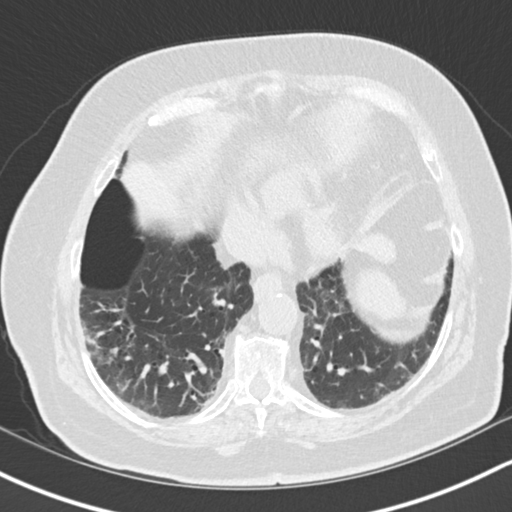
[im 60/139  mediastinal]
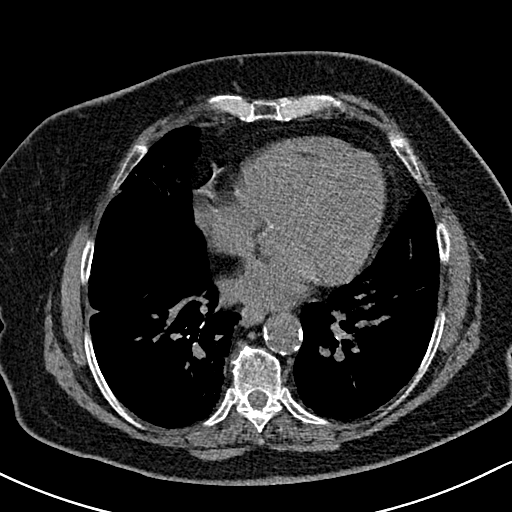
[im 60/139  lung]
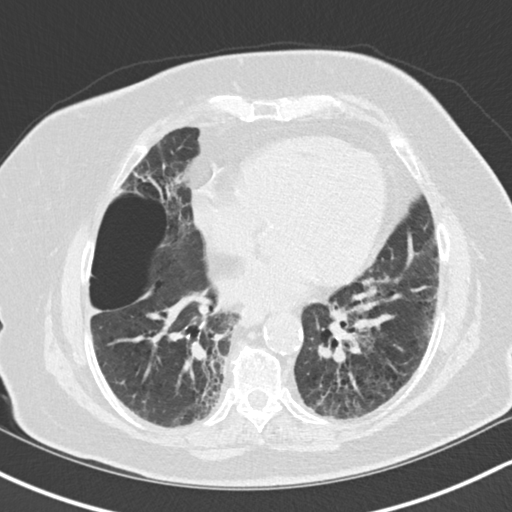
[im 73/139  lung]
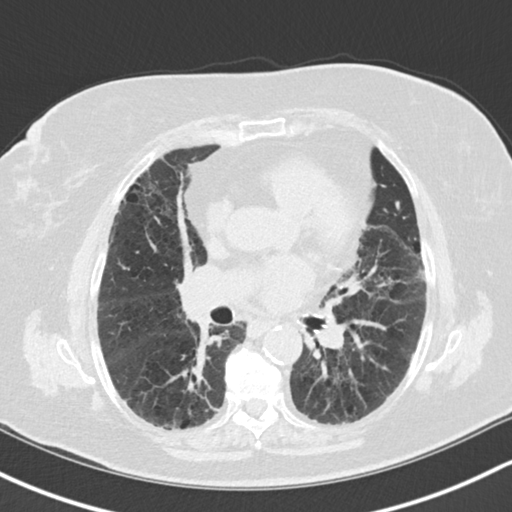
[im 79/139  lung]
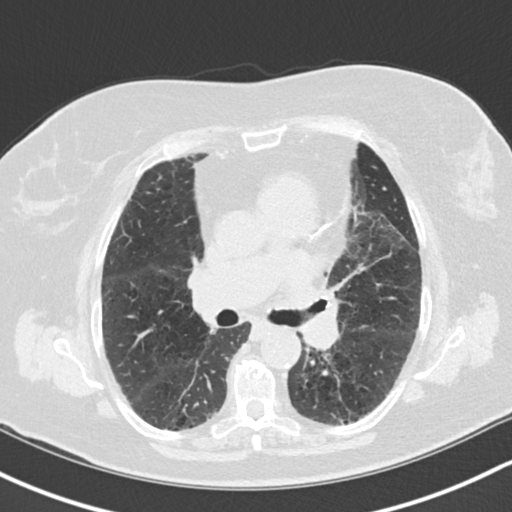
[im 93/139  lung]
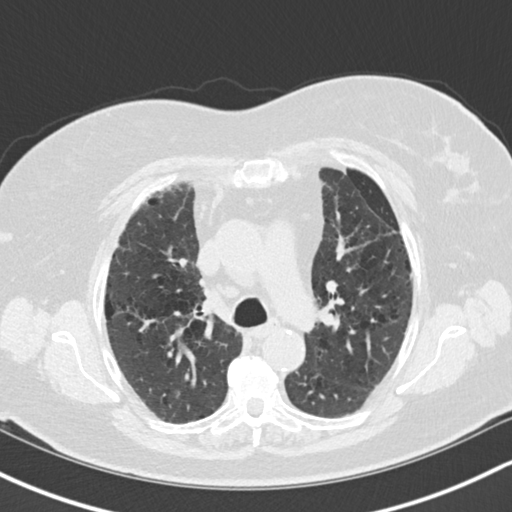
[im 106/139  mediastinal]
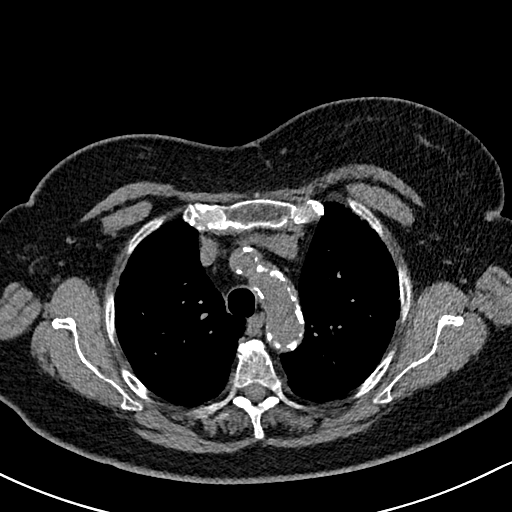
[im 106/139  lung]
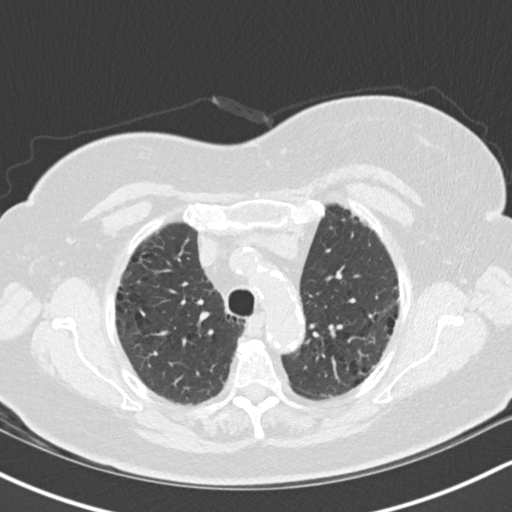
[im 119/139  lung]
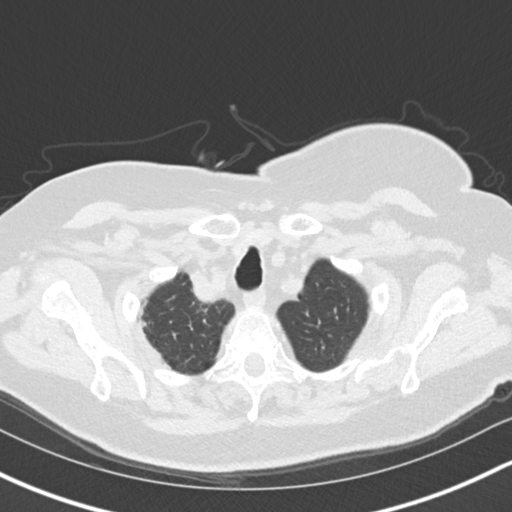
[im 132/139  lung]
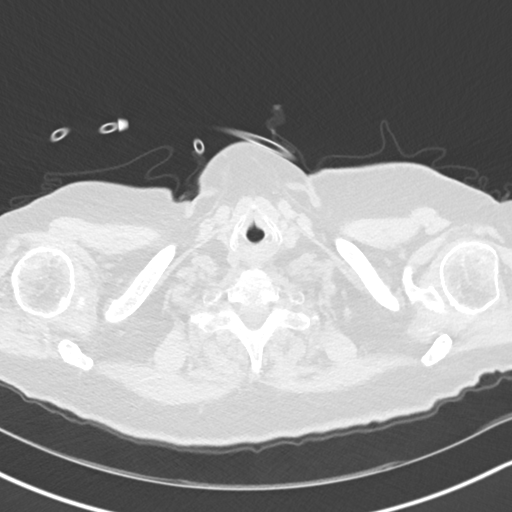

[Series 7: coronal · coronal · 0.54mm/px · 3 of 138 slices shown]
[im 28/138  lung]
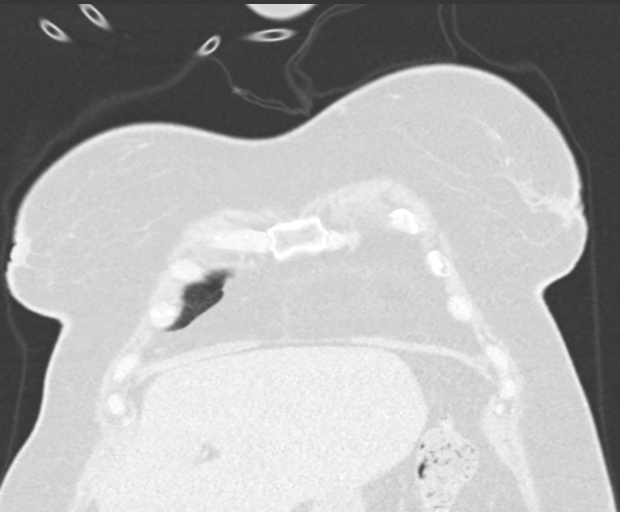
[im 55/138  lung]
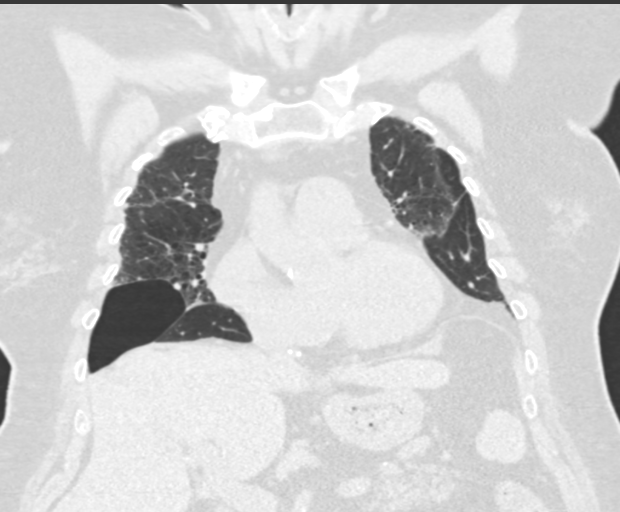
[im 83/138  lung]
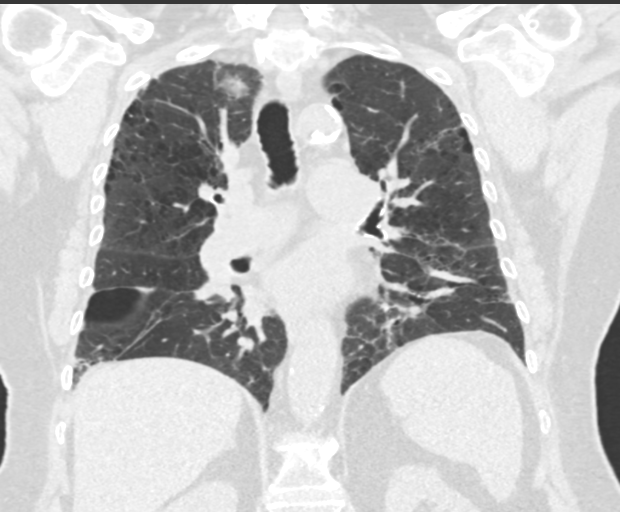

[14 of 36 positions shown; findings below may reference images not displayed]

FINDINGS: Cardiovascular: Borderline mild cardiomegaly. No significant
pericardial effusion/thickening. Three-vessel coronary
atherosclerosis. Atherosclerotic nonaneurysmal thoracic aorta.
Stable top-normal caliber main pulmonary artery (3.0 cm diameter).

Mediastinum/Nodes: No discrete thyroid nodules. Unremarkable
esophagus. No pathologically enlarged axillary, mediastinal or hilar
lymph nodes, noting limited sensitivity for the detection of hilar
adenopathy on this noncontrast study.

Lungs/Pleura: No pneumothorax. No pleural effusion. Moderate
centrilobular and paraseptal emphysema with mild diffuse bronchial
wall thickening. Bullous emphysema in the right middle lobe. Solid 5
mm left lower lobe pulmonary nodule (series 3/image 30) is stable
and considered benign. No acute consolidative airspace disease, lung
masses or additional significant pulmonary nodules. No significant
lobular air trapping or evidence of tracheobronchomalacia on the
expiration sequence. Patchy peribronchovascular and subpleural
reticulation and ground-glass opacity throughout both lungs with
associated mild traction bronchiectasis and architectural
distortion. No clear apicobasilar gradient to these findings. No
frank honeycombing. No convincing interval progression of these
findings.

Upper abdomen: Small hiatal hernia.  Colonic diverticulosis.

Musculoskeletal: No aggressive appearing focal osseous lesions.
Marked thoracic spondylosis.
IMPRESSION: 1. Spectrum of findings compatible with fibrotic interstitial lung
disease with central and peripheral lung involvement, without
honeycombing and without apicobasilar gradient. No interval
progression. Findings favor fibrotic nonspecific interstitial
pneumonia (NSIP). Findings are suggestive of an alternative
diagnosis (not UIP) per consensus guidelines: Diagnosis of
Idiopathic Pulmonary Fibrosis: An Official ATS/ERS/JRS/ALAT Clinical
Practice Guideline. Am J Respir Crit Care Med Vol 198, Langai 5,
ppe33-e[DATE].
2. Stable mild cardiomegaly.  Three-vessel coronary atherosclerosis.
3. Small hiatal hernia.

Aortic Atherosclerosis (ALSE5-12B.B) and Emphysema (ALSE5-XI8.N).

## 2020-06-22 ENCOUNTER — Other Ambulatory Visit: Payer: Self-pay | Admitting: Internal Medicine

## 2020-07-12 DIAGNOSIS — J9621 Acute and chronic respiratory failure with hypoxia: Secondary | ICD-10-CM | POA: Diagnosis not present

## 2020-07-12 DIAGNOSIS — G4733 Obstructive sleep apnea (adult) (pediatric): Secondary | ICD-10-CM | POA: Diagnosis not present

## 2020-07-12 DIAGNOSIS — J449 Chronic obstructive pulmonary disease, unspecified: Secondary | ICD-10-CM | POA: Diagnosis not present

## 2020-07-12 DIAGNOSIS — J8410 Pulmonary fibrosis, unspecified: Secondary | ICD-10-CM | POA: Diagnosis not present

## 2020-07-18 ENCOUNTER — Inpatient Hospital Stay: Admission: RE | Admit: 2020-07-18 | Payer: Medicare HMO | Source: Ambulatory Visit

## 2020-08-01 ENCOUNTER — Inpatient Hospital Stay: Admission: RE | Admit: 2020-08-01 | Payer: Medicare HMO | Source: Ambulatory Visit

## 2020-08-06 DIAGNOSIS — C801 Malignant (primary) neoplasm, unspecified: Secondary | ICD-10-CM

## 2020-08-06 HISTORY — DX: Malignant (primary) neoplasm, unspecified: C80.1

## 2020-08-11 DIAGNOSIS — J9621 Acute and chronic respiratory failure with hypoxia: Secondary | ICD-10-CM | POA: Diagnosis not present

## 2020-08-11 DIAGNOSIS — J8410 Pulmonary fibrosis, unspecified: Secondary | ICD-10-CM | POA: Diagnosis not present

## 2020-08-11 DIAGNOSIS — G4733 Obstructive sleep apnea (adult) (pediatric): Secondary | ICD-10-CM | POA: Diagnosis not present

## 2020-08-11 DIAGNOSIS — J449 Chronic obstructive pulmonary disease, unspecified: Secondary | ICD-10-CM | POA: Diagnosis not present

## 2020-08-14 ENCOUNTER — Ambulatory Visit (INDEPENDENT_AMBULATORY_CARE_PROVIDER_SITE_OTHER)
Admission: RE | Admit: 2020-08-14 | Discharge: 2020-08-14 | Disposition: A | Discharge status: Home / Self Care | Payer: Medicare HMO | Source: Ambulatory Visit | Attending: Pulmonary Disease | Admitting: Pulmonary Disease

## 2020-08-14 ENCOUNTER — Other Ambulatory Visit: Payer: Self-pay

## 2020-08-14 ENCOUNTER — Telehealth: Payer: Self-pay | Admitting: Pulmonary Disease

## 2020-08-14 DIAGNOSIS — J849 Interstitial pulmonary disease, unspecified: Secondary | ICD-10-CM

## 2020-08-14 DIAGNOSIS — R0609 Other forms of dyspnea: Secondary | ICD-10-CM | POA: Diagnosis not present

## 2020-08-14 DIAGNOSIS — J84112 Idiopathic pulmonary fibrosis: Secondary | ICD-10-CM | POA: Diagnosis not present

## 2020-08-14 DIAGNOSIS — R6889 Other general symptoms and signs: Secondary | ICD-10-CM | POA: Diagnosis not present

## 2020-08-14 DIAGNOSIS — J432 Centrilobular emphysema: Secondary | ICD-10-CM | POA: Diagnosis not present

## 2020-08-14 DIAGNOSIS — I2510 Atherosclerotic heart disease of native coronary artery without angina pectoris: Secondary | ICD-10-CM | POA: Diagnosis not present

## 2020-08-14 NOTE — Telephone Encounter (Signed)
Spoke with pt, states today she has experiencing nose bleeds, also having a prod cough with clots of blood. Has had hemoptysis X3 today.  States this happens intermittently.   Denies fever, chest pain/tightness. Pt believes this is coming from mold/poor roofing in her home, and lead paint in home. Pt also notes that she fell X3 weeks ago and landed on her back, and has been having residual pain in her back from this. Has had difficulty ambulating. Pt has not contacted PCP/ortho about this at this time.   Pt has not taken anything to help with s/s. Requesting recs.   [facility]: [facility] [address].   Dr. Vaughan Browner please advise on recs. Thanks.

## 2020-08-14 NOTE — Telephone Encounter (Addendum)
I am not sure if I can manage this over the telephone She needs to go to the ED if she had recent fall and now has backache, hemoptysis and epistaxis

## 2020-08-14 NOTE — Telephone Encounter (Signed)
ATC LVMTCB x 1  

## 2020-08-15 ENCOUNTER — Telehealth: Payer: Self-pay | Admitting: Pulmonary Disease

## 2020-08-15 NOTE — Telephone Encounter (Addendum)
I called both patient and son several times but got voicemail.  I left a voice message to both phones to call back  She has an abnormal CT scan and will need a PET scan and possible biopsy Please refer to interventional radiology for evaluation of liver biopsy.

## 2020-08-15 NOTE — Telephone Encounter (Signed)
ATC patient X2 LMTCB

## 2020-08-15 NOTE — Telephone Encounter (Signed)
Lm for patient.   Will place order to IR after speaking with patient.

## 2020-08-15 NOTE — Telephone Encounter (Signed)
Received call report from [place] with [facility] on patient's HRCT done on 08/14/20. Dr. Vaughan Browner please review the result/impression copied below:  IMPRESSION: 1. Large irregular left lower lobe 8.8 x 7.6 cm solid lung mass, contiguous with left hilar adenopathy and abutting the peripheral pleural surface, new since 02/23/2019 chest CT, highly suspicious for primary bronchogenic carcinoma. 2. Several additional confluent satellite solid nodules in the left lower lobe, new, suspicious for ipsilateral pulmonary metastases. 3. New subcarinal and AP window mediastinal lymphadenopathy, suspicious for nodal metastatic disease. 4. Two new low-attenuation liver masses, partially visualized, suspicious for liver metastases. 5. PET-CT recommended for further staging evaluation. Multi disciplinary thoracic oncology consultation suggested. 6. Spectrum of background findings compatible with underlying fibrotic interstitial lung disease without frank honeycombing and without appreciable apicobasilar gradient. No appreciable interval progression. Findings are indeterminate for UIP per consensus guidelines: Diagnosis of Idiopathic Pulmonary Fibrosis: An Official ATS/ERS/JRS/ALAT Clinical Practice Guideline. [employer], Iss 5, [phone], Dec 07 2016. 7. Stable mild cardiomegaly. Three-vessel coronary atherosclerosis. 8. Aortic Atherosclerosis (ICD10-I70.0) and Emphysema (ICD10-J43.9).  Please advise, thank you.

## 2020-08-16 ENCOUNTER — Ambulatory Visit: Payer: Medicare HMO | Admitting: Internal Medicine

## 2020-08-16 ENCOUNTER — Encounter: Payer: Self-pay | Admitting: *Deleted

## 2020-08-16 NOTE — Telephone Encounter (Signed)
LMTCB and will close per protocol and mail her a letter.

## 2020-08-16 NOTE — Telephone Encounter (Signed)
LMTCB for the pt 

## 2020-08-16 NOTE — Telephone Encounter (Signed)
Letter has been printed and mailed to patient for her to call the office. Will close this encounter as we have been unable to get a hold of patient.

## 2020-08-17 ENCOUNTER — Ambulatory Visit (INDEPENDENT_AMBULATORY_CARE_PROVIDER_SITE_OTHER): Payer: Medicare HMO

## 2020-08-17 ENCOUNTER — Ambulatory Visit (INDEPENDENT_AMBULATORY_CARE_PROVIDER_SITE_OTHER): Payer: Medicare HMO | Admitting: Internal Medicine

## 2020-08-17 ENCOUNTER — Other Ambulatory Visit: Payer: Self-pay

## 2020-08-17 ENCOUNTER — Encounter: Payer: Self-pay | Admitting: Internal Medicine

## 2020-08-17 VITALS — BP 114/60 | HR 56 | Temp 98.0°F | Ht 59.0 in | Wt 148.0 lb

## 2020-08-17 DIAGNOSIS — R739 Hyperglycemia, unspecified: Secondary | ICD-10-CM

## 2020-08-17 DIAGNOSIS — E559 Vitamin D deficiency, unspecified: Secondary | ICD-10-CM

## 2020-08-17 DIAGNOSIS — E039 Hypothyroidism, unspecified: Secondary | ICD-10-CM | POA: Diagnosis not present

## 2020-08-17 DIAGNOSIS — J309 Allergic rhinitis, unspecified: Secondary | ICD-10-CM

## 2020-08-17 DIAGNOSIS — M545 Low back pain, unspecified: Secondary | ICD-10-CM | POA: Diagnosis not present

## 2020-08-17 DIAGNOSIS — M546 Pain in thoracic spine: Secondary | ICD-10-CM | POA: Diagnosis not present

## 2020-08-17 DIAGNOSIS — J9611 Chronic respiratory failure with hypoxia: Secondary | ICD-10-CM

## 2020-08-17 DIAGNOSIS — E538 Deficiency of other specified B group vitamins: Secondary | ICD-10-CM

## 2020-08-17 DIAGNOSIS — Z0001 Encounter for general adult medical examination with abnormal findings: Secondary | ICD-10-CM | POA: Diagnosis not present

## 2020-08-17 DIAGNOSIS — E782 Mixed hyperlipidemia: Secondary | ICD-10-CM

## 2020-08-17 DIAGNOSIS — R040 Epistaxis: Secondary | ICD-10-CM | POA: Diagnosis not present

## 2020-08-17 LAB — HEPATIC FUNCTION PANEL
ALT: 11 U/L (ref 0–35)
AST: 18 U/L (ref 0–37)
Albumin: 4.1 g/dL (ref 3.5–5.2)
Alkaline Phosphatase: 42 U/L (ref 39–117)
Bilirubin, Direct: 0.1 mg/dL (ref 0.0–0.3)
Total Bilirubin: 0.5 mg/dL (ref 0.2–1.2)
Total Protein: 6.7 g/dL (ref 6.0–8.3)

## 2020-08-17 LAB — URINALYSIS, ROUTINE W REFLEX MICROSCOPIC
Bilirubin Urine: NEGATIVE
Hgb urine dipstick: NEGATIVE
Ketones, ur: NEGATIVE
Leukocytes,Ua: NEGATIVE
Nitrite: NEGATIVE
Specific Gravity, Urine: 1.01 (ref 1.000–1.030)
Total Protein, Urine: NEGATIVE
Urine Glucose: NEGATIVE
Urobilinogen, UA: 0.2 (ref 0.0–1.0)
pH: 7.5 (ref 5.0–8.0)

## 2020-08-17 LAB — CBC WITH DIFFERENTIAL/PLATELET
Basophils Absolute: 0 10*3/uL (ref 0.0–0.1)
Basophils Relative: 0.9 % (ref 0.0–3.0)
Eosinophils Absolute: 0.2 10*3/uL (ref 0.0–0.7)
Eosinophils Relative: 3.3 % (ref 0.0–5.0)
HCT: 32.7 % — ABNORMAL LOW (ref 36.0–46.0)
Hemoglobin: 11.1 g/dL — ABNORMAL LOW (ref 12.0–15.0)
Lymphocytes Relative: 21 % (ref 12.0–46.0)
Lymphs Abs: 1.1 10*3/uL (ref 0.7–4.0)
MCHC: 34 g/dL (ref 30.0–36.0)
MCV: 90.6 fl (ref 78.0–100.0)
Monocytes Absolute: 0.5 10*3/uL (ref 0.1–1.0)
Monocytes Relative: 9.9 % (ref 3.0–12.0)
Neutro Abs: 3.3 10*3/uL (ref 1.4–7.7)
Neutrophils Relative %: 64.9 % (ref 43.0–77.0)
Platelets: 200 10*3/uL (ref 150.0–400.0)
RBC: 3.61 Mil/uL — ABNORMAL LOW (ref 3.87–5.11)
RDW: 14.6 % (ref 11.5–15.5)
WBC: 5.1 10*3/uL (ref 4.0–10.5)

## 2020-08-17 LAB — BASIC METABOLIC PANEL
BUN: 16 mg/dL (ref 6–23)
CO2: 30 mEq/L (ref 19–32)
Calcium: 9.4 mg/dL (ref 8.4–10.5)
Chloride: 98 mEq/L (ref 96–112)
Creatinine, Ser: 0.85 mg/dL (ref 0.40–1.20)
GFR: 63.82 mL/min (ref >60.00)
Glucose, Bld: 80 mg/dL (ref 70–99)
Potassium: 3.9 mEq/L (ref 3.5–5.1)
Sodium: 136 mEq/L (ref 135–145)

## 2020-08-17 LAB — LIPID PANEL
Cholesterol: 115 mg/dL (ref 0–200)
HDL: 49.8 mg/dL (ref >39.00)
LDL Cholesterol: 44 mg/dL (ref 0–99)
NonHDL: 64.84
Total CHOL/HDL Ratio: 2
Triglycerides: 102 mg/dL (ref 0.0–149.0)
VLDL: 20.4 mg/dL (ref 0.0–40.0)

## 2020-08-17 LAB — VITAMIN B12: Vitamin B-12: >1550 pg/mL — ABNORMAL HIGH (ref 211–911)

## 2020-08-17 LAB — VITAMIN D 25 HYDROXY (VIT D DEFICIENCY, FRACTURES): VITD: 53.31 ng/mL (ref 30.00–100.00)

## 2020-08-17 LAB — HEMOGLOBIN A1C: Hgb A1c MFr Bld: 5.4 % (ref 4.6–6.5)

## 2020-08-17 LAB — TSH: TSH: 1.05 u[IU]/mL (ref 0.35–4.50)

## 2020-08-17 MED ORDER — AZELASTINE-FLUTICASONE 137-50 MCG/ACT NA SUSP: 1.0000 | Freq: Two times a day (BID) | NASAL | 5 refills | Status: AC

## 2020-08-17 MED ORDER — HYDROCODONE-ACETAMINOPHEN 5-325 MG PO TABS: 1.0000 | ORAL_TABLET | Freq: Four times a day (QID) | ORAL | 0 refills | Status: DC | PRN

## 2020-08-17 MED ORDER — PREDNISONE 10 MG PO TABS
ORAL_TABLET | ORAL | 0 refills | Status: DC
Start: 2020-08-17 — End: 2020-08-29

## 2020-08-17 NOTE — Assessment & Plan Note (Addendum)
Mild to mod, for predpac asd, for dymista asd, to f/u any worsening symptoms or concerns

## 2020-08-17 NOTE — Progress Notes (Signed)
Chief Complaint: wellness and back pain, recurrent nosebleed       HPI:  Dawn Gomez is a 85 y.o. female here with recent mehanical fall trying to get down a few uneven stairs with that did have a rail to the side; was able to grab as she went backwards with the foot slipping out forward, and did not hit the head, but did hit the lower back on hard cement stairs, all on easter Sunday apr 17.  Also has some stairs at home but has not fallen there.  Now with persisent pains severe constant, better to sit quietly, located primarily at the spine level just above the bra line; worse to try reach overhead with the right arm, but can still do It slowly if she needs to.  Also having some work done on the house with some possible lead and mold exposure, but plans to see pulm soon  Is having some recurrent blood from the nose wondering if related, also with allergy symptoms not always controlled with mucinex, and nasacort seems to make the bleeding worse. Tramadol not always working well  Does have several wks ongoing nasal allergy symptoms with clearish congestion, itch and sneezing, without fever, pain, ST, cough, swelling or wheezing.         Wt Readings from Last 3 Encounters:  08/17/20 148 lb (67.1 kg)  05/04/20 161 lb 6.4 oz (73.2 kg)  12/23/19 163 lb 3.2 oz (74 kg)   BP Readings from Last 3 Encounters:  08/17/20 114/60  05/04/20 126/70  12/23/19 (!) 98/54         Past Medical History:  Diagnosis Date  . Abnormal heart rhythm   . Aortic aneurysm ([place])   . Aortic atherosclerosis ([place]) 03/03/2019  . Dyspnea   . Hypothyroid   . OSA (obstructive sleep apnea)    on cpap  . Psoriasis 02/05/2017  . Pulmonary fibrosis ([place])   . SVT (supraventricular tachycardia) ([place])   . Thyroid disease    hypo   Past Surgical History:  Procedure Laterality Date  . ABDOMINAL HYSTERECTOMY    . APPENDECTOMY    . CHOLECYSTECTOMY    . CRANIOTOMY     for aneurysms  . CRANIOTOMY  1980 x 2    aneurysmal clipping  . HERNIA REPAIR    . TOTAL ABDOMINAL HYSTERECTOMY    . TUBAL LIGATION      reports that she quit smoking about 14 years ago. Her smoking use included cigarettes. She has a 100.00 pack-year smoking history. She has never used smokeless tobacco. She reports current alcohol use. She reports that she does not use drugs. family history includes Allergies in her sister; Bone cancer in her father; Breast cancer in her sister; Cancer in her sister; Clotting disorder in her father and sister; Emphysema in her father, mother, and sister; Heart disease in her father and mother; Lung cancer in her father and mother; Prostate cancer in her father; Rheum arthritis in her father, mother, and sister. Allergies  Allergen Reactions  . Lipitor [Atorvastatin] Shortness Of Breath, Diarrhea and Other (See Comments)    Dizzy, pain all over.  . Codeine   . Hydromorphone Hcl     REACTION: dementia  . Levaquin [Levofloxacin In D5w]     Shock per pt but can take cipro  . Morphine   . Oxycodone-Acetaminophen   . Propoxyphene N-Acetaminophen   . Simvastatin    Current Outpatient Medications on File Prior to Visit  Medication Sig Dispense Refill  . acetaminophen (TYLENOL) 500 MG tablet Take 500 mg by mouth every 6 (six) hours as needed.    . Ascorbic Acid (VITAMIN C PO) Take by mouth.    . Blood Glucose Monitoring Suppl (TRUE METRIX METER) w/Device KIT USE AS DIRECTED 1 kit 0  . CALCIUM-MAGNESIUM-ZINC PO Take 1 tablet by mouth daily.    . cholecalciferol (VITAMIN D) 1000 units tablet Take 5,000 Units by mouth daily.    Marland Kitchen diltiazem (CARDIZEM CD) 180 MG 24 hr capsule Take 1 capsule (180 mg total) by mouth daily. 90 capsule 2  . escitalopram (LEXAPRO) 20 MG tablet Take 1 tablet (20 mg total) by mouth at bedtime. 90 tablet 3  . fenofibrate 160 MG tablet Take 1 tablet (160 mg total) by mouth daily. 90 tablet 1  . fluocinonide (LIDEX) 0.05 % external solution APP EXT AA OF SCALP NIGHTLY  3  .  furosemide (LASIX) 80 MG tablet Take 1 tablet (80 mg total) by mouth daily. 90 tablet 3  . guaiFENesin (MUCINEX) 600 MG 12 hr tablet Take 2 tablets (1,200 mg total) by mouth 2 (two) times daily as needed. 60 tablet 2  . ipratropium-albuterol (DUONEB) 0.5-2.5 (3) MG/3ML SOLN Take 3 mLs by nebulization every 4 (four) hours as needed. 360 mL 0  . levothyroxine (SYNTHROID) 100 MCG tablet Take 1 tablet (100 mcg total) by mouth daily before breakfast. 90 tablet 1  . lisinopril (ZESTRIL) 5 MG tablet Take 1 tablet (5 mg total) by mouth daily. 90 tablet 1  . potassium chloride SA (KLOR-CON) 20 MEQ tablet TAKE 1 TABLET EVERY DAY WITH FOOD 90 tablet 3  . rosuvastatin (CRESTOR) 20 MG tablet Take 1 tablet (20 mg total) by mouth daily. 90 tablet 3  . tiZANidine (ZANAFLEX) 2 MG tablet Take 1 tablet (2 mg total) by mouth 3 (three) times daily. 270 tablet 1  . traMADol (ULTRAM) 50 MG tablet Take 1 tablet (50 mg total) by mouth 4 (four) times daily. 360 tablet 1  . traZODone (DESYREL) 50 MG tablet Take 1 tablet (50 mg total) by mouth at bedtime as needed for sleep. 90 tablet 1  . vitamin B-12 (CYANOCOBALAMIN) 1000 MCG tablet Take 500 mcg by mouth daily.    . vitamin C (ASCORBIC ACID) 500 MG tablet Take 500 mg by mouth daily.    . Vitamin D, Ergocalciferol, (DRISDOL) 50000 units CAPS capsule Take 1 capsule (50,000 Units total) by mouth every 7 (seven) days. 12 capsule 0  . vitamin E 400 UNIT capsule Take 200 Units by mouth daily.     No current facility-administered medications on file prior to visit.        ROS:  All others reviewed and negative.  Objective        PE:  BP 114/60 (BP Location: Left Arm, Patient Position: Sitting, Cuff Size: Normal)   Pulse (!) 56   Temp 98 F (36.7 C) (Oral)   Ht 4' 11"  (1.499 m)   Wt 148 lb (67.1 kg)   SpO2 100%   BMI 29.89 kg/m                 Constitutional: Pt appears in NAD               HENT: Head: NCAT.                Right Ear: External ear normal.  Left Ear: External ear normal.                Eyes: . Pupils are equal, round, and reactive to light. Conjunctivae and EOM are normal; Bilat tm's with mild erythema.  Max sinus areas non tender.  Pharynx with mild erythema, no exudate               Nose: without d/c or deformity               Neck: Neck supple. Gross normal ROM               Cardiovascular: Normal rate and regular rhythm.                 Pulmonary/Chest: Effort normal and breath sounds without rales or wheezing.                Abd:  Soft, NT, ND, + BS, no organomegaly               Neurological: Pt is alert. At baseline orientation, motor grossly intact               Skin: Skin is warm. No rashes, no other new lesions, LE edema - none               Psychiatric: Pt behavior is normal without agitation   Micro: none  Cardiac tracings I have personally interpreted today:  none  Pertinent Radiological findings (summarize): none   Lab Results  Component Value Date   WBC 5.1 08/17/2020   HGB 11.1 (L) 08/17/2020   HCT 32.7 (L) 08/17/2020   PLT 200.0 08/17/2020   GLUCOSE 80 08/17/2020   CHOL 115 08/17/2020   TRIG 102.0 08/17/2020   HDL 49.80 08/17/2020   LDLDIRECT 138.0 02/05/2017   LDLCALC 44 08/17/2020   ALT 11 08/17/2020   AST 18 08/17/2020   NA 136 08/17/2020   K 3.9 08/17/2020   CL 98 08/17/2020   CREATININE 0.85 08/17/2020   BUN 16 08/17/2020   CO2 30 08/17/2020   TSH 1.05 08/17/2020   HGBA1C 5.4 08/17/2020   Assessment/Plan:  Latice Waitman is a 85 y.o. White or Caucasian [1] female with  has a past medical history of Abnormal heart rhythm, Aortic aneurysm ([place]), Aortic atherosclerosis ([place]) (03/03/2019), Dyspnea, Hypothyroid, OSA (obstructive sleep apnea), Psoriasis (02/05/2017), Pulmonary fibrosis ([place]), SVT (supraventricular tachycardia) ([place]), and Thyroid disease.  Encounter for well adult exam with abnormal findings Age and sex appropriate education and counseling updated with regular exercise  and diet Referrals for preventative services - none needed Immunizations addressed - none needed Smoking counseling  - none needed Evidence for depression or other mood disorder - none significant Most recent labs reviewed. I have personally reviewed and have noted: 1) the patient's medical and social history 2) The patient's current medications and supplements 3) The patient's height, weight, and BMI have been recorded in the chart   Chronic respiratory failure ([place]) Now on 4L [place] continueous( 5 with exertion)  Vitamin D deficiency Last vitamin D Lab Results  Component Value Date   VD25OH 53.31 08/17/2020   Stable, cont oral replacement  Hypothyroidism Lab Results  Component Value Date   TSH 1.05 08/17/2020   Stable, pt to continue levothyroxine   Hyperlipidemia Lab Results  Component Value Date   LDLCALC 44 08/17/2020   Stable, pt to continue current statin crestor 20   Hyperglycemia Lab Results  Component Value Date   HGBA1C  5.4 08/17/2020   Stable, pt to continue current medical treatment  - diet   Back pain Post fall, non mri candidate, for vicodin prn, also plain xray today t spine  Frequent nosebleeds For ENT referral  Allergic rhinitis Mild to mod, for predpac asd, for dymista asd, to f/u any worsening symptoms or concerns  Followup: Return in about 4 months (around 12/18/2020).  Cathlean Cower, MD 08/17/2020 10:29 PM [facility]

## 2020-08-17 NOTE — Assessment & Plan Note (Signed)
For ENT referral 

## 2020-08-17 NOTE — Assessment & Plan Note (Signed)
Last vitamin D Lab Results  Component Value Date   VD25OH 53.31 08/17/2020   Stable, cont oral replacement

## 2020-08-17 NOTE — Patient Instructions (Signed)
Please take all new medication as prescribed - the hydrocodone pain medication as needed (but HOLD the tramadol when you are taking the hydrocodone)  Please take all new medication as prescribed - the prednisone, and the Dymista nasal spray for allergies  Please continue all other medications as before, and refills have been done if requested.  Please have the pharmacy call with any other refills you may need.  Please continue your efforts at being more active, low cholesterol diet, and weight control.  You are otherwise up to date with prevention measures today.  Please keep your appointments with your specialists as you may have planned  You will be contacted regarding the referral for: ENT for the nosebleeds  Please go to the XRAY Department in the first floor for the x-ray testing  Please go to the LAB at the blood drawing area for the tests to be done  You will be contacted by phone if any changes need to be made immediately.  Otherwise, you will receive a letter about your results with an explanation, but please check with MyChart first.  Please remember to sign up for MyChart if you have not done so, as this will be important to you in the future with finding out test results, communicating by private email, and scheduling acute appointments online when needed.  Please make an Appointment to return in 4 months

## 2020-08-17 NOTE — Assessment & Plan Note (Signed)
Lab Results  Component Value Date   HGBA1C 5.4 08/17/2020   Stable, pt to continue current medical treatment  - diet

## 2020-08-17 NOTE — Assessment & Plan Note (Signed)

## 2020-08-17 NOTE — Assessment & Plan Note (Signed)
Lab Results  Component Value Date   TSH 1.05 08/17/2020   Stable, pt to continue levothyroxine

## 2020-08-17 NOTE — Assessment & Plan Note (Signed)
Lab Results  Component Value Date   LDLCALC 44 08/17/2020   Stable, pt to continue current statin crestor 20

## 2020-08-17 NOTE — Assessment & Plan Note (Signed)
Now on 4L [place] continueous( 5 with exertion)

## 2020-08-17 NOTE — Assessment & Plan Note (Signed)
Post fall, non mri candidate, for vicodin prn, also plain xray today t spine

## 2020-08-20 ENCOUNTER — Encounter: Payer: Self-pay | Admitting: Internal Medicine

## 2020-08-22 ENCOUNTER — Encounter (HOSPITAL_COMMUNITY): Payer: Self-pay

## 2020-08-22 ENCOUNTER — Telehealth: Payer: Self-pay | Admitting: Internal Medicine

## 2020-08-22 ENCOUNTER — Other Ambulatory Visit: Payer: Self-pay | Admitting: *Deleted

## 2020-08-22 ENCOUNTER — Telehealth (HOSPITAL_COMMUNITY): Payer: Self-pay

## 2020-08-22 ENCOUNTER — Telehealth: Payer: Self-pay | Admitting: Pulmonary Disease

## 2020-08-22 DIAGNOSIS — R160 Hepatomegaly, not elsewhere classified: Secondary | ICD-10-CM

## 2020-08-22 DIAGNOSIS — R911 Solitary pulmonary nodule: Secondary | ICD-10-CM

## 2020-08-22 DIAGNOSIS — R918 Other nonspecific abnormal finding of lung field: Secondary | ICD-10-CM

## 2020-08-22 NOTE — Telephone Encounter (Signed)
I called the patient's several times but she did not answer I was finally able to get in touch with son Chrissie Noa and informed him of that the CT scan findings concerning for malignancy  He will let his mother know that we are putting in an order for a PET scan and IR guided biopsy of the liver and ensure that she follows up Make a referral to oncology as well.  Marshell Garfinkel MD [facility] 08/22/2020, 8:31 AM

## 2020-08-22 NOTE — Telephone Encounter (Signed)
   Please call patient to discuss imaging results 

## 2020-08-22 NOTE — Progress Notes (Unsigned)
[place] Female, 85 y.o., 10-12-1935  MRN:  [id number] Phone:  [phone] (M)       PCP:  Biagio Borg, MD Coverage:  [facility] Medicare/[employer] Medicare Hmo  Next Appt With Radiology ([facility]-US 2) 08/29/2020 at 1:00 PM           RE: Biopsy Received: Today  Message Details  Arne Cleveland, MD  Ernestene Mention -- lung mass and new liver lesions   [place] core liver lesion r/o met    [facility]    Previous Messages  ----- Message -----  From: Lenore Cordia  Sent: 08/22/2020 11:03 AM EDT  To: Ir Procedure Requests  Subject: Biopsy                      Procedure Requested: US Biopsy (Liver)    Reason for Procedure: ?malignancy    Provider Requesting: Marshell Garfinkel  Provider Telephone: [phone]    Other Info:

## 2020-08-22 NOTE — Telephone Encounter (Signed)
Called and spoke with pt and her son and she is aware of the referrals placed per PM to get the PET scan and the biopsy set up. She was advised if she has any questions or concerns, to call back.

## 2020-08-22 NOTE — Telephone Encounter (Signed)
Orders have been placed per PM.  Nothing further is needed.

## 2020-08-23 ENCOUNTER — Telehealth: Payer: Self-pay | Admitting: *Deleted

## 2020-08-23 DIAGNOSIS — R222 Localized swelling, mass and lump, trunk: Secondary | ICD-10-CM

## 2020-08-23 NOTE — Telephone Encounter (Signed)
I called Dawn Gomez again but was unable to reach nor leave vm message.

## 2020-08-23 NOTE — Telephone Encounter (Signed)
Tried calling patient, mailbox is full.

## 2020-08-23 NOTE — Telephone Encounter (Signed)
I received a call back from Ms. Borum. I updated her on appt. She verbalized understanding.

## 2020-08-23 NOTE — Telephone Encounter (Signed)
I called unable to reach or leave vm message.

## 2020-08-23 NOTE — Telephone Encounter (Signed)
I received referral on Ms. Alcorn.  I called left message to call me with name and phone number

## 2020-08-24 NOTE — Telephone Encounter (Signed)
Left message for patient to call me back. 

## 2020-08-25 ENCOUNTER — Telehealth: Payer: Self-pay

## 2020-08-25 NOTE — Telephone Encounter (Signed)
Results reviewed in detail with patient and patient verbalizes understanding.

## 2020-08-25 NOTE — Telephone Encounter (Signed)
PA KEY :  [id number]   Azelastine-Fluticasone 137-50MCG/ACT suspension  Prior Auth was denied.

## 2020-08-28 ENCOUNTER — Telehealth: Payer: Self-pay | Admitting: Pulmonary Disease

## 2020-08-28 ENCOUNTER — Other Ambulatory Visit: Payer: Self-pay | Admitting: Radiology

## 2020-08-28 NOTE — Telephone Encounter (Signed)
I have called and LM on VM for the pt to call us back.  

## 2020-08-29 ENCOUNTER — Ambulatory Visit (HOSPITAL_COMMUNITY)
Admission: RE | Admit: 2020-08-29 | Discharge: 2020-08-29 | Disposition: A | Discharge status: Home / Self Care | Payer: Medicare HMO | Source: Ambulatory Visit | Attending: Pulmonary Disease | Admitting: Pulmonary Disease

## 2020-08-29 ENCOUNTER — Encounter (HOSPITAL_COMMUNITY): Payer: Self-pay

## 2020-08-29 ENCOUNTER — Other Ambulatory Visit: Payer: Self-pay

## 2020-08-29 DIAGNOSIS — K7689 Other specified diseases of liver: Secondary | ICD-10-CM | POA: Diagnosis not present

## 2020-08-29 DIAGNOSIS — Z803 Family history of malignant neoplasm of breast: Secondary | ICD-10-CM | POA: Insufficient documentation

## 2020-08-29 DIAGNOSIS — C229 Malignant neoplasm of liver, not specified as primary or secondary: Secondary | ICD-10-CM | POA: Diagnosis not present

## 2020-08-29 DIAGNOSIS — I700 Atherosclerosis of aorta: Secondary | ICD-10-CM | POA: Insufficient documentation

## 2020-08-29 DIAGNOSIS — C787 Secondary malignant neoplasm of liver and intrahepatic bile duct: Secondary | ICD-10-CM | POA: Diagnosis not present

## 2020-08-29 DIAGNOSIS — Z808 Family history of malignant neoplasm of other organs or systems: Secondary | ICD-10-CM | POA: Diagnosis not present

## 2020-08-29 DIAGNOSIS — Z87891 Personal history of nicotine dependence: Secondary | ICD-10-CM | POA: Diagnosis not present

## 2020-08-29 DIAGNOSIS — R160 Hepatomegaly, not elsewhere classified: Secondary | ICD-10-CM

## 2020-08-29 DIAGNOSIS — J849 Interstitial pulmonary disease, unspecified: Secondary | ICD-10-CM | POA: Insufficient documentation

## 2020-08-29 DIAGNOSIS — R918 Other nonspecific abnormal finding of lung field: Secondary | ICD-10-CM | POA: Insufficient documentation

## 2020-08-29 DIAGNOSIS — C801 Malignant (primary) neoplasm, unspecified: Secondary | ICD-10-CM | POA: Insufficient documentation

## 2020-08-29 DIAGNOSIS — Z79899 Other long term (current) drug therapy: Secondary | ICD-10-CM | POA: Insufficient documentation

## 2020-08-29 DIAGNOSIS — Z801 Family history of malignant neoplasm of trachea, bronchus and lung: Secondary | ICD-10-CM | POA: Insufficient documentation

## 2020-08-29 LAB — CBC
HCT: 35.3 % — ABNORMAL LOW (ref 36.0–46.0)
Hemoglobin: 11.2 g/dL — ABNORMAL LOW (ref 12.0–15.0)
MCH: 29.7 pg (ref 26.0–34.0)
MCHC: 31.7 g/dL (ref 30.0–36.0)
MCV: 93.6 fL (ref 80.0–100.0)
Platelets: 245 10*3/uL (ref 150–400)
RBC: 3.77 MIL/uL — ABNORMAL LOW (ref 3.87–5.11)
RDW: 14 % (ref 11.5–15.5)
WBC: 7.5 10*3/uL (ref 4.0–10.5)
nRBC: 0 % (ref 0.0–0.2)

## 2020-08-29 LAB — PROTIME-INR
INR: 1.1 (ref 0.8–1.2)
Prothrombin Time: 13.8 seconds (ref 11.4–15.2)

## 2020-08-29 MED ORDER — MIDAZOLAM HCL 2 MG/2ML IJ SOLN
INTRAMUSCULAR | Status: AC | PRN
  Administered 2020-08-29: 1 mg via INTRAVENOUS
  Administered 2020-08-29: 0.5 mg via INTRAVENOUS

## 2020-08-29 MED ORDER — FENTANYL CITRATE (PF) 100 MCG/2ML IJ SOLN
INTRAMUSCULAR | Status: AC
  Filled 2020-08-29: qty 2

## 2020-08-29 MED ORDER — FENTANYL CITRATE (PF) 100 MCG/2ML IJ SOLN
INTRAMUSCULAR | Status: AC | PRN
  Administered 2020-08-29: 25 ug via INTRAVENOUS
  Administered 2020-08-29: 50 ug via INTRAVENOUS

## 2020-08-29 MED ORDER — GELATIN ABSORBABLE 12-7 MM EX MISC
CUTANEOUS | Status: AC
  Filled 2020-08-29: qty 1

## 2020-08-29 MED ORDER — MIDAZOLAM HCL 2 MG/2ML IJ SOLN
INTRAMUSCULAR | Status: AC
  Filled 2020-08-29: qty 2

## 2020-08-29 MED ORDER — LIDOCAINE HCL 1 % IJ SOLN
INTRAMUSCULAR | Status: AC
  Filled 2020-08-29: qty 20

## 2020-08-29 MED ORDER — SODIUM CHLORIDE 0.9 % IV SOLN: INTRAVENOUS | Status: DC

## 2020-08-29 NOTE — Discharge Instructions (Signed)
Please call Interventional Radiology clinic [phone] with any questions or concerns.  You may remove your dressing and shower tomorrow.   Liver Biopsy, Care After These instructions give you information on caring for yourself after your procedure. Your doctor may also give you more specific instructions. Call your doctor if you have any problems or questions after your procedure. What can I expect after the procedure? After the procedure, it is common to have:  Pain and soreness where the biopsy was done.  Bruising around the area where the biopsy was done.  Sleepiness and be tired for a few days. Follow these instructions at home: Medicines  Take over-the-counter and prescription medicines only as told by your doctor.  If you were prescribed an antibiotic medicine, take it as told by your doctor. Do not stop taking the antibiotic even if you start to feel better.  Do not take medicines such as aspirin and ibuprofen. These medicines can thin your blood. Do not take these medicines unless your doctor tells you to take them.  If you are taking prescription pain medicine, take actions to prevent or treat constipation. Your doctor may recommend that you: ? Drink enough fluid to keep your pee (urine) clear or pale yellow. ? Take over-the-counter or prescription medicines. ? Eat foods that are high in fiber, such as fresh fruits and vegetables, whole grains, and beans. ? Limit foods that are high in fat and processed sugars, such as fried and sweet foods. Caring for your cut  Follow instructions from your doctor about how to take care of your cuts from surgery (incisions). Make sure you: ? Wash your hands with soap and water before you change your bandage (dressing). If you cannot use soap and water, use hand sanitizer. ? Change your bandage as told by your doctor. ? Leave stitches (sutures), skin glue, or skin tape (adhesive) strips in place. They may need to stay in place for 2  weeks or longer. If tape strips get loose and curl up, you may trim the loose edges. Do not remove tape strips completely unless your doctor says it is okay.  Check your cuts every day for signs of infection. Check for: ? Redness, swelling, or more pain. ? Fluid or blood. ? Pus or a bad smell. ? Warmth.  Do not take baths, swim, or use a hot tub until your doctor says it is okay to do so. Activity  Rest at home for 1-2 days or as told by your doctor. ? Avoid sitting for a long time without moving. Get up to take short walks every 1-2 hours.  Return to your normal activities as told by your doctor. Ask what activities are safe for you.  Do not do these things in the first 24 hours: ? Drive. ? Use machinery. ? Take a bath or shower.  Do not lift more than 10 pounds (4.5 kg) or play contact sports for the first 2 weeks.   General instructions  Do not drink alcohol in the first week after the procedure.  Have someone stay with you for at least 24 hours after the procedure.  Get your test results. Ask your doctor or the department that is doing the test: ? When will my results be ready? ? How will I get my results? ? What are my treatment options? ? What other tests do I need? ? What are my next steps?  Keep all follow-up visits as told by your doctor. This is important.   Contact  a doctor if:  A cut bleeds and leaves more than just a small spot of blood.  A cut is red, puffs up (swells), or hurts more than before.  Fluid or something else comes from a cut.  A cut smells bad.  You have a fever or chills. Get help right away if:  You have swelling, bloating, or pain in your belly (abdomen).  You get dizzy or faint.  You have a rash.  You feel sick to your stomach (nauseous) or throw up (vomit).  You have trouble breathing, feel short of breath, or feel faint.  Your chest hurts.  You have problems talking or seeing.  You have trouble with your balance or moving  your arms or legs. Summary  After the procedure, it is common to have pain, soreness, bruising, and tiredness.  Your doctor will tell you how to take care of yourself at home. Change your bandage, take your medicines, and limit your activities as told by your doctor.  Call your doctor if you have symptoms of infection. Get help right away if your belly swells, your cut bleeds a lot, or you have trouble talking or breathing. This information is not intended to replace advice given to you by your health care provider. Make sure you discuss any questions you have with your health care provider. Document Revised: 04/03/2017 Document Reviewed: 04/04/2017 Elsevier Patient Education  2021 [place].   Moderate Conscious Sedation, Adult, Care After This sheet gives you information about how to care for yourself after your procedure. Your health care provider may also give you more specific instructions. If you have problems or questions, contact your health care provider. What can I expect after the procedure? After the procedure, it is common to have:  Sleepiness for several hours.  Impaired judgment for several hours.  Difficulty with balance.  Vomiting if you eat too soon. Follow these instructions at home: For the time period you were told by your health care provider:  Rest.  Do not participate in activities where you could fall or become injured.  Do not drive or use machinery.  Do not drink alcohol.  Do not take sleeping pills or medicines that cause drowsiness.  Do not make important decisions or sign legal documents.  Do not take care of children on your own.      Eating and drinking  Follow the diet recommended by your health care provider.  Drink enough fluid to keep your urine pale yellow.  If you vomit: ? Drink water, juice, or soup when you can drink without vomiting. ? Make sure you have little or no nausea before eating solid foods.   General  instructions  Take over-the-counter and prescription medicines only as told by your health care provider.  Have a responsible adult stay with you for the time you are told. It is important to have someone help care for you until you are awake and alert.  Do not smoke.  Keep all follow-up visits as told by your health care provider. This is important. Contact a health care provider if:  You are still sleepy or having trouble with balance after 24 hours.  You feel light-headed.  You keep feeling nauseous or you keep vomiting.  You develop a rash.  You have a fever.  You have redness or swelling around the IV site. Get help right away if:  You have trouble breathing.  You have new-onset confusion at home. Summary  After the procedure, it is  common to feel sleepy, have impaired judgment, or feel nauseous if you eat too soon.  Rest after you get home. Know the things you should not do after the procedure.  Follow the diet recommended by your health care provider and drink enough fluid to keep your urine pale yellow.  Get help right away if you have trouble breathing or new-onset confusion at home. This information is not intended to replace advice given to you by your health care provider. Make sure you discuss any questions you have with your health care provider. Document Revised: 07/23/2019 Document Reviewed: 02/18/2019 Elsevier Patient Education  2021 [employer].

## 2020-08-29 NOTE — Procedures (Signed)
Interventional Radiology Procedure:   Indications: Lung mass and liver lesions  Procedure: US guided liver lesion biopsy  Findings: Multiple liver lesions.  Right hepatic lesion was biopsied and gelfoam slurry was injected.   Complications: None     EBL: less than 10 ml  Plan: Bedrest 3 hours   Nechuma Boven R. Anselm Pancoast, MD  Pager: [phone]

## 2020-08-29 NOTE — H&P (Signed)
Chief Complaint: Patient was seen in consultation today for liver bx at the request of Mannam,Praveen  Referring Physician(s): Mannam,Praveen  Supervising Physician: Richarda Overlie  Patient Status: [facility] - Out-pt  History of Present Illness: Dawn Gomez is a 85 y.o. female found to have large left lung mass with liver lesions concerning for metastatic process. She is referred for biopsy of liver lesion. PMHx, meds, labs, imaging, allergies reviewed. Feels well, no recent fevers, chills, illness. Has been NPO today as directed.   Past Medical History:  Diagnosis Date  . Abnormal heart rhythm   . Aortic aneurysm (HCC)   . Aortic atherosclerosis (HCC) 03/03/2019  . Dyspnea   . Hypothyroid   . OSA (obstructive sleep apnea)    on cpap  . Psoriasis 02/05/2017  . Pulmonary fibrosis (HCC)   . SVT (supraventricular tachycardia) (HCC)   . Thyroid disease    hypo    Past Surgical History:  Procedure Laterality Date  . ABDOMINAL HYSTERECTOMY    . APPENDECTOMY    . CHOLECYSTECTOMY    . CRANIOTOMY     for aneurysms  . CRANIOTOMY  1980 x 2   aneurysmal clipping  . HERNIA REPAIR    . TOTAL ABDOMINAL HYSTERECTOMY    . TUBAL LIGATION      Allergies: Lipitor [atorvastatin], Codeine, Hydromorphone hcl, Levaquin [levofloxacin in d5w], Morphine, Oxycodone-acetaminophen, Propoxyphene n-acetaminophen, and Simvastatin  Medications: Prior to Admission medications   Medication Sig Start Date End Date Taking? Authorizing Provider  acetaminophen (TYLENOL) 500 MG tablet Take 500 mg by mouth every 6 (six) hours as needed.   Yes [provider]  Ascorbic Acid (VITAMIN C PO) Take by mouth.   Yes [provider]  Azelastine-Fluticasone 137-50 MCG/ACT SUSP Place 1 spray into the nose every 12 (twelve) hours. 08/17/20  Yes Corwin Levins, MD  CALCIUM-MAGNESIUM-ZINC PO Take 1 tablet by mouth daily.   Yes [provider]  cholecalciferol (VITAMIN D) 1000 units  tablet Take 5,000 Units by mouth daily.   Yes [provider]  diltiazem (CARDIZEM CD) 180 MG 24 hr capsule Take 1 capsule (180 mg total) by mouth daily. 04/13/20  Yes Corwin Levins, MD  escitalopram (LEXAPRO) 20 MG tablet Take 1 tablet (20 mg total) by mouth at bedtime. 04/13/20  Yes Corwin Levins, MD  fenofibrate 160 MG tablet Take 1 tablet (160 mg total) by mouth daily. 04/13/20  Yes Corwin Levins, MD  fluocinonide (LIDEX) 0.05 % external solution APP EXT AA OF SCALP NIGHTLY 11/07/17  Yes [provider]  furosemide (LASIX) 80 MG tablet Take 1 tablet (80 mg total) by mouth daily. 04/13/20  Yes Corwin Levins, MD  guaiFENesin (MUCINEX) 600 MG 12 hr tablet Take 2 tablets (1,200 mg total) by mouth 2 (two) times daily as needed. 02/08/20  Yes Corwin Levins, MD  HYDROcodone-acetaminophen (NORCO/VICODIN) 5-325 MG tablet Take 1 tablet by mouth every 6 (six) hours as needed. 08/17/20  Yes Corwin Levins, MD  ipratropium-albuterol (DUONEB) 0.5-2.5 (3) MG/3ML SOLN Take 3 mLs by nebulization every 4 (four) hours as needed. 08/10/16  Yes Zannie Cove, MD  levothyroxine (SYNTHROID) 100 MCG tablet Take 1 tablet (100 mcg total) by mouth daily before breakfast. 04/13/20  Yes Corwin Levins, MD  lisinopril (ZESTRIL) 5 MG tablet Take 1 tablet (5 mg total) by mouth daily. 04/13/20  Yes Corwin Levins, MD  potassium chloride SA (KLOR-CON) 20 MEQ tablet TAKE 1 TABLET EVERY DAY WITH FOOD  04/13/20  Yes Biagio Borg, MD  rosuvastatin (CRESTOR) 20 MG tablet Take 1 tablet (20 mg total) by mouth daily. 04/13/20  Yes Biagio Borg, MD  tiZANidine (ZANAFLEX) 2 MG tablet Take 1 tablet (2 mg total) by mouth 3 (three) times daily. 04/13/20  Yes Biagio Borg, MD  traMADol (ULTRAM) 50 MG tablet Take 1 tablet (50 mg total) by mouth 4 (four) times daily. 04/13/20  Yes Biagio Borg, MD  traZODone (DESYREL) 50 MG tablet Take 1 tablet (50 mg total) by mouth at bedtime as needed for sleep. 04/13/20  Yes Biagio Borg, MD  vitamin C (ASCORBIC  ACID) 500 MG tablet Take 500 mg by mouth daily.   Yes [provider]  Vitamin D, Ergocalciferol, (DRISDOL) 50000 units CAPS capsule Take 1 capsule (50,000 Units total) by mouth every 7 (seven) days. 04/10/17  Yes Biagio Borg, MD  vitamin E 400 UNIT capsule Take 200 Units by mouth daily.   Yes [provider]  Blood Glucose Monitoring Suppl (TRUE METRIX METER) w/Device KIT USE AS DIRECTED 06/22/20   Biagio Borg, MD  predniSONE (DELTASONE) 10 MG tablet 3 tabs by mouth per day for 3 days,2tabs per day for 3 days,1tab per day for 3 days 08/17/20   Biagio Borg, MD  vitamin B-12 (CYANOCOBALAMIN) 1000 MCG tablet Take 500 mcg by mouth daily.    [provider]     Family History  Problem Relation Age of Onset  . Emphysema Father   . Heart disease Father   . Clotting disorder Father   . Rheum arthritis Father   . Lung cancer Father   . Prostate cancer Father   . Bone cancer Father   . Emphysema Sister   . Emphysema Mother   . Heart disease Mother   . Rheum arthritis Mother   . Lung cancer Mother   . Allergies Sister   . Clotting disorder Sister   . Rheum arthritis Sister   . Breast cancer Sister   . Cancer Sister        ureter    Social History   Socioeconomic History  . Marital status: Single    Spouse name: Not on file  . Number of children: 1  . Years of education: 48  . Highest education level: Not on file  Occupational History  . Occupation: retired Therapist, sports  . Occupation: retired  Tobacco Use  . Smoking status: Former Smoker    Packs/day: 2.00    Years: 50.00    Pack years: 100.00    Types: Cigarettes    Quit date: 2008    Years since quitting: 14.4  . Smokeless tobacco: Never Used  Substance and Sexual Activity  . Alcohol use: Yes    Comment: "drank back in her younger wilder days"  . Drug use: No  . Sexual activity: Not on file  Other Topics Concern  . Not on file  Social History Narrative   ** Merged History Encounter **        Social Determinants of Health   Financial Resource Strain: Not on file  Food Insecurity: Not on file  Transportation Needs: Not on file  Physical Activity: Not on file  Stress: Not on file  Social Connections: Not on file     Review of Systems: A 12 point ROS discussed and pertinent positives are indicated in the HPI above.  All other systems are negative.  Review of Systems  Vital Signs: Ht 4'  11" (1.499 m)   Wt 65.3 kg   BMI 29.08 kg/m   Physical Exam Constitutional:      Appearance: Normal appearance. She is not ill-appearing.  HENT:     Mouth/Throat:     Mouth: Mucous membranes are moist.     Pharynx: Oropharynx is clear.  Cardiovascular:     Rate and Rhythm: Normal rate and regular rhythm.     Heart sounds: Normal heart sounds.  Pulmonary:     Effort: Pulmonary effort is normal. No respiratory distress.     Breath sounds: Normal breath sounds.  Abdominal:     General: Abdomen is flat. There is no distension.     Palpations: Abdomen is soft. There is no mass.     Tenderness: There is no abdominal tenderness.  Skin:    General: Skin is warm and dry.  Neurological:     General: No focal deficit present.     Mental Status: She is alert and oriented to person, place, and time.  Psychiatric:        Mood and Affect: Mood normal.        Thought Content: Thought content normal.        Judgment: Judgment normal.     Imaging: DG Thoracic Spine 2 View  Result Date: 08/19/2020 CLINICAL DATA:  Pain near bra line for 2 weeks after a fall backwards onto cement stairs, mid to low back pain for 2 weeks EXAM: THORACIC SPINE 2 VIEWS COMPARISON:  02/05/2017 FINDINGS: Osseous demineralization. Multilevel disc space narrowing and endplate spur formation. Vertebral body heights maintained. No fracture, subluxation, or bone destruction. Atherosclerotic calcifications aorta. Extensive chronic interstitial lung disease/fibrosis. IMPRESSION: Osseous demineralization with multilevel  degenerative disc disease changes thoracic spine. No acute osseous abnormalities. Chronic interstitial lung disease/pulmonary fibrosis, progressive since 2018. Aortic Atherosclerosis (ICD10-I70.0). Electronically Signed   By: Lavonia Dana M.D.   On: 08/19/2020 13:06   DG Lumbar Spine Complete  Result Date: 08/19/2020 CLINICAL DATA:  Mid to lower back pain for 2 weeks post fall backwards onto cement stairs EXAM: LUMBAR SPINE - COMPLETE 4+ VIEW COMPARISON:  12/23/2019 FINDINGS: Five non-rib-bearing lumbar vertebra. Multilevel facet degenerative changes. Vertebral body heights maintained. Scattered disc space narrowing with tiny endplate spurs at E07-H2. No fracture, subluxation, or bone destruction. No spondylolysis. SI joints preserved. Extensive atherosclerotic calcifications aorta and iliac arteries. IMPRESSION: Osseous demineralization with degenerative disc and facet disease changes of thoracolumbar spine. No acute abnormalities. Electronically Signed   By: Lavonia Dana M.D.   On: 08/19/2020 13:08   CT Chest High Resolution  Result Date: 08/14/2020 CLINICAL DATA:  Chronic dyspnea with exertion, on oxygen therapy. Follow-up interstitial lung disease. EXAM: CT CHEST WITHOUT CONTRAST TECHNIQUE: Multidetector CT imaging of the chest was performed following the standard protocol without intravenous contrast. High resolution imaging of the lungs, as well as inspiratory and expiratory imaging, was performed. COMPARISON:  02/23/2019 high-resolution chest CT. FINDINGS: Cardiovascular: Stable mild cardiomegaly. No significant pericardial effusion/thickening. Three-vessel coronary atherosclerosis. Atherosclerotic nonaneurysmal thoracic aorta. Top-normal caliber main pulmonary artery (3.1 cm diameter). Mediastinum/Nodes: No discrete thyroid nodules. Unremarkable esophagus. No axillary adenopathy. Newly enlarged 1.6 cm short axis diameter subcarinal node (series 2/image 59). Newly enlarged AP window nodes up to 1.3 cm  (series 2/image 46). No discrete right hilar adenopathy on these noncontrast images. Lungs/Pleura: No pneumothorax. No pleural effusion. Severe centrilobular and paraseptal emphysema with diffuse bronchial wall thickening. Large irregular left lower lobe 8.8 x 7.6 cm solid lung mass (series 3/image 77),  contiguous with left hilar adenopathy and abutting the peripheral pleural surface, new since 02/23/2019 chest CT. Several additional confluent satellite solid nodules in the left lower lobe, for example measuring 1.2 cm (series 3/image 91), new. Apical left upper lobe 0.5 cm sub solid nodule (series 3/image 31), stable. No significant right pulmonary nodules. Background of patchy peribronchovascular and subpleural reticulation and ground-glass opacity throughout both lungs with associated mild-to-moderate traction bronchiectasis and architectural distortion. No frank honeycombing. No clear apicobasilar gradient to these findings. These background findings have not substantially changed. No significant lobular air trapping or evidence of tracheobronchomalacia on the expiration sequence. Upper abdomen: Partially visualized hypodense 2.7 cm inferior right liver mass (series 2/image 133) and hypodense 1.7 cm inferior segment 4B left liver mass (series 2/image 131), new. Musculoskeletal: No aggressive appearing focal osseous lesions. Marked thoracic spondylosis. IMPRESSION: 1. Large irregular left lower lobe 8.8 x 7.6 cm solid lung mass, contiguous with left hilar adenopathy and abutting the peripheral pleural surface, new since 02/23/2019 chest CT, highly suspicious for primary bronchogenic carcinoma. 2. Several additional confluent satellite solid nodules in the left lower lobe, new, suspicious for ipsilateral pulmonary metastases. 3. New subcarinal and AP window mediastinal lymphadenopathy, suspicious for nodal metastatic disease. 4. Two new low-attenuation liver masses, partially visualized, suspicious for liver  metastases. 5. PET-CT recommended for further staging evaluation. Multi disciplinary thoracic oncology consultation suggested. 6. Spectrum of background findings compatible with underlying fibrotic interstitial lung disease without frank honeycombing and without appreciable apicobasilar gradient. No appreciable interval progression. Findings are indeterminate for UIP per consensus guidelines: Diagnosis of Idiopathic Pulmonary Fibrosis: An Official ATS/ERS/JRS/ALAT Clinical Practice Guideline. [place], Iss 5, [id number], Dec 07 2016. 7. Stable mild cardiomegaly. Three-vessel coronary atherosclerosis. 8. Aortic Atherosclerosis (ICD10-I70.0) and Emphysema (ICD10-J43.9). These results will be called to the ordering clinician or representative by the Radiologist Assistant, and communication documented in the PACS or [employer]. Electronically Signed   By: Ilona Sorrel M.D.   On: 08/14/2020 18:20    Labs:  CBC: Recent Labs    12/17/19 1520 08/17/20 1437 08/29/20 1201  WBC 7.1 5.1 7.5  HGB 10.9* 11.1* 11.2*  HCT 33.2* 32.7* 35.3*  PLT 362 200.0 245    COAGS: Recent Labs    08/29/20 1201  INR 1.1    BMP: Recent Labs    12/17/19 1519 08/17/20 1437  NA 130* 136  K 4.6 3.9  CL 88* 98  CO2 30 30  GLUCOSE 108* 80  BUN 39* 16  CALCIUM 9.7 9.4  CREATININE 1.50* 0.85  GFRNONAA 32*  --   GFRAA 37*  --     LIVER FUNCTION TESTS: Recent Labs    12/17/19 1519 08/17/20 1437  BILITOT 0.6 0.5  AST 18 18  ALT 13 11  ALKPHOS  --  42  PROT 6.9 6.7  ALBUMIN  --  4.1    TUMOR MARKERS: No results for input(s): AFPTM, CEA, CA199, CHROMGRNA in the last 8760 hours.  Assessment and Plan: Liver lesions in setting of large lung mass For US guided live rlesion biopsy Labs reviewed. Risks and benefits of liver lesion biopsy was discussed with the patient and/or patient's family including, but not limited to bleeding, infection, damage to adjacent structures or low  yield requiring additional tests.  All of the questions were answered and there is agreement to proceed.  Consent signed and in chart.    Thank you for this interesting consult.  I greatly enjoyed meeting  Dawn Gomez and look forward to participating in their care.  A copy of this report was sent to the requesting provider on this date.  Electronically Signed: Ascencion Dike, PA-C 08/29/2020, 12:38 PM   I spent a total of 20 minutes in face to face in clinical consultation, greater than 50% of which was counseling/coordinating care for liver mass biopsy

## 2020-08-30 NOTE — Telephone Encounter (Signed)
Pt had procedure, liver biopsy, yesterday 08/29/20. Will close encounter.

## 2020-08-31 ENCOUNTER — Encounter: Payer: Self-pay | Admitting: *Deleted

## 2020-08-31 ENCOUNTER — Other Ambulatory Visit: Payer: Self-pay | Admitting: Medical Oncology

## 2020-08-31 ENCOUNTER — Other Ambulatory Visit: Payer: Self-pay

## 2020-08-31 ENCOUNTER — Inpatient Hospital Stay: Payer: Medicare HMO

## 2020-08-31 ENCOUNTER — Encounter: Payer: Self-pay | Admitting: Internal Medicine

## 2020-08-31 ENCOUNTER — Inpatient Hospital Stay: Payer: Medicare HMO | Attending: Internal Medicine | Admitting: Internal Medicine

## 2020-08-31 DIAGNOSIS — Z803 Family history of malignant neoplasm of breast: Secondary | ICD-10-CM | POA: Diagnosis not present

## 2020-08-31 DIAGNOSIS — Z5111 Encounter for antineoplastic chemotherapy: Secondary | ICD-10-CM | POA: Insufficient documentation

## 2020-08-31 DIAGNOSIS — Z841 Family history of disorders of kidney and ureter: Secondary | ICD-10-CM | POA: Diagnosis not present

## 2020-08-31 DIAGNOSIS — C3490 Malignant neoplasm of unspecified part of unspecified bronchus or lung: Secondary | ICD-10-CM

## 2020-08-31 DIAGNOSIS — Z808 Family history of malignant neoplasm of other organs or systems: Secondary | ICD-10-CM

## 2020-08-31 DIAGNOSIS — R222 Localized swelling, mass and lump, trunk: Secondary | ICD-10-CM

## 2020-08-31 DIAGNOSIS — Z801 Family history of malignant neoplasm of trachea, bronchus and lung: Secondary | ICD-10-CM | POA: Diagnosis not present

## 2020-08-31 DIAGNOSIS — Z87891 Personal history of nicotine dependence: Secondary | ICD-10-CM

## 2020-08-31 DIAGNOSIS — C3432 Malignant neoplasm of lower lobe, left bronchus or lung: Secondary | ICD-10-CM | POA: Insufficient documentation

## 2020-08-31 LAB — CBC WITH DIFFERENTIAL (CANCER CENTER ONLY)
Abs Immature Granulocytes: 0.02 10*3/uL (ref 0.00–0.07)
Basophils Absolute: 0 10*3/uL (ref 0.0–0.1)
Basophils Relative: 1 %
Eosinophils Absolute: 0.2 10*3/uL (ref 0.0–0.5)
Eosinophils Relative: 3 %
HCT: 32.9 % — ABNORMAL LOW (ref 36.0–46.0)
Hemoglobin: 10.8 g/dL — ABNORMAL LOW (ref 12.0–15.0)
Immature Granulocytes: 0 %
Lymphocytes Relative: 17 %
Lymphs Abs: 1.1 10*3/uL (ref 0.7–4.0)
MCH: 30.2 pg (ref 26.0–34.0)
MCHC: 32.8 g/dL (ref 30.0–36.0)
MCV: 91.9 fL (ref 80.0–100.0)
Monocytes Absolute: 0.7 10*3/uL (ref 0.1–1.0)
Monocytes Relative: 11 %
Neutro Abs: 4.4 10*3/uL (ref 1.7–7.7)
Neutrophils Relative %: 68 %
Platelet Count: 215 10*3/uL (ref 150–400)
RBC: 3.58 MIL/uL — ABNORMAL LOW (ref 3.87–5.11)
RDW: 13.7 % (ref 11.5–15.5)
WBC Count: 6.4 10*3/uL (ref 4.0–10.5)
nRBC: 0 % (ref 0.0–0.2)

## 2020-08-31 LAB — CMP (CANCER CENTER ONLY)
ALT: 13 U/L (ref 0–44)
AST: 21 U/L (ref 15–41)
Albumin: 3.3 g/dL — ABNORMAL LOW (ref 3.5–5.0)
Alkaline Phosphatase: 49 U/L (ref 38–126)
Anion gap: 9 (ref 5–15)
BUN: 15 mg/dL (ref 8–23)
CO2: 28 mmol/L (ref 22–32)
Calcium: 9.1 mg/dL (ref 8.9–10.3)
Chloride: 99 mmol/L (ref 98–111)
Creatinine: 1.05 mg/dL — ABNORMAL HIGH (ref 0.44–1.00)
GFR, Estimated: 53 mL/min — ABNORMAL LOW (ref >60)
Glucose, Bld: 101 mg/dL — ABNORMAL HIGH (ref 70–99)
Potassium: 4.2 mmol/L (ref 3.5–5.1)
Sodium: 136 mmol/L (ref 135–145)
Total Bilirubin: 0.7 mg/dL (ref 0.3–1.2)
Total Protein: 6.1 g/dL — ABNORMAL LOW (ref 6.5–8.1)

## 2020-08-31 LAB — SURGICAL PATHOLOGY

## 2020-08-31 MED ORDER — LIDOCAINE-PRILOCAINE 2.5-2.5 % EX CREA: TOPICAL_CREAM | CUTANEOUS | 0 refills | Status: DC

## 2020-08-31 MED ORDER — PROCHLORPERAZINE MALEATE 10 MG PO TABS: 10.0000 mg | ORAL_TABLET | Freq: Four times a day (QID) | ORAL | 0 refills | Status: AC | PRN

## 2020-08-31 NOTE — Progress Notes (Signed)
  Oncology Nurse Navigator Documentation  Oncology Nurse Navigator Flowsheets 08/31/2020  Abnormal Finding Date 08/14/2020  Confirmed Diagnosis Date 08/29/2020  Diagnosis Status Confirmed Diagnosis Complete  Planned Course of Treatment Chemotherapy  Phase of Treatment Chemo  Navigator Follow Up Date: 09/05/2020  Navigator Follow Up Reason: Appointment Review  Navigator Location [facility]  Referral Date to RadOnc/MedOnc 08/23/2020  Navigator Encounter Type Clinic/MDC;Initial MedOnc/spoke to patient and son at Ms. Kron's first appt to see Dr. Julien Nordmann.  Patient is newly dx small cell lung cancer. I help to explain plan of care and next steps. I provided written and verbal explanation of disease and interventions.  She verbalized understanding.    Patient Visit Type MedOnc;Initial  Treatment Phase Pre-Tx/Tx Discussion  Barriers/Navigation Needs Education  Education Newly Diagnosed Cancer Education;Other  Interventions Education;Psycho-Social Support  Acuity Level 2-Minimal Needs (1-2 Barriers Identified)  Education Method Verbal;Written  Time Spent with Patient 34

## 2020-08-31 NOTE — Progress Notes (Signed)
[place] [phone]   [phone]  CONSULT NOTE  REFERRING PHYSICIAN: Dr. Marshell Garfinkel  REASON FOR CONSULTATION:  85 years old white female recently diagnosed with lung cancer.  HPI Dawn Gomez is a 85 y.o. female with past medical history significant for aortic aneurysm, hypothyroidism, obstructive sleep apnea, extensive psoriasis, pulmonary fibrosis as well as SVT and history of smoking but quit in 2008.  The patient was followed by Dr. Vaughan Browner for her history of pulmonary fibrosis and he ordered CT high-resolution of the chest which was performed on 08/14/2020 for evaluation of the worsening shortness of breath.  The scan showed large irregular left lower lobe 8.8 x 7.6 cm solid mass contiguous with the left hilar adenopathy and abutting the peripheral pleural surface new since the February 23, 2019 CT of the chest highly suspicious for primary bronchogenic carcinoma.  There was several additional confluent satellite solid nodules in the left lower lobe Suspicious for ipsilateral pulmonary metastasis.  There was also new subcarinal and AP window mediastinal lymphadenopathy suspicious for nodal metastatic disease and to new low-attenuation liver masses partially visualized and suspicious for liver metastasis.  On 08/29/2020 the patient underwent ultrasound-guided liver lesion biopsy by interventional radiology.  The final pathology [id number]) was consistent with a small cell carcinoma. By immunohistochemistry, the neoplastic cells are positive for TTF-1, CD56, synaptophysin and chromogranin (weak) but negative for p40, supporting the diagnosis of small cell carcinoma. Dr. Saralyn Pilar reviewed the case and agrees with the above diagnosis. Dr. Vaughan Browner kindly referred the patient to me today for evaluation and recommendation regarding treatment of her condition. When seen today she is feeling fine except for generalized weakness as well as pain in the  right ribs and left shoulder area.  Her pain is worse when she is leaning forward.  She also has cough with blood-tinged sputum and shortness of breath at baseline increased with exertion.  She is currently on home oxygen.  She lost around 20 pounds in the last 4 months.  She has no nausea, vomiting, diarrhea but has constipation.  The patient also has intermittent headache for the last 3-4 months. Family history significant for father with prostate and lung cancer with bone metastasis.  Mother had lung cancer and sister had liver cancer. The patient is single and has 1 son, Grayland Ormond [phone] who accompanied her to the visit today.  The patient used to work as a Equities trader.  She has a history of smoking more than 2 pack/day for around 30 years but quit in 2008.  She does not have any history of alcohol or drug abuse.  HPI  Past Medical History:  Diagnosis Date  . Abnormal heart rhythm   . Aortic aneurysm ([place])   . Aortic atherosclerosis ([place]) 03/03/2019  . Dyspnea   . Hypothyroid   . OSA (obstructive sleep apnea)    on cpap  . Psoriasis 02/05/2017  . Pulmonary fibrosis ([place])   . SVT (supraventricular tachycardia) ([place])   . Thyroid disease    hypo    Past Surgical History:  Procedure Laterality Date  . ABDOMINAL HYSTERECTOMY    . APPENDECTOMY    . CHOLECYSTECTOMY    . CRANIOTOMY     for aneurysms  . CRANIOTOMY  1980 x 2   aneurysmal clipping  . HERNIA REPAIR    . TOTAL ABDOMINAL HYSTERECTOMY    . TUBAL LIGATION      Family History  Problem Relation Age of Onset  .  Emphysema Father   . Heart disease Father   . Clotting disorder Father   . Rheum arthritis Father   . Lung cancer Father   . Prostate cancer Father   . Bone cancer Father   . Emphysema Sister   . Emphysema Mother   . Heart disease Mother   . Rheum arthritis Mother   . Lung cancer Mother   . Allergies Sister   . Clotting disorder Sister   . Rheum arthritis Sister   . Breast cancer Sister   .  Cancer Sister        ureter    Social History Social History   Tobacco Use  . Smoking status: Former Smoker    Packs/day: 2.00    Years: 50.00    Pack years: 100.00    Types: Cigarettes    Quit date: 2008    Years since quitting: 14.4  . Smokeless tobacco: Never Used  Substance Use Topics  . Alcohol use: Yes    Comment: "drank back in her younger wilder days"  . Drug use: No    Allergies  Allergen Reactions  . Lipitor [Atorvastatin] Shortness Of Breath, Diarrhea and Other (See Comments)    Dizzy, pain all over.  . Codeine   . Hydromorphone Hcl     REACTION: dementia  . Levaquin [Levofloxacin In D5w]     Shock per pt but can take cipro  . Morphine   . Oxycodone-Acetaminophen   . Propoxyphene N-Acetaminophen   . Simvastatin     Current Outpatient Medications  Medication Sig Dispense Refill  . acetaminophen (TYLENOL) 500 MG tablet Take 500 mg by mouth every 6 (six) hours as needed.    . Ascorbic Acid (VITAMIN C PO) Take by mouth.    . Azelastine-Fluticasone 137-50 MCG/ACT SUSP Place 1 spray into the nose every 12 (twelve) hours. 23 g 5  . Blood Glucose Monitoring Suppl (TRUE METRIX METER) w/Device KIT USE AS DIRECTED 1 kit 0  . CALCIUM-MAGNESIUM-ZINC PO Take 1 tablet by mouth daily.    . cholecalciferol (VITAMIN D) 1000 units tablet Take 5,000 Units by mouth daily.    Marland Kitchen diltiazem (CARDIZEM CD) 180 MG 24 hr capsule Take 1 capsule (180 mg total) by mouth daily. 90 capsule 2  . escitalopram (LEXAPRO) 20 MG tablet Take 1 tablet (20 mg total) by mouth at bedtime. 90 tablet 3  . fenofibrate 160 MG tablet Take 1 tablet (160 mg total) by mouth daily. 90 tablet 1  . fluocinonide (LIDEX) 0.05 % external solution APP EXT AA OF SCALP NIGHTLY  3  . furosemide (LASIX) 80 MG tablet Take 1 tablet (80 mg total) by mouth daily. 90 tablet 3  . guaiFENesin (MUCINEX) 600 MG 12 hr tablet Take 2 tablets (1,200 mg total) by mouth 2 (two) times daily as needed. 60 tablet 2  .  HYDROcodone-acetaminophen (NORCO/VICODIN) 5-325 MG tablet Take 1 tablet by mouth every 6 (six) hours as needed. 30 tablet 0  . ipratropium-albuterol (DUONEB) 0.5-2.5 (3) MG/3ML SOLN Take 3 mLs by nebulization every 4 (four) hours as needed. 360 mL 0  . levothyroxine (SYNTHROID) 100 MCG tablet Take 1 tablet (100 mcg total) by mouth daily before breakfast. 90 tablet 1  . lisinopril (ZESTRIL) 5 MG tablet Take 1 tablet (5 mg total) by mouth daily. 90 tablet 1  . potassium chloride SA (KLOR-CON) 20 MEQ tablet TAKE 1 TABLET EVERY DAY WITH FOOD 90 tablet 3  . rosuvastatin (CRESTOR) 20 MG tablet Take 1 tablet (  20 mg total) by mouth daily. 90 tablet 3  . tiZANidine (ZANAFLEX) 2 MG tablet Take 1 tablet (2 mg total) by mouth 3 (three) times daily. 270 tablet 1  . traMADol (ULTRAM) 50 MG tablet Take 1 tablet (50 mg total) by mouth 4 (four) times daily. 360 tablet 1  . traZODone (DESYREL) 50 MG tablet Take 1 tablet (50 mg total) by mouth at bedtime as needed for sleep. 90 tablet 1  . vitamin B-12 (CYANOCOBALAMIN) 1000 MCG tablet Take 500 mcg by mouth daily.    . vitamin C (ASCORBIC ACID) 500 MG tablet Take 500 mg by mouth daily.    . Vitamin D, Ergocalciferol, (DRISDOL) 50000 units CAPS capsule Take 1 capsule (50,000 Units total) by mouth every 7 (seven) days. 12 capsule 0  . vitamin E 400 UNIT capsule Take 200 Units by mouth daily.     No current facility-administered medications for this visit.    Review of Systems  Constitutional: positive for anorexia, fatigue and weight loss Eyes: negative Ears, nose, mouth, throat, and face: negative Respiratory: positive for cough and dyspnea on exertion Cardiovascular: negative Gastrointestinal: positive for constipation Genitourinary:negative Integument/breast: negative Hematologic/lymphatic: negative Musculoskeletal:positive for back pain Neurological: positive for headaches Behavioral/Psych: negative Endocrine: negative Allergic/Immunologic:  negative  Physical Exam  WUX:LKGMW, healthy, no distress, well nourished and well developed SKIN: skin color, texture, turgor are normal, no rashes or significant lesions HEAD: Normocephalic, No masses, lesions, tenderness or abnormalities EYES: normal, PERRLA, Conjunctiva are pink and non-injected EARS: External ears normal, Canals clear OROPHARYNX:no exudate, no erythema and lips, buccal mucosa, and tongue normal  NECK: supple, no adenopathy, no JVD LYMPH:  no palpable lymphadenopathy, no hepatosplenomegaly BREAST:not examined LUNGS: clear to auscultation , and palpation HEART: regular rate & rhythm, no murmurs and no gallops ABDOMEN:abdomen soft, non-tender, normal bowel sounds and no masses or organomegaly BACK: No CVA tenderness, Range of motion is normal EXTREMITIES:no joint deformities, effusion, or inflammation, no edema  NEURO: alert & oriented x 3 with fluent speech, no focal motor/sensory deficits  PERFORMANCE STATUS: ECOG 1  LABORATORY DATA: Lab Results  Component Value Date   WBC 6.4 08/31/2020   HGB 10.8 (L) 08/31/2020   HCT 32.9 (L) 08/31/2020   MCV 91.9 08/31/2020   PLT 215 08/31/2020      Chemistry      Component Value Date/Time   NA 136 08/17/2020 1437   K 3.9 08/17/2020 1437   CL 98 08/17/2020 1437   CO2 30 08/17/2020 1437   BUN 16 08/17/2020 1437   CREATININE 0.85 08/17/2020 1437   CREATININE 1.50 (H) 12/17/2019 1519      Component Value Date/Time   CALCIUM 9.4 08/17/2020 1437   ALKPHOS 42 08/17/2020 1437   AST 18 08/17/2020 1437   ALT 11 08/17/2020 1437   BILITOT 0.5 08/17/2020 1437       RADIOGRAPHIC STUDIES: DG Thoracic Spine 2 View  Result Date: 08/19/2020 CLINICAL DATA:  Pain near bra line for 2 weeks after a fall backwards onto cement stairs, mid to low back pain for 2 weeks EXAM: THORACIC SPINE 2 VIEWS COMPARISON:  02/05/2017 FINDINGS: Osseous demineralization. Multilevel disc space narrowing and endplate spur formation. Vertebral  body heights maintained. No fracture, subluxation, or bone destruction. Atherosclerotic calcifications aorta. Extensive chronic interstitial lung disease/fibrosis. IMPRESSION: Osseous demineralization with multilevel degenerative disc disease changes thoracic spine. No acute osseous abnormalities. Chronic interstitial lung disease/pulmonary fibrosis, progressive since 2018. Aortic Atherosclerosis (ICD10-I70.0). Electronically Signed   By: Lavonia Dana  M.D.   On: 08/19/2020 13:06   DG Lumbar Spine Complete  Result Date: 08/19/2020 CLINICAL DATA:  Mid to lower back pain for 2 weeks post fall backwards onto cement stairs EXAM: LUMBAR SPINE - COMPLETE 4+ VIEW COMPARISON:  12/23/2019 FINDINGS: Five non-rib-bearing lumbar vertebra. Multilevel facet degenerative changes. Vertebral body heights maintained. Scattered disc space narrowing with tiny endplate spurs at O53-G6. No fracture, subluxation, or bone destruction. No spondylolysis. SI joints preserved. Extensive atherosclerotic calcifications aorta and iliac arteries. IMPRESSION: Osseous demineralization with degenerative disc and facet disease changes of thoracolumbar spine. No acute abnormalities. Electronically Signed   By: Lavonia Dana M.D.   On: 08/19/2020 13:08   CT Chest High Resolution  Result Date: 08/14/2020 CLINICAL DATA:  Chronic dyspnea with exertion, on oxygen therapy. Follow-up interstitial lung disease. EXAM: CT CHEST WITHOUT CONTRAST TECHNIQUE: Multidetector CT imaging of the chest was performed following the standard protocol without intravenous contrast. High resolution imaging of the lungs, as well as inspiratory and expiratory imaging, was performed. COMPARISON:  02/23/2019 high-resolution chest CT. FINDINGS: Cardiovascular: Stable mild cardiomegaly. No significant pericardial effusion/thickening. Three-vessel coronary atherosclerosis. Atherosclerotic nonaneurysmal thoracic aorta. Top-normal caliber main pulmonary artery (3.1 cm diameter).  Mediastinum/Nodes: No discrete thyroid nodules. Unremarkable esophagus. No axillary adenopathy. Newly enlarged 1.6 cm short axis diameter subcarinal node (series 2/image 59). Newly enlarged AP window nodes up to 1.3 cm (series 2/image 46). No discrete right hilar adenopathy on these noncontrast images. Lungs/Pleura: No pneumothorax. No pleural effusion. Severe centrilobular and paraseptal emphysema with diffuse bronchial wall thickening. Large irregular left lower lobe 8.8 x 7.6 cm solid lung mass (series 3/image 77), contiguous with left hilar adenopathy and abutting the peripheral pleural surface, new since 02/23/2019 chest CT. Several additional confluent satellite solid nodules in the left lower lobe, for example measuring 1.2 cm (series 3/image 91), new. Apical left upper lobe 0.5 cm sub solid nodule (series 3/image 31), stable. No significant right pulmonary nodules. Background of patchy peribronchovascular and subpleural reticulation and ground-glass opacity throughout both lungs with associated mild-to-moderate traction bronchiectasis and architectural distortion. No frank honeycombing. No clear apicobasilar gradient to these findings. These background findings have not substantially changed. No significant lobular air trapping or evidence of tracheobronchomalacia on the expiration sequence. Upper abdomen: Partially visualized hypodense 2.7 cm inferior right liver mass (series 2/image 133) and hypodense 1.7 cm inferior segment 4B left liver mass (series 2/image 131), new. Musculoskeletal: No aggressive appearing focal osseous lesions. Marked thoracic spondylosis. IMPRESSION: 1. Large irregular left lower lobe 8.8 x 7.6 cm solid lung mass, contiguous with left hilar adenopathy and abutting the peripheral pleural surface, new since 02/23/2019 chest CT, highly suspicious for primary bronchogenic carcinoma. 2. Several additional confluent satellite solid nodules in the left lower lobe, new, suspicious for  ipsilateral pulmonary metastases. 3. New subcarinal and AP window mediastinal lymphadenopathy, suspicious for nodal metastatic disease. 4. Two new low-attenuation liver masses, partially visualized, suspicious for liver metastases. 5. PET-CT recommended for further staging evaluation. Multi disciplinary thoracic oncology consultation suggested. 6. Spectrum of background findings compatible with underlying fibrotic interstitial lung disease without frank honeycombing and without appreciable apicobasilar gradient. No appreciable interval progression. Findings are indeterminate for UIP per consensus guidelines: Diagnosis of Idiopathic Pulmonary Fibrosis: An Official ATS/ERS/JRS/ALAT Clinical Practice Guideline. [place], Iss 5, [phone], Dec 07 2016. 7. Stable mild cardiomegaly. Three-vessel coronary atherosclerosis. 8. Aortic Atherosclerosis (ICD10-I70.0) and Emphysema (ICD10-J43.9). These results will be called to the ordering clinician or representative by the Radiologist  Environmental consultant, and communication documented in the PACS or [employer]. Electronically Signed   By: Ilona Sorrel M.D.   On: 08/14/2020 18:20   [place] CORE BIOPSY (LIVER)  Result Date: 08/29/2020 INDICATION: 85 year old with left lung mass and liver lesions. Tissue diagnosis is needed. EXAM: ULTRASOUND-GUIDED LIVER LESION BIOPSY MEDICATIONS: Moderate sedation ANESTHESIA/SEDATION: Moderate (conscious) sedation was employed during this procedure. A total of Versed 2.0 mg and Fentanyl 100 mcg was administered intravenously. Moderate Sedation Time: 13 minutes. The patient's level of consciousness and vital signs were monitored continuously by radiology nursing throughout the procedure under my direct supervision. FLUOROSCOPY TIME:  Fluoroscopy Time: None COMPLICATIONS: None immediate. PROCEDURE: Informed written consent was obtained from the patient after a thorough discussion of the procedural risks, benefits and alternatives.  All questions were addressed. A timeout was performed prior to the initiation of the procedure. Liver was evaluated with ultrasound. Large lesion in the right hepatic lobe was targeted for biopsy. Right abdominal subcostal region was prepped with chlorhexidine and sterile field was created. Maximal barrier sterile technique was utilized including caps, mask, sterile gowns, sterile gloves, sterile drape, hand hygiene and skin antiseptic. Skin was anesthetized with 1% lidocaine. A small incision was made. Using ultrasound guidance, a 17 gauge coaxial needle was directed into the hepatic lesion and multiple core biopsies were obtained an 18 gauge device. Gel-Foam slurry was injected through the 17 gauge needle as it was removed. Bandage placed over the puncture site. FINDINGS: Several hepatic lesions are compatible with metastatic disease. Dominant lesion in the right hepatic lobe measures up to 3.3 cm and this lesion was targeted for biopsy. Needle position confirmed within the lesion. Adequate core specimens obtained and placed in formalin. No immediate bleeding or hematoma formation. Gel-Foam slurry identified within the lesion at the end of the procedure. IMPRESSION: Ultrasound-guided core biopsy of a right hepatic lesion. Electronically Signed   By: Markus Daft M.D.   On: 08/29/2020 15:41    ASSESSMENT: This is a very pleasant 85 years old white female recently diagnosed with extensive stage (T4, N2, M1 C) small cell lung cancer presented with large left lower lobe lung mass in addition to right hilar and mediastinal lymphadenopathy as well as satellite nodule and the left lower lobe and peripheral pleural lesions as well as 2 liver metastasis, pending further staging work-up diagnosed in May 2022.   PLAN: I had a lengthy discussion with the patient and her son today about her current disease stage, prognosis and treatment options. I personally and independently reviewed the scan images and discussed the  result and showed the images to the patient and her son. She has a PET scan scheduled to be done on Sep 05, 2020. I will complete the staging work-up by ordering MRI of the brain to rule out brain metastasis. The patient and her son understand that she had incurable condition and all the treatment will be of palliative nature. She has extensive psoriasis and she would not be a great candidate for immunotherapy. I did The patient's option of palliative care and hospice referral versus consideration of palliative systemic chemotherapy with carboplatin for AUC of 5 on day 1 and etoposide 100 Mg/M2 on days 1, 2 and 3 with Cosela before chemotherapy.  I will not add immunotherapy to her regimen because of the extensive psoriasis. I discussed with the patient the adverse effect of this treatment including but not limited to alopecia, myelosuppression, nausea and vomiting, peripheral neuropathy, liver or renal dysfunction. The patient is  interested in proceeding with treatment and she is expected to start the first cycle of this treatment on 09/11/2020. I will arrange for the patient to have a chemotherapy education class before the first dose of her treatment. I will call her pharmacy with prescription for Compazine 10 mg p.o. every 6 hours as needed for nausea in addition to Emla cream to be applied to the Port-A-Cath site before treatment. I will refer the patient to IR for Port-A-Cath placement. The patient will come back for follow-up visit in 1 week after the first dose of her treatment for evaluation and management of any adverse effect of her treatment. She was advised to call immediately if she has any other concerning symptoms in the interval. The patient voices understanding of current disease status and treatment options and is in agreement with the current care plan.  All questions were answered. The patient knows to call the clinic with any problems, questions or concerns. We can certainly see  the patient much sooner if necessary.  Thank you so much for allowing me to participate in the care of [facility] I will continue to follow up the patient with you and assist in her care. The total time spent in the appointment was 90 minutes.  Disclaimer: This note was dictated with voice recognition software. Similar sounding words can inadvertently be transcribed and may not be corrected upon review.   Eilleen Kempf Aug 31, 2020, 2:03 PM

## 2020-08-31 NOTE — Progress Notes (Signed)
START ON PATHWAY REGIMEN - Small Cell Lung     Cycles 1 through 4: A cycle is every 21 days:     Durvalumab      Carboplatin      Etoposide    Cycles 5 and beyond: A cycle is every 28 days:     Durvalumab   **Always confirm dose/schedule in your pharmacy ordering system**  Patient Characteristics: Newly Diagnosed, Preoperative or Nonsurgical Candidate (Clinical Staging), First Line, Extensive Stage Therapeutic Status: Newly Diagnosed, Preoperative or Nonsurgical Candidate (Clinical Staging) AJCC T Category: cT3 AJCC N Category: cN2 AJCC M Category: cM1c AJCC 8 Stage Grouping: IVB Stage Classification: Extensive  Intent of Therapy: Non-Curative / Palliative Intent, Discussed with Patient

## 2020-09-01 ENCOUNTER — Telehealth: Payer: Self-pay | Admitting: *Deleted

## 2020-09-01 NOTE — Telephone Encounter (Signed)
I followed up on Dawn Gomez's MRI Brain. This scan has been authorized with insurance. I called central scheduling to get her an appt. I then called Dawn Gomez and updated her on appt date, time, and location. She verbalized understanding.

## 2020-09-05 ENCOUNTER — Encounter: Payer: Self-pay | Admitting: *Deleted

## 2020-09-05 ENCOUNTER — Telehealth: Payer: Self-pay | Admitting: Internal Medicine

## 2020-09-05 ENCOUNTER — Ambulatory Visit (HOSPITAL_COMMUNITY)
Admission: RE | Admit: 2020-09-05 | Discharge: 2020-09-05 | Disposition: A | Discharge status: Home / Self Care | Payer: Medicare HMO | Source: Ambulatory Visit | Attending: Pulmonary Disease | Admitting: Pulmonary Disease

## 2020-09-05 ENCOUNTER — Other Ambulatory Visit: Payer: Self-pay | Admitting: Internal Medicine

## 2020-09-05 ENCOUNTER — Telehealth: Payer: Self-pay | Admitting: Medical Oncology

## 2020-09-05 ENCOUNTER — Other Ambulatory Visit: Payer: Self-pay

## 2020-09-05 DIAGNOSIS — Z95828 Presence of other vascular implants and grafts: Secondary | ICD-10-CM

## 2020-09-05 DIAGNOSIS — C7951 Secondary malignant neoplasm of bone: Secondary | ICD-10-CM | POA: Insufficient documentation

## 2020-09-05 DIAGNOSIS — C3490 Malignant neoplasm of unspecified part of unspecified bronchus or lung: Secondary | ICD-10-CM | POA: Diagnosis not present

## 2020-09-05 DIAGNOSIS — C787 Secondary malignant neoplasm of liver and intrahepatic bile duct: Secondary | ICD-10-CM | POA: Diagnosis not present

## 2020-09-05 DIAGNOSIS — R918 Other nonspecific abnormal finding of lung field: Secondary | ICD-10-CM | POA: Diagnosis present

## 2020-09-05 LAB — GLUCOSE, CAPILLARY: Glucose-Capillary: 88 mg/dL (ref 70–99)

## 2020-09-05 MED ORDER — FLUDEOXYGLUCOSE F - 18 (FDG) INJECTION
6.5000 | Freq: Once | INTRAVENOUS | Status: AC | PRN
  Administered 2020-09-05: 7.18 via INTRAVENOUS

## 2020-09-05 NOTE — Telephone Encounter (Signed)
Son does not have any record of pts brain clips . I told him Dr Julien Nordmann ordered a CT of head.

## 2020-09-05 NOTE — Progress Notes (Signed)
Dr. Julien Nordmann changed MRI scan to CT head. I updated auth coordinator to get this authorized with patients insurance.

## 2020-09-05 NOTE — Telephone Encounter (Signed)
Diane from Dr.Muhammad's office has called in regards to the patient.  She would like to know if we have any records or cards on the type of Brain Aneurysm clips the pt has   Please advise.. [phone]

## 2020-09-05 NOTE — Telephone Encounter (Signed)
Very sorry, I dont have that information, unless it is in the EMR medical record

## 2020-09-05 NOTE — Progress Notes (Signed)
Patient has 2 Aneurysm clips from 1980 with no cards/OP Note. Per Dr Earlie Server pt to have Ct head instead.

## 2020-09-06 ENCOUNTER — Ambulatory Visit (HOSPITAL_COMMUNITY)
Admission: RE | Admit: 2020-09-06 | Discharge: 2020-09-06 | Disposition: A | Discharge status: Home / Self Care | Payer: Medicare HMO | Source: Ambulatory Visit | Attending: Internal Medicine | Admitting: Internal Medicine

## 2020-09-06 ENCOUNTER — Telehealth: Payer: Self-pay | Admitting: Pulmonary Disease

## 2020-09-06 MED ORDER — LIDOCAINE-PRILOCAINE 2.5-2.5 % EX CREA: TOPICAL_CREAM | CUTANEOUS | 0 refills | Status: DC

## 2020-09-06 NOTE — Telephone Encounter (Signed)
Notified Diane with Dr. Pricilla Larsson office

## 2020-09-06 NOTE — Addendum Note (Signed)
Addended by: Ardeen Garland on: 09/06/2020 01:06 PM   Modules accepted: Orders

## 2020-09-06 NOTE — Telephone Encounter (Signed)
Called in directions for emla cream.

## 2020-09-06 NOTE — Telephone Encounter (Signed)
PM pt is requesting to have an order sent over for a POC.   She has an upcoming appt with you on 06/30 after a PFT.  Looks like she has not had a walk since 2020.  Do you want [place] to schedule her for a walk prior to this appt or wait until this appt.  Thanks

## 2020-09-07 ENCOUNTER — Encounter: Payer: Self-pay | Admitting: *Deleted

## 2020-09-07 NOTE — Progress Notes (Signed)
I followed up on Dawn Gomez's schedule. She has her CT head scheduled for 6/8.  I reached out to our scheduling team to get her set up for her infusion on 6/9.

## 2020-09-07 NOTE — Telephone Encounter (Signed)
Appt notes updated to reflect that qualifying walk is needed [facility]

## 2020-09-07 NOTE — Telephone Encounter (Signed)
Yes. Please schedule a qualifying walk on day of return clinic visit

## 2020-09-11 ENCOUNTER — Inpatient Hospital Stay: Payer: Medicare HMO

## 2020-09-11 DIAGNOSIS — J449 Chronic obstructive pulmonary disease, unspecified: Secondary | ICD-10-CM | POA: Diagnosis not present

## 2020-09-11 DIAGNOSIS — J8410 Pulmonary fibrosis, unspecified: Secondary | ICD-10-CM | POA: Diagnosis not present

## 2020-09-11 DIAGNOSIS — J9621 Acute and chronic respiratory failure with hypoxia: Secondary | ICD-10-CM | POA: Diagnosis not present

## 2020-09-11 DIAGNOSIS — G4733 Obstructive sleep apnea (adult) (pediatric): Secondary | ICD-10-CM | POA: Diagnosis not present

## 2020-09-12 ENCOUNTER — Encounter: Payer: Self-pay | Admitting: Internal Medicine

## 2020-09-12 ENCOUNTER — Encounter: Payer: Self-pay | Admitting: *Deleted

## 2020-09-12 ENCOUNTER — Other Ambulatory Visit: Payer: Medicare HMO

## 2020-09-12 ENCOUNTER — Other Ambulatory Visit: Payer: Self-pay

## 2020-09-12 ENCOUNTER — Inpatient Hospital Stay: Payer: Medicare HMO | Attending: Internal Medicine

## 2020-09-12 DIAGNOSIS — R042 Hemoptysis: Secondary | ICD-10-CM | POA: Insufficient documentation

## 2020-09-12 DIAGNOSIS — C3412 Malignant neoplasm of upper lobe, left bronchus or lung: Secondary | ICD-10-CM | POA: Insufficient documentation

## 2020-09-12 DIAGNOSIS — C778 Secondary and unspecified malignant neoplasm of lymph nodes of multiple regions: Secondary | ICD-10-CM | POA: Insufficient documentation

## 2020-09-12 DIAGNOSIS — Z5111 Encounter for antineoplastic chemotherapy: Secondary | ICD-10-CM | POA: Insufficient documentation

## 2020-09-12 DIAGNOSIS — Z90710 Acquired absence of both cervix and uterus: Secondary | ICD-10-CM | POA: Insufficient documentation

## 2020-09-12 DIAGNOSIS — C7951 Secondary malignant neoplasm of bone: Secondary | ICD-10-CM | POA: Insufficient documentation

## 2020-09-12 DIAGNOSIS — Z79899 Other long term (current) drug therapy: Secondary | ICD-10-CM | POA: Insufficient documentation

## 2020-09-12 DIAGNOSIS — C787 Secondary malignant neoplasm of liver and intrahepatic bile duct: Secondary | ICD-10-CM | POA: Insufficient documentation

## 2020-09-12 NOTE — Progress Notes (Signed)
I received vm messages from Dawn Gomez. I called her to see what I can do to help. She was asking about her schedule.  I was unclear of her radiology schedule and pre-procedure instructions so I called them. I then called Dawn Gomez back with an update of her appts this week and pre-procedure instructions for radiology port placement. She verbalized understanding.

## 2020-09-12 NOTE — Progress Notes (Signed)
Met with patient/accompanying adult at registration to introduce myself as Financial Resource Specialist and to offer available resources.  Discussed one-time $1000 [employer] grant and qualifications to assist with personal expenses while going through treatment.  Gave her my card if interested in applying and for any additional financial questions or concerns. 

## 2020-09-12 NOTE — Telephone Encounter (Signed)
Called and left detailed message for pt about her appts and the walk that can be done at the visit. Advised pt to call back needed. Will close encounter.

## 2020-09-12 NOTE — Progress Notes (Signed)
Pharmacist Chemotherapy Monitoring - Initial Assessment    Anticipated start date: 09/18/20    Regimen:  . Are orders appropriate based on the patient's diagnosis, regimen, and cycle? Yes . Does the plan date match the patient's scheduled date? Yes . Is the sequencing of drugs appropriate? Yes . Are the premedications appropriate for the patient's regimen? Yes . Prior Authorization for treatment is: Not Started o If applicable, is the correct biosimilar selected based on the patient's insurance? not applicable  Organ Function and Labs: Marland Kitchen Are dose adjustments needed based on the patient's renal function, hepatic function, or hematologic function? No . Are appropriate labs ordered prior to the start of patient's treatment? Yes . Other organ system assessment, if indicated: N/A . The following baseline labs, if indicated, have been ordered: N/A  Dose Assessment: . Are the drug doses appropriate? Yes . Are the following correct: o Drug concentrations Yes o IV fluid compatible with drug Yes o Administration routes Yes o Timing of therapy Yes . If applicable, does the patient have documented access for treatment and/or plans for port-a-cath placement? yes . If applicable, have lifetime cumulative doses been properly documented and assessed? yes Lifetime Dose Tracking  No doses have been documented on this patient for the following tracked chemicals: Doxorubicin, Epirubicin, Idarubicin, Daunorubicin, Mitoxantrone, Bleomycin, Oxaliplatin, Carboplatin, Liposomal Doxorubicin  o   Toxicity Monitoring/Prevention: . The patient has the following take home antiemetics prescribed: Prochlorperazine . The patient has the following take home medications prescribed: N/A . Medication allergies and previous infusion related reactions, if applicable, have been reviewed and addressed. Yes . The patient's current medication list has been assessed for drug-drug interactions with their chemotherapy regimen.  no significant drug-drug interactions were identified on review.  Order Review: . Are the treatment plan orders signed? Yes . Is the patient scheduled to see a provider prior to their treatment? No  I verify that I have reviewed each item in the above checklist and answered each question accordingly.   Kennith Center, Pharm.D., CPP 09/12/2020@4 :00 PM

## 2020-09-13 ENCOUNTER — Ambulatory Visit (HOSPITAL_COMMUNITY)
Admission: RE | Admit: 2020-09-13 | Discharge: 2020-09-13 | Disposition: A | Discharge status: Home / Self Care | Payer: Medicare HMO | Source: Ambulatory Visit | Attending: Internal Medicine | Admitting: Internal Medicine

## 2020-09-13 ENCOUNTER — Other Ambulatory Visit: Payer: Self-pay | Admitting: Radiology

## 2020-09-13 ENCOUNTER — Other Ambulatory Visit: Payer: Self-pay

## 2020-09-13 DIAGNOSIS — C3492 Malignant neoplasm of unspecified part of left bronchus or lung: Secondary | ICD-10-CM | POA: Diagnosis not present

## 2020-09-13 DIAGNOSIS — Z8679 Personal history of other diseases of the circulatory system: Secondary | ICD-10-CM | POA: Insufficient documentation

## 2020-09-13 DIAGNOSIS — J323 Chronic sphenoidal sinusitis: Secondary | ICD-10-CM | POA: Diagnosis not present

## 2020-09-13 DIAGNOSIS — C3490 Malignant neoplasm of unspecified part of unspecified bronchus or lung: Secondary | ICD-10-CM | POA: Diagnosis not present

## 2020-09-13 DIAGNOSIS — I671 Cerebral aneurysm, nonruptured: Secondary | ICD-10-CM | POA: Diagnosis not present

## 2020-09-13 MED ORDER — SODIUM CHLORIDE (PF) 0.9 % IJ SOLN
INTRAMUSCULAR | Status: AC
  Filled 2020-09-13: qty 50

## 2020-09-13 MED ORDER — IOHEXOL 300 MG/ML  SOLN
75.0000 mL | Freq: Once | INTRAMUSCULAR | Status: AC | PRN
  Administered 2020-09-13: 75 mL via INTRAVENOUS

## 2020-09-13 NOTE — Progress Notes (Signed)
Patient ID: Dawn Gomez, female   DOB: [birth year], 85 y.o.   MRN: [id number] Patient presented to [facility] radiology dept  today for CT head with IV contrast.  Patient subsequently developed extravasation at left arm IV site of approximately 30 cc contrast.  On exam there is some edema of the left anterior mid arm region along with erythema but no blistering; area is mildly tender to palpation, soft to touch.  Intact distal pulses and good motor function.  Recommend ice pack to region for 10 to 15-minute intervals 4 times a day as well as arm elevation when possible.  We will contact patient tomorrow at home for follow-up.  Extravasation discharge instructions given to patient.  Dr. Pascal Lux also examined patient.

## 2020-09-14 NOTE — Progress Notes (Addendum)
Patient ID: Dawn Gomez, female   DOB: [birth year], 85 y.o.   MRN: [id number] Attempted to contact pt today (6/9) to check status of left arm ,s/p contrast extravasation 6/8; NA; left VM to call us with any questions; otherwise she will be reevaluated when she arrives for port placement on 6/10

## 2020-09-15 ENCOUNTER — Other Ambulatory Visit: Payer: Self-pay | Admitting: Internal Medicine

## 2020-09-15 ENCOUNTER — Ambulatory Visit (HOSPITAL_COMMUNITY)
Admission: RE | Admit: 2020-09-15 | Discharge: 2020-09-15 | Disposition: A | Discharge status: Home / Self Care | Payer: Medicare HMO | Source: Ambulatory Visit | Attending: Internal Medicine | Admitting: Internal Medicine

## 2020-09-15 ENCOUNTER — Telehealth: Payer: Self-pay | Admitting: Internal Medicine

## 2020-09-15 ENCOUNTER — Other Ambulatory Visit: Payer: Self-pay

## 2020-09-15 DIAGNOSIS — C799 Secondary malignant neoplasm of unspecified site: Secondary | ICD-10-CM | POA: Diagnosis not present

## 2020-09-15 DIAGNOSIS — Z87891 Personal history of nicotine dependence: Secondary | ICD-10-CM | POA: Diagnosis not present

## 2020-09-15 DIAGNOSIS — Z452 Encounter for adjustment and management of vascular access device: Secondary | ICD-10-CM | POA: Diagnosis not present

## 2020-09-15 DIAGNOSIS — Z79890 Hormone replacement therapy (postmenopausal): Secondary | ICD-10-CM | POA: Diagnosis not present

## 2020-09-15 DIAGNOSIS — C7951 Secondary malignant neoplasm of bone: Secondary | ICD-10-CM | POA: Insufficient documentation

## 2020-09-15 DIAGNOSIS — Z885 Allergy status to narcotic agent status: Secondary | ICD-10-CM | POA: Diagnosis not present

## 2020-09-15 DIAGNOSIS — Z881 Allergy status to other antibiotic agents status: Secondary | ICD-10-CM | POA: Insufficient documentation

## 2020-09-15 DIAGNOSIS — Z79899 Other long term (current) drug therapy: Secondary | ICD-10-CM | POA: Diagnosis not present

## 2020-09-15 DIAGNOSIS — Z888 Allergy status to other drugs, medicaments and biological substances status: Secondary | ICD-10-CM | POA: Diagnosis not present

## 2020-09-15 DIAGNOSIS — C787 Secondary malignant neoplasm of liver and intrahepatic bile duct: Secondary | ICD-10-CM | POA: Insufficient documentation

## 2020-09-15 DIAGNOSIS — Z801 Family history of malignant neoplasm of trachea, bronchus and lung: Secondary | ICD-10-CM | POA: Insufficient documentation

## 2020-09-15 DIAGNOSIS — C3432 Malignant neoplasm of lower lobe, left bronchus or lung: Secondary | ICD-10-CM | POA: Diagnosis not present

## 2020-09-15 DIAGNOSIS — C3492 Malignant neoplasm of unspecified part of left bronchus or lung: Secondary | ICD-10-CM | POA: Diagnosis not present

## 2020-09-15 DIAGNOSIS — Z803 Family history of malignant neoplasm of breast: Secondary | ICD-10-CM | POA: Diagnosis not present

## 2020-09-15 HISTORY — PX: IR IMAGING GUIDED PORT INSERTION: IMG5740

## 2020-09-15 LAB — CBC WITH DIFFERENTIAL/PLATELET
Abs Immature Granulocytes: 0.01 10*3/uL (ref 0.00–0.07)
Basophils Absolute: 0 10*3/uL (ref 0.0–0.1)
Basophils Relative: 1 %
Eosinophils Absolute: 0.1 10*3/uL (ref 0.0–0.5)
Eosinophils Relative: 3 %
HCT: 34.4 % — ABNORMAL LOW (ref 36.0–46.0)
Hemoglobin: 11.1 g/dL — ABNORMAL LOW (ref 12.0–15.0)
Immature Granulocytes: 0 %
Lymphocytes Relative: 23 %
Lymphs Abs: 1.1 10*3/uL (ref 0.7–4.0)
MCH: 30.2 pg (ref 26.0–34.0)
MCHC: 32.3 g/dL (ref 30.0–36.0)
MCV: 93.7 fL (ref 80.0–100.0)
Monocytes Absolute: 0.5 10*3/uL (ref 0.1–1.0)
Monocytes Relative: 11 %
Neutro Abs: 3 10*3/uL (ref 1.7–7.7)
Neutrophils Relative %: 62 %
Platelets: 275 10*3/uL (ref 150–400)
RBC: 3.67 MIL/uL — ABNORMAL LOW (ref 3.87–5.11)
RDW: 14.3 % (ref 11.5–15.5)
WBC: 4.8 10*3/uL (ref 4.0–10.5)
nRBC: 0 % (ref 0.0–0.2)

## 2020-09-15 MED ORDER — LIDOCAINE HCL 1 % IJ SOLN
INTRAMUSCULAR | Status: AC
  Filled 2020-09-15: qty 20

## 2020-09-15 MED ORDER — LIDOCAINE HCL 1 % IJ SOLN
INTRAMUSCULAR | Status: AC | PRN
  Administered 2020-09-15: 15 mL

## 2020-09-15 MED ORDER — HEPARIN SOD (PORK) LOCK FLUSH 100 UNIT/ML IV SOLN
INTRAVENOUS | Status: AC
  Filled 2020-09-15: qty 5

## 2020-09-15 MED ORDER — FENTANYL CITRATE (PF) 100 MCG/2ML IJ SOLN
INTRAMUSCULAR | Status: AC
  Filled 2020-09-15: qty 2

## 2020-09-15 MED ORDER — MIDAZOLAM HCL 2 MG/2ML IJ SOLN
INTRAMUSCULAR | Status: AC
  Filled 2020-09-15: qty 2

## 2020-09-15 MED ORDER — SODIUM CHLORIDE 0.9 % IV SOLN: INTRAVENOUS | Status: DC

## 2020-09-15 MED ORDER — MIDAZOLAM HCL 2 MG/2ML IJ SOLN
INTRAMUSCULAR | Status: AC | PRN
  Administered 2020-09-15: 0.5 mg via INTRAVENOUS
  Administered 2020-09-15: 1 mg via INTRAVENOUS
  Administered 2020-09-15: 0.5 mg via INTRAVENOUS

## 2020-09-15 MED ORDER — FENTANYL CITRATE (PF) 100 MCG/2ML IJ SOLN
INTRAMUSCULAR | Status: AC | PRN
  Administered 2020-09-15: 50 ug via INTRAVENOUS
  Administered 2020-09-15 (×2): 25 ug via INTRAVENOUS

## 2020-09-15 NOTE — Discharge Instructions (Signed)
Urgent needs - Interventional Radiology on call MD [phone]  Wound - May remove dressing and shower in 24 to 48 hours.  Keep site clean and dry.  Replace with bandaid as needed.  Do not submerge in tub or water until site healing well. If closed with glue, glue will flake off on its own.  If ordered by your provider, may start Emla cream in 2 weeks or after incision is healed.  After completion of treatment, your provider should have you set up for monthly port flushes.   

## 2020-09-15 NOTE — Procedures (Signed)
Interventional Radiology Procedure Note  Procedure: Single Lumen Power Port Placement    Access:  Right IJ vein.  Findings: Catheter tip positioned at SVC/RA junction. Port is ready for immediate use.   Complications: None  EBL: < 10 mL  Recommendations:  - Ok to shower in 24 hours - Do not submerge for 7 days - Routine line care   Ytzel Gubler T. Ermal Brzozowski, M.D Pager:  [phone]   

## 2020-09-15 NOTE — Telephone Encounter (Signed)
Called to confirm all upcoming appts. Left detailed msg.

## 2020-09-15 NOTE — H&P (Signed)
Chief Complaint: Patient was seen in consultation today for port-a-catheter placement  Referring Physician(s): Mohamed,Mohamed  Supervising Physician: Aletta Edouard  Patient Status: [facility] - Out[facility]  History of Present Illness: Dawn Gomez is an 85 y.o. female with a medical history significant for aortic aneurysm, SVT, OSA, psoriasis, and pulmonary fibrosis. She is followed by pulmonology and a CT chest was performed Aug 14, 2020 to evaluate for worsening shortness of breath. The scan showed a large, left lower lobe mass with metastases. She is familiar to IR for a liver biopsy on 08/29/20; pathology positive for small cell carcinoma. Her oncology team is preparing her for chemotherapy/immunotherapy.   Interventional Radiology has been asked to evaluate this patient for an image-guided port-a-catheter placement to facilitate her treatment plans.   Past Medical History:  Diagnosis Date   Abnormal heart rhythm    Aortic aneurysm (HCC)    Aortic atherosclerosis ([place]) 03/03/2019   Dyspnea    Hypothyroid    OSA (obstructive sleep apnea)    on cpap   Psoriasis 02/05/2017   Pulmonary fibrosis (HCC)    SVT (supraventricular tachycardia) (HCC)    Thyroid disease    hypo    Past Surgical History:  Procedure Laterality Date   ABDOMINAL HYSTERECTOMY     APPENDECTOMY     CHOLECYSTECTOMY     CRANIOTOMY     for aneurysms   CRANIOTOMY  1980 x 2   aneurysmal clipping   HERNIA REPAIR     TOTAL ABDOMINAL HYSTERECTOMY     TUBAL LIGATION      Allergies: Lipitor [atorvastatin], Codeine, Hydromorphone hcl, Levaquin [levofloxacin in d5w], Morphine, Oxycodone-acetaminophen, Propoxyphene n-acetaminophen, and Simvastatin  Medications: Prior to Admission medications   Medication Sig Start Date End Date Taking? Authorizing Provider  acetaminophen (TYLENOL) 500 MG tablet Take 500 mg by mouth every 6 (six) hours as needed.    [provider]  Ascorbic Acid (VITAMIN C PO)  Take by mouth.    [provider]  Azelastine-Fluticasone 137-50 MCG/ACT SUSP Place 1 spray into the nose every 12 (twelve) hours. 08/17/20   Biagio Borg, MD  Blood Glucose Monitoring Suppl (TRUE METRIX METER) w/Device KIT USE AS DIRECTED 06/22/20   Biagio Borg, MD  CALCIUM-MAGNESIUM-ZINC PO Take 1 tablet by mouth daily.    [provider]  cholecalciferol (VITAMIN D) 1000 units tablet Take 5,000 Units by mouth daily.    [provider]  diltiazem (CARDIZEM CD) 180 MG 24 hr capsule Take 1 capsule (180 mg total) by mouth daily. 04/13/20   Biagio Borg, MD  escitalopram (LEXAPRO) 20 MG tablet Take 1 tablet (20 mg total) by mouth at bedtime. 04/13/20   Biagio Borg, MD  fenofibrate 160 MG tablet Take 1 tablet (160 mg total) by mouth daily. 04/13/20   Biagio Borg, MD  fluocinonide (LIDEX) 0.05 % external solution APP EXT AA OF SCALP NIGHTLY 11/07/17   [provider]  furosemide (LASIX) 80 MG tablet Take 1 tablet (80 mg total) by mouth daily. 04/13/20   Biagio Borg, MD  guaiFENesin (MUCINEX) 600 MG 12 hr tablet Take 2 tablets (1,200 mg total) by mouth 2 (two) times daily as needed. 02/08/20   Biagio Borg, MD  HYDROcodone-acetaminophen (NORCO/VICODIN) 5-325 MG tablet Take 1 tablet by mouth every 6 (six) hours as needed. 08/17/20   Biagio Borg, MD  ipratropium-albuterol (DUONEB) 0.5-2.5 (3) MG/3ML SOLN Take 3 mLs by nebulization every 4 (four) hours as needed. 08/10/16  Domenic Polite, MD  levothyroxine (SYNTHROID) 100 MCG tablet Take 1 tablet (100 mcg total) by mouth daily before breakfast. 04/13/20   Biagio Borg, MD  lidocaine-prilocaine (EMLA) cream 30 Squeeze  1/2 tsp on a cotton ball and apply to skin over port a cath site. Do not rub it in. Cover with plastic wrap. Apply 1-2 hours prior to your treatment. 09/06/20   Curt Bears, MD  lisinopril (ZESTRIL) 5 MG tablet Take 1 tablet (5 mg total) by mouth daily. 04/13/20   Biagio Borg, MD  potassium chloride SA  (KLOR-CON) 20 MEQ tablet TAKE 1 TABLET EVERY DAY WITH FOOD 04/13/20   Biagio Borg, MD  prochlorperazine (COMPAZINE) 10 MG tablet Take 1 tablet (10 mg total) by mouth every 6 (six) hours as needed for nausea or vomiting. 08/31/20   Curt Bears, MD  rosuvastatin (CRESTOR) 20 MG tablet Take 1 tablet (20 mg total) by mouth daily. 04/13/20   Biagio Borg, MD  tiZANidine (ZANAFLEX) 2 MG tablet Take 1 tablet (2 mg total) by mouth 3 (three) times daily. 04/13/20   Biagio Borg, MD  traMADol (ULTRAM) 50 MG tablet Take 1 tablet (50 mg total) by mouth 4 (four) times daily. 04/13/20   Biagio Borg, MD  traZODone (DESYREL) 50 MG tablet Take 1 tablet (50 mg total) by mouth at bedtime as needed for sleep. 04/13/20   Biagio Borg, MD  vitamin B-12 (CYANOCOBALAMIN) 1000 MCG tablet Take 500 mcg by mouth daily.    [provider]  vitamin C (ASCORBIC ACID) 500 MG tablet Take 500 mg by mouth daily.    [provider]  Vitamin D, Ergocalciferol, (DRISDOL) 50000 units CAPS capsule Take 1 capsule (50,000 Units total) by mouth every 7 (seven) days. 04/10/17   Biagio Borg, MD  vitamin E 400 UNIT capsule Take 200 Units by mouth daily.    [provider]     Family History  Problem Relation Age of Onset   Emphysema Father    Heart disease Father    Clotting disorder Father    Rheum arthritis Father    Lung cancer Father    Prostate cancer Father    Bone cancer Father    Emphysema Sister    Emphysema Mother    Heart disease Mother    Rheum arthritis Mother    Lung cancer Mother    Allergies Sister    Clotting disorder Sister    Rheum arthritis Sister    Breast cancer Sister    Cancer Sister        ureter    Social History   Socioeconomic History   Marital status: Single    Spouse name: Not on file   Number of children: 1   Years of education: 16   Highest education level: Not on file  Occupational History   Occupation: retired Therapist, sports   Occupation: retired  Tobacco Use    Smoking status: Former    Packs/day: 2.00    Years: 50.00    Pack years: 100.00    Types: Cigarettes    Quit date: 2008    Years since quitting: 14.4   Smokeless tobacco: Never  Substance and Sexual Activity   Alcohol use: Yes    Comment: "drank back in her younger wilder days"   Drug use: No   Sexual activity: Not on file  Other Topics Concern   Not on file  Social History Narrative   ** Merged History Encounter **  Social Determinants of Health   Financial Resource Strain: Not on file  Food Insecurity: Not on file  Transportation Needs: Not on file  Physical Activity: Not on file  Stress: Not on file  Social Connections: Not on file    Review of Systems: A 12 point ROS discussed and pertinent positives are indicated in the HPI above.  All other systems are negative.  Review of Systems  Constitutional:  Positive for fatigue. Negative for appetite change.  Respiratory:  Positive for cough and shortness of breath.        Home O2  Gastrointestinal:  Positive for abdominal pain and nausea. Negative for diarrhea and vomiting.  Skin:  Positive for color change.       Left forearm contrast extravasation from CT scan a few days ago.   Neurological:  Negative for dizziness and headaches.   Vital Signs: 98.2, BP 101/52, HR 56, RR 18 91% on 4 L  Physical Exam Constitutional:      General: She is not in acute distress. HENT:     Mouth/Throat:     Mouth: Mucous membranes are moist.     Pharynx: Oropharynx is clear.  Cardiovascular:     Rate and Rhythm: Normal rate and regular rhythm.  Pulmonary:     Effort: Pulmonary effort is normal.     Breath sounds: Normal breath sounds.     Comments: Patient reports hemoptysis during heavy coughing episodes.  Abdominal:     General: Bowel sounds are normal.     Palpations: Abdomen is soft.     Tenderness: There is abdominal tenderness.     Comments: RUQ tenderness  Skin:    General: Skin is warm and dry.     Comments: Left  forearm IV site from CT scan has mild tenderness and a large area of erythema/discoloration. No blistering. Hand is warm with good capillary refill. Patient denies numbness or tingling.   Neurological:     Mental Status: She is alert and oriented to person, place, and time.    Imaging: DG Thoracic Spine 2 View  Result Date: 08/19/2020 CLINICAL DATA:  Pain near bra line for 2 weeks after a fall backwards onto cement stairs, mid to low back pain for 2 weeks EXAM: THORACIC SPINE 2 VIEWS COMPARISON:  02/05/2017 FINDINGS: Osseous demineralization. Multilevel disc space narrowing and endplate spur formation. Vertebral body heights maintained. No fracture, subluxation, or bone destruction. Atherosclerotic calcifications aorta. Extensive chronic interstitial lung disease/fibrosis. IMPRESSION: Osseous demineralization with multilevel degenerative disc disease changes thoracic spine. No acute osseous abnormalities. Chronic interstitial lung disease/pulmonary fibrosis, progressive since 2018. Aortic Atherosclerosis (ICD10-I70.0). Electronically Signed   By: Lavonia Dana M.D.   On: 08/19/2020 13:06   DG Lumbar Spine Complete  Result Date: 08/19/2020 CLINICAL DATA:  Mid to lower back pain for 2 weeks post fall backwards onto cement stairs EXAM: LUMBAR SPINE - COMPLETE 4+ VIEW COMPARISON:  12/23/2019 FINDINGS: Five non-rib-bearing lumbar vertebra. Multilevel facet degenerative changes. Vertebral body heights maintained. Scattered disc space narrowing with tiny endplate spurs at F35-K5. No fracture, subluxation, or bone destruction. No spondylolysis. SI joints preserved. Extensive atherosclerotic calcifications aorta and iliac arteries. IMPRESSION: Osseous demineralization with degenerative disc and facet disease changes of thoracolumbar spine. No acute abnormalities. Electronically Signed   By: Lavonia Dana M.D.   On: 08/19/2020 13:08   CT Head W Wo Contrast  Result Date: 09/14/2020 CLINICAL DATA:  Non-small cell  lung cancer staging. Per patient, has a history of brain aneurysm with  clips EXAM: CT HEAD WITHOUT AND WITH CONTRAST TECHNIQUE: Contiguous axial images were obtained from the base of the skull through the vertex without and with intravenous contrast CONTRAST:  44m OMNIPAQUE IOHEXOL 300 MG/ML  SOLN COMPARISON:  Aug 11, 2007. FINDINGS: Initial attempt at postcontrast imaging failed due to contrast extravasation. Repeat imaging was performed successfully with contrast. Brain: Left temporal lobe encephalomalacia with aneurysm clip in this region and in the region of the anterior communicating artery. Surrounding streak artifact limits evaluation. No clear metastatic disease. Focus of enhancement in the right parietal cortex on axial imaging appears curvilinear on reformatted imaging and therefore is favored to represent a vessel. No evidence of acute large vascular territory infarct. No hydrocephalus, acute hemorrhage, mass lesion, or abnormal mass effect. Mild to moderate scattered white matter hypoattenuation is nonspecific but most likely related to chronic microvascular ischemic disease. Vascular: No hyperdense vessel or unexpected calcification. Left MCA and ACA vessels are obscured in the region of the aneurysm clips. Skull: Left frontotemporal craniectomy with cranioplasty. No evidence of acute fracture or suspicious bone lesion. Sinuses/Orbits: Mild ethmoid air cell mucosal thickening. Frothy secretions in the left sphenoid sinus with bilateral sphenoid sinus air-fluid levels. Unremarkable orbits. Other: Small right mastoid effusion. IMPRESSION: 1. No evidence of acute intracranial abnormality or metastatic disease within the sensitivity of CT technique and the limitation of streak artifact from aneurysm clips. 2. Similar left frontotemporal craniectomy with cranioplasty, anterior left temporal encephalomalacia, and aneurysm clips. 3. Sphenoethmoidal mucosal thickening with sphenoid air-fluid levels and frothy  secretions. Electronically Signed   By: FMargaretha SheffieldMD   On: 09/14/2020 07:12   NM PET Image Initial (PI) Skull Base To Thigh  Result Date: 09/05/2020 CLINICAL DATA:  Initial treatment strategy for suspected pulmonary neoplasm in a 85year old female found to have lung mass on recent chest CT. EXAM: NUCLEAR MEDICINE PET SKULL BASE TO THIGH TECHNIQUE: 7.18 mCi F-18 FDG was injected intravenously. Full-ring PET imaging was performed from the skull base to thigh after the radiotracer. CT data was obtained and used for attenuation correction and anatomic localization. Fasting blood glucose: 88 mg/dl COMPARISON:  CT of the chest from Aug 14, 2020. FINDINGS: Mediastinal blood pool activity: SUV max 2.81 Liver activity: SUV max NA NECK: No hypermetabolic lymph nodes in the neck. Incidental CT findings: none CHEST: Areas of adenopathy in the mediastinum and LEFT hilum contiguous with large LEFT pulmonary mass (image 70/4) maximum axial dimension approximately 7.2 x 5.9 cm, contiguous with pleural thickening that extends anteriorly and with bulky mediastinal adenopathy in the subcarinal region, AP window and along LEFT paratracheal chain. Maximum SUV of the dominant mass measuring 17 with hypermetabolic activity exhibited in mediastinal and LEFT hilar lymph nodes. (Image 63/4) 1.7 cm subcarinal nodal tissue with a maximum SUV of 10.4. LEFT paratracheal nodal tissue on image 56/4, dominant node at 8 mm with a maximum SUV of 12.8. Enlarged lymph node in the AP window (image 54/411 mm only with mild increased metabolic activity SUV difficult to measure but only slightly greater than adjacent mediastinal blood pool. No additional areas of frankly hypermetabolic activity in the chest. Incidental CT findings: Aortic atherosclerosis. No sign of aneurysmal dilation of the thoracic aorta. Heart size enlarged as on the prior study along with engorgement/enlargement of central pulmonary vessels. Limited assessment of  cardiovascular structures given lack of intravenous contrast. Signs of pulmonary emphysema and interstitial disease as on the recent chest CT. ABDOMEN/PELVIS: Evidence of hepatic metastatic disease. (Image 89/4) 3.2 cm lesion  in the central RIGHT/medial LEFT hepatic lobe with a maximum SUV of 10.1, slightly less metabolism but still with elevated metabolic activity in a small lesion near the IVC confluence (image 86/4 measuring approximately 1.6 cm. At least 6 additional lesions in the liver largest along the RIGHT hepatic margin measuring 3.2 cm on the noncontrast CT with a maximum SUV of 8.8 (image 101/4) Small lesion in the lateral segment of the LEFT hepatic lobe on image 105 of series 4 serves as an additional example of hepatic metastatic disease and measures 1.5 cm. Suspect smaller lesions in the dome of the RIGHT hemi liver on image 82 of series 4 as well. Portacaval lymph node measuring 12 mm short axis with a maximum SUV of 5.3 (image 102/4). No additional signs of adenopathy in the abdomen. Incidental CT findings: Hepatic metastatic disease as above. Post cholecystectomy. No acute findings relative to pancreas, spleen, adrenal glands and kidneys. Small hiatal hernia. Colonic diverticulosis. Calcified atheromatous plaque of the abdominal aorta with normal caliber abdominal aorta. Post abdominal wall reconstruction with mesh. Post hysterectomy. SKELETON: Areas of increased metabolic activity along the posterior LEFT and RIGHT iliac crest as well as within the sacrum compatible with metastatic foci. (Image 132/4) maximum SUV of 4.4 without discrete CT correlate other than very vague sclerosis in the S1 vertebral body measuring approximately 2 cm. Similar activity seen in the LEFT posterior iliac measuring approximately 1.3 cm. A second focus in the LEFT iliac crest posteriorly on image 127 of series 3 also approximately 1 cm. These areas without definitive CT correlate as is a small focus of increased  metabolic activity in the posterior RIGHT iliac on image 126 of series 4, this measures approximately 1.4 cm and shows maximum SUV of 4.1 Sclerosis of the RIGHT eighth rib along posterolateral aspect with a maximum SUV of 3.0 without signs of fracture. Heterogeneous areas of uptake in the lumbar spine with suspicious area in the posterior vertebral body on the RIGHT at L4 and in the anterior vertebral body along the RIGHT margin of L3. Maximum SUV in the posterior aspect of L4 approximately 3.3 Incidental CT findings: Spinal degenerative changes. Post LEFT frontotemporal craniotomy. IMPRESSION: Findings of small cell lung cancer with associated visceral and nodal metastases as described above. Multifocal liver metastatic disease in addition to abdominal nodal involvement. Multifocal bony metastatic disease as described. Electronically Signed   By: Zetta Bills M.D.   On: 09/05/2020 16:59   [place] CORE BIOPSY (LIVER)  Result Date: 08/29/2020 INDICATION: 85 year old with left lung mass and liver lesions. Tissue diagnosis is needed. EXAM: ULTRASOUND-GUIDED LIVER LESION BIOPSY MEDICATIONS: Moderate sedation ANESTHESIA/SEDATION: Moderate (conscious) sedation was employed during this procedure. A total of Versed 2.0 mg and Fentanyl 100 mcg was administered intravenously. Moderate Sedation Time: 13 minutes. The patient's level of consciousness and vital signs were monitored continuously by radiology nursing throughout the procedure under my direct supervision. FLUOROSCOPY TIME:  Fluoroscopy Time: None COMPLICATIONS: None immediate. PROCEDURE: Informed written consent was obtained from the patient after a thorough discussion of the procedural risks, benefits and alternatives. All questions were addressed. A timeout was performed prior to the initiation of the procedure. Liver was evaluated with ultrasound. Large lesion in the right hepatic lobe was targeted for biopsy. Right abdominal subcostal region was prepped with  chlorhexidine and sterile field was created. Maximal barrier sterile technique was utilized including caps, mask, sterile gowns, sterile gloves, sterile drape, hand hygiene and skin antiseptic. Skin was anesthetized with 1% lidocaine. A small  incision was made. Using ultrasound guidance, a 17 gauge coaxial needle was directed into the hepatic lesion and multiple core biopsies were obtained an 18 gauge device. Gel-Foam slurry was injected through the 17 gauge needle as it was removed. Bandage placed over the puncture site. FINDINGS: Several hepatic lesions are compatible with metastatic disease. Dominant lesion in the right hepatic lobe measures up to 3.3 cm and this lesion was targeted for biopsy. Needle position confirmed within the lesion. Adequate core specimens obtained and placed in formalin. No immediate bleeding or hematoma formation. Gel-Foam slurry identified within the lesion at the end of the procedure. IMPRESSION: Ultrasound-guided core biopsy of a right hepatic lesion. Electronically Signed   By: Markus Daft M.D.   On: 08/29/2020 15:41    Labs:  CBC: Recent Labs    08/17/20 1437 08/29/20 1201 08/31/20 1326 09/15/20 1336  WBC 5.1 7.5 6.4 4.8  HGB 11.1* 11.2* 10.8* 11.1*  HCT 32.7* 35.3* 32.9* 34.4*  PLT 200.0 245 215 275    COAGS: Recent Labs    08/29/20 1201  INR 1.1    BMP: Recent Labs    12/17/19 1519 08/17/20 1437 08/31/20 1326  NA 130* 136 136  K 4.6 3.9 4.2  CL 88* 98 99  CO2 30 30 28   GLUCOSE 108* 80 101*  BUN 39* 16 15  CALCIUM 9.7 9.4 9.1  CREATININE 1.50* 0.85 1.05*  GFRNONAA 32*  --  53*  GFRAA 37*  --   --     LIVER FUNCTION TESTS: Recent Labs    12/17/19 1519 08/17/20 1437 08/31/20 1326  BILITOT 0.6 0.5 0.7  AST 18 18 21   ALT 13 11 13   ALKPHOS  --  42 49  PROT 6.9 6.7 6.1*  ALBUMIN  --  4.1 3.3*    TUMOR MARKERS: No results for input(s): AFPTM, CEA, CA199, CHROMGRNA in the last 8760 hours.  Assessment and Plan:  Small cell lung  cancer with metastases: Alfonse Alpers, 85 year old female, presents today to the [facility] department for an image-guided port-a-catheter placement.  Risks and benefits of image-guided port-a-catheter placement were discussed with the patient including, but not limited to bleeding, infection, pneumothorax, or fibrin sheath development and need for additional procedures.  All of the patient's questions were answered, patient is agreeable to proceed.  Consent signed and in chart.  Thank you for this interesting consult.  I greatly enjoyed meeting [facility] and look forward to participating in their care.  A copy of this report was sent to the requesting provider on this date.  Electronically Signed: Soyla Dryer, AGACNP-BC [phone] 09/15/2020, 2:04 PM   I spent a total of  30 Minutes   in face to face in clinical consultation, greater than 50% of which was counseling/coordinating care for port-a-catheter placement.

## 2020-09-18 ENCOUNTER — Inpatient Hospital Stay: Payer: Medicare HMO

## 2020-09-18 ENCOUNTER — Other Ambulatory Visit: Payer: Self-pay

## 2020-09-18 ENCOUNTER — Other Ambulatory Visit: Payer: Self-pay | Admitting: Internal Medicine

## 2020-09-18 VITALS — BP 104/45 | HR 63 | Temp 98.2°F | Resp 18

## 2020-09-18 DIAGNOSIS — Z95828 Presence of other vascular implants and grafts: Secondary | ICD-10-CM | POA: Insufficient documentation

## 2020-09-18 DIAGNOSIS — Z79899 Other long term (current) drug therapy: Secondary | ICD-10-CM | POA: Diagnosis not present

## 2020-09-18 DIAGNOSIS — C3412 Malignant neoplasm of upper lobe, left bronchus or lung: Secondary | ICD-10-CM | POA: Diagnosis not present

## 2020-09-18 DIAGNOSIS — Z90710 Acquired absence of both cervix and uterus: Secondary | ICD-10-CM | POA: Diagnosis not present

## 2020-09-18 DIAGNOSIS — C778 Secondary and unspecified malignant neoplasm of lymph nodes of multiple regions: Secondary | ICD-10-CM | POA: Diagnosis not present

## 2020-09-18 DIAGNOSIS — Z5111 Encounter for antineoplastic chemotherapy: Secondary | ICD-10-CM | POA: Diagnosis not present

## 2020-09-18 DIAGNOSIS — C7951 Secondary malignant neoplasm of bone: Secondary | ICD-10-CM | POA: Diagnosis not present

## 2020-09-18 DIAGNOSIS — C3432 Malignant neoplasm of lower lobe, left bronchus or lung: Secondary | ICD-10-CM

## 2020-09-18 DIAGNOSIS — R042 Hemoptysis: Secondary | ICD-10-CM | POA: Diagnosis not present

## 2020-09-18 DIAGNOSIS — C787 Secondary malignant neoplasm of liver and intrahepatic bile duct: Secondary | ICD-10-CM | POA: Diagnosis not present

## 2020-09-18 LAB — CBC WITH DIFFERENTIAL (CANCER CENTER ONLY)
Abs Immature Granulocytes: 0.01 10*3/uL (ref 0.00–0.07)
Basophils Absolute: 0 10*3/uL (ref 0.0–0.1)
Basophils Relative: 1 %
Eosinophils Absolute: 0.1 10*3/uL (ref 0.0–0.5)
Eosinophils Relative: 3 %
HCT: 30.6 % — ABNORMAL LOW (ref 36.0–46.0)
Hemoglobin: 10.1 g/dL — ABNORMAL LOW (ref 12.0–15.0)
Immature Granulocytes: 0 %
Lymphocytes Relative: 20 %
Lymphs Abs: 0.9 10*3/uL (ref 0.7–4.0)
MCH: 30.2 pg (ref 26.0–34.0)
MCHC: 33 g/dL (ref 30.0–36.0)
MCV: 91.6 fL (ref 80.0–100.0)
Monocytes Absolute: 0.6 10*3/uL (ref 0.1–1.0)
Monocytes Relative: 13 %
Neutro Abs: 2.8 10*3/uL (ref 1.7–7.7)
Neutrophils Relative %: 63 %
Platelet Count: 227 10*3/uL (ref 150–400)
RBC: 3.34 MIL/uL — ABNORMAL LOW (ref 3.87–5.11)
RDW: 14 % (ref 11.5–15.5)
WBC Count: 4.5 10*3/uL (ref 4.0–10.5)
nRBC: 0 % (ref 0.0–0.2)

## 2020-09-18 LAB — CMP (CANCER CENTER ONLY)
ALT: 17 U/L (ref 0–44)
AST: 32 U/L (ref 15–41)
Albumin: 3.2 g/dL — ABNORMAL LOW (ref 3.5–5.0)
Alkaline Phosphatase: 45 U/L (ref 38–126)
Anion gap: 11 (ref 5–15)
BUN: 14 mg/dL (ref 8–23)
CO2: 29 mmol/L (ref 22–32)
Calcium: 8.8 mg/dL — ABNORMAL LOW (ref 8.9–10.3)
Chloride: 100 mmol/L (ref 98–111)
Creatinine: 0.95 mg/dL (ref 0.44–1.00)
GFR, Estimated: 60 mL/min — ABNORMAL LOW (ref >60)
Glucose, Bld: 84 mg/dL (ref 70–99)
Potassium: 3.9 mmol/L (ref 3.5–5.1)
Sodium: 140 mmol/L (ref 135–145)
Total Bilirubin: 0.4 mg/dL (ref 0.3–1.2)
Total Protein: 6.1 g/dL — ABNORMAL LOW (ref 6.5–8.1)

## 2020-09-18 MED ORDER — PALONOSETRON HCL INJECTION 0.25 MG/5ML
0.2500 mg | Freq: Once | INTRAVENOUS | Status: AC
Start: 2020-09-18 — End: 2020-09-18
  Administered 2020-09-18: 0.25 mg via INTRAVENOUS

## 2020-09-18 MED ORDER — TRILACICLIB DIHYDROCHLORIDE INJECTION 300 MG
240.0000 mg/m2 | Freq: Once | INTRAVENOUS | Status: AC
  Administered 2020-09-18: 405 mg via INTRAVENOUS
  Filled 2020-09-18: qty 27

## 2020-09-18 MED ORDER — SODIUM CHLORIDE 0.9 % IV SOLN
Freq: Once | INTRAVENOUS | Status: AC
  Filled 2020-09-18: qty 250

## 2020-09-18 MED ORDER — SODIUM CHLORIDE 0.9% FLUSH
10.0000 mL | Freq: Once | INTRAVENOUS | Status: AC
  Administered 2020-09-18: 10 mL
  Filled 2020-09-18: qty 10

## 2020-09-18 MED ORDER — FOSAPREPITANT DIMEGLUMINE INJECTION 150 MG
150.0000 mg | Freq: Once | INTRAVENOUS | Status: AC
  Administered 2020-09-18: 150 mg via INTRAVENOUS
  Filled 2020-09-18: qty 5
  Filled 2020-09-18: qty 150

## 2020-09-18 MED ORDER — SODIUM CHLORIDE 0.9 % IV SOLN
344.0000 mg | Freq: Once | INTRAVENOUS | Status: AC
  Administered 2020-09-18: 340 mg via INTRAVENOUS
  Filled 2020-09-18: qty 34

## 2020-09-18 MED ORDER — PALONOSETRON HCL INJECTION 0.25 MG/5ML
INTRAVENOUS | Status: AC
  Filled 2020-09-18: qty 5

## 2020-09-18 MED ORDER — SODIUM CHLORIDE 0.9 % IV SOLN
10.0000 mg | Freq: Once | INTRAVENOUS | Status: AC
  Administered 2020-09-18: 10 mg via INTRAVENOUS
  Filled 2020-09-18: qty 1
  Filled 2020-09-18: qty 10

## 2020-09-18 MED ORDER — SODIUM CHLORIDE 0.9 % IV SOLN
100.0000 mg/m2 | Freq: Once | INTRAVENOUS | Status: AC
  Administered 2020-09-18: 170 mg via INTRAVENOUS
  Filled 2020-09-18: qty 8.5

## 2020-09-18 MED ORDER — DEXAMETHASONE 4 MG PO TABS
ORAL_TABLET | ORAL | Status: AC
  Filled 2020-09-18: qty 5

## 2020-09-18 NOTE — Patient Instructions (Signed)
[facility] ONCOLOGY  Discharge Instructions: Thank you for choosing [facility] to provide your oncology and hematology care.   If you have a lab appointment with the [facility], please go directly to the [facility] and check in at the registration area.   Wear comfortable clothing and clothing appropriate for easy access to any Portacath or PICC line.   We strive to give you quality time with your provider. You may need to reschedule your appointment if you arrive late (15 or more minutes).  Arriving late affects you and other patients whose appointments are after yours.  Also, if you miss three or more appointments without notifying the office, you may be dismissed from the clinic at the provider's discretion.      For prescription refill requests, have your pharmacy contact our office and allow 72 hours for refills to be completed.    Today you received the following chemotherapy and/or immunotherapy agents Etoposide, Carboplatin       To help prevent nausea and vomiting after your treatment, we encourage you to take your nausea medication as directed.  BELOW ARE SYMPTOMS THAT SHOULD BE REPORTED IMMEDIATELY: *FEVER GREATER THAN 100.4 F (38 C) OR HIGHER *CHILLS OR SWEATING *NAUSEA AND VOMITING THAT IS NOT CONTROLLED WITH YOUR NAUSEA MEDICATION *UNUSUAL SHORTNESS OF BREATH *UNUSUAL BRUISING OR BLEEDING *URINARY PROBLEMS (pain or burning when urinating, or frequent urination) *BOWEL PROBLEMS (unusual diarrhea, constipation, pain near the anus) TENDERNESS IN MOUTH AND THROAT WITH OR WITHOUT PRESENCE OF ULCERS (sore throat, sores in mouth, or a toothache) UNUSUAL RASH, SWELLING OR PAIN  UNUSUAL VAGINAL DISCHARGE OR ITCHING   Items with * indicate a potential emergency and should be followed up as soon as possible or go to the Emergency Department if any problems should occur.  Please show the CHEMOTHERAPY ALERT CARD or IMMUNOTHERAPY ALERT CARD at  check-in to the Emergency Department and triage nurse.  Should you have questions after your visit or need to cancel or reschedule your appointment, please contact [facility]  Dept: [phone]  and follow the prompts.  Office hours are 8:00 a.m. to 4:30 p.m. Monday - Friday. Please note that voicemails left after 4:00 p.m. may not be returned until the following business day.  We are closed weekends and major holidays. You have access to a nurse at all times for urgent questions. Please call the main number to the clinic Dept: [phone] and follow the prompts.   For any non-urgent questions, you may also contact your provider using MyChart. We now offer e-Visits for anyone 92 and older to request care online for non-urgent symptoms. For details visit [email or web address].   Also download the MyChart app! Go to the app store, search "MyChart", open the app, select [facility], and log in with your MyChart username and password.  Due to Covid, a mask is required upon entering the hospital/clinic. If you do not have a mask, one will be given to you upon arrival. For doctor visits, patients may have 1 support person aged 100 or older with them. For treatment visits, patients cannot have anyone with them due to current Covid guidelines and our immunocompromised population.

## 2020-09-19 ENCOUNTER — Inpatient Hospital Stay: Payer: Medicare HMO

## 2020-09-19 ENCOUNTER — Encounter: Payer: Self-pay | Admitting: General Practice

## 2020-09-19 VITALS — BP 105/51 | HR 77 | Temp 97.8°F | Resp 18

## 2020-09-19 DIAGNOSIS — C7951 Secondary malignant neoplasm of bone: Secondary | ICD-10-CM | POA: Diagnosis not present

## 2020-09-19 DIAGNOSIS — C778 Secondary and unspecified malignant neoplasm of lymph nodes of multiple regions: Secondary | ICD-10-CM | POA: Diagnosis not present

## 2020-09-19 DIAGNOSIS — C3412 Malignant neoplasm of upper lobe, left bronchus or lung: Secondary | ICD-10-CM | POA: Diagnosis not present

## 2020-09-19 DIAGNOSIS — Z5111 Encounter for antineoplastic chemotherapy: Secondary | ICD-10-CM | POA: Diagnosis not present

## 2020-09-19 DIAGNOSIS — C3432 Malignant neoplasm of lower lobe, left bronchus or lung: Secondary | ICD-10-CM

## 2020-09-19 DIAGNOSIS — C787 Secondary malignant neoplasm of liver and intrahepatic bile duct: Secondary | ICD-10-CM | POA: Diagnosis not present

## 2020-09-19 DIAGNOSIS — R042 Hemoptysis: Secondary | ICD-10-CM | POA: Diagnosis not present

## 2020-09-19 DIAGNOSIS — Z79899 Other long term (current) drug therapy: Secondary | ICD-10-CM | POA: Diagnosis not present

## 2020-09-19 DIAGNOSIS — Z90710 Acquired absence of both cervix and uterus: Secondary | ICD-10-CM | POA: Diagnosis not present

## 2020-09-19 MED ORDER — SODIUM CHLORIDE 0.9 % IV SOLN
Freq: Once | INTRAVENOUS | Status: AC
  Filled 2020-09-19: qty 250

## 2020-09-19 MED ORDER — SODIUM CHLORIDE 0.9 % IV SOLN
10.0000 mg | Freq: Once | INTRAVENOUS | Status: AC
  Administered 2020-09-19: 10 mg via INTRAVENOUS
  Filled 2020-09-19: qty 10

## 2020-09-19 MED ORDER — SODIUM CHLORIDE 0.9% FLUSH
10.0000 mL | INTRAVENOUS | Status: DC | PRN
  Administered 2020-09-19: 10 mL
  Filled 2020-09-19: qty 10

## 2020-09-19 MED ORDER — HEPARIN SOD (PORK) LOCK FLUSH 100 UNIT/ML IV SOLN
500.0000 [IU] | Freq: Once | INTRAVENOUS | Status: AC | PRN
  Administered 2020-09-19: 500 [IU]
  Filled 2020-09-19: qty 5

## 2020-09-19 MED ORDER — SODIUM CHLORIDE 0.9 % IV SOLN
100.0000 mg/m2 | Freq: Once | INTRAVENOUS | Status: AC
  Administered 2020-09-19: 170 mg via INTRAVENOUS
  Filled 2020-09-19: qty 8.5

## 2020-09-19 MED ORDER — TRILACICLIB DIHYDROCHLORIDE INJECTION 300 MG
240.0000 mg/m2 | Freq: Once | INTRAVENOUS | Status: AC
  Administered 2020-09-19: 405 mg via INTRAVENOUS
  Filled 2020-09-19: qty 27

## 2020-09-19 NOTE — Patient Instructions (Signed)
[facility]  Discharge Instructions: Thank you for choosing [facility] to provide your oncology and hematology care.   If you have a lab appointment with the [facility], please go directly to the [facility] and check in at the registration area.   Wear comfortable clothing and clothing appropriate for easy access to any Portacath or PICC line.   We strive to give you quality time with your provider. You may need to reschedule your appointment if you arrive late (15 or more minutes).  Arriving late affects you and other patients whose appointments are after yours.  Also, if you miss three or more appointments without notifying the office, you may be dismissed from the clinic at the provider's discretion.      For prescription refill requests, have your pharmacy contact our office and allow 72 hours for refills to be completed.    Today you received the following chemotherapy and/or immunotherapy agents Etoposide     To help prevent nausea and vomiting after your treatment, we encourage you to take your nausea medication as directed.  BELOW ARE SYMPTOMS THAT SHOULD BE REPORTED IMMEDIATELY: *FEVER GREATER THAN 100.4 F (38 C) OR HIGHER *CHILLS OR SWEATING *NAUSEA AND VOMITING THAT IS NOT CONTROLLED WITH YOUR NAUSEA MEDICATION *UNUSUAL SHORTNESS OF BREATH *UNUSUAL BRUISING OR BLEEDING *URINARY PROBLEMS (pain or burning when urinating, or frequent urination) *BOWEL PROBLEMS (unusual diarrhea, constipation, pain near the anus) TENDERNESS IN MOUTH AND THROAT WITH OR WITHOUT PRESENCE OF ULCERS (sore throat, sores in mouth, or a toothache) UNUSUAL RASH, SWELLING OR PAIN  UNUSUAL VAGINAL DISCHARGE OR ITCHING   Items with * indicate a potential emergency and should be followed up as soon as possible or go to the Emergency Department if any problems should occur.  Please show the CHEMOTHERAPY ALERT CARD or IMMUNOTHERAPY ALERT CARD at check-in to  the Emergency Department and triage nurse.  Should you have questions after your visit or need to cancel or reschedule your appointment, please contact [facility]  Dept: [phone]  and follow the prompts.  Office hours are 8:00 a.m. to 4:30 p.m. Monday - Friday. Please note that voicemails left after 4:00 p.m. may not be returned until the following business day.  We are closed weekends and major holidays. You have access to a nurse at all times for urgent questions. Please call the main number to the clinic Dept: [phone] and follow the prompts.   For any non-urgent questions, you may also contact your provider using MyChart. We now offer e-Visits for anyone 12 and older to request care online for non-urgent symptoms. For details visit [email or web address].   Also download the MyChart app! Go to the app store, search "MyChart", open the app, select [facility], and log in with your MyChart username and password.  Due to Covid, a mask is required upon entering the hospital/clinic. If you do not have a mask, one will be given to you upon arrival. For doctor visits, patients may have 1 support person aged 66 or older with them. For treatment visits, patients cannot have anyone with them due to current Covid guidelines and our immunocompromised population.

## 2020-09-19 NOTE — Progress Notes (Signed)
[facility] CSW Progress Notes  Call from Boyd, infusion room nurse, states that she wanted to speak w Education officer, museum.  Called patient, "I have some pictures about this house, we moved in here Feb 2011, we had to get this house and its been a financial problem since then and we cannot move out."  Concerned about environmental issues at her house, including leaky roof causing water damage.  She rents house and has asked landlord to help address issues.  Although some attempts have been made to repair roof, she believes that repairs have been inadequate.  Concerned about lead paint and mold in addition to water damage.    Is also concerned about a recent fall on outside steps at her home.  Concerned that landlord has not addressed damage from fallen tree that damaged railing.  Wants social worker to address whether "mold is connected with my cancer." Advised that she needs to talk to oncologist about this.  Wants resources for dealing with landlord issues and other means of support available to her.  Edwyna Shell, LCSW Clinical Social Worker Phone:  [phone]

## 2020-09-20 ENCOUNTER — Encounter: Payer: Self-pay | Admitting: *Deleted

## 2020-09-20 ENCOUNTER — Inpatient Hospital Stay: Payer: Medicare HMO

## 2020-09-20 ENCOUNTER — Encounter: Payer: Self-pay | Admitting: Internal Medicine

## 2020-09-20 ENCOUNTER — Other Ambulatory Visit: Payer: Self-pay

## 2020-09-20 VITALS — BP 116/59 | HR 65 | Temp 98.6°F | Resp 17

## 2020-09-20 DIAGNOSIS — R042 Hemoptysis: Secondary | ICD-10-CM | POA: Diagnosis not present

## 2020-09-20 DIAGNOSIS — Z5111 Encounter for antineoplastic chemotherapy: Secondary | ICD-10-CM | POA: Diagnosis not present

## 2020-09-20 DIAGNOSIS — C7951 Secondary malignant neoplasm of bone: Secondary | ICD-10-CM | POA: Diagnosis not present

## 2020-09-20 DIAGNOSIS — C3432 Malignant neoplasm of lower lobe, left bronchus or lung: Secondary | ICD-10-CM

## 2020-09-20 DIAGNOSIS — Z79899 Other long term (current) drug therapy: Secondary | ICD-10-CM | POA: Diagnosis not present

## 2020-09-20 DIAGNOSIS — C3412 Malignant neoplasm of upper lobe, left bronchus or lung: Secondary | ICD-10-CM | POA: Diagnosis not present

## 2020-09-20 DIAGNOSIS — Z90710 Acquired absence of both cervix and uterus: Secondary | ICD-10-CM | POA: Diagnosis not present

## 2020-09-20 DIAGNOSIS — C787 Secondary malignant neoplasm of liver and intrahepatic bile duct: Secondary | ICD-10-CM | POA: Diagnosis not present

## 2020-09-20 DIAGNOSIS — C778 Secondary and unspecified malignant neoplasm of lymph nodes of multiple regions: Secondary | ICD-10-CM | POA: Diagnosis not present

## 2020-09-20 MED ORDER — SODIUM CHLORIDE 0.9 % IV SOLN
Freq: Once | INTRAVENOUS | Status: AC
  Filled 2020-09-20: qty 250

## 2020-09-20 MED ORDER — SODIUM CHLORIDE 0.9% FLUSH
10.0000 mL | INTRAVENOUS | Status: DC | PRN
  Administered 2020-09-20: 10 mL
  Filled 2020-09-20: qty 10

## 2020-09-20 MED ORDER — HEPARIN SOD (PORK) LOCK FLUSH 100 UNIT/ML IV SOLN
500.0000 [IU] | Freq: Once | INTRAVENOUS | Status: AC | PRN
  Administered 2020-09-20: 500 [IU]
  Filled 2020-09-20: qty 5

## 2020-09-20 MED ORDER — SODIUM CHLORIDE 0.9 % IV SOLN
10.0000 mg | Freq: Once | INTRAVENOUS | Status: AC
  Administered 2020-09-20: 10 mg via INTRAVENOUS
  Filled 2020-09-20: qty 10

## 2020-09-20 MED ORDER — SODIUM CHLORIDE 0.9 % IV SOLN
100.0000 mg/m2 | Freq: Once | INTRAVENOUS | Status: AC
  Administered 2020-09-20: 170 mg via INTRAVENOUS
  Filled 2020-09-20: qty 8.5

## 2020-09-20 MED ORDER — TRILACICLIB DIHYDROCHLORIDE INJECTION 300 MG
240.0000 mg/m2 | Freq: Once | INTRAVENOUS | Status: AC
Start: 2020-09-20 — End: 2020-09-20
  Administered 2020-09-20: 405 mg via INTRAVENOUS
  Filled 2020-09-20: qty 27

## 2020-09-20 NOTE — Progress Notes (Signed)
Received call from patient regarding [employer]. Advised what is needed and examples of what is covered.  She will bring today at her appointment.   She has my card for any additional financial questions or concerns.

## 2020-09-20 NOTE — Patient Instructions (Signed)
[facility]COL[facility]  Discharge Instructions: Thank you for choosing [facility] to provide your oncology and hematology care.   If you have a lab appointment with the [facility], please go directly to the [facility] and check in at the registration area.   Wear comfortable clothing and clothing appropriate for easy access to any Portacath or PICC line.   We strive to give you quality time with your provider. You may need to reschedule your appointment if you arrive late (15 or more minutes).  Arriving late affects you and other patients whose appointments are after yours.  Also, if you miss three or more appointments without notifying the office, you may be dismissed from the clinic at the provider's discretion.      For prescription refill requests, have your pharmacy contact our office and allow 72 hours for refills to be completed.    Today you received the following chemotherapy and/or immunotherapy agents Etoposide      To help prevent nausea and vomiting after your treatment, we encourage you to take your nausea medication as directed.  BELOW ARE SYMPTOMS THAT SHOULD BE REPORTED IMMEDIATELY: *FEVER GREATER THAN 100.4 F (38 C) OR HIGHER *CHILLS OR SWEATING *NAUSEA AND VOMITING THAT IS NOT CONTROLLED WITH YOUR NAUSEA MEDICATION *UNUSUAL SHORTNESS OF BREATH *UNUSUAL BRUISING OR BLEEDING *URINARY PROBLEMS (pain or burning when urinating, or frequent urination) *BOWEL PROBLEMS (unusual diarrhea, constipation, pain near the anus) TENDERNESS IN MOUTH AND THROAT WITH OR WITHOUT PRESENCE OF ULCERS (sore throat, sores in mouth, or a toothache) UNUSUAL RASH, SWELLING OR PAIN  UNUSUAL VAGINAL DISCHARGE OR ITCHING   Items with * indicate a potential emergency and should be followed up as soon as possible or go to the Emergency Department if any problems should occur.  Please show the CHEMOTHERAPY ALERT CARD or IMMUNOTHERAPY ALERT CARD at check-in to  the Emergency Department and triage nurse.  Should you have questions after your visit or need to cancel or reschedule your appointment, please contact [facility]  Dept: [phone]  and follow the prompts.  Office hours are 8:00 a.m. to 4:30 p.m. Monday - Friday. Please note that voicemails left after 4:00 p.m. may not be returned until the following business day.  We are closed weekends and major holidays. You have access to a nurse at all times for urgent questions. Please call the main number to the clinic Dept: [phone] and follow the prompts.   For any non-urgent questions, you may also contact your provider using MyChart. We now offer e-Visits for anyone 86 and older to request care online for non-urgent symptoms. For details visit [email or web address].   Also download the MyChart app! Go to the app store, search "MyChart", open the app, select [facility], and log in with your MyChart username and password.  Due to Covid, a mask is required upon entering the hospital/clinic. If you do not have a mask, one will be given to you upon arrival. For doctor visits, patients may have 1 support person aged 89 or older with them. For treatment visits, patients cannot have anyone with them due to current Covid guidelines and our immunocompromised population.   Etoposide, VP-16 injection What is this medication? ETOPOSIDE, VP-16 (e toe POE side) is a chemotherapy drug. It is used to treattesticular cancer, lung cancer, and other cancers. This medicine may be used for other purposes; ask your health care provider orpharmacist if you have questions. COMMON BRAND NAME(S):  Etopophos, Toposar, VePesid What should I tell my care team before I take this medication? They need to know if you have any of these conditions: infection kidney disease liver disease low blood counts, like low white cell, platelet, or red cell counts an unusual or allergic reaction to  etoposide, other medicines, foods, dyes, or preservatives pregnant or trying to get pregnant breast-feeding How should I use this medication? This medicine is for infusion into a vein. It is administered in a hospital orclinic by a specially trained health care professional. Talk to your pediatrician regarding the use of this medicine in children.Special care may be needed. Overdosage: If you think you have taken too much of this medicine contact apoison control center or emergency room at once. NOTE: This medicine is only for you. Do not share this medicine with others. What if I miss a dose? It is important not to miss your dose. Call your doctor or health careprofessional if you are unable to keep an appointment. What may interact with this medication? This medicine may interact with the following medications: warfarin This list may not describe all possible interactions. Give your health care provider a list of all the medicines, herbs, non-prescription drugs, or dietary supplements you use. Also tell them if you smoke, drink alcohol, or use illegaldrugs. Some items may interact with your medicine. What should I watch for while using this medication? Visit your doctor for checks on your progress. This drug may make you feel generally unwell. This is not uncommon, as chemotherapy can affect healthy cells as well as cancer cells. Report any side effects. Continue your course oftreatment even though you feel ill unless your doctor tells you to stop. In some cases, you may be given additional medicines to help with side effects.Follow all directions for their use. Call your doctor or health care professional for advice if you get a fever, chills or sore throat, or other symptoms of a cold or flu. Do not treat yourself. This drug decreases your body's ability to fight infections. Try toavoid being around people who are sick. This medicine may increase your risk to bruise or bleed. Call your doctor  orhealth care professional if you notice any unusual bleeding. Talk to your doctor about your risk of cancer. You may be more at risk forcertain types of cancers if you take this medicine. Do not become pregnant while taking this medicine or for at least 6 months after stopping it. Women should inform their doctor if they wish to become pregnant or think they might be pregnant. Women of child-bearing potential will need to have a negative pregnancy test before starting this medicine. There is a potential for serious side effects to an unborn child. Talk to your health care professional or pharmacist for more information. Do not breast-feed aninfant while taking this medicine. Men must use a latex condom during sexual contact with a woman while taking this medicine and for at least 4 months after stopping it. A latex condom is needed even if you have had a vasectomy. Contact your doctor right away if your partner becomes pregnant. Do not donate sperm while taking this medicine and for at least 4 months after you stop taking this medicine. Men should inform their doctors if they wish to father a child. This medicine may lower spermcounts. What side effects may I notice from receiving this medication? Side effects that you should report to your doctor or health care professionalas soon as possible: allergic reactions like skin rash,  itching or hives, swelling of the face, lips, or tongue low blood counts - this medicine may decrease the number of white blood cells, red blood cells, and platelets. You may be at increased risk for infections and bleeding nausea, vomiting redness, blistering, peeling or loosening of the skin, including inside the mouth signs and symptoms of infection like fever; chills; cough; sore throat; pain or trouble passing urine signs and symptoms of low red blood cells or anemia such as unusually weak or tired; feeling faint or lightheaded; falls; breathing problems unusual bruising or  bleeding Side effects that usually do not require medical attention (report to yourdoctor or health care professional if they continue or are bothersome): changes in taste diarrhea hair loss loss of appetite mouth sores This list may not describe all possible side effects. Call your doctor for medical advice about side effects. You may report side effects to FDA at1-800-FDA-1088. Where should I keep my medication? This drug is given in a hospital or clinic and will not be stored at home. NOTE: This sheet is a summary. It may not cover all possible information. If you have questions about this medicine, talk to your doctor, pharmacist, orhealth care provider.  2022 Elsevier/Gold Standard (2018-05-20 16:57:15)

## 2020-09-20 NOTE — Progress Notes (Signed)
Met with patient/accompanying adult at registration to complete grant process.  Patient approved for one-time $1000 [employer] grant to assist with personal expenses while going through treatment. Discussed expenses and how they are covered. Gave her a copy of the approval letter and expense sheet along with the Outpatient pharmacy information. She received gift cards today from her grant.  She has my card for any additional financial questions or concerns in a green folder along with her copies of paperwork and gift cards.

## 2020-09-20 NOTE — Progress Notes (Signed)
Oncology Nurse Navigator Documentation  Oncology Nurse Navigator Flowsheets 09/20/2020 08/31/2020  Abnormal Finding Date - 08/14/2020  Confirmed Diagnosis Date - 08/29/2020  Diagnosis Status - Confirmed Diagnosis Complete  Planned Course of Treatment - Chemotherapy  Phase of Treatment Chemo Chemo  Chemotherapy Actual Start Date: 09/20/2020 -  Navigator Follow Up Date: 10/04/2020 09/05/2020  Navigator Follow Up Reason: Follow-up Appointment Appointment Review  Navigator Location [facility]  Referral Date to RadOnc/MedOnc - 08/23/2020  Navigator Encounter Type Treatment Clinic/MDC;Initial MedOnc  Treatment Initiated Date 09/20/2020 -  Patient Visit Type Other MedOnc;Initial  Treatment Phase Treatment Pre-Tx/Tx Discussion  Barriers/Navigation Needs - Education  Education - Newly Diagnosed Cancer Education;Other  Interventions Psycho-Social Support Education;Psycho-Social Support  Acuity Level 2-Minimal Needs (1-2 Barriers Identified) Level 2-Minimal Needs (1-2 Barriers Identified)  Education Method - Verbal;Written  Time Spent with Patient 56 15

## 2020-09-21 ENCOUNTER — Telehealth: Payer: Self-pay | Admitting: *Deleted

## 2020-09-21 ENCOUNTER — Encounter: Payer: Self-pay | Admitting: *Deleted

## 2020-09-22 ENCOUNTER — Inpatient Hospital Stay: Payer: Medicare HMO | Admitting: General Practice

## 2020-09-22 DIAGNOSIS — C3432 Malignant neoplasm of lower lobe, left bronchus or lung: Secondary | ICD-10-CM

## 2020-09-22 NOTE — Progress Notes (Addendum)
[facility] CSW Progress Notes  Call to patient to follow up on her concerns regarding her housing.  She has additional issues - she has received an eviction notice (7 day notice).  Referred to [facility] for Housing and [employer].  Provided patient with their contact information. Court date for eviction is 6/21 at 8 AM.  Encouraged patient to call ASAP.  CSW also left VM for this agency.  Patient is also very concerned about mold, broken railing and other environmental issues in the home.  Prepared packet to mail to patient w information about Healthy Homes program and [employer], both agencies which help w environmental modifications which promote  safe and healthy home environments.  Addendum:  Call from [facility] for Housing and [employer] - they will call patient and try to assist her with her eviction notice as well as helping her learn how to more effectively address her environmental concerns.  Edwyna Shell, LCSW Clinical Social Worker Phone:  [phone]

## 2020-09-26 ENCOUNTER — Inpatient Hospital Stay: Payer: Medicare HMO

## 2020-09-26 ENCOUNTER — Other Ambulatory Visit: Payer: Self-pay

## 2020-09-26 ENCOUNTER — Inpatient Hospital Stay (HOSPITAL_BASED_OUTPATIENT_CLINIC_OR_DEPARTMENT_OTHER): Payer: Medicare HMO | Admitting: Internal Medicine

## 2020-09-26 VITALS — BP 96/49 | HR 63 | Temp 97.0°F | Resp 18 | Wt 142.9 lb

## 2020-09-26 DIAGNOSIS — Z5111 Encounter for antineoplastic chemotherapy: Secondary | ICD-10-CM

## 2020-09-26 DIAGNOSIS — Z5112 Encounter for antineoplastic immunotherapy: Secondary | ICD-10-CM

## 2020-09-26 DIAGNOSIS — C3432 Malignant neoplasm of lower lobe, left bronchus or lung: Secondary | ICD-10-CM

## 2020-09-26 DIAGNOSIS — Z90710 Acquired absence of both cervix and uterus: Secondary | ICD-10-CM | POA: Diagnosis not present

## 2020-09-26 DIAGNOSIS — C787 Secondary malignant neoplasm of liver and intrahepatic bile duct: Secondary | ICD-10-CM | POA: Diagnosis not present

## 2020-09-26 DIAGNOSIS — C3412 Malignant neoplasm of upper lobe, left bronchus or lung: Secondary | ICD-10-CM | POA: Diagnosis not present

## 2020-09-26 DIAGNOSIS — R042 Hemoptysis: Secondary | ICD-10-CM | POA: Diagnosis not present

## 2020-09-26 DIAGNOSIS — Z79899 Other long term (current) drug therapy: Secondary | ICD-10-CM | POA: Diagnosis not present

## 2020-09-26 DIAGNOSIS — C7951 Secondary malignant neoplasm of bone: Secondary | ICD-10-CM | POA: Diagnosis not present

## 2020-09-26 DIAGNOSIS — C778 Secondary and unspecified malignant neoplasm of lymph nodes of multiple regions: Secondary | ICD-10-CM | POA: Diagnosis not present

## 2020-09-26 LAB — CBC WITH DIFFERENTIAL (CANCER CENTER ONLY)
Abs Immature Granulocytes: 0.03 10*3/uL (ref 0.00–0.07)
Basophils Absolute: 0 10*3/uL (ref 0.0–0.1)
Basophils Relative: 1 %
Eosinophils Absolute: 0 10*3/uL (ref 0.0–0.5)
Eosinophils Relative: 1 %
HCT: 29.3 % — ABNORMAL LOW (ref 36.0–46.0)
Hemoglobin: 9.7 g/dL — ABNORMAL LOW (ref 12.0–15.0)
Immature Granulocytes: 1 %
Lymphocytes Relative: 28 %
Lymphs Abs: 0.8 10*3/uL (ref 0.7–4.0)
MCH: 30 pg (ref 26.0–34.0)
MCHC: 33.1 g/dL (ref 30.0–36.0)
MCV: 90.7 fL (ref 80.0–100.0)
Monocytes Absolute: 0.1 10*3/uL (ref 0.1–1.0)
Monocytes Relative: 3 %
Neutro Abs: 1.9 10*3/uL (ref 1.7–7.7)
Neutrophils Relative %: 66 %
Platelet Count: 139 10*3/uL — ABNORMAL LOW (ref 150–400)
RBC: 3.23 MIL/uL — ABNORMAL LOW (ref 3.87–5.11)
RDW: 13.2 % (ref 11.5–15.5)
WBC Count: 2.8 10*3/uL — ABNORMAL LOW (ref 4.0–10.5)
nRBC: 0 % (ref 0.0–0.2)

## 2020-09-26 LAB — CMP (CANCER CENTER ONLY)
ALT: 18 U/L (ref 0–44)
AST: 27 U/L (ref 15–41)
Albumin: 3.2 g/dL — ABNORMAL LOW (ref 3.5–5.0)
Alkaline Phosphatase: 43 U/L (ref 38–126)
Anion gap: 8 (ref 5–15)
BUN: 16 mg/dL (ref 8–23)
CO2: 27 mmol/L (ref 22–32)
Calcium: 8.8 mg/dL — ABNORMAL LOW (ref 8.9–10.3)
Chloride: 102 mmol/L (ref 98–111)
Creatinine: 0.64 mg/dL (ref 0.44–1.00)
GFR, Estimated: >60 mL/min (ref >60)
Glucose, Bld: 79 mg/dL (ref 70–99)
Potassium: 4.1 mmol/L (ref 3.5–5.1)
Sodium: 137 mmol/L (ref 135–145)
Total Bilirubin: 0.4 mg/dL (ref 0.3–1.2)
Total Protein: 5.8 g/dL — ABNORMAL LOW (ref 6.5–8.1)

## 2020-09-26 NOTE — Progress Notes (Signed)
[facility] [phone]   [phone]) [phone]  OFFICE PROGRESS NOTE  Biagio Borg, MD [place] [address]  DIAGNOSIS: Extensive stage (T3, N2, M1 C) small cell lung cancer presented with large left lower lobe lung mass in addition to right hilar and mediastinal lymphadenopathy as well as satellite nodule in the left lower lobe and peripheral pleural lesion as well as metastatic liver lesions diagnosed in May 2022.  PRIOR THERAPY: None  CURRENT THERAPY: Systemic chemotherapy with carboplatin for AUC of 5 from day 1, etoposide 100 Mg/M2 on days 1, 2 and 3 with Cosela before the chemotherapy as well as Imfinzi 1500 Mg IV on day 1 every 3 weeks.  Status post 1 cycle.  First cycle started September 18, 2020  INTERVAL HISTORY: Dawn Gomez 85 y.o. female returns to the clinic today for follow-up visit accompanied by her son.  The patient is feeling fine today with no concerning complaints.  She denied having any current chest pain but continues to have the baseline shortness of breath and she is currently on home oxygen.  She also has mild cough with blood-tinged sputum but no frank hemoptysis.  The patient denied having any nausea, vomiting, diarrhea or constipation.  She denied having any headache or visual changes.  She started her treatment with systemic chemotherapy last week and tolerated the first week of her treatment well.  She is here today for evaluation and repeat blood work.   MEDICAL HISTORY: Past Medical History:  Diagnosis Date   Abnormal heart rhythm    Aortic aneurysm (HCC)    Aortic atherosclerosis ([place]) 03/03/2019   Dyspnea    Hypothyroid    OSA (obstructive sleep apnea)    on cpap   Psoriasis 02/05/2017   Pulmonary fibrosis (HCC)    SVT (supraventricular tachycardia) (HCC)    Thyroid disease    hypo    ALLERGIES:  is allergic to lipitor [atorvastatin], codeine, hydromorphone hcl, levaquin [levofloxacin in d5w],  morphine, oxycodone-acetaminophen, propoxyphene n-acetaminophen, and simvastatin.  MEDICATIONS:  Current Outpatient Medications  Medication Sig Dispense Refill   acetaminophen (TYLENOL) 500 MG tablet Take 500 mg by mouth every 6 (six) hours as needed.     Ascorbic Acid (VITAMIN C PO) Take by mouth.     Azelastine-Fluticasone 137-50 MCG/ACT SUSP Place 1 spray into the nose every 12 (twelve) hours. 23 g 5   Blood Glucose Monitoring Suppl (TRUE METRIX METER) w/Device KIT USE AS DIRECTED 1 kit 0   CALCIUM-MAGNESIUM-ZINC PO Take 1 tablet by mouth daily.     cholecalciferol (VITAMIN D) 1000 units tablet Take 5,000 Units by mouth daily.     diltiazem (CARDIZEM CD) 180 MG 24 hr capsule Take 1 capsule (180 mg total) by mouth daily. 90 capsule 2   escitalopram (LEXAPRO) 20 MG tablet Take 1 tablet (20 mg total) by mouth at bedtime. 90 tablet 3   fenofibrate 160 MG tablet TAKE 1 TABLET EVERY DAY 90 tablet 1   fluocinonide (LIDEX) 0.05 % external solution APP EXT AA OF SCALP NIGHTLY  3   furosemide (LASIX) 80 MG tablet Take 1 tablet (80 mg total) by mouth daily. 90 tablet 3   guaiFENesin (MUCINEX) 600 MG 12 hr tablet Take 2 tablets (1,200 mg total) by mouth 2 (two) times daily as needed. 60 tablet 2   HYDROcodone-acetaminophen (NORCO/VICODIN) 5-325 MG tablet Take 1 tablet by mouth every 6 (six) hours as needed. 30 tablet 0  ipratropium-albuterol (DUONEB) 0.5-2.5 (3) MG/3ML SOLN Take 3 mLs by nebulization every 4 (four) hours as needed. 360 mL 0   levothyroxine (SYNTHROID) 100 MCG tablet Take 1 tablet (100 mcg total) by mouth daily before breakfast. 90 tablet 1   lidocaine-prilocaine (EMLA) cream 30 Squeeze  1/2 tsp on a cotton ball and apply to skin over port a cath site. Do not rub it in. Cover with plastic wrap. Apply 1-2 hours prior to your treatment. 30 g 0   lisinopril (ZESTRIL) 5 MG tablet Take 1 tablet (5 mg total) by mouth daily. 90 tablet 1   potassium chloride SA (KLOR-CON) 20 MEQ tablet TAKE 1  TABLET EVERY DAY WITH FOOD 90 tablet 3   prochlorperazine (COMPAZINE) 10 MG tablet Take 1 tablet (10 mg total) by mouth every 6 (six) hours as needed for nausea or vomiting. 30 tablet 0   rosuvastatin (CRESTOR) 20 MG tablet Take 1 tablet (20 mg total) by mouth daily. 90 tablet 3   tiZANidine (ZANAFLEX) 2 MG tablet TAKE 1 TABLET THREE TIMES DAILY 270 tablet 1   traMADol (ULTRAM) 50 MG tablet Take 1 tablet (50 mg total) by mouth 4 (four) times daily. 360 tablet 1   traZODone (DESYREL) 50 MG tablet Take 1 tablet (50 mg total) by mouth at bedtime as needed for sleep. 90 tablet 1   vitamin B-12 (CYANOCOBALAMIN) 1000 MCG tablet Take 500 mcg by mouth daily.     vitamin C (ASCORBIC ACID) 500 MG tablet Take 500 mg by mouth daily.     Vitamin D, Ergocalciferol, (DRISDOL) 50000 units CAPS capsule Take 1 capsule (50,000 Units total) by mouth every 7 (seven) days. 12 capsule 0   vitamin E 400 UNIT capsule Take 200 Units by mouth daily.     No current facility-administered medications for this visit.    SURGICAL HISTORY:  Past Surgical History:  Procedure Laterality Date   ABDOMINAL HYSTERECTOMY     APPENDECTOMY     CHOLECYSTECTOMY     CRANIOTOMY     for aneurysms   CRANIOTOMY  1980 x 2   aneurysmal clipping   HERNIA REPAIR     IR IMAGING GUIDED PORT INSERTION  09/15/2020   TOTAL ABDOMINAL HYSTERECTOMY     TUBAL LIGATION      REVIEW OF SYSTEMS:  Constitutional: positive for fatigue Eyes: negative Ears, nose, mouth, throat, and face: negative Respiratory: positive for cough, dyspnea on exertion, and sputum Cardiovascular: negative Gastrointestinal: negative Genitourinary:negative Integument/breast: negative Hematologic/lymphatic: negative Musculoskeletal:positive for muscle weakness Neurological: negative Behavioral/Psych: negative Endocrine: negative Allergic/Immunologic: negative   PHYSICAL EXAMINATION: General appearance: alert, cooperative, appears older than stated age, and no  distress Head: Normocephalic, without obvious abnormality, atraumatic Neck: no adenopathy, no JVD, supple, symmetrical, trachea midline, and thyroid not enlarged, symmetric, no tenderness/mass/nodules Lymph nodes: Cervical, supraclavicular, and axillary nodes normal. Resp: clear to auscultation bilaterally Back: symmetric, no curvature. ROM normal. No CVA tenderness. Cardio: regular rate and rhythm, S1, S2 normal, no murmur, click, rub or gallop GI: soft, non-tender; bowel sounds normal; no masses,  no organomegaly Extremities: extremities normal, atraumatic, no cyanosis or edema Neurologic: Alert and oriented X 3, normal strength and tone. Normal symmetric reflexes. Normal coordination and gait  ECOG PERFORMANCE STATUS: 1 - Symptomatic but completely ambulatory  Blood pressure (!) 96/49, pulse 63, temperature (!) 97 F (36.1 C), temperature source Tympanic, resp. rate 18, weight 142 lb 14.4 oz (64.8 kg), SpO2 97 %.  LABORATORY DATA: Lab Results  Component Value Date  WBC 2.8 (L) 09/26/2020   HGB 9.7 (L) 09/26/2020   HCT 29.3 (L) 09/26/2020   MCV 90.7 09/26/2020   PLT 139 (L) 09/26/2020      Chemistry      Component Value Date/Time   NA 137 09/26/2020 1441   K 4.1 09/26/2020 1441   CL 102 09/26/2020 1441   CO2 27 09/26/2020 1441   BUN 16 09/26/2020 1441   CREATININE 0.64 09/26/2020 1441   CREATININE 1.50 (H) 12/17/2019 1519      Component Value Date/Time   CALCIUM 8.8 (L) 09/26/2020 1441   ALKPHOS 43 09/26/2020 1441   AST 27 09/26/2020 1441   ALT 18 09/26/2020 1441   BILITOT 0.4 09/26/2020 1441       RADIOGRAPHIC STUDIES: CT Head W Wo Contrast  Result Date: 09/14/2020 CLINICAL DATA:  Non-small cell lung cancer staging. Per patient, has a history of brain aneurysm with clips EXAM: CT HEAD WITHOUT AND WITH CONTRAST TECHNIQUE: Contiguous axial images were obtained from the base of the skull through the vertex without and with intravenous contrast CONTRAST:  19m  OMNIPAQUE IOHEXOL 300 MG/ML  SOLN COMPARISON:  Aug 11, 2007. FINDINGS: Initial attempt at postcontrast imaging failed due to contrast extravasation. Repeat imaging was performed successfully with contrast. Brain: Left temporal lobe encephalomalacia with aneurysm clip in this region and in the region of the anterior communicating artery. Surrounding streak artifact limits evaluation. No clear metastatic disease. Focus of enhancement in the right parietal cortex on axial imaging appears curvilinear on reformatted imaging and therefore is favored to represent a vessel. No evidence of acute large vascular territory infarct. No hydrocephalus, acute hemorrhage, mass lesion, or abnormal mass effect. Mild to moderate scattered white matter hypoattenuation is nonspecific but most likely related to chronic microvascular ischemic disease. Vascular: No hyperdense vessel or unexpected calcification. Left MCA and ACA vessels are obscured in the region of the aneurysm clips. Skull: Left frontotemporal craniectomy with cranioplasty. No evidence of acute fracture or suspicious bone lesion. Sinuses/Orbits: Mild ethmoid air cell mucosal thickening. Frothy secretions in the left sphenoid sinus with bilateral sphenoid sinus air-fluid levels. Unremarkable orbits. Other: Small right mastoid effusion. IMPRESSION: 1. No evidence of acute intracranial abnormality or metastatic disease within the sensitivity of CT technique and the limitation of streak artifact from aneurysm clips. 2. Similar left frontotemporal craniectomy with cranioplasty, anterior left temporal encephalomalacia, and aneurysm clips. 3. Sphenoethmoidal mucosal thickening with sphenoid air-fluid levels and frothy secretions. Electronically Signed   By: FMargaretha SheffieldMD   On: 09/14/2020 07:12   NM PET Image Initial (PI) Skull Base To Thigh  Result Date: 09/05/2020 CLINICAL DATA:  Initial treatment strategy for suspected pulmonary neoplasm in a 85year old female found  to have lung mass on recent chest CT. EXAM: NUCLEAR MEDICINE PET SKULL BASE TO THIGH TECHNIQUE: 7.18 mCi F-18 FDG was injected intravenously. Full-ring PET imaging was performed from the skull base to thigh after the radiotracer. CT data was obtained and used for attenuation correction and anatomic localization. Fasting blood glucose: 88 mg/dl COMPARISON:  CT of the chest from Aug 14, 2020. FINDINGS: Mediastinal blood pool activity: SUV max 2.81 Liver activity: SUV max NA NECK: No hypermetabolic lymph nodes in the neck. Incidental CT findings: none CHEST: Areas of adenopathy in the mediastinum and LEFT hilum contiguous with large LEFT pulmonary mass (image 70/4) maximum axial dimension approximately 7.2 x 5.9 cm, contiguous with pleural thickening that extends anteriorly and with bulky mediastinal adenopathy in the subcarinal region, AP window  and along LEFT paratracheal chain. Maximum SUV of the dominant mass measuring 17 with hypermetabolic activity exhibited in mediastinal and LEFT hilar lymph nodes. (Image 63/4) 1.7 cm subcarinal nodal tissue with a maximum SUV of 10.4. LEFT paratracheal nodal tissue on image 56/4, dominant node at 8 mm with a maximum SUV of 12.8. Enlarged lymph node in the AP window (image 54/411 mm only with mild increased metabolic activity SUV difficult to measure but only slightly greater than adjacent mediastinal blood pool. No additional areas of frankly hypermetabolic activity in the chest. Incidental CT findings: Aortic atherosclerosis. No sign of aneurysmal dilation of the thoracic aorta. Heart size enlarged as on the prior study along with engorgement/enlargement of central pulmonary vessels. Limited assessment of cardiovascular structures given lack of intravenous contrast. Signs of pulmonary emphysema and interstitial disease as on the recent chest CT. ABDOMEN/PELVIS: Evidence of hepatic metastatic disease. (Image 89/4) 3.2 cm lesion in the central RIGHT/medial LEFT hepatic lobe with  a maximum SUV of 10.1, slightly less metabolism but still with elevated metabolic activity in a small lesion near the IVC confluence (image 86/4 measuring approximately 1.6 cm. At least 6 additional lesions in the liver largest along the RIGHT hepatic margin measuring 3.2 cm on the noncontrast CT with a maximum SUV of 8.8 (image 101/4) Small lesion in the lateral segment of the LEFT hepatic lobe on image 105 of series 4 serves as an additional example of hepatic metastatic disease and measures 1.5 cm. Suspect smaller lesions in the dome of the RIGHT hemi liver on image 82 of series 4 as well. Portacaval lymph node measuring 12 mm short axis with a maximum SUV of 5.3 (image 102/4). No additional signs of adenopathy in the abdomen. Incidental CT findings: Hepatic metastatic disease as above. Post cholecystectomy. No acute findings relative to pancreas, spleen, adrenal glands and kidneys. Small hiatal hernia. Colonic diverticulosis. Calcified atheromatous plaque of the abdominal aorta with normal caliber abdominal aorta. Post abdominal wall reconstruction with mesh. Post hysterectomy. SKELETON: Areas of increased metabolic activity along the posterior LEFT and RIGHT iliac crest as well as within the sacrum compatible with metastatic foci. (Image 132/4) maximum SUV of 4.4 without discrete CT correlate other than very vague sclerosis in the S1 vertebral body measuring approximately 2 cm. Similar activity seen in the LEFT posterior iliac measuring approximately 1.3 cm. A second focus in the LEFT iliac crest posteriorly on image 127 of series 3 also approximately 1 cm. These areas without definitive CT correlate as is a small focus of increased metabolic activity in the posterior RIGHT iliac on image 126 of series 4, this measures approximately 1.4 cm and shows maximum SUV of 4.1 Sclerosis of the RIGHT eighth rib along posterolateral aspect with a maximum SUV of 3.0 without signs of fracture. Heterogeneous areas of uptake  in the lumbar spine with suspicious area in the posterior vertebral body on the RIGHT at L4 and in the anterior vertebral body along the RIGHT margin of L3. Maximum SUV in the posterior aspect of L4 approximately 3.3 Incidental CT findings: Spinal degenerative changes. Post LEFT frontotemporal craniotomy. IMPRESSION: Findings of small cell lung cancer with associated visceral and nodal metastases as described above. Multifocal liver metastatic disease in addition to abdominal nodal involvement. Multifocal bony metastatic disease as described. Electronically Signed   By: Zetta Bills M.D.   On: 09/05/2020 16:59   [place] CORE BIOPSY (LIVER)  Result Date: 08/29/2020 INDICATION: 85 year old with left lung mass and liver lesions. Tissue diagnosis is needed.  EXAM: ULTRASOUND-GUIDED LIVER LESION BIOPSY MEDICATIONS: Moderate sedation ANESTHESIA/SEDATION: Moderate (conscious) sedation was employed during this procedure. A total of Versed 2.0 mg and Fentanyl 100 mcg was administered intravenously. Moderate Sedation Time: 13 minutes. The patient's level of consciousness and vital signs were monitored continuously by radiology nursing throughout the procedure under my direct supervision. FLUOROSCOPY TIME:  Fluoroscopy Time: None COMPLICATIONS: None immediate. PROCEDURE: Informed written consent was obtained from the patient after a thorough discussion of the procedural risks, benefits and alternatives. All questions were addressed. A timeout was performed prior to the initiation of the procedure. Liver was evaluated with ultrasound. Large lesion in the right hepatic lobe was targeted for biopsy. Right abdominal subcostal region was prepped with chlorhexidine and sterile field was created. Maximal barrier sterile technique was utilized including caps, mask, sterile gowns, sterile gloves, sterile drape, hand hygiene and skin antiseptic. Skin was anesthetized with 1% lidocaine. A small incision was made. Using ultrasound  guidance, a 17 gauge coaxial needle was directed into the hepatic lesion and multiple core biopsies were obtained an 18 gauge device. Gel-Foam slurry was injected through the 17 gauge needle as it was removed. Bandage placed over the puncture site. FINDINGS: Several hepatic lesions are compatible with metastatic disease. Dominant lesion in the right hepatic lobe measures up to 3.3 cm and this lesion was targeted for biopsy. Needle position confirmed within the lesion. Adequate core specimens obtained and placed in formalin. No immediate bleeding or hematoma formation. Gel-Foam slurry identified within the lesion at the end of the procedure. IMPRESSION: Ultrasound-guided core biopsy of a right hepatic lesion. Electronically Signed   By: Markus Daft M.D.   On: 08/29/2020 15:41   IR IMAGING GUIDED PORT INSERTION  Result Date: 09/15/2020 CLINICAL DATA:  Left lung carcinoma and need for porta cath for chemotherapy. EXAM: IMPLANTED PORT A CATH PLACEMENT WITH ULTRASOUND AND FLUOROSCOPIC GUIDANCE ANESTHESIA/SEDATION: 2.0 mg IV Versed; 100 mcg IV Fentanyl Total Moderate Sedation Time:  35 minutes The patient's level of consciousness and physiologic status were continuously monitored during the procedure by Radiology nursing. FLUOROSCOPY TIME:  54 seconds.  9.0 mGy. PROCEDURE: The procedure, risks, benefits, and alternatives were explained to the patient. Questions regarding the procedure were encouraged and answered. The patient understands and consents to the procedure. A time-out was performed prior to initiating the procedure. Ultrasound was utilized to confirm patency of the right internal jugular vein. The right neck and chest were prepped with chlorhexidine in a sterile fashion, and a sterile drape was applied covering the operative field. Maximum barrier sterile technique with sterile gowns and gloves were used for the procedure. Local anesthesia was provided with 1% lidocaine. After creating a small venotomy  incision, a 21 gauge needle was advanced into the right internal jugular vein under direct, real-time ultrasound guidance. Ultrasound image documentation was performed. After securing guidewire access, an 8 Fr dilator was placed. A J-wire was kinked to measure appropriate catheter length. A subcutaneous port pocket was then created along the upper chest wall utilizing sharp and blunt dissection. Portable cautery was utilized. The pocket was irrigated with sterile saline. A single lumen power injectable port was chosen for placement. The 8 Fr catheter was tunneled from the port pocket site to the venotomy incision. The port was placed in the pocket. External catheter was trimmed to appropriate length based on guidewire measurement. At the venotomy, an 8 Fr peel-away sheath was placed over a guidewire. The catheter was then placed through the sheath and the sheath removed.  Final catheter positioning was confirmed and documented with a fluoroscopic spot image. The port was accessed with a needle and aspirated and flushed with heparinized saline. The access needle was removed. The venotomy and port pocket incisions were closed with subcutaneous 3-0 Monocryl and subcuticular 4-0 Vicryl. Dermabond was applied to both incisions. COMPLICATIONS: COMPLICATIONS None FINDINGS: After catheter placement, the tip lies at the cavo-atrial junction. The catheter aspirates normally and is ready for immediate use. IMPRESSION: Placement of single lumen port a cath via right internal jugular vein. The catheter tip lies at the cavo-atrial junction. A power injectable port a cath was placed and is ready for immediate use. Electronically Signed   By: Aletta Edouard M.D.   On: 09/15/2020 16:20    ASSESSMENT AND PLAN: This is a very pleasant 85 years old white female recently diagnosed with extensive stage (T3, in 2, M1 C) small cell lung cancer presented with large left lower lobe lung mass in addition to right hilar and mediastinal  lymphadenopathy as well as satellite nodule in the left lower lobe and peripheral pleural lesion in addition to metastatic liver disease diagnosed in May 2022. The patient is currently undergoing systemic chemotherapy with carboplatin for AUC of 5 on day 1, etoposide 100 Mg/M2 on days 1, 2 and 3 with Cosela before the chemotherapy and Imfinzi 1500 Mg IV every 3 weeks on day 1.  The patient tolerated the first week of her treatment fairly well with no concerning adverse effects. She had CT scan of the head performed recently that showed no concerning finding for metastatic disease to the brain. I recommended for the patient to proceed with her treatment as planned and she is expected to start cycle #2 in 2 weeks. For the blood-tinged sputum, the patient was advised to call immediately if she has any significant hemoptysis, I may consider referring her to radiation oncology for palliative radiotherapy. The patient was advised to call immediately if she has any other concerning symptoms in the interval. The patient voices understanding of current disease status and treatment options and is in agreement with the current care plan.  All questions were answered. The patient knows to call the clinic with any problems, questions or concerns. We can certainly see the patient much sooner if necessary.  The total time spent in the appointment was 30 minutes.  Disclaimer: This note was dictated with voice recognition software. Similar sounding words can inadvertently be transcribed and may not be corrected upon review.

## 2020-09-28 ENCOUNTER — Telehealth: Payer: Self-pay | Admitting: Internal Medicine

## 2020-09-28 NOTE — Telephone Encounter (Signed)
Scheduled per los. Called and spoke with patient. Confirmed appt 

## 2020-10-02 ENCOUNTER — Other Ambulatory Visit: Payer: Self-pay

## 2020-10-02 ENCOUNTER — Inpatient Hospital Stay: Payer: Medicare HMO

## 2020-10-02 ENCOUNTER — Other Ambulatory Visit (HOSPITAL_COMMUNITY)
Admission: RE | Admit: 2020-10-02 | Discharge: 2020-10-02 | Disposition: A | Discharge status: Home / Self Care | Payer: Medicare HMO | Source: Ambulatory Visit | Attending: Pulmonary Disease | Admitting: Pulmonary Disease

## 2020-10-02 DIAGNOSIS — C778 Secondary and unspecified malignant neoplasm of lymph nodes of multiple regions: Secondary | ICD-10-CM | POA: Diagnosis not present

## 2020-10-02 DIAGNOSIS — R042 Hemoptysis: Secondary | ICD-10-CM | POA: Diagnosis not present

## 2020-10-02 DIAGNOSIS — Z5111 Encounter for antineoplastic chemotherapy: Secondary | ICD-10-CM | POA: Diagnosis not present

## 2020-10-02 DIAGNOSIS — Z01812 Encounter for preprocedural laboratory examination: Secondary | ICD-10-CM | POA: Diagnosis not present

## 2020-10-02 DIAGNOSIS — Z20822 Contact with and (suspected) exposure to covid-19: Secondary | ICD-10-CM | POA: Insufficient documentation

## 2020-10-02 DIAGNOSIS — C787 Secondary malignant neoplasm of liver and intrahepatic bile duct: Secondary | ICD-10-CM | POA: Diagnosis not present

## 2020-10-02 DIAGNOSIS — Z79899 Other long term (current) drug therapy: Secondary | ICD-10-CM | POA: Diagnosis not present

## 2020-10-02 DIAGNOSIS — Z90710 Acquired absence of both cervix and uterus: Secondary | ICD-10-CM | POA: Diagnosis not present

## 2020-10-02 DIAGNOSIS — C3412 Malignant neoplasm of upper lobe, left bronchus or lung: Secondary | ICD-10-CM | POA: Diagnosis not present

## 2020-10-02 DIAGNOSIS — C3432 Malignant neoplasm of lower lobe, left bronchus or lung: Secondary | ICD-10-CM

## 2020-10-02 DIAGNOSIS — C7951 Secondary malignant neoplasm of bone: Secondary | ICD-10-CM | POA: Diagnosis not present

## 2020-10-02 LAB — CBC WITH DIFFERENTIAL (CANCER CENTER ONLY)
Abs Immature Granulocytes: 0 10*3/uL (ref 0.00–0.07)
Basophils Absolute: 0 10*3/uL (ref 0.0–0.1)
Basophils Relative: 2 %
Eosinophils Absolute: 0 10*3/uL (ref 0.0–0.5)
Eosinophils Relative: 2 %
HCT: 29.3 % — ABNORMAL LOW (ref 36.0–46.0)
Hemoglobin: 9.7 g/dL — ABNORMAL LOW (ref 12.0–15.0)
Immature Granulocytes: 0 %
Lymphocytes Relative: 40 %
Lymphs Abs: 0.8 10*3/uL (ref 0.7–4.0)
MCH: 29.9 pg (ref 26.0–34.0)
MCHC: 33.1 g/dL (ref 30.0–36.0)
MCV: 90.4 fL (ref 80.0–100.0)
Monocytes Absolute: 0.3 10*3/uL (ref 0.1–1.0)
Monocytes Relative: 16 %
Neutro Abs: 0.8 10*3/uL — ABNORMAL LOW (ref 1.7–7.7)
Neutrophils Relative %: 40 %
Platelet Count: 80 10*3/uL — ABNORMAL LOW (ref 150–400)
RBC: 3.24 MIL/uL — ABNORMAL LOW (ref 3.87–5.11)
RDW: 13.2 % (ref 11.5–15.5)
WBC Count: 1.9 10*3/uL — ABNORMAL LOW (ref 4.0–10.5)
nRBC: 0 % (ref 0.0–0.2)

## 2020-10-02 LAB — CMP (CANCER CENTER ONLY)
ALT: 14 U/L (ref 0–44)
AST: 22 U/L (ref 15–41)
Albumin: 3.3 g/dL — ABNORMAL LOW (ref 3.5–5.0)
Alkaline Phosphatase: 48 U/L (ref 38–126)
Anion gap: 9 (ref 5–15)
BUN: 12 mg/dL (ref 8–23)
CO2: 30 mmol/L (ref 22–32)
Calcium: 9.8 mg/dL (ref 8.9–10.3)
Chloride: 99 mmol/L (ref 98–111)
Creatinine: 0.76 mg/dL (ref 0.44–1.00)
GFR, Estimated: >60 mL/min (ref >60)
Glucose, Bld: 77 mg/dL (ref 70–99)
Potassium: 4.6 mmol/L (ref 3.5–5.1)
Sodium: 138 mmol/L (ref 135–145)
Total Bilirubin: 0.4 mg/dL (ref 0.3–1.2)
Total Protein: 6.1 g/dL — ABNORMAL LOW (ref 6.5–8.1)

## 2020-10-03 ENCOUNTER — Other Ambulatory Visit: Payer: Medicare HMO

## 2020-10-03 LAB — SARS CORONAVIRUS 2 (TAT 6-24 HRS): SARS Coronavirus 2: NEGATIVE

## 2020-10-05 ENCOUNTER — Other Ambulatory Visit: Payer: Self-pay | Admitting: Internal Medicine

## 2020-10-05 ENCOUNTER — Ambulatory Visit (INDEPENDENT_AMBULATORY_CARE_PROVIDER_SITE_OTHER): Payer: Medicare HMO | Admitting: Pulmonary Disease

## 2020-10-05 ENCOUNTER — Other Ambulatory Visit: Payer: Self-pay

## 2020-10-05 ENCOUNTER — Ambulatory Visit: Payer: Medicare HMO | Admitting: Pulmonary Disease

## 2020-10-05 ENCOUNTER — Encounter: Payer: Self-pay | Admitting: Pulmonary Disease

## 2020-10-05 VITALS — BP 110/60 | HR 77 | Ht 60.0 in | Wt 141.8 lb

## 2020-10-05 DIAGNOSIS — J849 Interstitial pulmonary disease, unspecified: Secondary | ICD-10-CM | POA: Diagnosis not present

## 2020-10-05 DIAGNOSIS — R911 Solitary pulmonary nodule: Secondary | ICD-10-CM | POA: Diagnosis not present

## 2020-10-05 DIAGNOSIS — J439 Emphysema, unspecified: Secondary | ICD-10-CM | POA: Diagnosis not present

## 2020-10-05 DIAGNOSIS — J8410 Pulmonary fibrosis, unspecified: Secondary | ICD-10-CM | POA: Diagnosis not present

## 2020-10-05 DIAGNOSIS — C3432 Malignant neoplasm of lower lobe, left bronchus or lung: Secondary | ICD-10-CM | POA: Diagnosis not present

## 2020-10-05 DIAGNOSIS — J9610 Chronic respiratory failure, unspecified whether with hypoxia or hypercapnia: Secondary | ICD-10-CM | POA: Diagnosis not present

## 2020-10-05 LAB — PULMONARY FUNCTION TEST
DL/VA % pred: 39 %
DL/VA: 1.64 ml/min/mmHg/L
DLCO cor % pred: 27 %
DLCO cor: 4.47 ml/min/mmHg
DLCO unc % pred: 23 %
DLCO unc: 3.86 ml/min/mmHg
FEF 25-75 Post: 1.32 L/sec
FEF 25-75 Pre: 1.28 L/sec
FEF2575-%Change-Post: 3 %
FEF2575-%Pred-Post: 121 %
FEF2575-%Pred-Pre: 117 %
FEV1-%Change-Post: 8 %
FEV1-%Pred-Post: 84 %
FEV1-%Pred-Pre: 78 %
FEV1-Post: 1.27 L
FEV1-Pre: 1.17 L
FEV1FVC-%Change-Post: 11 %
FEV1FVC-%Pred-Pre: 104 %
FEV6-%Change-Post: 4 %
FEV6-%Pred-Post: 77 %
FEV6-%Pred-Pre: 73 %
FEV6-Post: 1.49 L
FEV6-Pre: 1.42 L
FEV6FVC-%Change-Post: 0 %
FEV6FVC-%Pred-Post: 106 %
FEV6FVC-%Pred-Pre: 106 %
FVC-%Change-Post: -2 %
FVC-%Pred-Post: 72 %
FVC-%Pred-Pre: 74 %
FVC-Post: 1.49 L
FVC-Pre: 1.53 L
Post FEV1/FVC ratio: 86 %
Post FEV6/FVC ratio: 100 %
Pre FEV1/FVC ratio: 77 %
Pre FEV6/FVC Ratio: 100 %
RV % pred: 52 %
RV: 1.16 L
TLC % pred: 71 %
TLC: 3.2 L

## 2020-10-05 NOTE — Progress Notes (Signed)
Full PFT completed today ? ?

## 2020-10-05 NOTE — Patient Instructions (Signed)
I am glad you are stable and tolerating chemotherapy well Continue supplemental oxygen Continue inhalers  Follow-up in 4 months

## 2020-10-05 NOTE — Progress Notes (Signed)
Dawn Gomez    [id number]    12-18-1935  Primary Care Physician:John, Hunt Oris, MD  Referring Physician: Biagio Borg, MD [address] [place],   [address]  Chief complaint: Follow-up for COPD, pulmonary fibrosis, small cell lung cancer  HPI: 85 year old ex-smoker with chronic respiratory failure, emphysema, pulmonary fibrosis. She has emphysema which is managed with duo nebs and supplemental oxygen Also has history of unspecified pulmonary fibrosis which has been stable over the years.  She is currently not on any treatment for pulmonary fibrosis  Pets: Has a dog  Occupation: Homemaker Exposures: No known exposures.  No mold, hot tub, Jacuzzi, no down pillows or comforters Smoking history: 100-pack-year smoker.  Quit in 2008 Travel history: No significant travel history Relevant family history: No significant family history of lung disease  Interim history: Diagnosed with Extensive stage (T3, N2, M1 C) small cell lung cancer after she was found to have a large left lower lobe mass with lymphadenopathy, liver lesions.  Underwent IR biopsy of liver lesions Currently on systemic chemotherapy under the care of Dr. Julien Nordmann  Outpatient Encounter Medications as of 10/05/2020  Medication Sig   acetaminophen (TYLENOL) 500 MG tablet Take 500 mg by mouth every 6 (six) hours as needed.   Ascorbic Acid (VITAMIN C PO) Take by mouth.   Azelastine-Fluticasone 137-50 MCG/ACT SUSP Place 1 spray into the nose every 12 (twelve) hours.   Blood Glucose Monitoring Suppl (TRUE METRIX METER) w/Device KIT USE AS DIRECTED   CALCIUM-MAGNESIUM-ZINC PO Take 1 tablet by mouth daily.   cholecalciferol (VITAMIN D) 1000 units tablet Take 5,000 Units by mouth daily.   diltiazem (CARDIZEM CD) 180 MG 24 hr capsule Take 1 capsule (180 mg total) by mouth daily.   escitalopram (LEXAPRO) 20 MG tablet Take 1 tablet (20 mg total) by mouth at bedtime.   fenofibrate 160 MG tablet TAKE 1 TABLET  EVERY DAY   fluocinonide (LIDEX) 0.05 % external solution APP EXT AA OF SCALP NIGHTLY   furosemide (LASIX) 80 MG tablet Take 1 tablet (80 mg total) by mouth daily.   guaiFENesin (MUCINEX) 600 MG 12 hr tablet Take 2 tablets (1,200 mg total) by mouth 2 (two) times daily as needed.   HYDROcodone-acetaminophen (NORCO/VICODIN) 5-325 MG tablet Take 1 tablet by mouth every 6 (six) hours as needed.   ipratropium-albuterol (DUONEB) 0.5-2.5 (3) MG/3ML SOLN Take 3 mLs by nebulization every 4 (four) hours as needed.   levothyroxine (SYNTHROID) 100 MCG tablet Take 1 tablet (100 mcg total) by mouth daily before breakfast.   lidocaine-prilocaine (EMLA) cream 30 Squeeze  1/2 tsp on a cotton ball and apply to skin over port a cath site. Do not rub it in. Cover with plastic wrap. Apply 1-2 hours prior to your treatment.   lisinopril (ZESTRIL) 5 MG tablet Take 1 tablet (5 mg total) by mouth daily.   potassium chloride SA (KLOR-CON) 20 MEQ tablet TAKE 1 TABLET EVERY DAY WITH FOOD   prochlorperazine (COMPAZINE) 10 MG tablet Take 1 tablet (10 mg total) by mouth every 6 (six) hours as needed for nausea or vomiting.   rosuvastatin (CRESTOR) 20 MG tablet Take 1 tablet (20 mg total) by mouth daily.   tiZANidine (ZANAFLEX) 2 MG tablet TAKE 1 TABLET THREE TIMES DAILY   traMADol (ULTRAM) 50 MG tablet Take 1 tablet (50 mg total) by mouth 4 (four) times daily.   traZODone (DESYREL) 50 MG tablet Take 1 tablet (50 mg total)  by mouth at bedtime as needed for sleep.   vitamin B-12 (CYANOCOBALAMIN) 1000 MCG tablet Take 500 mcg by mouth daily.   vitamin C (ASCORBIC ACID) 500 MG tablet Take 500 mg by mouth daily.   Vitamin D, Ergocalciferol, (DRISDOL) 50000 units CAPS capsule Take 1 capsule (50,000 Units total) by mouth every 7 (seven) days.   vitamin E 400 UNIT capsule Take 200 Units by mouth daily.   No facility-administered encounter medications on file as of 10/05/2020.   Physical Exam: Blood pressure 130/70, pulse 60,  temperature (!) 97.4 F (36.3 C), height _0  (1.499 m), weight 171 lb 6.4 oz (77.7 kg), SpO2 100 %. Gen:      No acute distress HEENT:  EOMI, sclera anicteric Neck:     No masses; no thyromegaly Lungs:    Clear to auscultation bilaterally; normal respiratory effort CV:         Regular rate and rhythm; no murmurs Abd:      + bowel sounds; soft, non-tender; no palpable masses, no distension Ext:    No edema; adequate peripheral perfusion Skin:      Warm and dry; no rash Neuro: alert and oriented x 3 Psych: normal mood and affect  Data Reviewed: Imaging: CT high-resolution 12/24/2018-fibrotic interstitial lung disease with central and peripheral involvement.  No basal gradient with no progression.  Alternate diagnosis  CT high-resolution 08/14/2020-large left lower lobe lung mass with satellite nodules, liver mass  PET scan 09/05/2020-hypermetabolic lung mass with associated vesicular nodal metastasis, liver mets I have reviewed the images personally  PFTs: 02/09/2018 FVC 1.71 [78%], FEV1 1.50 [93%], F/F 88, TLC 3.29 [73%], DLCO 5.72 [30%] Mild restriction with severely reduced diffusion capacity Stable lung function compared to 2017  10/05/2020 FVC 1.49 [72%], FEV1 1.27 [84%], F/F 86, TLC 3.20 [71%], DLCO 3.86 [23%] Mild restriction, severe diffusion defect  Labs: 2009:  ANA, RF, ACE, ESR, all ok.  Autoimmune 01/2014:  Negative 04/2015 ANA, RF, CCP, JO-1, SSA, SSB, SCL-70, Anti-centromere all negative  Liver biopsy 08/29/2020-small cell cancer  Assessment:  Chronic respiratory failure, emphysema Continues on nebulizer therapy supplemental oxygen PFTs reviewed with no significant obstructive changes  Stage IV lung cancer Continue chemotherapy  Interstitial lung disease CT reviewed with nonspecific fibrotic changes in alternate pattern.  This has remained stable on PFTs and CT scan over many years Continue to observe this.  I do not believe will need aggressive work-up or  treatment at this point.  Plan/Recommendations: - Continue nebulizers - Chemotherapy per oncology  Follow-up in 6 months  Marshell Garfinkel MD [facility] 10/05/2020, 11:49 AM  CC: Biagio Borg, MD

## 2020-10-05 NOTE — Telephone Encounter (Signed)
Please refill as per office routine med refill policy (all routine meds refilled for 3 mo or monthly per pt preference up to one year from last visit, then month to month grace period for 3 mo, then further med refills will have to be denied)  

## 2020-10-10 ENCOUNTER — Inpatient Hospital Stay: Payer: Medicare HMO

## 2020-10-10 ENCOUNTER — Ambulatory Visit: Payer: Medicare HMO | Admitting: Physician Assistant

## 2020-10-10 ENCOUNTER — Inpatient Hospital Stay (HOSPITAL_BASED_OUTPATIENT_CLINIC_OR_DEPARTMENT_OTHER): Payer: Medicare HMO | Admitting: Internal Medicine

## 2020-10-10 ENCOUNTER — Other Ambulatory Visit: Payer: Self-pay

## 2020-10-10 ENCOUNTER — Inpatient Hospital Stay: Payer: Medicare HMO | Attending: Internal Medicine

## 2020-10-10 ENCOUNTER — Other Ambulatory Visit: Payer: Medicare HMO

## 2020-10-10 ENCOUNTER — Encounter: Payer: Self-pay | Admitting: Internal Medicine

## 2020-10-10 VITALS — BP 105/47 | HR 60 | Temp 97.0°F | Resp 19 | Ht 60.0 in | Wt 139.4 lb

## 2020-10-10 DIAGNOSIS — C3490 Malignant neoplasm of unspecified part of unspecified bronchus or lung: Secondary | ICD-10-CM | POA: Diagnosis not present

## 2020-10-10 DIAGNOSIS — C3432 Malignant neoplasm of lower lobe, left bronchus or lung: Secondary | ICD-10-CM

## 2020-10-10 DIAGNOSIS — Z5111 Encounter for antineoplastic chemotherapy: Secondary | ICD-10-CM | POA: Diagnosis not present

## 2020-10-10 DIAGNOSIS — Z79899 Other long term (current) drug therapy: Secondary | ICD-10-CM | POA: Diagnosis not present

## 2020-10-10 DIAGNOSIS — C787 Secondary malignant neoplasm of liver and intrahepatic bile duct: Secondary | ICD-10-CM | POA: Insufficient documentation

## 2020-10-10 DIAGNOSIS — Z7951 Long term (current) use of inhaled steroids: Secondary | ICD-10-CM | POA: Diagnosis not present

## 2020-10-10 LAB — CBC WITH DIFFERENTIAL (CANCER CENTER ONLY)
Abs Immature Granulocytes: 0.11 10*3/uL — ABNORMAL HIGH (ref 0.00–0.07)
Basophils Absolute: 0 10*3/uL (ref 0.0–0.1)
Basophils Relative: 1 %
Eosinophils Absolute: 0.1 10*3/uL (ref 0.0–0.5)
Eosinophils Relative: 2 %
HCT: 28.3 % — ABNORMAL LOW (ref 36.0–46.0)
Hemoglobin: 9.2 g/dL — ABNORMAL LOW (ref 12.0–15.0)
Immature Granulocytes: 3 %
Lymphocytes Relative: 30 %
Lymphs Abs: 1.2 10*3/uL (ref 0.7–4.0)
MCH: 29.7 pg (ref 26.0–34.0)
MCHC: 32.5 g/dL (ref 30.0–36.0)
MCV: 91.3 fL (ref 80.0–100.0)
Monocytes Absolute: 0.8 10*3/uL (ref 0.1–1.0)
Monocytes Relative: 20 %
Neutro Abs: 1.9 10*3/uL (ref 1.7–7.7)
Neutrophils Relative %: 44 %
Platelet Count: 280 10*3/uL (ref 150–400)
RBC: 3.1 MIL/uL — ABNORMAL LOW (ref 3.87–5.11)
RDW: 14.7 % (ref 11.5–15.5)
WBC Count: 4.1 10*3/uL (ref 4.0–10.5)
nRBC: 0 % (ref 0.0–0.2)

## 2020-10-10 LAB — CMP (CANCER CENTER ONLY)
ALT: 12 U/L (ref 0–44)
AST: 18 U/L (ref 15–41)
Albumin: 3.3 g/dL — ABNORMAL LOW (ref 3.5–5.0)
Alkaline Phosphatase: 51 U/L (ref 38–126)
Anion gap: 6 (ref 5–15)
BUN: 13 mg/dL (ref 8–23)
CO2: 30 mmol/L (ref 22–32)
Calcium: 9 mg/dL (ref 8.9–10.3)
Chloride: 101 mmol/L (ref 98–111)
Creatinine: 0.8 mg/dL (ref 0.44–1.00)
GFR, Estimated: >60 mL/min (ref >60)
Glucose, Bld: 80 mg/dL (ref 70–99)
Potassium: 4.1 mmol/L (ref 3.5–5.1)
Sodium: 137 mmol/L (ref 135–145)
Total Bilirubin: 0.3 mg/dL (ref 0.3–1.2)
Total Protein: 6.2 g/dL — ABNORMAL LOW (ref 6.5–8.1)

## 2020-10-10 MED ORDER — SODIUM CHLORIDE 0.9% FLUSH
10.0000 mL | INTRAVENOUS | Status: DC | PRN
  Administered 2020-10-10: 10 mL
  Filled 2020-10-10: qty 10

## 2020-10-10 MED ORDER — SODIUM CHLORIDE 0.9 % IV SOLN
100.0000 mg/m2 | Freq: Once | INTRAVENOUS | Status: AC
  Administered 2020-10-10: 170 mg via INTRAVENOUS
  Filled 2020-10-10: qty 8.5

## 2020-10-10 MED ORDER — TRILACICLIB DIHYDROCHLORIDE INJECTION 300 MG
240.0000 mg/m2 | Freq: Once | INTRAVENOUS | Status: AC
  Administered 2020-10-10: 405 mg via INTRAVENOUS
  Filled 2020-10-10: qty 27

## 2020-10-10 MED ORDER — SODIUM CHLORIDE 0.9 % IV SOLN
Freq: Once | INTRAVENOUS | Status: AC
  Filled 2020-10-10: qty 250

## 2020-10-10 MED ORDER — SODIUM CHLORIDE 0.9 % IV SOLN
150.0000 mg | Freq: Once | INTRAVENOUS | Status: AC
  Administered 2020-10-10: 150 mg via INTRAVENOUS
  Filled 2020-10-10: qty 150

## 2020-10-10 MED ORDER — PALONOSETRON HCL INJECTION 0.25 MG/5ML
INTRAVENOUS | Status: AC
  Filled 2020-10-10: qty 5

## 2020-10-10 MED ORDER — SODIUM CHLORIDE 0.9 % IV SOLN
10.0000 mg | Freq: Once | INTRAVENOUS | Status: AC
  Administered 2020-10-10: 10 mg via INTRAVENOUS
  Filled 2020-10-10: qty 10

## 2020-10-10 MED ORDER — SODIUM CHLORIDE 0.9 % IV SOLN
340.0000 mg | Freq: Once | INTRAVENOUS | Status: AC
  Administered 2020-10-10: 340 mg via INTRAVENOUS
  Filled 2020-10-10: qty 34

## 2020-10-10 MED ORDER — HEPARIN SOD (PORK) LOCK FLUSH 100 UNIT/ML IV SOLN
500.0000 [IU] | Freq: Once | INTRAVENOUS | Status: AC | PRN
  Administered 2020-10-10: 500 [IU]
  Filled 2020-10-10: qty 5

## 2020-10-10 MED ORDER — PALONOSETRON HCL INJECTION 0.25 MG/5ML
0.2500 mg | Freq: Once | INTRAVENOUS | Status: AC
  Administered 2020-10-10: 0.25 mg via INTRAVENOUS

## 2020-10-10 NOTE — Patient Instructions (Signed)
[facility]  Discharge Instructions: Thank you for choosing [facility] to provide your oncology and hematology care.   If you have a lab appointment with the [facility], please go directly to the [facility] and check in at the registration area.   Wear comfortable clothing and clothing appropriate for easy access to any Portacath or PICC line.   We strive to give you quality time with your provider. You may need to reschedule your appointment if you arrive late (15 or more minutes).  Arriving late affects you and other patients whose appointments are after yours.  Also, if you miss three or more appointments without notifying the office, you may be dismissed from the clinic at the provider's discretion.      For prescription refill requests, have your pharmacy contact our office and allow 72 hours for refills to be completed.    Today you received the following chemotherapy and/or immunotherapy agents Etoposide, Carboplatin       To help prevent nausea and vomiting after your treatment, we encourage you to take your nausea medication as directed.  BELOW ARE SYMPTOMS THAT SHOULD BE REPORTED IMMEDIATELY: *FEVER GREATER THAN 100.4 F (38 C) OR HIGHER *CHILLS OR SWEATING *NAUSEA AND VOMITING THAT IS NOT CONTROLLED WITH YOUR NAUSEA MEDICATION *UNUSUAL SHORTNESS OF BREATH *UNUSUAL BRUISING OR BLEEDING *URINARY PROBLEMS (pain or burning when urinating, or frequent urination) *BOWEL PROBLEMS (unusual diarrhea, constipation, pain near the anus) TENDERNESS IN MOUTH AND THROAT WITH OR WITHOUT PRESENCE OF ULCERS (sore throat, sores in mouth, or a toothache) UNUSUAL RASH, SWELLING OR PAIN  UNUSUAL VAGINAL DISCHARGE OR ITCHING   Items with * indicate a potential emergency and should be followed up as soon as possible or go to the Emergency Department if any problems should occur.  Please show the CHEMOTHERAPY ALERT CARD or IMMUNOTHERAPY ALERT CARD at  check-in to the Emergency Department and triage nurse.  Should you have questions after your visit or need to cancel or reschedule your appointment, please contact [facility]  Dept: [phone]  and follow the prompts.  Office hours are 8:00 a.m. to 4:30 p.m. Monday - Friday. Please note that voicemails left after 4:00 p.m. may not be returned until the following business day.  We are closed weekends and major holidays. You have access to a nurse at all times for urgent questions. Please call the main number to the clinic Dept: [phone] and follow the prompts.   For any non-urgent questions, you may also contact your provider using MyChart. We now offer e-Visits for anyone 21 and older to request care online for non-urgent symptoms. For details visit [email or web address].   Also download the MyChart app! Go to the app store, search "MyChart", open the app, select [facility], and log in with your MyChart username and password.  Due to Covid, a mask is required upon entering the hospital/clinic. If you do not have a mask, one will be given to you upon arrival. For doctor visits, patients may have 1 support person aged 63 or older with them. For treatment visits, patients cannot have anyone with them due to current Covid guidelines and our immunocompromised population.

## 2020-10-10 NOTE — Progress Notes (Signed)
[facility] [phone]   [phone]  OFFICE PROGRESS NOTE  Biagio Borg, MD [place] [address]  DIAGNOSIS: Extensive stage (T3, N2, M1 C) small cell lung cancer presented with large left lower lobe lung mass in addition to right hilar and mediastinal lymphadenopathy as well as satellite nodule in the left lower lobe and peripheral pleural lesion as well as metastatic liver lesions diagnosed in May 2022.  PRIOR THERAPY: None  CURRENT THERAPY: Systemic chemotherapy with carboplatin for AUC of 5 from day 1, etoposide 100 Mg/M2 on days 1, 2 and 3 with Cosela before the chemotherapy as well as Imfinzi 1500 Mg IV on day 1 every 3 weeks.  Status post 1 cycle.  First cycle started September 18, 2020  INTERVAL HISTORY: Dawn Gomez 85 y.o. female returns to the clinic today for follow-up visit.  The patient is feeling fine today with no concerning complaints except for mild fatigue and cough with dark sputum.  She denied having any current chest pain but has the baseline shortness of breath and she is currently on home oxygen.  She denied having any fever or chills.  She has no nausea, vomiting, diarrhea or constipation.  She has no headache or visual changes.  She tolerated the first cycle of her treatment fairly well.  She is here today for evaluation before starting cycle #2.   MEDICAL HISTORY: Past Medical History:  Diagnosis Date   Abnormal heart rhythm    Aortic aneurysm (HCC)    Aortic atherosclerosis ([facility]) 03/03/2019   Dyspnea    Hypothyroid    OSA (obstructive sleep apnea)    on cpap   Psoriasis 02/05/2017   Pulmonary fibrosis (HCC)    SVT (supraventricular tachycardia) (HCC)    Thyroid disease    hypo    ALLERGIES:  is allergic to lipitor [atorvastatin], codeine, hydromorphone hcl, levaquin [levofloxacin in d5w], morphine, oxycodone-acetaminophen, propoxyphene n-acetaminophen, and simvastatin.  MEDICATIONS:   Current Outpatient Medications  Medication Sig Dispense Refill   acetaminophen (TYLENOL) 500 MG tablet Take 500 mg by mouth every 6 (six) hours as needed.     Ascorbic Acid (VITAMIN C PO) Take by mouth.     Azelastine-Fluticasone 137-50 MCG/ACT SUSP Place 1 spray into the nose every 12 (twelve) hours. 23 g 5   Blood Glucose Monitoring Suppl (TRUE METRIX METER) w/Device KIT USE AS DIRECTED 1 kit 0   CALCIUM-MAGNESIUM-ZINC PO Take 1 tablet by mouth daily.     cholecalciferol (VITAMIN D) 1000 units tablet Take 5,000 Units by mouth daily.     diltiazem (CARDIZEM CD) 180 MG 24 hr capsule Take 1 capsule (180 mg total) by mouth daily. 90 capsule 2   escitalopram (LEXAPRO) 20 MG tablet Take 1 tablet (20 mg total) by mouth at bedtime. 90 tablet 3   fenofibrate 160 MG tablet TAKE 1 TABLET EVERY DAY 90 tablet 1   fluocinonide (LIDEX) 0.05 % external solution APP EXT AA OF SCALP NIGHTLY  3   furosemide (LASIX) 80 MG tablet Take 1 tablet (80 mg total) by mouth daily. 90 tablet 3   guaiFENesin (MUCINEX) 600 MG 12 hr tablet Take 2 tablets (1,200 mg total) by mouth 2 (two) times daily as needed. 60 tablet 2   HYDROcodone-acetaminophen (NORCO/VICODIN) 5-325 MG tablet Take 1 tablet by mouth every 6 (six) hours as needed. 30 tablet 0   ipratropium-albuterol (DUONEB) 0.5-2.5 (3) MG/3ML SOLN Take 3 mLs by nebulization every  4 (four) hours as needed. 360 mL 0   levothyroxine (SYNTHROID) 100 MCG tablet TAKE 1 TABLET EVERY DAY BEFORE BREAKFAST 90 tablet 2   lidocaine-prilocaine (EMLA) cream 30 Squeeze  1/2 tsp on a cotton ball and apply to skin over port a cath site. Do not rub it in. Cover with plastic wrap. Apply 1-2 hours prior to your treatment. 30 g 0   lisinopril (ZESTRIL) 5 MG tablet Take 1 tablet (5 mg total) by mouth daily. 90 tablet 1   potassium chloride SA (KLOR-CON) 20 MEQ tablet TAKE 1 TABLET EVERY DAY WITH FOOD 90 tablet 3   prochlorperazine (COMPAZINE) 10 MG tablet Take 1 tablet (10 mg total) by  mouth every 6 (six) hours as needed for nausea or vomiting. 30 tablet 0   rosuvastatin (CRESTOR) 20 MG tablet Take 1 tablet (20 mg total) by mouth daily. 90 tablet 3   tiZANidine (ZANAFLEX) 2 MG tablet TAKE 1 TABLET THREE TIMES DAILY 270 tablet 1   traMADol (ULTRAM) 50 MG tablet Take 1 tablet (50 mg total) by mouth 4 (four) times daily. 360 tablet 1   traZODone (DESYREL) 50 MG tablet Take 1 tablet (50 mg total) by mouth at bedtime as needed for sleep. 90 tablet 1   vitamin B-12 (CYANOCOBALAMIN) 1000 MCG tablet Take 500 mcg by mouth daily.     vitamin C (ASCORBIC ACID) 500 MG tablet Take 500 mg by mouth daily.     Vitamin D, Ergocalciferol, (DRISDOL) 50000 units CAPS capsule Take 1 capsule (50,000 Units total) by mouth every 7 (seven) days. 12 capsule 0   vitamin E 400 UNIT capsule Take 200 Units by mouth daily.     No current facility-administered medications for this visit.    SURGICAL HISTORY:  Past Surgical History:  Procedure Laterality Date   ABDOMINAL HYSTERECTOMY     APPENDECTOMY     CHOLECYSTECTOMY     CRANIOTOMY     for aneurysms   CRANIOTOMY  1980 x 2   aneurysmal clipping   HERNIA REPAIR     IR IMAGING GUIDED PORT INSERTION  09/15/2020   TOTAL ABDOMINAL HYSTERECTOMY     TUBAL LIGATION      REVIEW OF SYSTEMS:  A comprehensive review of systems was negative except for: Constitutional: positive for fatigue Respiratory: positive for cough, dyspnea on exertion, and sputum   PHYSICAL EXAMINATION: General appearance: alert, cooperative, appears older than stated age, and no distress Head: Normocephalic, without obvious abnormality, atraumatic Neck: no adenopathy, no JVD, supple, symmetrical, trachea midline, and thyroid not enlarged, symmetric, no tenderness/mass/nodules Lymph nodes: Cervical, supraclavicular, and axillary nodes normal. Resp: clear to auscultation bilaterally Back: symmetric, no curvature. ROM normal. No CVA tenderness. Cardio: regular rate and rhythm, S1, S2  normal, no murmur, click, rub or gallop GI: soft, non-tender; bowel sounds normal; no masses,  no organomegaly Extremities: extremities normal, atraumatic, no cyanosis or edema  ECOG PERFORMANCE STATUS: 1 - Symptomatic but completely ambulatory  Blood pressure (!) 105/47, pulse 60, temperature (!) 97 F (36.1 C), temperature source Tympanic, resp. rate 19, height 5' (1.524 m), weight 139 lb 6.4 oz (63.2 kg), SpO2 96 %.  LABORATORY DATA: Lab Results  Component Value Date   WBC 4.1 10/10/2020   HGB 9.2 (L) 10/10/2020   HCT 28.3 (L) 10/10/2020   MCV 91.3 10/10/2020   PLT 280 10/10/2020      Chemistry      Component Value Date/Time   NA 137 10/10/2020 0848   K 4.1  10/10/2020 0848   CL 101 10/10/2020 0848   CO2 30 10/10/2020 0848   BUN 13 10/10/2020 0848   CREATININE 0.80 10/10/2020 0848   CREATININE 1.50 (H) 12/17/2019 1519      Component Value Date/Time   CALCIUM 9.0 10/10/2020 0848   ALKPHOS 51 10/10/2020 0848   AST 18 10/10/2020 0848   ALT 12 10/10/2020 0848   BILITOT 0.3 10/10/2020 0848       RADIOGRAPHIC STUDIES: CT Head W Wo Contrast  Result Date: 09/14/2020 CLINICAL DATA:  Non-small cell lung cancer staging. Per patient, has a history of brain aneurysm with clips EXAM: CT HEAD WITHOUT AND WITH CONTRAST TECHNIQUE: Contiguous axial images were obtained from the base of the skull through the vertex without and with intravenous contrast CONTRAST:  4m OMNIPAQUE IOHEXOL 300 MG/ML  SOLN COMPARISON:  Aug 11, 2007. FINDINGS: Initial attempt at postcontrast imaging failed due to contrast extravasation. Repeat imaging was performed successfully with contrast. Brain: Left temporal lobe encephalomalacia with aneurysm clip in this region and in the region of the anterior communicating artery. Surrounding streak artifact limits evaluation. No clear metastatic disease. Focus of enhancement in the right parietal cortex on axial imaging appears curvilinear on reformatted imaging and  therefore is favored to represent a vessel. No evidence of acute large vascular territory infarct. No hydrocephalus, acute hemorrhage, mass lesion, or abnormal mass effect. Mild to moderate scattered white matter hypoattenuation is nonspecific but most likely related to chronic microvascular ischemic disease. Vascular: No hyperdense vessel or unexpected calcification. Left MCA and ACA vessels are obscured in the region of the aneurysm clips. Skull: Left frontotemporal craniectomy with cranioplasty. No evidence of acute fracture or suspicious bone lesion. Sinuses/Orbits: Mild ethmoid air cell mucosal thickening. Frothy secretions in the left sphenoid sinus with bilateral sphenoid sinus air-fluid levels. Unremarkable orbits. Other: Small right mastoid effusion. IMPRESSION: 1. No evidence of acute intracranial abnormality or metastatic disease within the sensitivity of CT technique and the limitation of streak artifact from aneurysm clips. 2. Similar left frontotemporal craniectomy with cranioplasty, anterior left temporal encephalomalacia, and aneurysm clips. 3. Sphenoethmoidal mucosal thickening with sphenoid air-fluid levels and frothy secretions. Electronically Signed   By: FMargaretha SheffieldMD   On: 09/14/2020 07:12   IR IMAGING GUIDED PORT INSERTION  Result Date: 09/15/2020 CLINICAL DATA:  Left lung carcinoma and need for porta cath for chemotherapy. EXAM: IMPLANTED PORT A CATH PLACEMENT WITH ULTRASOUND AND FLUOROSCOPIC GUIDANCE ANESTHESIA/SEDATION: 2.0 mg IV Versed; 100 mcg IV Fentanyl Total Moderate Sedation Time:  35 minutes The patient's level of consciousness and physiologic status were continuously monitored during the procedure by Radiology nursing. FLUOROSCOPY TIME:  54 seconds.  9.0 mGy. PROCEDURE: The procedure, risks, benefits, and alternatives were explained to the patient. Questions regarding the procedure were encouraged and answered. The patient understands and consents to the procedure. A  time-out was performed prior to initiating the procedure. Ultrasound was utilized to confirm patency of the right internal jugular vein. The right neck and chest were prepped with chlorhexidine in a sterile fashion, and a sterile drape was applied covering the operative field. Maximum barrier sterile technique with sterile gowns and gloves were used for the procedure. Local anesthesia was provided with 1% lidocaine. After creating a small venotomy incision, a 21 gauge needle was advanced into the right internal jugular vein under direct, real-time ultrasound guidance. Ultrasound image documentation was performed. After securing guidewire access, an 8 Fr dilator was placed. A J-wire was kinked to measure appropriate catheter length.  A subcutaneous port pocket was then created along the upper chest wall utilizing sharp and blunt dissection. Portable cautery was utilized. The pocket was irrigated with sterile saline. A single lumen power injectable port was chosen for placement. The 8 Fr catheter was tunneled from the port pocket site to the venotomy incision. The port was placed in the pocket. External catheter was trimmed to appropriate length based on guidewire measurement. At the venotomy, an 8 Fr peel-away sheath was placed over a guidewire. The catheter was then placed through the sheath and the sheath removed. Final catheter positioning was confirmed and documented with a fluoroscopic spot image. The port was accessed with a needle and aspirated and flushed with heparinized saline. The access needle was removed. The venotomy and port pocket incisions were closed with subcutaneous 3-0 Monocryl and subcuticular 4-0 Vicryl. Dermabond was applied to both incisions. COMPLICATIONS: COMPLICATIONS None FINDINGS: After catheter placement, the tip lies at the cavo-atrial junction. The catheter aspirates normally and is ready for immediate use. IMPRESSION: Placement of single lumen port a cath via right internal jugular  vein. The catheter tip lies at the cavo-atrial junction. A power injectable port a cath was placed and is ready for immediate use. Electronically Signed   By: Aletta Edouard M.D.   On: 09/15/2020 16:20    ASSESSMENT AND PLAN: This is a very pleasant 85 years old white female recently diagnosed with extensive stage (T3, in 2, M1 C) small cell lung cancer presented with large left lower lobe lung mass in addition to right hilar and mediastinal lymphadenopathy as well as satellite nodule in the left lower lobe and peripheral pleural lesion in addition to metastatic liver disease diagnosed in May 2022. The patient is currently undergoing systemic chemotherapy with carboplatin for AUC of 5 on day 1, etoposide 100 Mg/M2 on days 1, 2 and 3 with Cosela before the chemotherapy and Imfinzi 1500 Mg IV every 3 weeks on day 1.  Status post 1 cycle.  The patient tolerated the first cycle of her treatment fairly well with no concerning adverse effect except for mild fatigue and the baseline shortness of breath. I recommended for her to proceed with cycle #2 today as planned. I will see her back for follow-up visit in 3 weeks for evaluation before starting cycle #3 with repeat CT scan of the chest, abdomen pelvis for restaging of her disease. The patient was advised to call immediately if she has any concerning symptoms in the interval. The patient voices understanding of current disease status and treatment options and is in agreement with the current care plan.  All questions were answered. The patient knows to call the clinic with any problems, questions or concerns. We can certainly see the patient much sooner if necessary.  The total time spent in the appointment was 30 minutes.  Disclaimer: This note was dictated with voice recognition software. Similar sounding words can inadvertently be transcribed and may not be corrected upon review.

## 2020-10-11 ENCOUNTER — Inpatient Hospital Stay: Payer: Medicare HMO

## 2020-10-11 VITALS — BP 101/50 | HR 60 | Temp 98.0°F | Resp 18

## 2020-10-11 DIAGNOSIS — J9621 Acute and chronic respiratory failure with hypoxia: Secondary | ICD-10-CM | POA: Diagnosis not present

## 2020-10-11 DIAGNOSIS — C3432 Malignant neoplasm of lower lobe, left bronchus or lung: Secondary | ICD-10-CM

## 2020-10-11 DIAGNOSIS — Z79899 Other long term (current) drug therapy: Secondary | ICD-10-CM | POA: Diagnosis not present

## 2020-10-11 DIAGNOSIS — Z7951 Long term (current) use of inhaled steroids: Secondary | ICD-10-CM | POA: Diagnosis not present

## 2020-10-11 DIAGNOSIS — G4733 Obstructive sleep apnea (adult) (pediatric): Secondary | ICD-10-CM | POA: Diagnosis not present

## 2020-10-11 DIAGNOSIS — Z5111 Encounter for antineoplastic chemotherapy: Secondary | ICD-10-CM | POA: Diagnosis not present

## 2020-10-11 DIAGNOSIS — J8410 Pulmonary fibrosis, unspecified: Secondary | ICD-10-CM | POA: Diagnosis not present

## 2020-10-11 DIAGNOSIS — C787 Secondary malignant neoplasm of liver and intrahepatic bile duct: Secondary | ICD-10-CM | POA: Diagnosis not present

## 2020-10-11 DIAGNOSIS — J449 Chronic obstructive pulmonary disease, unspecified: Secondary | ICD-10-CM | POA: Diagnosis not present

## 2020-10-11 MED ORDER — DEXAMETHASONE SODIUM PHOSPHATE 100 MG/10ML IJ SOLN
10.0000 mg | Freq: Once | INTRAMUSCULAR | Status: AC
  Administered 2020-10-11: 10 mg via INTRAVENOUS
  Filled 2020-10-11: qty 10

## 2020-10-11 MED ORDER — TRILACICLIB DIHYDROCHLORIDE INJECTION 300 MG
240.0000 mg/m2 | Freq: Once | INTRAVENOUS | Status: AC
  Administered 2020-10-11: 405 mg via INTRAVENOUS
  Filled 2020-10-11: qty 27

## 2020-10-11 MED ORDER — SODIUM CHLORIDE 0.9 % IV SOLN
Freq: Once | INTRAVENOUS | Status: AC
  Filled 2020-10-11: qty 250

## 2020-10-11 MED ORDER — HEPARIN SOD (PORK) LOCK FLUSH 100 UNIT/ML IV SOLN
500.0000 [IU] | Freq: Once | INTRAVENOUS | Status: AC | PRN
  Administered 2020-10-11: 500 [IU]
  Filled 2020-10-11: qty 5

## 2020-10-11 MED ORDER — SODIUM CHLORIDE 0.9% FLUSH
10.0000 mL | INTRAVENOUS | Status: DC | PRN
  Administered 2020-10-11: 10 mL
  Filled 2020-10-11: qty 10

## 2020-10-11 MED ORDER — SODIUM CHLORIDE 0.9 % IV SOLN
100.0000 mg/m2 | Freq: Once | INTRAVENOUS | Status: AC
  Administered 2020-10-11: 170 mg via INTRAVENOUS
  Filled 2020-10-11: qty 8.5

## 2020-10-11 NOTE — Patient Instructions (Addendum)
[facility]  Discharge Instructions: Thank you for choosing [facility] to provide your oncology and hematology care.   If you have a lab appointment with the Cancer Center, please go directly to the Cancer Center and check in at the registration area.   Wear comfortable clothing and clothing appropriate for easy access to any Portacath or PICC line.   We strive to give you quality time with your provider. You may need to reschedule your appointment if you arrive late (15 or more minutes).  Arriving late affects you and other patients whose appointments are after yours.  Also, if you miss three or more appointments without notifying the office, you may be dismissed from the clinic at the provider's discretion.      For prescription refill requests, have your pharmacy contact our office and allow 72 hours for refills to be completed.    Today you received the following chemotherapy and/or immunotherapy agents: etoposide.     To help prevent nausea and vomiting after your treatment, we encourage you to take your nausea medication as directed.  BELOW ARE SYMPTOMS THAT SHOULD BE REPORTED IMMEDIATELY: . *FEVER GREATER THAN 100.4 F (38 C) OR HIGHER . *CHILLS OR SWEATING . *NAUSEA AND VOMITING THAT IS NOT CONTROLLED WITH YOUR NAUSEA MEDICATION . *UNUSUAL SHORTNESS OF BREATH . *UNUSUAL BRUISING OR BLEEDING . *URINARY PROBLEMS (pain or burning when urinating, or frequent urination) . *BOWEL PROBLEMS (unusual diarrhea, constipation, pain near the anus) . TENDERNESS IN MOUTH AND THROAT WITH OR WITHOUT PRESENCE OF ULCERS (sore throat, sores in mouth, or a toothache) . UNUSUAL RASH, SWELLING OR PAIN  . UNUSUAL VAGINAL DISCHARGE OR ITCHING   Items with * indicate a potential emergency and should be followed up as soon as possible or go to the Emergency Department if any problems should occur.  Please show the CHEMOTHERAPY ALERT CARD or IMMUNOTHERAPY ALERT  CARD at check-in to the Emergency Department and triage nurse.  Should you have questions after your visit or need to cancel or reschedule your appointment, please contact [facility]  Dept: [phone]  and follow the prompts.  Office hours are 8:00 a.m. to 4:30 p.m. Monday - Friday. Please note that voicemails left after 4:00 p.m. may not be returned until the following business day.  We are closed weekends and major holidays. You have access to a nurse at all times for urgent questions. Please call the main number to the clinic Dept: [phone] and follow the prompts.   For any non-urgent questions, you may also contact your provider using MyChart. We now offer e-Visits for anyone 18 and older to request care online for non-urgent symptoms. For details visit [email or web address].   Also download the MyChart app! Go to the app store, search "MyChart", open the app, select , and log in with your MyChart username and password.  Due to Covid, a mask is required upon entering the hospital/clinic. If you do not have a mask, one will be given to you upon arrival. For doctor visits, patients may have 1 support person aged 18 or older with them. For treatment visits, patients cannot have anyone with them due to current Covid guidelines and our immunocompromised population.   

## 2020-10-12 ENCOUNTER — Inpatient Hospital Stay: Payer: Medicare HMO

## 2020-10-12 ENCOUNTER — Other Ambulatory Visit: Payer: Self-pay

## 2020-10-12 VITALS — BP 112/47 | HR 64 | Temp 98.2°F | Resp 16

## 2020-10-12 DIAGNOSIS — Z7951 Long term (current) use of inhaled steroids: Secondary | ICD-10-CM | POA: Diagnosis not present

## 2020-10-12 DIAGNOSIS — C3432 Malignant neoplasm of lower lobe, left bronchus or lung: Secondary | ICD-10-CM | POA: Diagnosis not present

## 2020-10-12 DIAGNOSIS — Z79899 Other long term (current) drug therapy: Secondary | ICD-10-CM | POA: Diagnosis not present

## 2020-10-12 DIAGNOSIS — Z5111 Encounter for antineoplastic chemotherapy: Secondary | ICD-10-CM | POA: Diagnosis not present

## 2020-10-12 DIAGNOSIS — C787 Secondary malignant neoplasm of liver and intrahepatic bile duct: Secondary | ICD-10-CM | POA: Diagnosis not present

## 2020-10-12 MED ORDER — TRILACICLIB DIHYDROCHLORIDE INJECTION 300 MG
240.0000 mg/m2 | Freq: Once | INTRAVENOUS | Status: AC
  Administered 2020-10-12: 405 mg via INTRAVENOUS
  Filled 2020-10-12: qty 27

## 2020-10-12 MED ORDER — DEXAMETHASONE SODIUM PHOSPHATE 100 MG/10ML IJ SOLN
10.0000 mg | Freq: Once | INTRAMUSCULAR | Status: AC
  Administered 2020-10-12: 10 mg via INTRAVENOUS
  Filled 2020-10-12: qty 10

## 2020-10-12 MED ORDER — SODIUM CHLORIDE 0.9 % IV SOLN
100.0000 mg/m2 | Freq: Once | INTRAVENOUS | Status: AC
  Administered 2020-10-12: 170 mg via INTRAVENOUS
  Filled 2020-10-12: qty 8.5

## 2020-10-12 MED ORDER — SODIUM CHLORIDE 0.9% FLUSH
10.0000 mL | INTRAVENOUS | Status: DC | PRN
  Administered 2020-10-12: 10 mL
  Filled 2020-10-12: qty 10

## 2020-10-12 MED ORDER — SODIUM CHLORIDE 0.9 % IV SOLN
Freq: Once | INTRAVENOUS | Status: AC
Start: 2020-10-12 — End: 2020-10-12
  Filled 2020-10-12: qty 250

## 2020-10-12 MED ORDER — HEPARIN SOD (PORK) LOCK FLUSH 100 UNIT/ML IV SOLN
500.0000 [IU] | Freq: Once | INTRAVENOUS | Status: AC | PRN
Start: 2020-10-12 — End: 2020-10-12
  Administered 2020-10-12: 500 [IU]
  Filled 2020-10-12: qty 5

## 2020-10-12 NOTE — Patient Instructions (Signed)
[facility]  Discharge Instructions: Thank you for choosing [facility] to provide your oncology and hematology care.   If you have a lab appointment with the Cancer Center, please go directly to the Cancer Center and check in at the registration area.   Wear comfortable clothing and clothing appropriate for easy access to any Portacath or PICC line.   We strive to give you quality time with your provider. You may need to reschedule your appointment if you arrive late (15 or more minutes).  Arriving late affects you and other patients whose appointments are after yours.  Also, if you miss three or more appointments without notifying the office, you may be dismissed from the clinic at the provider's discretion.      For prescription refill requests, have your pharmacy contact our office and allow 72 hours for refills to be completed.    Today you received the following chemotherapy and/or immunotherapy agents: etoposide.     To help prevent nausea and vomiting after your treatment, we encourage you to take your nausea medication as directed.  BELOW ARE SYMPTOMS THAT SHOULD BE REPORTED IMMEDIATELY: . *FEVER GREATER THAN 100.4 F (38 C) OR HIGHER . *CHILLS OR SWEATING . *NAUSEA AND VOMITING THAT IS NOT CONTROLLED WITH YOUR NAUSEA MEDICATION . *UNUSUAL SHORTNESS OF BREATH . *UNUSUAL BRUISING OR BLEEDING . *URINARY PROBLEMS (pain or burning when urinating, or frequent urination) . *BOWEL PROBLEMS (unusual diarrhea, constipation, pain near the anus) . TENDERNESS IN MOUTH AND THROAT WITH OR WITHOUT PRESENCE OF ULCERS (sore throat, sores in mouth, or a toothache) . UNUSUAL RASH, SWELLING OR PAIN  . UNUSUAL VAGINAL DISCHARGE OR ITCHING   Items with * indicate a potential emergency and should be followed up as soon as possible or go to the Emergency Department if any problems should occur.  Please show the CHEMOTHERAPY ALERT CARD or IMMUNOTHERAPY ALERT  CARD at check-in to the Emergency Department and triage nurse.  Should you have questions after your visit or need to cancel or reschedule your appointment, please contact [facility]  Dept: [phone]  and follow the prompts.  Office hours are 8:00 a.m. to 4:30 p.m. Monday - Friday. Please note that voicemails left after 4:00 p.m. may not be returned until the following business day.  We are closed weekends and major holidays. You have access to a nurse at all times for urgent questions. Please call the main number to the clinic Dept: [phone] and follow the prompts.   For any non-urgent questions, you may also contact your provider using MyChart. We now offer e-Visits for anyone 18 and older to request care online for non-urgent symptoms. For details visit [email or web address].   Also download the MyChart app! Go to the app store, search "MyChart", open the app, select , and log in with your MyChart username and password.  Due to Covid, a mask is required upon entering the hospital/clinic. If you do not have a mask, one will be given to you upon arrival. For doctor visits, patients may have 1 support person aged 18 or older with them. For treatment visits, patients cannot have anyone with them due to current Covid guidelines and our immunocompromised population.   

## 2020-10-17 ENCOUNTER — Inpatient Hospital Stay: Payer: Medicare HMO

## 2020-10-17 ENCOUNTER — Other Ambulatory Visit: Payer: Self-pay

## 2020-10-17 DIAGNOSIS — C787 Secondary malignant neoplasm of liver and intrahepatic bile duct: Secondary | ICD-10-CM | POA: Diagnosis not present

## 2020-10-17 DIAGNOSIS — Z5111 Encounter for antineoplastic chemotherapy: Secondary | ICD-10-CM | POA: Diagnosis not present

## 2020-10-17 DIAGNOSIS — C3432 Malignant neoplasm of lower lobe, left bronchus or lung: Secondary | ICD-10-CM

## 2020-10-17 DIAGNOSIS — Z79899 Other long term (current) drug therapy: Secondary | ICD-10-CM | POA: Diagnosis not present

## 2020-10-17 DIAGNOSIS — Z7951 Long term (current) use of inhaled steroids: Secondary | ICD-10-CM | POA: Diagnosis not present

## 2020-10-17 LAB — CBC WITH DIFFERENTIAL (CANCER CENTER ONLY)
Abs Immature Granulocytes: 0.04 10*3/uL (ref 0.00–0.07)
Basophils Absolute: 0 10*3/uL (ref 0.0–0.1)
Basophils Relative: 1 %
Eosinophils Absolute: 0 10*3/uL (ref 0.0–0.5)
Eosinophils Relative: 1 %
HCT: 28.3 % — ABNORMAL LOW (ref 36.0–46.0)
Hemoglobin: 9.2 g/dL — ABNORMAL LOW (ref 12.0–15.0)
Immature Granulocytes: 1 %
Lymphocytes Relative: 23 %
Lymphs Abs: 0.8 10*3/uL (ref 0.7–4.0)
MCH: 29.8 pg (ref 26.0–34.0)
MCHC: 32.5 g/dL (ref 30.0–36.0)
MCV: 91.6 fL (ref 80.0–100.0)
Monocytes Absolute: 0.1 10*3/uL (ref 0.1–1.0)
Monocytes Relative: 2 %
Neutro Abs: 2.6 10*3/uL (ref 1.7–7.7)
Neutrophils Relative %: 72 %
Platelet Count: 249 10*3/uL (ref 150–400)
RBC: 3.09 MIL/uL — ABNORMAL LOW (ref 3.87–5.11)
RDW: 14.6 % (ref 11.5–15.5)
WBC Count: 3.6 10*3/uL — ABNORMAL LOW (ref 4.0–10.5)
nRBC: 0 % (ref 0.0–0.2)

## 2020-10-17 LAB — CMP (CANCER CENTER ONLY)
ALT: 16 U/L (ref 0–44)
AST: 18 U/L (ref 15–41)
Albumin: 3.4 g/dL — ABNORMAL LOW (ref 3.5–5.0)
Alkaline Phosphatase: 50 U/L (ref 38–126)
Anion gap: 7 (ref 5–15)
BUN: 13 mg/dL (ref 8–23)
CO2: 30 mmol/L (ref 22–32)
Calcium: 9.6 mg/dL (ref 8.9–10.3)
Chloride: 100 mmol/L (ref 98–111)
Creatinine: 0.67 mg/dL (ref 0.44–1.00)
GFR, Estimated: >60 mL/min (ref >60)
Glucose, Bld: 79 mg/dL (ref 70–99)
Potassium: 4.4 mmol/L (ref 3.5–5.1)
Sodium: 137 mmol/L (ref 135–145)
Total Bilirubin: 0.5 mg/dL (ref 0.3–1.2)
Total Protein: 6.1 g/dL — ABNORMAL LOW (ref 6.5–8.1)

## 2020-10-24 ENCOUNTER — Other Ambulatory Visit: Payer: Self-pay

## 2020-10-24 ENCOUNTER — Inpatient Hospital Stay: Payer: Medicare HMO

## 2020-10-24 DIAGNOSIS — Z7951 Long term (current) use of inhaled steroids: Secondary | ICD-10-CM | POA: Diagnosis not present

## 2020-10-24 DIAGNOSIS — C3432 Malignant neoplasm of lower lobe, left bronchus or lung: Secondary | ICD-10-CM | POA: Diagnosis not present

## 2020-10-24 DIAGNOSIS — C787 Secondary malignant neoplasm of liver and intrahepatic bile duct: Secondary | ICD-10-CM | POA: Diagnosis not present

## 2020-10-24 DIAGNOSIS — Z5111 Encounter for antineoplastic chemotherapy: Secondary | ICD-10-CM | POA: Diagnosis not present

## 2020-10-24 DIAGNOSIS — Z79899 Other long term (current) drug therapy: Secondary | ICD-10-CM | POA: Diagnosis not present

## 2020-10-24 LAB — CBC WITH DIFFERENTIAL (CANCER CENTER ONLY)
Abs Immature Granulocytes: 0 10*3/uL (ref 0.00–0.07)
Basophils Absolute: 0 10*3/uL (ref 0.0–0.1)
Basophils Relative: 1 %
Eosinophils Absolute: 0.1 10*3/uL (ref 0.0–0.5)
Eosinophils Relative: 2 %
HCT: 26.7 % — ABNORMAL LOW (ref 36.0–46.0)
Hemoglobin: 8.7 g/dL — ABNORMAL LOW (ref 12.0–15.0)
Immature Granulocytes: 0 %
Lymphocytes Relative: 35 %
Lymphs Abs: 0.8 10*3/uL (ref 0.7–4.0)
MCH: 30.1 pg (ref 26.0–34.0)
MCHC: 32.6 g/dL (ref 30.0–36.0)
MCV: 92.4 fL (ref 80.0–100.0)
Monocytes Absolute: 0.4 10*3/uL (ref 0.1–1.0)
Monocytes Relative: 19 %
Neutro Abs: 0.9 10*3/uL — ABNORMAL LOW (ref 1.7–7.7)
Neutrophils Relative %: 43 %
Platelet Count: 106 10*3/uL — ABNORMAL LOW (ref 150–400)
RBC: 2.89 MIL/uL — ABNORMAL LOW (ref 3.87–5.11)
RDW: 15.2 % (ref 11.5–15.5)
WBC Count: 2.2 10*3/uL — ABNORMAL LOW (ref 4.0–10.5)
nRBC: 0 % (ref 0.0–0.2)

## 2020-10-24 LAB — CMP (CANCER CENTER ONLY)
ALT: 12 U/L (ref 0–44)
AST: 19 U/L (ref 15–41)
Albumin: 3.3 g/dL — ABNORMAL LOW (ref 3.5–5.0)
Alkaline Phosphatase: 46 U/L (ref 38–126)
Anion gap: 10 (ref 5–15)
BUN: 12 mg/dL (ref 8–23)
CO2: 29 mmol/L (ref 22–32)
Calcium: 9.3 mg/dL (ref 8.9–10.3)
Chloride: 101 mmol/L (ref 98–111)
Creatinine: 0.7 mg/dL (ref 0.44–1.00)
GFR, Estimated: >60 mL/min (ref >60)
Glucose, Bld: 86 mg/dL (ref 70–99)
Potassium: 4.2 mmol/L (ref 3.5–5.1)
Sodium: 140 mmol/L (ref 135–145)
Total Bilirubin: 0.3 mg/dL (ref 0.3–1.2)
Total Protein: 6.1 g/dL — ABNORMAL LOW (ref 6.5–8.1)

## 2020-10-26 ENCOUNTER — Ambulatory Visit (HOSPITAL_COMMUNITY)
Admission: RE | Admit: 2020-10-26 | Discharge: 2020-10-26 | Disposition: A | Discharge status: Home / Self Care | Payer: Medicare HMO | Source: Ambulatory Visit | Attending: Internal Medicine | Admitting: Internal Medicine

## 2020-10-26 ENCOUNTER — Other Ambulatory Visit: Payer: Self-pay

## 2020-10-26 ENCOUNTER — Encounter (HOSPITAL_COMMUNITY): Payer: Self-pay

## 2020-10-26 DIAGNOSIS — J432 Centrilobular emphysema: Secondary | ICD-10-CM | POA: Diagnosis not present

## 2020-10-26 DIAGNOSIS — K3189 Other diseases of stomach and duodenum: Secondary | ICD-10-CM | POA: Diagnosis not present

## 2020-10-26 DIAGNOSIS — C3490 Malignant neoplasm of unspecified part of unspecified bronchus or lung: Secondary | ICD-10-CM | POA: Diagnosis not present

## 2020-10-26 DIAGNOSIS — R918 Other nonspecific abnormal finding of lung field: Secondary | ICD-10-CM | POA: Diagnosis not present

## 2020-10-26 DIAGNOSIS — C787 Secondary malignant neoplasm of liver and intrahepatic bile duct: Secondary | ICD-10-CM | POA: Diagnosis not present

## 2020-10-26 DIAGNOSIS — I2510 Atherosclerotic heart disease of native coronary artery without angina pectoris: Secondary | ICD-10-CM | POA: Diagnosis not present

## 2020-10-26 MED ORDER — IOHEXOL 350 MG/ML SOLN
80.0000 mL | Freq: Once | INTRAVENOUS | Status: AC | PRN
  Administered 2020-10-26: 80 mL via INTRAVENOUS

## 2020-10-30 ENCOUNTER — Other Ambulatory Visit: Payer: Self-pay

## 2020-10-30 ENCOUNTER — Inpatient Hospital Stay: Payer: Medicare HMO

## 2020-10-30 ENCOUNTER — Encounter: Payer: Self-pay | Admitting: Internal Medicine

## 2020-10-30 ENCOUNTER — Telehealth: Payer: Self-pay | Admitting: Internal Medicine

## 2020-10-30 ENCOUNTER — Inpatient Hospital Stay (HOSPITAL_BASED_OUTPATIENT_CLINIC_OR_DEPARTMENT_OTHER): Payer: Medicare HMO | Admitting: Internal Medicine

## 2020-10-30 VITALS — BP 106/54 | HR 61 | Temp 97.5°F | Resp 20 | Ht 60.0 in | Wt 143.3 lb

## 2020-10-30 DIAGNOSIS — C3432 Malignant neoplasm of lower lobe, left bronchus or lung: Secondary | ICD-10-CM

## 2020-10-30 DIAGNOSIS — I700 Atherosclerosis of aorta: Secondary | ICD-10-CM | POA: Diagnosis not present

## 2020-10-30 DIAGNOSIS — J439 Emphysema, unspecified: Secondary | ICD-10-CM | POA: Diagnosis not present

## 2020-10-30 DIAGNOSIS — Z5111 Encounter for antineoplastic chemotherapy: Secondary | ICD-10-CM | POA: Diagnosis not present

## 2020-10-30 DIAGNOSIS — C787 Secondary malignant neoplasm of liver and intrahepatic bile duct: Secondary | ICD-10-CM | POA: Diagnosis not present

## 2020-10-30 DIAGNOSIS — Z5112 Encounter for antineoplastic immunotherapy: Secondary | ICD-10-CM

## 2020-10-30 DIAGNOSIS — Z7951 Long term (current) use of inhaled steroids: Secondary | ICD-10-CM | POA: Diagnosis not present

## 2020-10-30 DIAGNOSIS — Z79899 Other long term (current) drug therapy: Secondary | ICD-10-CM | POA: Diagnosis not present

## 2020-10-30 DIAGNOSIS — Z95828 Presence of other vascular implants and grafts: Secondary | ICD-10-CM

## 2020-10-30 LAB — CBC WITH DIFFERENTIAL (CANCER CENTER ONLY)
Abs Immature Granulocytes: 0.04 10*3/uL (ref 0.00–0.07)
Basophils Absolute: 0 10*3/uL (ref 0.0–0.1)
Basophils Relative: 1 %
Eosinophils Absolute: 0.1 10*3/uL (ref 0.0–0.5)
Eosinophils Relative: 4 %
HCT: 27.4 % — ABNORMAL LOW (ref 36.0–46.0)
Hemoglobin: 9 g/dL — ABNORMAL LOW (ref 12.0–15.0)
Immature Granulocytes: 1 %
Lymphocytes Relative: 39 %
Lymphs Abs: 1.2 10*3/uL (ref 0.7–4.0)
MCH: 30.8 pg (ref 26.0–34.0)
MCHC: 32.8 g/dL (ref 30.0–36.0)
MCV: 93.8 fL (ref 80.0–100.0)
Monocytes Absolute: 0.5 10*3/uL (ref 0.1–1.0)
Monocytes Relative: 17 %
Neutro Abs: 1.2 10*3/uL — ABNORMAL LOW (ref 1.7–7.7)
Neutrophils Relative %: 38 %
Platelet Count: 152 10*3/uL (ref 150–400)
RBC: 2.92 MIL/uL — ABNORMAL LOW (ref 3.87–5.11)
RDW: 16.8 % — ABNORMAL HIGH (ref 11.5–15.5)
WBC Count: 3 10*3/uL — ABNORMAL LOW (ref 4.0–10.5)
nRBC: 0 % (ref 0.0–0.2)

## 2020-10-30 LAB — CMP (CANCER CENTER ONLY)
ALT: 13 U/L (ref 0–44)
AST: 18 U/L (ref 15–41)
Albumin: 3.5 g/dL (ref 3.5–5.0)
Alkaline Phosphatase: 43 U/L (ref 38–126)
Anion gap: 8 (ref 5–15)
BUN: 14 mg/dL (ref 8–23)
CO2: 31 mmol/L (ref 22–32)
Calcium: 9.8 mg/dL (ref 8.9–10.3)
Chloride: 101 mmol/L (ref 98–111)
Creatinine: 0.72 mg/dL (ref 0.44–1.00)
GFR, Estimated: >60 mL/min (ref >60)
Glucose, Bld: 79 mg/dL (ref 70–99)
Potassium: 4.1 mmol/L (ref 3.5–5.1)
Sodium: 140 mmol/L (ref 135–145)
Total Bilirubin: 0.3 mg/dL (ref 0.3–1.2)
Total Protein: 6 g/dL — ABNORMAL LOW (ref 6.5–8.1)

## 2020-10-30 MED ORDER — SODIUM CHLORIDE 0.9 % IV SOLN
340.0000 mg | Freq: Once | INTRAVENOUS | Status: AC
  Administered 2020-10-30: 340 mg via INTRAVENOUS
  Filled 2020-10-30: qty 34

## 2020-10-30 MED ORDER — SODIUM CHLORIDE 0.9 % IV SOLN
Freq: Once | INTRAVENOUS | Status: AC
  Filled 2020-10-30: qty 250

## 2020-10-30 MED ORDER — PALONOSETRON HCL INJECTION 0.25 MG/5ML
INTRAVENOUS | Status: AC
  Filled 2020-10-30: qty 5

## 2020-10-30 MED ORDER — SODIUM CHLORIDE 0.9% FLUSH
10.0000 mL | INTRAVENOUS | Status: DC | PRN
Start: 2020-10-30 — End: 2020-10-30
  Administered 2020-10-30: 10 mL
  Filled 2020-10-30: qty 10

## 2020-10-30 MED ORDER — SODIUM CHLORIDE 0.9 % IV SOLN
10.0000 mg | Freq: Once | INTRAVENOUS | Status: AC
  Administered 2020-10-30: 10 mg via INTRAVENOUS
  Filled 2020-10-30: qty 10

## 2020-10-30 MED ORDER — SODIUM CHLORIDE 0.9 % IV SOLN
100.0000 mg/m2 | Freq: Once | INTRAVENOUS | Status: AC
  Administered 2020-10-30: 170 mg via INTRAVENOUS
  Filled 2020-10-30: qty 8.5

## 2020-10-30 MED ORDER — SODIUM CHLORIDE 0.9% FLUSH
10.0000 mL | Freq: Once | INTRAVENOUS | Status: AC
  Administered 2020-10-30: 10 mL
  Filled 2020-10-30: qty 10

## 2020-10-30 MED ORDER — DEXTROSE 5 % IV SOLN
240.0000 mg/m2 | Freq: Once | INTRAVENOUS | Status: AC
  Administered 2020-10-30: 405 mg via INTRAVENOUS
  Filled 2020-10-30: qty 27

## 2020-10-30 MED ORDER — HEPARIN SOD (PORK) LOCK FLUSH 100 UNIT/ML IV SOLN
500.0000 [IU] | Freq: Once | INTRAVENOUS | Status: AC | PRN
  Administered 2020-10-30: 500 [IU]
  Filled 2020-10-30: qty 5

## 2020-10-30 MED ORDER — SODIUM CHLORIDE 0.9 % IV SOLN
150.0000 mg | Freq: Once | INTRAVENOUS | Status: AC
  Administered 2020-10-30: 150 mg via INTRAVENOUS
  Filled 2020-10-30: qty 150

## 2020-10-30 MED ORDER — PALONOSETRON HCL INJECTION 0.25 MG/5ML
0.2500 mg | Freq: Once | INTRAVENOUS | Status: AC
  Administered 2020-10-30: 0.25 mg via INTRAVENOUS

## 2020-10-30 NOTE — Progress Notes (Signed)
[place] Telephone:([phone]   [phone]  OFFICE PROGRESS NOTE  Biagio Borg, MD [place] [address]  DIAGNOSIS: Extensive stage (T3, N2, M1 C) small cell lung cancer presented with large left lower lobe lung mass in addition to right hilar and mediastinal lymphadenopathy as well as satellite nodule in the left lower lobe and peripheral pleural lesion as well as metastatic liver lesions diagnosed in May 2022.  PRIOR THERAPY: None  CURRENT THERAPY: Systemic chemotherapy with carboplatin for AUC of 5 from day 1, etoposide 100 Mg/M2 on days 1, 2 and 3 with Cosela before the chemotherapy as well as Imfinzi 1500 Mg IV on day 1 every 3 weeks.  Status post 2 cycles.  First cycle started September 18, 2020  INTERVAL HISTORY: Ashanty Coltrane 85 y.o. female returns to the clinic today for follow-up visit.  The patient is feeling fine today with no concerning complaints except for the baseline shortness of breath and she is currently on home oxygen.  She also has mild fatigue.  She denied having any chest pain, cough or hemoptysis.  She denied having any nausea, vomiting, diarrhea or constipation.  She has no headache or visual changes.  She has been tolerating her systemic chemotherapy fairly well.  The patient had repeat CT scan of the chest, abdomen pelvis performed recently and she is here for evaluation and discussion of her scan results and treatment options.  MEDICAL HISTORY: Past Medical History:  Diagnosis Date   Abnormal heart rhythm    Aortic aneurysm (HCC)    Aortic atherosclerosis ([facility]) 03/03/2019   Dyspnea    Hypothyroid    OSA (obstructive sleep apnea)    on cpap   Psoriasis 02/05/2017   Pulmonary fibrosis (HCC)    SVT (supraventricular tachycardia) (HCC)    Thyroid disease    hypo    ALLERGIES:  is allergic to lipitor [atorvastatin], codeine, hydromorphone hcl, levaquin [levofloxacin in d5w], morphine,  oxycodone-acetaminophen, propoxyphene n-acetaminophen, and simvastatin.  MEDICATIONS:  Current Outpatient Medications  Medication Sig Dispense Refill   acetaminophen (TYLENOL) 500 MG tablet Take 500 mg by mouth every 6 (six) hours as needed.     Ascorbic Acid (VITAMIN C PO) Take by mouth.     Azelastine-Fluticasone 137-50 MCG/ACT SUSP Place 1 spray into the nose every 12 (twelve) hours. 23 g 5   Blood Glucose Monitoring Suppl (TRUE METRIX METER) w/Device KIT USE AS DIRECTED 1 kit 0   CALCIUM-MAGNESIUM-ZINC PO Take 1 tablet by mouth daily.     cholecalciferol (VITAMIN D) 1000 units tablet Take 5,000 Units by mouth daily.     diltiazem (CARDIZEM CD) 180 MG 24 hr capsule Take 1 capsule (180 mg total) by mouth daily. 90 capsule 2   escitalopram (LEXAPRO) 20 MG tablet Take 1 tablet (20 mg total) by mouth at bedtime. 90 tablet 3   fenofibrate 160 MG tablet TAKE 1 TABLET EVERY DAY 90 tablet 1   fluocinonide (LIDEX) 0.05 % external solution APP EXT AA OF SCALP NIGHTLY  3   furosemide (LASIX) 80 MG tablet Take 1 tablet (80 mg total) by mouth daily. 90 tablet 3   guaiFENesin (MUCINEX) 600 MG 12 hr tablet Take 2 tablets (1,200 mg total) by mouth 2 (two) times daily as needed. (Patient not taking: Reported on 10/10/2020) 60 tablet 2   HYDROcodone-acetaminophen (NORCO/VICODIN) 5-325 MG tablet Take 1 tablet by mouth every 6 (six) hours as needed. 30 tablet 0  ipratropium-albuterol (DUONEB) 0.5-2.5 (3) MG/3ML SOLN Take 3 mLs by nebulization every 4 (four) hours as needed. 360 mL 0   levothyroxine (SYNTHROID) 100 MCG tablet TAKE 1 TABLET EVERY DAY BEFORE BREAKFAST 90 tablet 2   lidocaine-prilocaine (EMLA) cream 30 Squeeze  1/2 tsp on a cotton ball and apply to skin over port a cath site. Do not rub it in. Cover with plastic wrap. Apply 1-2 hours prior to your treatment. (Patient not taking: Reported on 10/10/2020) 30 g 0   lisinopril (ZESTRIL) 5 MG tablet Take 1 tablet (5 mg total) by mouth daily. 90 tablet 1    potassium chloride SA (KLOR-CON) 20 MEQ tablet TAKE 1 TABLET EVERY DAY WITH FOOD 90 tablet 3   prochlorperazine (COMPAZINE) 10 MG tablet Take 1 tablet (10 mg total) by mouth every 6 (six) hours as needed for nausea or vomiting. (Patient not taking: Reported on 10/10/2020) 30 tablet 0   rosuvastatin (CRESTOR) 20 MG tablet Take 1 tablet (20 mg total) by mouth daily. 90 tablet 3   tiZANidine (ZANAFLEX) 2 MG tablet TAKE 1 TABLET THREE TIMES DAILY 270 tablet 1   traMADol (ULTRAM) 50 MG tablet Take 1 tablet (50 mg total) by mouth 4 (four) times daily. 360 tablet 1   traZODone (DESYREL) 50 MG tablet Take 1 tablet (50 mg total) by mouth at bedtime as needed for sleep. 90 tablet 1   vitamin B-12 (CYANOCOBALAMIN) 1000 MCG tablet Take 500 mcg by mouth daily.     vitamin C (ASCORBIC ACID) 500 MG tablet Take 500 mg by mouth daily.     Vitamin D, Ergocalciferol, (DRISDOL) 50000 units CAPS capsule Take 1 capsule (50,000 Units total) by mouth every 7 (seven) days. 12 capsule 0   vitamin E 400 UNIT capsule Take 200 Units by mouth daily.     No current facility-administered medications for this visit.    SURGICAL HISTORY:  Past Surgical History:  Procedure Laterality Date   ABDOMINAL HYSTERECTOMY     APPENDECTOMY     CHOLECYSTECTOMY     CRANIOTOMY     for aneurysms   CRANIOTOMY  1980 x 2   aneurysmal clipping   HERNIA REPAIR     IR IMAGING GUIDED PORT INSERTION  09/15/2020   TOTAL ABDOMINAL HYSTERECTOMY     TUBAL LIGATION      REVIEW OF SYSTEMS:  Constitutional: positive for fatigue Eyes: negative Ears, nose, mouth, throat, and face: negative Respiratory: positive for dyspnea on exertion Cardiovascular: negative Gastrointestinal: negative Genitourinary:negative Integument/breast: negative Hematologic/lymphatic: negative Musculoskeletal:positive for muscle weakness Neurological: negative Behavioral/Psych: negative Endocrine: negative Allergic/Immunologic: negative   PHYSICAL EXAMINATION:  General appearance: alert, cooperative, appears older than stated age, and no distress Head: Normocephalic, without obvious abnormality, atraumatic Neck: no adenopathy, no JVD, supple, symmetrical, trachea midline, and thyroid not enlarged, symmetric, no tenderness/mass/nodules Lymph nodes: Cervical, supraclavicular, and axillary nodes normal. Resp: clear to auscultation bilaterally Back: symmetric, no curvature. ROM normal. No CVA tenderness. Cardio: regular rate and rhythm, S1, S2 normal, no murmur, click, rub or gallop GI: soft, non-tender; bowel sounds normal; no masses,  no organomegaly Extremities: extremities normal, atraumatic, no cyanosis or edema Neurologic: Alert and oriented X 3, normal strength and tone. Normal symmetric reflexes. Normal coordination and gait  ECOG PERFORMANCE STATUS: 1 - Symptomatic but completely ambulatory  Blood pressure (!) 106/54, pulse 61, temperature (!) 97.5 F (36.4 C), temperature source Tympanic, resp. rate 20, height 5' (1.524 m), weight 143 lb 4.8 oz (65 kg), SpO2 96 %.  LABORATORY DATA: Lab Results  Component Value Date   WBC 3.0 (L) 10/30/2020   HGB 9.0 (L) 10/30/2020   HCT 27.4 (L) 10/30/2020   MCV 93.8 10/30/2020   PLT 152 10/30/2020      Chemistry      Component Value Date/Time   NA 140 10/30/2020 0910   K 4.1 10/30/2020 0910   CL 101 10/30/2020 0910   CO2 31 10/30/2020 0910   BUN 14 10/30/2020 0910   CREATININE 0.72 10/30/2020 0910   CREATININE 1.50 (H) 12/17/2019 1519      Component Value Date/Time   CALCIUM 9.8 10/30/2020 0910   ALKPHOS 43 10/30/2020 0910   AST 18 10/30/2020 0910   ALT 13 10/30/2020 0910   BILITOT 0.3 10/30/2020 0910       RADIOGRAPHIC STUDIES: CT Chest W Contrast  Result Date: 10/28/2020 CLINICAL DATA:  Small-cell lung cancer.  Staging. EXAM: CT CHEST, ABDOMEN, AND PELVIS WITH CONTRAST TECHNIQUE: Multidetector CT imaging of the chest, abdomen and pelvis was performed following the standard protocol  during bolus administration of intravenous contrast. CONTRAST:  66m OMNIPAQUE IOHEXOL 350 MG/ML SOLN COMPARISON:  PET-CT 09/05/2020 FINDINGS: CT CHEST FINDINGS Cardiovascular: The heart size is normal. No substantial pericardial effusion. Moderate coronary artery calcification is evident. Moderate atherosclerotic calcification is noted in the wall of the thoracic aorta. Enlargement of the pulmonary outflow tract and main pulmonary arteries suggests pulmonary arterial hypertension. Right Port-A-Cath tip is positioned at the SVC/RA junction. Mediastinum/Nodes: Interval decrease in mediastinal lymphadenopathy. Index AP window lymph node measuring 11 mm today was 13 mm previously (remeasured). 5 mm subcarinal node on 25/2 today was 17 mm previously (remeasured). 5 mm precarinal node on 20/2 was 9 mm previously (remeasured). The esophagus has normal imaging features. No right hilar lymphadenopathy. Abnormal soft tissue attenuation in the left hilum is substantially decreased in the interval. There is no axillary lymphadenopathy. Lungs/Pleura: Centrilobular and paraseptal emphysema evident. Substantial interval reduction in size of the dominant left parahilar mass seen previously within area of ill-defined and non confluent irregular opacity in the retro hilar left lower lobe measuring 3.3 x 2.6 cm today in the region of the pulmonary mass measured previously at 7.2 x 5.9 cm. Diffuse chronic interstitial changes noted. Calcified granuloma identified left lower lobe. No new suspicious pulmonary nodule or mass. Musculoskeletal: Subtle area of sclerosis in the posterior right eighth rib (72/6) is stable in the interval otherwise, no new suspicious lytic or sclerotic osseous abnormality. Bones are diffusely demineralized. CT ABDOMEN PELVIS FINDINGS Hepatobiliary: Interval decrease in liver metastases. 1.5 cm right hepatic lesion on 54/2 today was 3.5 cm (remeasured) previously. 0.8 cm segment IV lesion on 53/2 was 2.4 cm  (remeasured) previously. 0.7 cm lesion in segment III (59/2) has decreased from 1.5 cm (remeasured) previously. No new liver lesions on today's study. Nodular liver contour (56/2) raises the question of cirrhosis. Gallbladder surgically absent. No intrahepatic or extrahepatic biliary dilation. Pancreas: No focal mass lesion. No dilatation of the main duct. No intraparenchymal cyst. No peripancreatic edema. Spleen: No splenomegaly. No focal mass lesion. Adrenals/Urinary Tract: No adrenal nodule or mass. Kidneys unremarkable. No evidence for hydroureter. The urinary bladder appears normal for the degree of distention. Stomach/Bowel: Stomach is moderately distended with food and contrast material. Duodenum is normally positioned as is the ligament of Treitz. No small bowel wall thickening. No small bowel dilatation. The terminal ileum is normal. The appendix is not well visualized, but there is no edema or inflammation in the region  of the cecum. No gross colonic mass. No colonic wall thickening. Diverticuli are seen scattered along the entire length of the colon without CT findings of diverticulitis. Vascular/Lymphatic: There is advanced atherosclerotic calcification of the abdominal aorta without aneurysm. 12 mm portal caval node measured previously is 8 mm short axis today on 56/2. No other abdominal lymphadenopathy on today's study. No pelvic sidewall lymphadenopathy. Reproductive: The uterus is surgically absent. There is no adnexal mass. Other: No intraperitoneal free fluid. Musculoskeletal: Sequelae of prior ventral mesh placement. Vague sclerosis seen previously in the S1 vertebral body has become more prominent in the interval with a relatively well-defined 2.0 cm sclerotic lesion visible on image 78/2 today. 1.3 cm sclerotic lesion noted left anterior iliac crest with another posterior left iliac lesion measuring 1.6 cm adjacent to the SI joint on 74/2. These regions correspond to areas of hypermetabolism on  the prior PET-CT. IMPRESSION: 1. Dramatic interval reduction in size of the dominant left parahilar lung mass with near complete interval resolution of mediastinal lymphadenopathy. There is a persistent borderline to minimally enlarged AP window lymph node on the current study. 2. Interval decrease in size of liver metastases. No new or progressive liver lesions on today's study. 3. No abdominal lymphadenopathy on today's study. 4. Areas of bony hypermetabolism seen on the previous PET-CT have become more densely sclerotic in the interval. Given response to therapy of soft tissue disease, this change probably represents healing of skeletal metastases. 5. Nodular liver contour raises the question of cirrhosis. 6. Enlargement of the pulmonary outflow tract and main pulmonary arteries suggests pulmonary arterial hypertension. 7. Aortic Atherosclerosis (ICD10-I70.0) and Emphysema (ICD10-J43.9). Electronically Signed   By: Misty Stanley M.D.   On: 10/28/2020 11:52   CT Abdomen Pelvis W Contrast  Result Date: 10/28/2020 CLINICAL DATA:  Small-cell lung cancer.  Staging. EXAM: CT CHEST, ABDOMEN, AND PELVIS WITH CONTRAST TECHNIQUE: Multidetector CT imaging of the chest, abdomen and pelvis was performed following the standard protocol during bolus administration of intravenous contrast. CONTRAST:  34m OMNIPAQUE IOHEXOL 350 MG/ML SOLN COMPARISON:  PET-CT 09/05/2020 FINDINGS: CT CHEST FINDINGS Cardiovascular: The heart size is normal. No substantial pericardial effusion. Moderate coronary artery calcification is evident. Moderate atherosclerotic calcification is noted in the wall of the thoracic aorta. Enlargement of the pulmonary outflow tract and main pulmonary arteries suggests pulmonary arterial hypertension. Right Port-A-Cath tip is positioned at the SVC/RA junction. Mediastinum/Nodes: Interval decrease in mediastinal lymphadenopathy. Index AP window lymph node measuring 11 mm today was 13 mm previously (remeasured).  5 mm subcarinal node on 25/2 today was 17 mm previously (remeasured). 5 mm precarinal node on 20/2 was 9 mm previously (remeasured). The esophagus has normal imaging features. No right hilar lymphadenopathy. Abnormal soft tissue attenuation in the left hilum is substantially decreased in the interval. There is no axillary lymphadenopathy. Lungs/Pleura: Centrilobular and paraseptal emphysema evident. Substantial interval reduction in size of the dominant left parahilar mass seen previously within area of ill-defined and non confluent irregular opacity in the retro hilar left lower lobe measuring 3.3 x 2.6 cm today in the region of the pulmonary mass measured previously at 7.2 x 5.9 cm. Diffuse chronic interstitial changes noted. Calcified granuloma identified left lower lobe. No new suspicious pulmonary nodule or mass. Musculoskeletal: Subtle area of sclerosis in the posterior right eighth rib (72/6) is stable in the interval otherwise, no new suspicious lytic or sclerotic osseous abnormality. Bones are diffusely demineralized. CT ABDOMEN PELVIS FINDINGS Hepatobiliary: Interval decrease in liver metastases. 1.5 cm right  hepatic lesion on 54/2 today was 3.5 cm (remeasured) previously. 0.8 cm segment IV lesion on 53/2 was 2.4 cm (remeasured) previously. 0.7 cm lesion in segment III (59/2) has decreased from 1.5 cm (remeasured) previously. No new liver lesions on today's study. Nodular liver contour (56/2) raises the question of cirrhosis. Gallbladder surgically absent. No intrahepatic or extrahepatic biliary dilation. Pancreas: No focal mass lesion. No dilatation of the main duct. No intraparenchymal cyst. No peripancreatic edema. Spleen: No splenomegaly. No focal mass lesion. Adrenals/Urinary Tract: No adrenal nodule or mass. Kidneys unremarkable. No evidence for hydroureter. The urinary bladder appears normal for the degree of distention. Stomach/Bowel: Stomach is moderately distended with food and contrast material.  Duodenum is normally positioned as is the ligament of Treitz. No small bowel wall thickening. No small bowel dilatation. The terminal ileum is normal. The appendix is not well visualized, but there is no edema or inflammation in the region of the cecum. No gross colonic mass. No colonic wall thickening. Diverticuli are seen scattered along the entire length of the colon without CT findings of diverticulitis. Vascular/Lymphatic: There is advanced atherosclerotic calcification of the abdominal aorta without aneurysm. 12 mm portal caval node measured previously is 8 mm short axis today on 56/2. No other abdominal lymphadenopathy on today's study. No pelvic sidewall lymphadenopathy. Reproductive: The uterus is surgically absent. There is no adnexal mass. Other: No intraperitoneal free fluid. Musculoskeletal: Sequelae of prior ventral mesh placement. Vague sclerosis seen previously in the S1 vertebral body has become more prominent in the interval with a relatively well-defined 2.0 cm sclerotic lesion visible on image 78/2 today. 1.3 cm sclerotic lesion noted left anterior iliac crest with another posterior left iliac lesion measuring 1.6 cm adjacent to the SI joint on 74/2. These regions correspond to areas of hypermetabolism on the prior PET-CT. IMPRESSION: 1. Dramatic interval reduction in size of the dominant left parahilar lung mass with near complete interval resolution of mediastinal lymphadenopathy. There is a persistent borderline to minimally enlarged AP window lymph node on the current study. 2. Interval decrease in size of liver metastases. No new or progressive liver lesions on today's study. 3. No abdominal lymphadenopathy on today's study. 4. Areas of bony hypermetabolism seen on the previous PET-CT have become more densely sclerotic in the interval. Given response to therapy of soft tissue disease, this change probably represents healing of skeletal metastases. 5. Nodular liver contour raises the question  of cirrhosis. 6. Enlargement of the pulmonary outflow tract and main pulmonary arteries suggests pulmonary arterial hypertension. 7. Aortic Atherosclerosis (ICD10-I70.0) and Emphysema (ICD10-J43.9). Electronically Signed   By: Misty Stanley M.D.   On: 10/28/2020 11:52    ASSESSMENT AND PLAN: This is a very pleasant 85 years old white female recently diagnosed with extensive stage (T3, N2, M1c) small cell lung cancer presented with large left lower lobe lung mass in addition to right hilar and mediastinal lymphadenopathy as well as satellite nodule in the left lower lobe and peripheral pleural lesion in addition to metastatic liver disease diagnosed in May 2022. The patient is currently undergoing systemic chemotherapy with carboplatin for AUC of 5 on day 1, etoposide 100 Mg/M2 on days 1, 2 and 3 with Cosela before the chemotherapy and Imfinzi 1500 Mg IV every 3 weeks on day 1.  Status post 2 cycles.   The patient has been tolerating this treatment well with no concerning adverse effect except for mild fatigue. She had repeat CT scan of the chest, abdomen pelvis performed recently.  I personally and independently reviewed the scan images and discussed the result and showed the images to the patient today. Her scan showed significant improvement of her disease. I recommended for her to proceed with her current treatment as planned and she is expected to start cycle #3 today. I will see her back for follow-up visit in 3 weeks for evaluation before starting cycle #4. The patient was advised to call immediately if she has any other concerning symptoms in the interval. The patient voices understanding of current disease status and treatment options and is in agreement with the current care plan.  All questions were answered. The patient knows to call the clinic with any problems, questions or concerns. We can certainly see the patient much sooner if necessary.  The total time spent in the appointment was 34  minutes.  Disclaimer: This note was dictated with voice recognition software. Similar sounding words can inadvertently be transcribed and may not be corrected upon review.

## 2020-10-30 NOTE — Patient Instructions (Addendum)
[facility]  Discharge Instructions: Thank you for choosing [facility] to provide your oncology and hematology care.   If you have a lab appointment with the [facility], please go directly to the [facility] and check in at the registration area.   Wear comfortable clothing and clothing appropriate for easy access to any Portacath or PICC line.   We strive to give you quality time with your provider. You may need to reschedule your appointment if you arrive late (15 or more minutes).  Arriving late affects you and other patients whose appointments are after yours.  Also, if you miss three or more appointments without notifying the office, you may be dismissed from the clinic at the provider's discretion.      For prescription refill requests, have your pharmacy contact our office and allow 72 hours for refills to be completed.    Today you received the following chemotherapy and/or immunotherapy agents: Cosela, Carboplatin, Etoposide      To help prevent nausea and vomiting after your treatment, we encourage you to take your nausea medication as directed.  BELOW ARE SYMPTOMS THAT SHOULD BE REPORTED IMMEDIATELY: *FEVER GREATER THAN 100.4 F (38 C) OR HIGHER *CHILLS OR SWEATING *NAUSEA AND VOMITING THAT IS NOT CONTROLLED WITH YOUR NAUSEA MEDICATION *UNUSUAL SHORTNESS OF BREATH *UNUSUAL BRUISING OR BLEEDING *URINARY PROBLEMS (pain or burning when urinating, or frequent urination) *BOWEL PROBLEMS (unusual diarrhea, constipation, pain near the anus) TENDERNESS IN MOUTH AND THROAT WITH OR WITHOUT PRESENCE OF ULCERS (sore throat, sores in mouth, or a toothache) UNUSUAL RASH, SWELLING OR PAIN  UNUSUAL VAGINAL DISCHARGE OR ITCHING   Items with * indicate a potential emergency and should be followed up as soon as possible or go to the Emergency Department if any problems should occur.  Please show the CHEMOTHERAPY ALERT CARD or IMMUNOTHERAPY ALERT  CARD at check-in to the Emergency Department and triage nurse.  Should you have questions after your visit or need to cancel or reschedule your appointment, please contact [facility]  Dept: [phone]  and follow the prompts.  Office hours are 8:00 a.m. to 4:30 p.m. Monday - Friday. Please note that voicemails left after 4:00 p.m. may not be returned until the following business day.  We are closed weekends and major holidays. You have access to a nurse at all times for urgent questions. Please call the main number to the clinic Dept: [phone] and follow the prompts.   For any non-urgent questions, you may also contact your provider using MyChart. We now offer e-Visits for anyone 86 and older to request care online for non-urgent symptoms. For details visit [email or web address].   Also download the MyChart app! Go to the app store, search "MyChart", open the app, select [facility], and log in with your MyChart username and password.  Due to Covid, a mask is required upon entering the hospital/clinic. If you do not have a mask, one will be given to you upon arrival. For doctor visits, patients may have 1 support person aged 69 or older with them. For treatment visits, patients cannot have anyone with them due to current Covid guidelines and our immunocompromised population.

## 2020-10-30 NOTE — Progress Notes (Signed)
10/30/2020 - Per verbal from Dr. Nicanor Alcon, pt is okay to tx today with ANC 1.2. RN Morey Hummingbird has been advised of this.

## 2020-10-30 NOTE — Telephone Encounter (Signed)
Scheduled appointment per 07/25 staff message. Patient is aware.

## 2020-10-31 ENCOUNTER — Inpatient Hospital Stay: Payer: Medicare HMO

## 2020-10-31 VITALS — BP 104/56 | HR 52 | Temp 97.9°F | Resp 18

## 2020-10-31 DIAGNOSIS — C3432 Malignant neoplasm of lower lobe, left bronchus or lung: Secondary | ICD-10-CM | POA: Diagnosis not present

## 2020-10-31 DIAGNOSIS — Z7951 Long term (current) use of inhaled steroids: Secondary | ICD-10-CM | POA: Diagnosis not present

## 2020-10-31 DIAGNOSIS — C787 Secondary malignant neoplasm of liver and intrahepatic bile duct: Secondary | ICD-10-CM | POA: Diagnosis not present

## 2020-10-31 DIAGNOSIS — Z5111 Encounter for antineoplastic chemotherapy: Secondary | ICD-10-CM | POA: Diagnosis not present

## 2020-10-31 DIAGNOSIS — Z79899 Other long term (current) drug therapy: Secondary | ICD-10-CM | POA: Diagnosis not present

## 2020-10-31 MED ORDER — SODIUM CHLORIDE 0.9 % IV SOLN
100.0000 mg/m2 | Freq: Once | INTRAVENOUS | Status: AC
  Administered 2020-10-31: 170 mg via INTRAVENOUS
  Filled 2020-10-31: qty 8.5

## 2020-10-31 MED ORDER — TRILACICLIB DIHYDROCHLORIDE INJECTION 300 MG
240.0000 mg/m2 | Freq: Once | INTRAVENOUS | Status: AC
  Administered 2020-10-31: 405 mg via INTRAVENOUS
  Filled 2020-10-31: qty 27

## 2020-10-31 MED ORDER — SODIUM CHLORIDE 0.9 % IV SOLN
Freq: Once | INTRAVENOUS | Status: AC
  Filled 2020-10-31: qty 250

## 2020-10-31 MED ORDER — SODIUM CHLORIDE 0.9 % IV SOLN
10.0000 mg | Freq: Once | INTRAVENOUS | Status: AC
  Administered 2020-10-31: 10 mg via INTRAVENOUS
  Filled 2020-10-31: qty 10

## 2020-10-31 MED ORDER — HEPARIN SOD (PORK) LOCK FLUSH 100 UNIT/ML IV SOLN
500.0000 [IU] | Freq: Once | INTRAVENOUS | Status: AC | PRN
Start: 2020-10-31 — End: 2020-10-31
  Administered 2020-10-31: 500 [IU]
  Filled 2020-10-31: qty 5

## 2020-10-31 MED ORDER — SODIUM CHLORIDE 0.9% FLUSH
10.0000 mL | INTRAVENOUS | Status: DC | PRN
  Administered 2020-10-31: 10 mL
  Filled 2020-10-31: qty 10

## 2020-10-31 NOTE — Patient Instructions (Signed)
[facility]  Discharge Instructions: Thank you for choosing [facility] to provide your oncology and hematology care.   If you have a lab appointment with the [facility], please go directly to the [facility] and check in at the registration area.   Wear comfortable clothing and clothing appropriate for easy access to any Portacath or PICC line.   We strive to give you quality time with your provider. You may need to reschedule your appointment if you arrive late (15 or more minutes).  Arriving late affects you and other patients whose appointments are after yours.  Also, if you miss three or more appointments without notifying the office, you may be dismissed from the clinic at the provider's discretion.      For prescription refill requests, have your pharmacy contact our office and allow 72 hours for refills to be completed.    Today you received the following chemotherapy and/or immunotherapy agents: Etoposide     To help prevent nausea and vomiting after your treatment, we encourage you to take your nausea medication as directed.  BELOW ARE SYMPTOMS THAT SHOULD BE REPORTED IMMEDIATELY: *FEVER GREATER THAN 100.4 F (38 C) OR HIGHER *CHILLS OR SWEATING *NAUSEA AND VOMITING THAT IS NOT CONTROLLED WITH YOUR NAUSEA MEDICATION *UNUSUAL SHORTNESS OF BREATH *UNUSUAL BRUISING OR BLEEDING *URINARY PROBLEMS (pain or burning when urinating, or frequent urination) *BOWEL PROBLEMS (unusual diarrhea, constipation, pain near the anus) TENDERNESS IN MOUTH AND THROAT WITH OR WITHOUT PRESENCE OF ULCERS (sore throat, sores in mouth, or a toothache) UNUSUAL RASH, SWELLING OR PAIN  UNUSUAL VAGINAL DISCHARGE OR ITCHING   Items with * indicate a potential emergency and should be followed up as soon as possible or go to the Emergency Department if any problems should occur.  Please show the CHEMOTHERAPY ALERT CARD or IMMUNOTHERAPY ALERT CARD at check-in to  the Emergency Department and triage nurse.  Should you have questions after your visit or need to cancel or reschedule your appointment, please contact [facility]  Dept: [phone]  and follow the prompts.  Office hours are 8:00 a.m. to 4:30 p.m. Monday - Friday. Please note that voicemails left after 4:00 p.m. may not be returned until the following business day.  We are closed weekends and major holidays. You have access to a nurse at all times for urgent questions. Please call the main number to the clinic Dept: [phone] and follow the prompts.   For any non-urgent questions, you may also contact your provider using MyChart. We now offer e-Visits for anyone 73 and older to request care online for non-urgent symptoms. For details visit [email or web address].   Also download the MyChart app! Go to the app store, search "MyChart", open the app, select [facility], and log in with your MyChart username and password.  Due to Covid, a mask is required upon entering the hospital/clinic. If you do not have a mask, one will be given to you upon arrival. For doctor visits, patients may have 1 support person aged 49 or older with them. For treatment visits, patients cannot have anyone with them due to current Covid guidelines and our immunocompromised population.

## 2020-11-01 ENCOUNTER — Inpatient Hospital Stay: Payer: Medicare HMO

## 2020-11-01 ENCOUNTER — Ambulatory Visit: Payer: Medicare HMO

## 2020-11-01 ENCOUNTER — Other Ambulatory Visit: Payer: Self-pay

## 2020-11-01 VITALS — BP 101/52 | HR 58 | Temp 97.9°F | Resp 18

## 2020-11-01 DIAGNOSIS — C3432 Malignant neoplasm of lower lobe, left bronchus or lung: Secondary | ICD-10-CM

## 2020-11-01 DIAGNOSIS — C787 Secondary malignant neoplasm of liver and intrahepatic bile duct: Secondary | ICD-10-CM | POA: Diagnosis not present

## 2020-11-01 DIAGNOSIS — Z79899 Other long term (current) drug therapy: Secondary | ICD-10-CM | POA: Diagnosis not present

## 2020-11-01 DIAGNOSIS — Z7951 Long term (current) use of inhaled steroids: Secondary | ICD-10-CM | POA: Diagnosis not present

## 2020-11-01 DIAGNOSIS — Z5111 Encounter for antineoplastic chemotherapy: Secondary | ICD-10-CM | POA: Diagnosis not present

## 2020-11-01 MED ORDER — SODIUM CHLORIDE 0.9 % IV SOLN
100.0000 mg/m2 | Freq: Once | INTRAVENOUS | Status: AC
  Administered 2020-11-01: 170 mg via INTRAVENOUS
  Filled 2020-11-01: qty 8.5

## 2020-11-01 MED ORDER — SODIUM CHLORIDE 0.9% FLUSH
10.0000 mL | INTRAVENOUS | Status: DC | PRN
  Administered 2020-11-01: 10 mL
  Filled 2020-11-01: qty 10

## 2020-11-01 MED ORDER — TRILACICLIB DIHYDROCHLORIDE INJECTION 300 MG
240.0000 mg/m2 | Freq: Once | INTRAVENOUS | Status: AC
  Administered 2020-11-01: 405 mg via INTRAVENOUS
  Filled 2020-11-01: qty 27

## 2020-11-01 MED ORDER — HEPARIN SOD (PORK) LOCK FLUSH 100 UNIT/ML IV SOLN
500.0000 [IU] | Freq: Once | INTRAVENOUS | Status: AC | PRN
  Administered 2020-11-01: 500 [IU]
  Filled 2020-11-01: qty 5

## 2020-11-01 MED ORDER — SODIUM CHLORIDE 0.9 % IV SOLN
10.0000 mg | Freq: Once | INTRAVENOUS | Status: AC
  Administered 2020-11-01: 10 mg via INTRAVENOUS
  Filled 2020-11-01: qty 10

## 2020-11-01 MED ORDER — SODIUM CHLORIDE 0.9 % IV SOLN
Freq: Once | INTRAVENOUS | Status: AC
  Filled 2020-11-01: qty 250

## 2020-11-01 NOTE — Patient Instructions (Signed)
[facility]  Discharge Instructions: Thank you for choosing [facility] to provide your oncology and hematology care.   If you have a lab appointment with the [facility], please go directly to the [facility] and check in at the registration area.   Wear comfortable clothing and clothing appropriate for easy access to any Portacath or PICC line.   We strive to give you quality time with your provider. You may need to reschedule your appointment if you arrive late (15 or more minutes).  Arriving late affects you and other patients whose appointments are after yours.  Also, if you miss three or more appointments without notifying the office, you may be dismissed from the clinic at the provider's discretion.      For prescription refill requests, have your pharmacy contact our office and allow 72 hours for refills to be completed.    Today you received the following chemotherapy and/or immunotherapy agents: Etoposide     To help prevent nausea and vomiting after your treatment, we encourage you to take your nausea medication as directed.  BELOW ARE SYMPTOMS THAT SHOULD BE REPORTED IMMEDIATELY: *FEVER GREATER THAN 100.4 F (38 C) OR HIGHER *CHILLS OR SWEATING *NAUSEA AND VOMITING THAT IS NOT CONTROLLED WITH YOUR NAUSEA MEDICATION *UNUSUAL SHORTNESS OF BREATH *UNUSUAL BRUISING OR BLEEDING *URINARY PROBLEMS (pain or burning when urinating, or frequent urination) *BOWEL PROBLEMS (unusual diarrhea, constipation, pain near the anus) TENDERNESS IN MOUTH AND THROAT WITH OR WITHOUT PRESENCE OF ULCERS (sore throat, sores in mouth, or a toothache) UNUSUAL RASH, SWELLING OR PAIN  UNUSUAL VAGINAL DISCHARGE OR ITCHING   Items with * indicate a potential emergency and should be followed up as soon as possible or go to the Emergency Department if any problems should occur.  Please show the CHEMOTHERAPY ALERT CARD or IMMUNOTHERAPY ALERT CARD at check-in to  the Emergency Department and triage nurse.  Should you have questions after your visit or need to cancel or reschedule your appointment, please contact [facility]  Dept: [phone]  and follow the prompts.  Office hours are 8:00 a.m. to 4:30 p.m. Monday - Friday. Please note that voicemails left after 4:00 p.m. may not be returned until the following business day.  We are closed weekends and major holidays. You have access to a nurse at all times for urgent questions. Please call the main number to the clinic Dept: [phone] and follow the prompts.   For any non-urgent questions, you may also contact your provider using MyChart. We now offer e-Visits for anyone 32 and older to request care online for non-urgent symptoms. For details visit [email or web address].   Also download the MyChart app! Go to the app store, search "MyChart", open the app, select [facility], and log in with your MyChart username and password.  Due to Covid, a mask is required upon entering the hospital/clinic. If you do not have a mask, one will be given to you upon arrival. For doctor visits, patients may have 1 support person aged 25 or older with them. For treatment visits, patients cannot have anyone with them due to current Covid guidelines and our immunocompromised population.

## 2020-11-06 ENCOUNTER — Other Ambulatory Visit: Payer: Medicare HMO

## 2020-11-11 DIAGNOSIS — J9621 Acute and chronic respiratory failure with hypoxia: Secondary | ICD-10-CM | POA: Diagnosis not present

## 2020-11-11 DIAGNOSIS — J8410 Pulmonary fibrosis, unspecified: Secondary | ICD-10-CM | POA: Diagnosis not present

## 2020-11-11 DIAGNOSIS — J449 Chronic obstructive pulmonary disease, unspecified: Secondary | ICD-10-CM | POA: Diagnosis not present

## 2020-11-11 DIAGNOSIS — G4733 Obstructive sleep apnea (adult) (pediatric): Secondary | ICD-10-CM | POA: Diagnosis not present

## 2020-11-13 ENCOUNTER — Inpatient Hospital Stay: Payer: Medicare HMO | Attending: Internal Medicine

## 2020-11-13 ENCOUNTER — Other Ambulatory Visit: Payer: Self-pay

## 2020-11-13 DIAGNOSIS — C7951 Secondary malignant neoplasm of bone: Secondary | ICD-10-CM | POA: Insufficient documentation

## 2020-11-13 DIAGNOSIS — C3432 Malignant neoplasm of lower lobe, left bronchus or lung: Secondary | ICD-10-CM | POA: Diagnosis not present

## 2020-11-13 DIAGNOSIS — C778 Secondary and unspecified malignant neoplasm of lymph nodes of multiple regions: Secondary | ICD-10-CM | POA: Insufficient documentation

## 2020-11-13 DIAGNOSIS — Z95828 Presence of other vascular implants and grafts: Secondary | ICD-10-CM

## 2020-11-13 DIAGNOSIS — Z5111 Encounter for antineoplastic chemotherapy: Secondary | ICD-10-CM | POA: Diagnosis not present

## 2020-11-13 DIAGNOSIS — C787 Secondary malignant neoplasm of liver and intrahepatic bile duct: Secondary | ICD-10-CM | POA: Insufficient documentation

## 2020-11-13 DIAGNOSIS — Z79899 Other long term (current) drug therapy: Secondary | ICD-10-CM | POA: Diagnosis not present

## 2020-11-13 LAB — CBC WITH DIFFERENTIAL (CANCER CENTER ONLY)
Abs Immature Granulocytes: 0.01 10*3/uL (ref 0.00–0.07)
Basophils Absolute: 0 10*3/uL (ref 0.0–0.1)
Basophils Relative: 1 %
Eosinophils Absolute: 0 10*3/uL (ref 0.0–0.5)
Eosinophils Relative: 2 %
HCT: 26.1 % — ABNORMAL LOW (ref 36.0–46.0)
Hemoglobin: 8.4 g/dL — ABNORMAL LOW (ref 12.0–15.0)
Immature Granulocytes: 1 %
Lymphocytes Relative: 36 %
Lymphs Abs: 0.7 10*3/uL (ref 0.7–4.0)
MCH: 30.9 pg (ref 26.0–34.0)
MCHC: 32.2 g/dL (ref 30.0–36.0)
MCV: 96 fL (ref 80.0–100.0)
Monocytes Absolute: 0.5 10*3/uL (ref 0.1–1.0)
Monocytes Relative: 23 %
Neutro Abs: 0.7 10*3/uL — ABNORMAL LOW (ref 1.7–7.7)
Neutrophils Relative %: 37 %
Platelet Count: 90 10*3/uL — ABNORMAL LOW (ref 150–400)
RBC: 2.72 MIL/uL — ABNORMAL LOW (ref 3.87–5.11)
RDW: 17 % — ABNORMAL HIGH (ref 11.5–15.5)
WBC Count: 1.9 10*3/uL — ABNORMAL LOW (ref 4.0–10.5)
nRBC: 0 % (ref 0.0–0.2)

## 2020-11-13 LAB — CMP (CANCER CENTER ONLY)
ALT: 15 U/L (ref 0–44)
AST: 16 U/L (ref 15–41)
Albumin: 3.6 g/dL (ref 3.5–5.0)
Alkaline Phosphatase: 38 U/L (ref 38–126)
Anion gap: 7 (ref 5–15)
BUN: 8 mg/dL (ref 8–23)
CO2: 29 mmol/L (ref 22–32)
Calcium: 9.3 mg/dL (ref 8.9–10.3)
Chloride: 103 mmol/L (ref 98–111)
Creatinine: 0.68 mg/dL (ref 0.44–1.00)
GFR, Estimated: >60 mL/min (ref >60)
Glucose, Bld: 81 mg/dL (ref 70–99)
Potassium: 4 mmol/L (ref 3.5–5.1)
Sodium: 139 mmol/L (ref 135–145)
Total Bilirubin: 0.4 mg/dL (ref 0.3–1.2)
Total Protein: 5.9 g/dL — ABNORMAL LOW (ref 6.5–8.1)

## 2020-11-13 MED ORDER — SODIUM CHLORIDE 0.9% FLUSH
10.0000 mL | Freq: Once | INTRAVENOUS | Status: AC
  Administered 2020-11-13: 10 mL
  Filled 2020-11-13: qty 10

## 2020-11-13 MED ORDER — HEPARIN SOD (PORK) LOCK FLUSH 100 UNIT/ML IV SOLN
500.0000 [IU] | Freq: Once | INTRAVENOUS | Status: AC
  Administered 2020-11-13: 500 [IU]
  Filled 2020-11-13: qty 5

## 2020-11-15 ENCOUNTER — Ambulatory Visit (INDEPENDENT_AMBULATORY_CARE_PROVIDER_SITE_OTHER): Payer: Medicare HMO

## 2020-11-15 DIAGNOSIS — Z0000 Encounter for general adult medical examination without abnormal findings: Secondary | ICD-10-CM | POA: Diagnosis not present

## 2020-11-15 NOTE — Progress Notes (Signed)
I connected with Dawn Gomez today by telephone and verified that I am speaking with the correct person using two identifiers. Location patient: home Location provider: work Persons participating in the virtual visit: Cherylann Hobday and [place], LPN.   I discussed the limitations, risks, security and privacy concerns of performing an evaluation and management service by telephone and the availability of in person appointments. I also discussed with the patient that there may be a patient responsible charge related to this service. The patient expressed understanding and verbally consented to this telephonic visit.    Interactive audio and video telecommunications were attempted between this provider and patient, however failed, due to patient having technical difficulties OR patient did not have access to video capability.  We continued and completed visit with audio only.  Some vital signs may be absent or patient reported.   Time Spent with patient on telephone encounter: 30 minutes  Subjective:   Dawn Gomez is a 85 y.o. female who presents for Medicare Annual (Subsequent) preventive examination.  Review of Systems     Cardiac Risk Factors include: advanced age (>69mn, >>19women);dyslipidemia;family history of premature cardiovascular disease;hypertension     Objective:    Today's Vitals   11/15/20 1233  PainSc: 0-No pain   There is no height or weight on file to calculate BMI.  Advanced Directives 11/15/2020 10/30/2020 10/11/2020 10/10/2020 09/18/2020 08/31/2020 08/29/2020  Does Patient Have a Medical Advance Directive? Yes Yes Yes Yes No Yes No  Type of AArts administratorPower of ABronxLiving will - HLaurensLiving will -  Does patient want to make changes to medical advance directive? No - Patient declined - No - Patient declined No - Patient declined  No - Patient declined No - Patient declined -  Copy of HM[place]ensin Chart? No - copy requested - - No - copy requested - No - copy requested -  Would patient like information on creating a medical advance directive? - - - No - Patient declined - No - Patient declined Yes (MAU/Ambulatory/Procedural Areas - Information given)    Current Medications (verified) Outpatient Encounter Medications as of 11/15/2020  Medication Sig   acetaminophen (TYLENOL) 500 MG tablet Take 500 mg by mouth every 6 (six) hours as needed.   Ascorbic Acid (VITAMIN C PO) Take by mouth.   Azelastine-Fluticasone 137-50 MCG/ACT SUSP Place 1 spray into the nose every 12 (twelve) hours.   Blood Glucose Monitoring Suppl (TRUE METRIX METER) w/Device KIT USE AS DIRECTED   CALCIUM-MAGNESIUM-ZINC PO Take 1 tablet by mouth daily.   cholecalciferol (VITAMIN D) 1000 units tablet Take 5,000 Units by mouth daily.   diltiazem (CARDIZEM CD) 180 MG 24 hr capsule Take 1 capsule (180 mg total) by mouth daily.   escitalopram (LEXAPRO) 20 MG tablet Take 1 tablet (20 mg total) by mouth at bedtime.   fenofibrate 160 MG tablet TAKE 1 TABLET EVERY DAY   fluocinonide (LIDEX) 0.05 % external solution APP EXT AA OF SCALP NIGHTLY   furosemide (LASIX) 80 MG tablet Take 1 tablet (80 mg total) by mouth daily.   ipratropium-albuterol (DUONEB) 0.5-2.5 (3) MG/3ML SOLN Take 3 mLs by nebulization every 4 (four) hours as needed.   levothyroxine (SYNTHROID) 100 MCG tablet TAKE 1 TABLET EVERY DAY BEFORE BREAKFAST   lisinopril (ZESTRIL) 5 MG tablet Take 1 tablet (5 mg total) by mouth daily.   potassium chloride SA (  KLOR-CON) 20 MEQ tablet TAKE 1 TABLET EVERY DAY WITH FOOD   rosuvastatin (CRESTOR) 20 MG tablet Take 1 tablet (20 mg total) by mouth daily.   tiZANidine (ZANAFLEX) 2 MG tablet TAKE 1 TABLET THREE TIMES DAILY   traMADol (ULTRAM) 50 MG tablet Take 1 tablet (50 mg total) by mouth 4 (four) times daily.   traZODone (DESYREL) 50 MG  tablet Take 1 tablet (50 mg total) by mouth at bedtime as needed for sleep.   vitamin B-12 (CYANOCOBALAMIN) 1000 MCG tablet Take 500 mcg by mouth daily.   vitamin C (ASCORBIC ACID) 500 MG tablet Take 500 mg by mouth daily.   Vitamin D, Ergocalciferol, (DRISDOL) 50000 units CAPS capsule Take 1 capsule (50,000 Units total) by mouth every 7 (seven) days.   vitamin E 400 UNIT capsule Take 200 Units by mouth daily.   guaiFENesin (MUCINEX) 600 MG 12 hr tablet Take 2 tablets (1,200 mg total) by mouth 2 (two) times daily as needed. (Patient not taking: Reported on 10/10/2020)   HYDROcodone-acetaminophen (NORCO/VICODIN) 5-325 MG tablet Take 1 tablet by mouth every 6 (six) hours as needed. (Patient not taking: Reported on 10/30/2020)   lidocaine-prilocaine (EMLA) cream 30 Squeeze  1/2 tsp on a cotton ball and apply to skin over port a cath site. Do not rub it in. Cover with plastic wrap. Apply 1-2 hours prior to your treatment. (Patient not taking: Reported on 10/10/2020)   prochlorperazine (COMPAZINE) 10 MG tablet Take 1 tablet (10 mg total) by mouth every 6 (six) hours as needed for nausea or vomiting. (Patient not taking: Reported on 10/10/2020)   No facility-administered encounter medications on file as of 11/15/2020.    Allergies (verified) Lipitor [atorvastatin], Codeine, Hydromorphone hcl, Levaquin [levofloxacin in d5w], Morphine, Oxycodone-acetaminophen, Propoxyphene n-acetaminophen, and Simvastatin   History: Past Medical History:  Diagnosis Date   Abnormal heart rhythm    Aortic aneurysm (HCC)    Aortic atherosclerosis ([facility]) 03/03/2019   Dyspnea    Hypothyroid    OSA (obstructive sleep apnea)    on cpap   Psoriasis 02/05/2017   Pulmonary fibrosis (HCC)    SVT (supraventricular tachycardia) (HCC)    Thyroid disease    hypo   Past Surgical History:  Procedure Laterality Date   ABDOMINAL HYSTERECTOMY     APPENDECTOMY     CHOLECYSTECTOMY     CRANIOTOMY     for aneurysms   CRANIOTOMY   1980 x 2   aneurysmal clipping   HERNIA REPAIR     IR IMAGING GUIDED PORT INSERTION  09/15/2020   TOTAL ABDOMINAL HYSTERECTOMY     TUBAL LIGATION     Family History  Problem Relation Age of Onset   Emphysema Father    Heart disease Father    Clotting disorder Father    Rheum arthritis Father    Lung cancer Father    Prostate cancer Father    Bone cancer Father    Emphysema Sister    Emphysema Mother    Heart disease Mother    Rheum arthritis Mother    Lung cancer Mother    Allergies Sister    Clotting disorder Sister    Rheum arthritis Sister    Breast cancer Sister    Cancer Sister        ureter   Social History   Socioeconomic History   Marital status: Single    Spouse name: Not on file   Number of children: 1   Years of education: 16   Highest  education level: Not on file  Occupational History   Occupation: retired Therapist, sports   Occupation: retired  Tobacco Use   Smoking status: Former    Packs/day: 2.00    Years: 50.00    Pack years: 100.00    Types: Cigarettes    Quit date: 2008    Years since quitting: 14.6   Smokeless tobacco: Never  Substance and Sexual Activity   Alcohol use: Yes    Comment: "drank back in her younger wilder days"   Drug use: No   Sexual activity: Not on file  Other Topics Concern   Not on file  Social History Narrative   ** Merged History Encounter **       Social Determinants of Health   Financial Resource Strain: Low Risk    Difficulty of Paying Living Expenses: Not hard at all  Food Insecurity: No Food Insecurity   Worried About Charity fundraiser in the Last Year: Never true   Arboriculturist in the Last Year: Never true  Transportation Needs: No Transportation Needs   Lack of Transportation (Medical): No   Lack of Transportation (Non-Medical): No  Physical Activity: Sufficiently Active   Days of Exercise per Week: 5 days   Minutes of Exercise per Session: 30 min  Stress: No Stress Concern Present   Feeling of Stress : Not  at all  Social Connections: Moderately Integrated   Frequency of Communication with Friends and Family: More than three times a week   Frequency of Social Gatherings with Friends and Family: More than three times a week   Attends Religious Services: More than 4 times per year   Active Member of [employer] or Organizations: Yes   Attends Music therapist: More than 4 times per year   Marital Status: Divorced    Tobacco Counseling Counseling given: Not Answered   Clinical Intake:  Pre-visit preparation completed: Yes  Pain : No/denies pain Pain Score: 0-No pain     Nutritional Risks: None Diabetes: No  How often do you need to have someone help you when you read instructions, pamphlets, or other written materials from your doctor or pharmacy?: 1 - Never What is the last grade level you completed in school?: 2 years diploma in Nursing  Diabetic? no  Interpreter Needed?: No  Information entered by :: Lisette Abu, LPN   Activities of Daily Living In your present state of health, do you have any difficulty performing the following activities: 11/15/2020 09/15/2020  Hearing? N Y  [place]? N N  Difficulty concentrating or making decisions? N N  Walking or climbing stairs? N -  Dressing or bathing? N -  Doing errands, shopping? N -  Preparing Food and eating ? N -  Using the Toilet? N -  In the past six months, have you accidently leaked urine? N -  Do you have problems with loss of bowel control? N -  Managing your Medications? N -  Managing your Finances? N -  Housekeeping or managing your Housekeeping? N -  Some recent data might be hidden    Patient Care Team: Biagio Borg, MD as PCP - General (Internal Medicine) Golden Circle, FNP (Family Medicine) Juanito Doom, MD as Consulting Physician (Pulmonary Disease) Richmond Campbell, MD as Consulting Physician (Gastroenterology) Valrie Hart, RN as Oncology Nurse Navigator  (Oncology) [facility] as Consulting Physician (Ophthalmology)  Indicate any recent Medical Services you may have received from  other than [facility] providers in the past year (date may be approximate).     Assessment:   This is a routine wellness examination for Dawn Gomez.  Hearing/Vision screen Hearing Screening - Comments:: Patient wears hearing aids.  Vision Screening - Comments:: Patient does not wear any corrective lenses.  Eye exam is done by [facility].  Dietary issues and exercise activities discussed: Current Exercise Habits: Home exercise routine, Type of exercise: walking, Time (Minutes): 30, Frequency (Times/Week): 5, Weekly Exercise (Minutes/Week): 150, Intensity: Mild, Exercise limited by: respiratory conditions(s);orthopedic condition(s)   Goals Addressed               This Visit's Progress     Patient Stated (pt-stated)        To continue losing weight by watching my diet and staying hydrated.      Depression Screen PHQ 2/9 Scores 11/15/2020 08/17/2020 10/06/2018 03/25/2018 09/26/2017 08/22/2016 08/14/2016  PHQ - 2 Score 0 0 1 1 0 0 0  PHQ- 9 Score - - - 3 - - -    Fall Risk Fall Risk  11/15/2020 12/17/2019 10/06/2018 03/25/2018 09/26/2017  Falls in the past year? 1 1 0 0 Yes  Number falls in past yr: 0 0 - - 1  Injury with Fall? 1 0 - - -  Risk Factor Category  - - - - -  Risk for fall due to : - History of fall(s) - Impaired balance/gait -  Follow up Falls evaluation completed Falls evaluation completed - Falls prevention discussed -    FALL RISK PREVENTION PERTAINING TO THE HOME:  Any stairs in or around the home? Yes  If so, are there any without handrails? No  Home free of loose throw rugs in walkways, pet beds, electrical cords, etc? Yes  Adequate lighting in your home to reduce risk of falls? Yes   ASSISTIVE DEVICES UTILIZED TO PREVENT FALLS:  Life alert? No  Use of a cane, walker or w/c? No  Grab bars in the bathroom? No  Shower  chair or bench in shower? Yes  Elevated toilet seat or a handicapped toilet? No   TIMED UP AND GO:  Was the test performed? No .  Length of time to ambulate 10 feet: n/a sec.   Gait steady and fast without use of assistive device  Cognitive Function: Normal cognitive status assessed by direct observation by this Nurse Health Advisor. No abnormalities found.   MMSE - Mini Mental State Exam 03/25/2018  Orientation to time 5  Orientation to Place 5  Registration 3  Attention/ Calculation 5  Recall 1  Language- name 2 objects 2  Language- repeat 1  Language- follow 3 step command 3  Language- read & follow direction 1  Write a sentence 1  Copy design 1  Total score 28        Immunizations Immunization History  Administered Date(s) Administered   Fluad Quad(high Dose 65+) 02/01/2019, 12/17/2019   Influenza Whole 02/06/2009, 11/06/2009, 01/29/2012   Influenza, High Dose Seasonal PF 02/05/2017, 02/09/2018   Influenza,inj,Quad PF,6+ Mos 02/08/2013, 01/28/2014   Influenza-Unspecified 03/09/2015, 12/08/2015   Pneumococcal Conjugate-13 10/06/2018   Pneumococcal Polysaccharide-23 02/06/2009, 09/26/2017   Pneumococcal-Unspecified 04/08/2014   Td 08/06/2012    TDAP status: Up to date  Flu Vaccine status: Up to date  Pneumococcal vaccine status: Up to date  Covid-19 vaccine status: Declined, Education has been provided regarding the importance of this vaccine but patient still declined. Advised may receive this vaccine at local pharmacy  or Health Dept.or vaccine clinic. Aware to provide a copy of the vaccination record if obtained from local pharmacy or Health Dept. Verbalized acceptance and understanding.  Qualifies for Shingles Vaccine? Yes   Zostavax completed No   Shingrix Completed?: No.    Education has been provided regarding the importance of this vaccine. Patient has been advised to call insurance company to determine out of pocket expense if they have not yet received  this vaccine. Advised may also receive vaccine at local pharmacy or Health Dept. Verbalized acceptance and understanding.  Screening Tests Health Maintenance  Topic Date Due   COVID-19 Vaccine (1) Never done   Zoster Vaccines- Shingrix (1 of 2) Never done   INFLUENZA VACCINE  11/06/2020   DEXA SCAN  12/16/2020 (Originally 04/01/2003)   TETANUS/TDAP  08/07/2022   PNA vac Low Risk Adult  Completed   HPV VACCINES  Aged Out    Health Maintenance  Health Maintenance Due  Topic Date Due   COVID-19 Vaccine (1) Never done   Zoster Vaccines- Shingrix (1 of 2) Never done   INFLUENZA VACCINE  11/06/2020    Colorectal cancer screening: No longer required.   Mammogram status: Completed 06/02/2019. Repeat every year  Bone density status: Postponed   Lung Cancer Screening: (Low Dose CT Chest recommended if Age 100-80 years, 30 pack-year currently smoking OR have quit w/in 15years.) does not qualify.   Lung Cancer Screening Referral: no  Additional Screening:  Hepatitis C Screening: does not qualify; Completed no  Vision Screening: Recommended annual ophthalmology exams for early detection of glaucoma and other disorders of the eye. Is the patient up to date with their annual eye exam?  Yes  Who is the provider or what is the name of the office in which the patient attends annual eye exams? [facility] If pt is not established with a provider, would they like to be referred to a provider to establish care? No .   Dental Screening: Recommended annual dental exams for proper oral hygiene  Community Resource Referral / Chronic Care Management: CRR required this visit?  No   CCM required this visit?  No      Plan:     I have personally reviewed and noted the following in the patient's chart:   Medical and social history Use of alcohol, tobacco or illicit drugs  Current medications and supplements including opioid prescriptions.  Functional ability and  status Nutritional status Physical activity Advanced directives List of other physicians Hospitalizations, surgeries, and ER visits in previous 12 months Vitals Screenings to include cognitive, depression, and falls Referrals and appointments  In addition, I have reviewed and discussed with patient certain preventive protocols, quality metrics, and best practice recommendations. A written personalized care plan for preventive services as well as general preventive health recommendations were provided to patient.     Sheral Flow, LPN   05/17/1976   Nurse Notes:  Patient is cogitatively intact. There were no vitals filed for this visit. There is no height or weight on file to calculate BMI.

## 2020-11-15 NOTE — Patient Instructions (Signed)
Dawn Gomez , Thank you for taking time to come for your Medicare Wellness Visit. I appreciate your ongoing commitment to your health goals. Please review the following plan we discussed and let me know if I can assist you in the future.   Screening recommendations/referrals: Colonoscopy: not a candidate due to age 85: last done 06/02/2019 Bone Density: never done; postponed Recommended yearly ophthalmology/optometry visit for glaucoma screening and checkup Recommended yearly dental visit for hygiene and checkup  Vaccinations: Influenza vaccine: 12/17/2019 Pneumococcal vaccine: 09/26/2017, 10/06/2018 Tdap vaccine: 08/06/2012; due every 10 years Shingles vaccine: never done   Covid-19: never done  Advanced directives: Please bring a copy of your health care power of attorney and living will to the office at your convenience.  Conditions/risks identified: Yes; Client understands the importance of follow-up with providers by attending scheduled visits and discussed goals to eat healthier, increase physical activity, exercise the brain, socialize more, get enough sleep and make time for laughter.  Next appointment: Please schedule your next Medicare Wellness Visit with your Nurse Health Advisor in 1 year by calling [phone].   Preventive Care 63 Years and Older, Female Preventive care refers to lifestyle choices and visits with your health care provider that can promote health and wellness. What does preventive care include? A yearly physical exam. This is also called an annual well check. Dental exams once or twice a year. Routine eye exams. Ask your health care provider how often you should have your eyes checked. Personal lifestyle choices, including: Daily care of your teeth and gums. Regular physical activity. Eating a healthy diet. Avoiding tobacco and drug use. Limiting alcohol use. Practicing safe sex. Taking low-dose aspirin every day. Taking vitamin and mineral  supplements as recommended by your health care provider. What happens during an annual well check? The services and screenings done by your health care provider during your annual well check will depend on your age, overall health, lifestyle risk factors, and family history of disease. Counseling  Your health care provider may ask you questions about your: Alcohol use. Tobacco use. Drug use. Emotional well-being. Home and relationship well-being. Sexual activity. Eating habits. History of falls. Memory and ability to understand (cognition). Work and work Statistician. Reproductive health. Screening  You may have the following tests or measurements: Height, weight, and BMI. Blood pressure. Lipid and cholesterol levels. These may be checked every 5 years, or more frequently if you are over 59 years old. Skin check. Lung cancer screening. You may have this screening every year starting at age 79 if you have a 30-pack-year history of smoking and currently smoke or have quit within the past 15 years. Fecal occult blood test (FOBT) of the stool. You may have this test every year starting at age 73. Flexible sigmoidoscopy or colonoscopy. You may have a sigmoidoscopy every 5 years or a colonoscopy every 10 years starting at age 24. Hepatitis C blood test. Hepatitis B blood test. Sexually transmitted disease (STD) testing. Diabetes screening. This is done by checking your blood sugar (glucose) after you have not eaten for a while (fasting). You may have this done every 1-3 years. Bone density scan. This is done to screen for osteoporosis. You may have this done starting at age 32. Mammogram. This may be done every 1-2 years. Talk to your health care provider about how often you should have regular mammograms. Talk with your health care provider about your test results, treatment options, and if necessary, the need for more tests. Vaccines  Your  health care provider may recommend certain  vaccines, such as: Influenza vaccine. This is recommended every year. Tetanus, diphtheria, and acellular pertussis (Tdap, Td) vaccine. You may need a Td booster every 10 years. Zoster vaccine. You may need this after age 48. Pneumococcal 13-valent conjugate (PCV13) vaccine. One dose is recommended after age 45. Pneumococcal polysaccharide (PPSV23) vaccine. One dose is recommended after age 51. Talk to your health care provider about which screenings and vaccines you need and how often you need them. This information is not intended to replace advice given to you by your health care provider. Make sure you discuss any questions you have with your health care provider. Document Released: 04/21/2015 Document Revised: 12/13/2015 Document Reviewed: 01/24/2015 Elsevier Interactive Patient Education  2017 [employer] Prevention in the Home Falls can cause injuries. They can happen to people of all ages. There are many things you can do to make your home safe and to help prevent falls. What can I do on the outside of my home? Regularly fix the edges of walkways and driveways and fix any cracks. Remove anything that might make you trip as you walk through a door, such as a raised step or threshold. Trim any bushes or trees on the path to your home. Use bright outdoor lighting. Clear any walking paths of anything that might make someone trip, such as rocks or tools. Regularly check to see if handrails are loose or broken. Make sure that both sides of any steps have handrails. Any raised decks and porches should have guardrails on the edges. Have any leaves, snow, or ice cleared regularly. Use sand or salt on walking paths during winter. Clean up any spills in your garage right away. This includes oil or grease spills. What can I do in the bathroom? Use night lights. Install grab bars by the toilet and in the tub and shower. Do not use towel bars as grab bars. Use non-skid mats or decals in  the tub or shower. If you need to sit down in the shower, use a plastic, non-slip stool. Keep the floor dry. Clean up any water that spills on the floor as soon as it happens. Remove soap buildup in the tub or shower regularly. Attach bath mats securely with double-sided non-slip rug tape. Do not have throw rugs and other things on the floor that can make you trip. What can I do in the bedroom? Use night lights. Make sure that you have a light by your bed that is easy to reach. Do not use any sheets or blankets that are too big for your bed. They should not hang down onto the floor. Have a firm chair that has side arms. You can use this for support while you get dressed. Do not have throw rugs and other things on the floor that can make you trip. What can I do in the kitchen? Clean up any spills right away. Avoid walking on wet floors. Keep items that you use a lot in easy-to-reach places. If you need to reach something above you, use a strong step stool that has a grab bar. Keep electrical cords out of the way. Do not use floor polish or wax that makes floors slippery. If you must use wax, use non-skid floor wax. Do not have throw rugs and other things on the floor that can make you trip. What can I do with my stairs? Do not leave any items on the stairs. Make sure that there are handrails  on both sides of the stairs and use them. Fix handrails that are broken or loose. Make sure that handrails are as long as the stairways. Check any carpeting to make sure that it is firmly attached to the stairs. Fix any carpet that is loose or worn. Avoid having throw rugs at the top or bottom of the stairs. If you do have throw rugs, attach them to the floor with carpet tape. Make sure that you have a light switch at the top of the stairs and the bottom of the stairs. If you do not have them, ask someone to add them for you. What else can I do to help prevent falls? Wear shoes that: Do not have high  heels. Have rubber bottoms. Are comfortable and fit you well. Are closed at the toe. Do not wear sandals. If you use a stepladder: Make sure that it is fully opened. Do not climb a closed stepladder. Make sure that both sides of the stepladder are locked into place. Ask someone to hold it for you, if possible. Clearly mark and make sure that you can see: Any grab bars or handrails. First and last steps. Where the edge of each step is. Use tools that help you move around (mobility aids) if they are needed. These include: Canes. Walkers. Scooters. Crutches. Turn on the lights when you go into a dark area. Replace any light bulbs as soon as they burn out. Set up your furniture so you have a clear path. Avoid moving your furniture around. If any of your floors are uneven, fix them. If there are any pets around you, be aware of where they are. Review your medicines with your doctor. Some medicines can make you feel dizzy. This can increase your chance of falling. Ask your doctor what other things that you can do to help prevent falls. This information is not intended to replace advice given to you by your health care provider. Make sure you discuss any questions you have with your health care provider. Document Released: 01/19/2009 Document Revised: 08/31/2015 Document Reviewed: 04/29/2014 Elsevier Interactive Patient Education  2017 [employer].

## 2020-11-20 ENCOUNTER — Inpatient Hospital Stay: Payer: Medicare HMO | Admitting: Internal Medicine

## 2020-11-20 ENCOUNTER — Telehealth: Payer: Self-pay | Admitting: Medical Oncology

## 2020-11-20 ENCOUNTER — Inpatient Hospital Stay: Payer: Medicare HMO

## 2020-11-20 NOTE — Telephone Encounter (Signed)
Son and [facility] notified of appts for tomorrow .

## 2020-11-20 NOTE — Telephone Encounter (Signed)
No show today-  Dawn Gomez said she did not know about her appts today .  She kept saying "Im going for the [facility]  today ".  She does not know where and said her friend Hoyle Sauer is taking her.   I called Carolyn and no VM,  I left message on her son's phone to call me.

## 2020-11-21 ENCOUNTER — Inpatient Hospital Stay: Payer: Medicare HMO

## 2020-11-21 ENCOUNTER — Other Ambulatory Visit: Payer: Self-pay

## 2020-11-21 ENCOUNTER — Inpatient Hospital Stay (HOSPITAL_BASED_OUTPATIENT_CLINIC_OR_DEPARTMENT_OTHER): Payer: Medicare HMO | Admitting: Internal Medicine

## 2020-11-21 ENCOUNTER — Encounter: Payer: Self-pay | Admitting: Internal Medicine

## 2020-11-21 VITALS — BP 99/56 | HR 68 | Temp 97.9°F | Resp 19 | Ht 60.0 in | Wt 147.3 lb

## 2020-11-21 DIAGNOSIS — C3490 Malignant neoplasm of unspecified part of unspecified bronchus or lung: Secondary | ICD-10-CM

## 2020-11-21 DIAGNOSIS — Z79899 Other long term (current) drug therapy: Secondary | ICD-10-CM | POA: Diagnosis not present

## 2020-11-21 DIAGNOSIS — C787 Secondary malignant neoplasm of liver and intrahepatic bile duct: Secondary | ICD-10-CM | POA: Diagnosis not present

## 2020-11-21 DIAGNOSIS — C7951 Secondary malignant neoplasm of bone: Secondary | ICD-10-CM | POA: Diagnosis not present

## 2020-11-21 DIAGNOSIS — C778 Secondary and unspecified malignant neoplasm of lymph nodes of multiple regions: Secondary | ICD-10-CM | POA: Diagnosis not present

## 2020-11-21 DIAGNOSIS — C3432 Malignant neoplasm of lower lobe, left bronchus or lung: Secondary | ICD-10-CM

## 2020-11-21 DIAGNOSIS — Z5111 Encounter for antineoplastic chemotherapy: Secondary | ICD-10-CM | POA: Diagnosis not present

## 2020-11-21 DIAGNOSIS — Z95828 Presence of other vascular implants and grafts: Secondary | ICD-10-CM

## 2020-11-21 LAB — CMP (CANCER CENTER ONLY)
ALT: 12 U/L (ref 0–44)
AST: 17 U/L (ref 15–41)
Albumin: 3.7 g/dL (ref 3.5–5.0)
Alkaline Phosphatase: 37 U/L — ABNORMAL LOW (ref 38–126)
Anion gap: 8 (ref 5–15)
BUN: 19 mg/dL (ref 8–23)
CO2: 28 mmol/L (ref 22–32)
Calcium: 9.2 mg/dL (ref 8.9–10.3)
Chloride: 103 mmol/L (ref 98–111)
Creatinine: 0.79 mg/dL (ref 0.44–1.00)
GFR, Estimated: >60 mL/min (ref >60)
Glucose, Bld: 80 mg/dL (ref 70–99)
Potassium: 3.9 mmol/L (ref 3.5–5.1)
Sodium: 139 mmol/L (ref 135–145)
Total Bilirubin: 0.4 mg/dL (ref 0.3–1.2)
Total Protein: 6 g/dL — ABNORMAL LOW (ref 6.5–8.1)

## 2020-11-21 LAB — CBC WITH DIFFERENTIAL (CANCER CENTER ONLY)
Abs Immature Granulocytes: 0.03 10*3/uL (ref 0.00–0.07)
Basophils Absolute: 0 10*3/uL (ref 0.0–0.1)
Basophils Relative: 1 %
Eosinophils Absolute: 0.1 10*3/uL (ref 0.0–0.5)
Eosinophils Relative: 4 %
HCT: 27.4 % — ABNORMAL LOW (ref 36.0–46.0)
Hemoglobin: 8.8 g/dL — ABNORMAL LOW (ref 12.0–15.0)
Immature Granulocytes: 1 %
Lymphocytes Relative: 34 %
Lymphs Abs: 1.1 10*3/uL (ref 0.7–4.0)
MCH: 31.1 pg (ref 26.0–34.0)
MCHC: 32.1 g/dL (ref 30.0–36.0)
MCV: 96.8 fL (ref 80.0–100.0)
Monocytes Absolute: 0.6 10*3/uL (ref 0.1–1.0)
Monocytes Relative: 18 %
Neutro Abs: 1.3 10*3/uL — ABNORMAL LOW (ref 1.7–7.7)
Neutrophils Relative %: 42 %
Platelet Count: 173 10*3/uL (ref 150–400)
RBC: 2.83 MIL/uL — ABNORMAL LOW (ref 3.87–5.11)
RDW: 17.2 % — ABNORMAL HIGH (ref 11.5–15.5)
WBC Count: 3.1 10*3/uL — ABNORMAL LOW (ref 4.0–10.5)
nRBC: 0 % (ref 0.0–0.2)

## 2020-11-21 MED ORDER — HEPARIN SOD (PORK) LOCK FLUSH 100 UNIT/ML IV SOLN
500.0000 [IU] | Freq: Once | INTRAVENOUS | Status: AC | PRN
  Administered 2020-11-21: 500 [IU]

## 2020-11-21 MED ORDER — SODIUM CHLORIDE 0.9 % IV SOLN
100.0000 mg/m2 | Freq: Once | INTRAVENOUS | Status: AC
  Administered 2020-11-21: 170 mg via INTRAVENOUS
  Filled 2020-11-21: qty 8.5

## 2020-11-21 MED ORDER — TRILACICLIB DIHYDROCHLORIDE INJECTION 300 MG
240.0000 mg/m2 | Freq: Once | INTRAVENOUS | Status: AC
  Administered 2020-11-21: 405 mg via INTRAVENOUS
  Filled 2020-11-21: qty 27

## 2020-11-21 MED ORDER — SODIUM CHLORIDE 0.9 % IV SOLN
Freq: Once | INTRAVENOUS | Status: AC
Start: 2020-11-21 — End: 2020-11-21

## 2020-11-21 MED ORDER — PALONOSETRON HCL INJECTION 0.25 MG/5ML
0.2500 mg | Freq: Once | INTRAVENOUS | Status: AC
  Administered 2020-11-21: 0.25 mg via INTRAVENOUS
  Filled 2020-11-21: qty 5

## 2020-11-21 MED ORDER — SODIUM CHLORIDE 0.9 % IV SOLN
10.0000 mg | Freq: Once | INTRAVENOUS | Status: AC
  Administered 2020-11-21: 10 mg via INTRAVENOUS
  Filled 2020-11-21: qty 10

## 2020-11-21 MED ORDER — SODIUM CHLORIDE 0.9 % IV SOLN
340.0000 mg | Freq: Once | INTRAVENOUS | Status: AC
  Administered 2020-11-21: 340 mg via INTRAVENOUS
  Filled 2020-11-21: qty 34

## 2020-11-21 MED ORDER — SODIUM CHLORIDE 0.9% FLUSH
10.0000 mL | INTRAVENOUS | Status: DC | PRN
  Administered 2020-11-21: 10 mL

## 2020-11-21 MED ORDER — SODIUM CHLORIDE 0.9% FLUSH
10.0000 mL | Freq: Once | INTRAVENOUS | Status: AC
  Administered 2020-11-21: 10 mL

## 2020-11-21 MED ORDER — SODIUM CHLORIDE 0.9 % IV SOLN
150.0000 mg | Freq: Once | INTRAVENOUS | Status: AC
  Administered 2020-11-21: 150 mg via INTRAVENOUS
  Filled 2020-11-21: qty 150

## 2020-11-21 NOTE — Progress Notes (Signed)
Per Dr. Julien Nordmann, ok for for treatment today with [facility].

## 2020-11-21 NOTE — Patient Instructions (Signed)
[facility]  Discharge Instructions: Thank you for choosing [facility] to provide your oncology and hematology care.   If you have a lab appointment with the [facility], please go directly to the [facility] and check in at the registration area.   Wear comfortable clothing and clothing appropriate for easy access to any Portacath or PICC line.   We strive to give you quality time with your provider. You may need to reschedule your appointment if you arrive late (15 or more minutes).  Arriving late affects you and other patients whose appointments are after yours.  Also, if you miss three or more appointments without notifying the office, you may be dismissed from the clinic at the provider's discretion.      For prescription refill requests, have your pharmacy contact our office and allow 72 hours for refills to be completed.    Today you received the following chemotherapy and/or immunotherapy agents: Cosella, Carboplatin, and Etoposide.    To help prevent nausea and vomiting after your treatment, we encourage you to take your nausea medication as directed.  BELOW ARE SYMPTOMS THAT SHOULD BE REPORTED IMMEDIATELY: *FEVER GREATER THAN 100.4 F (38 C) OR HIGHER *CHILLS OR SWEATING *NAUSEA AND VOMITING THAT IS NOT CONTROLLED WITH YOUR NAUSEA MEDICATION *UNUSUAL SHORTNESS OF BREATH *UNUSUAL BRUISING OR BLEEDING *URINARY PROBLEMS (pain or burning when urinating, or frequent urination) *BOWEL PROBLEMS (unusual diarrhea, constipation, pain near the anus) TENDERNESS IN MOUTH AND THROAT WITH OR WITHOUT PRESENCE OF ULCERS (sore throat, sores in mouth, or a toothache) UNUSUAL RASH, SWELLING OR PAIN  UNUSUAL VAGINAL DISCHARGE OR ITCHING   Items with * indicate a potential emergency and should be followed up as soon as possible or go to the Emergency Department if any problems should occur.  Please show the CHEMOTHERAPY ALERT CARD or IMMUNOTHERAPY  ALERT CARD at check-in to the Emergency Department and triage nurse.  Should you have questions after your visit or need to cancel or reschedule your appointment, please contact [facility]  Dept: [phone]  and follow the prompts.  Office hours are 8:00 a.m. to 4:30 p.m. Monday - Friday. Please note that voicemails left after 4:00 p.m. may not be returned until the following business day.  We are closed weekends and major holidays. You have access to a nurse at all times for urgent questions. Please call the main number to the clinic Dept: [phone] and follow the prompts.   For any non-urgent questions, you may also contact your provider using MyChart. We now offer e-Visits for anyone 83 and older to request care online for non-urgent symptoms. For details visit [email or web address].   Also download the MyChart app! Go to the app store, search "MyChart", open the app, select [facility], and log in with your MyChart username and password.  Due to Covid, a mask is required upon entering the hospital/clinic. If you do not have a mask, one will be given to you upon arrival. For doctor visits, patients may have 1 support person aged 45 or older with them. For treatment visits, patients cannot have anyone with them due to current Covid guidelines and our immunocompromised population.

## 2020-11-21 NOTE — Progress Notes (Signed)
[facility] Telephone:([phone]) [phone]   Fax:(336) [phone]  OFFICE PROGRESS NOTE  Biagio Borg, MD [place] [address]  DIAGNOSIS: Extensive stage (T3, N2, M1 C) small cell lung cancer presented with large left lower lobe lung mass in addition to right hilar and mediastinal lymphadenopathy as well as satellite nodule in the left lower lobe and peripheral pleural lesion as well as metastatic liver lesions diagnosed in May 2022.  PRIOR THERAPY: None  CURRENT THERAPY: Systemic chemotherapy with carboplatin for AUC of 5 from day 1, etoposide 100 Mg/M2 on days 1, 2 and 3 with Cosela before the chemotherapy as well as Imfinzi 1500 Mg IV on day 1 every 3 weeks.  Status post 3 cycles.  First cycle started September 18, 2020.    INTERVAL HISTORY: Dawn Gomez 85 y.o. female returns to the clinic today for follow-up visit accompanied by her son.  The patient is feeling fine today with no concerning complaints except for mild fatigue.  She has been tolerating her systemic chemotherapy fairly well.  She was supposed to start cycle #4 yesterday but she missed her appointment.  She is here today for reevaluation before starting cycle #4.  She denied having any chest pain, but has the baseline shortness of breath and she is currently on home oxygen.  She denied having any fever or chills.  She has no nausea, vomiting, diarrhea or constipation.  She has no headache or visual changes.  MEDICAL HISTORY: Past Medical History:  Diagnosis Date   Abnormal heart rhythm    Aortic aneurysm (HCC)    Aortic atherosclerosis ([facility]) 03/03/2019   Dyspnea    Hypothyroid    OSA (obstructive sleep apnea)    on cpap   Psoriasis 02/05/2017   Pulmonary fibrosis (HCC)    SVT (supraventricular tachycardia) (HCC)    Thyroid disease    hypo    ALLERGIES:  is allergic to lipitor [atorvastatin], codeine, hydromorphone hcl, levaquin [levofloxacin in d5w], morphine,  oxycodone-acetaminophen, propoxyphene n-acetaminophen, and simvastatin.  MEDICATIONS:  Current Outpatient Medications  Medication Sig Dispense Refill   acetaminophen (TYLENOL) 500 MG tablet Take 500 mg by mouth every 6 (six) hours as needed.     Ascorbic Acid (VITAMIN C PO) Take by mouth.     Azelastine-Fluticasone 137-50 MCG/ACT SUSP Place 1 spray into the nose every 12 (twelve) hours. 23 g 5   Blood Glucose Monitoring Suppl (TRUE METRIX METER) w/Device KIT USE AS DIRECTED 1 kit 0   CALCIUM-MAGNESIUM-ZINC PO Take 1 tablet by mouth daily.     cholecalciferol (VITAMIN D) 1000 units tablet Take 5,000 Units by mouth daily.     diltiazem (CARDIZEM CD) 180 MG 24 hr capsule Take 1 capsule (180 mg total) by mouth daily. 90 capsule 2   escitalopram (LEXAPRO) 20 MG tablet Take 1 tablet (20 mg total) by mouth at bedtime. 90 tablet 3   fenofibrate 160 MG tablet TAKE 1 TABLET EVERY DAY 90 tablet 1   fluocinonide (LIDEX) 0.05 % external solution APP EXT AA OF SCALP NIGHTLY  3   furosemide (LASIX) 80 MG tablet Take 1 tablet (80 mg total) by mouth daily. 90 tablet 3   guaiFENesin (MUCINEX) 600 MG 12 hr tablet Take 2 tablets (1,200 mg total) by mouth 2 (two) times daily as needed. 60 tablet 2   HYDROcodone-acetaminophen (NORCO/VICODIN) 5-325 MG tablet Take 1 tablet by mouth every 6 (six) hours as needed. 30 tablet 0  ipratropium-albuterol (DUONEB) 0.5-2.5 (3) MG/3ML SOLN Take 3 mLs by nebulization every 4 (four) hours as needed. 360 mL 0   levothyroxine (SYNTHROID) 100 MCG tablet TAKE 1 TABLET EVERY DAY BEFORE BREAKFAST 90 tablet 2   lidocaine-prilocaine (EMLA) cream 30 Squeeze  1/2 tsp on a cotton ball and apply to skin over port a cath site. Do not rub it in. Cover with plastic wrap. Apply 1-2 hours prior to your treatment. (Patient taking differently: 30 Squeeze  1/2 tsp on a cotton ball and apply to skin over port a cath site. Do not rub it in. Cover with plastic wrap. Apply 1-2 hours prior to your  treatment.) 30 g 0   lisinopril (ZESTRIL) 5 MG tablet Take 1 tablet (5 mg total) by mouth daily. 90 tablet 1   potassium chloride SA (KLOR-CON) 20 MEQ tablet TAKE 1 TABLET EVERY DAY WITH FOOD 90 tablet 3   prochlorperazine (COMPAZINE) 10 MG tablet Take 1 tablet (10 mg total) by mouth every 6 (six) hours as needed for nausea or vomiting. 30 tablet 0   rosuvastatin (CRESTOR) 20 MG tablet Take 1 tablet (20 mg total) by mouth daily. 90 tablet 3   tiZANidine (ZANAFLEX) 2 MG tablet TAKE 1 TABLET THREE TIMES DAILY 270 tablet 1   traMADol (ULTRAM) 50 MG tablet Take 1 tablet (50 mg total) by mouth 4 (four) times daily. 360 tablet 1   traZODone (DESYREL) 50 MG tablet Take 1 tablet (50 mg total) by mouth at bedtime as needed for sleep. 90 tablet 1   vitamin B-12 (CYANOCOBALAMIN) 1000 MCG tablet Take 500 mcg by mouth daily.     vitamin C (ASCORBIC ACID) 500 MG tablet Take 500 mg by mouth daily.     Vitamin D, Ergocalciferol, (DRISDOL) 50000 units CAPS capsule Take 1 capsule (50,000 Units total) by mouth every 7 (seven) days. 12 capsule 0   vitamin E 400 UNIT capsule Take 200 Units by mouth daily.     No current facility-administered medications for this visit.    SURGICAL HISTORY:  Past Surgical History:  Procedure Laterality Date   ABDOMINAL HYSTERECTOMY     APPENDECTOMY     CHOLECYSTECTOMY     CRANIOTOMY     for aneurysms   CRANIOTOMY  1980 x 2   aneurysmal clipping   HERNIA REPAIR     IR IMAGING GUIDED PORT INSERTION  09/15/2020   TOTAL ABDOMINAL HYSTERECTOMY     TUBAL LIGATION      REVIEW OF SYSTEMS:  A comprehensive review of systems was negative except for: Constitutional: positive for fatigue Respiratory: positive for dyspnea on exertion   PHYSICAL EXAMINATION: General appearance: alert, cooperative, appears older than stated age, and no distress Head: Normocephalic, without obvious abnormality, atraumatic Neck: no adenopathy, no JVD, supple, symmetrical, trachea midline, and thyroid  not enlarged, symmetric, no tenderness/mass/nodules Lymph nodes: Cervical, supraclavicular, and axillary nodes normal. Resp: clear to auscultation bilaterally Back: symmetric, no curvature. ROM normal. No CVA tenderness. Cardio: regular rate and rhythm, S1, S2 normal, no murmur, click, rub or gallop GI: soft, non-tender; bowel sounds normal; no masses,  no organomegaly Extremities: extremities normal, atraumatic, no cyanosis or edema  ECOG PERFORMANCE STATUS: 1 - Symptomatic but completely ambulatory  Blood pressure (!) 99/56, pulse 68, temperature 97.9 F (36.6 C), temperature source Tympanic, resp. rate 19, height 5' (1.524 m), weight 147 lb 4.8 oz (66.8 kg), SpO2 97 %.  LABORATORY DATA: Lab Results  Component Value Date   WBC 3.1 (L) 11/21/2020  HGB 8.8 (L) 11/21/2020   HCT 27.4 (L) 11/21/2020   MCV 96.8 11/21/2020   PLT 173 11/21/2020      Chemistry      Component Value Date/Time   NA 139 11/21/2020 0931   K 3.9 11/21/2020 0931   CL 103 11/21/2020 0931   CO2 28 11/21/2020 0931   BUN 19 11/21/2020 0931   CREATININE 0.79 11/21/2020 0931   CREATININE 1.50 (H) 12/17/2019 1519      Component Value Date/Time   CALCIUM 9.2 11/21/2020 0931   ALKPHOS 37 (L) 11/21/2020 0931   AST 17 11/21/2020 0931   ALT 12 11/21/2020 0931   BILITOT 0.4 11/21/2020 0931       RADIOGRAPHIC STUDIES: CT Chest W Contrast  Result Date: 10/28/2020 CLINICAL DATA:  Small-cell lung cancer.  Staging. EXAM: CT CHEST, ABDOMEN, AND PELVIS WITH CONTRAST TECHNIQUE: Multidetector CT imaging of the chest, abdomen and pelvis was performed following the standard protocol during bolus administration of intravenous contrast. CONTRAST:  64m OMNIPAQUE IOHEXOL 350 MG/ML SOLN COMPARISON:  PET-CT 09/05/2020 FINDINGS: CT CHEST FINDINGS Cardiovascular: The heart size is normal. No substantial pericardial effusion. Moderate coronary artery calcification is evident. Moderate atherosclerotic calcification is noted in the  wall of the thoracic aorta. Enlargement of the pulmonary outflow tract and main pulmonary arteries suggests pulmonary arterial hypertension. Right Port-A-Cath tip is positioned at the SVC/RA junction. Mediastinum/Nodes: Interval decrease in mediastinal lymphadenopathy. Index AP window lymph node measuring 11 mm today was 13 mm previously (remeasured). 5 mm subcarinal node on 25/2 today was 17 mm previously (remeasured). 5 mm precarinal node on 20/2 was 9 mm previously (remeasured). The esophagus has normal imaging features. No right hilar lymphadenopathy. Abnormal soft tissue attenuation in the left hilum is substantially decreased in the interval. There is no axillary lymphadenopathy. Lungs/Pleura: Centrilobular and paraseptal emphysema evident. Substantial interval reduction in size of the dominant left parahilar mass seen previously within area of ill-defined and non confluent irregular opacity in the retro hilar left lower lobe measuring 3.3 x 2.6 cm today in the region of the pulmonary mass measured previously at 7.2 x 5.9 cm. Diffuse chronic interstitial changes noted. Calcified granuloma identified left lower lobe. No new suspicious pulmonary nodule or mass. Musculoskeletal: Subtle area of sclerosis in the posterior right eighth rib (72/6) is stable in the interval otherwise, no new suspicious lytic or sclerotic osseous abnormality. Bones are diffusely demineralized. CT ABDOMEN PELVIS FINDINGS Hepatobiliary: Interval decrease in liver metastases. 1.5 cm right hepatic lesion on 54/2 today was 3.5 cm (remeasured) previously. 0.8 cm segment IV lesion on 53/2 was 2.4 cm (remeasured) previously. 0.7 cm lesion in segment III (59/2) has decreased from 1.5 cm (remeasured) previously. No new liver lesions on today's study. Nodular liver contour (56/2) raises the question of cirrhosis. Gallbladder surgically absent. No intrahepatic or extrahepatic biliary dilation. Pancreas: No focal mass lesion. No dilatation of the  main duct. No intraparenchymal cyst. No peripancreatic edema. Spleen: No splenomegaly. No focal mass lesion. Adrenals/Urinary Tract: No adrenal nodule or mass. Kidneys unremarkable. No evidence for hydroureter. The urinary bladder appears normal for the degree of distention. Stomach/Bowel: Stomach is moderately distended with food and contrast material. Duodenum is normally positioned as is the ligament of Treitz. No small bowel wall thickening. No small bowel dilatation. The terminal ileum is normal. The appendix is not well visualized, but there is no edema or inflammation in the region of the cecum. No gross colonic mass. No colonic wall thickening. Diverticuli are seen scattered  along the entire length of the colon without CT findings of diverticulitis. Vascular/Lymphatic: There is advanced atherosclerotic calcification of the abdominal aorta without aneurysm. 12 mm portal caval node measured previously is 8 mm short axis today on 56/2. No other abdominal lymphadenopathy on today's study. No pelvic sidewall lymphadenopathy. Reproductive: The uterus is surgically absent. There is no adnexal mass. Other: No intraperitoneal free fluid. Musculoskeletal: Sequelae of prior ventral mesh placement. Vague sclerosis seen previously in the S1 vertebral body has become more prominent in the interval with a relatively well-defined 2.0 cm sclerotic lesion visible on image 78/2 today. 1.3 cm sclerotic lesion noted left anterior iliac crest with another posterior left iliac lesion measuring 1.6 cm adjacent to the SI joint on 74/2. These regions correspond to areas of hypermetabolism on the prior PET-CT. IMPRESSION: 1. Dramatic interval reduction in size of the dominant left parahilar lung mass with near complete interval resolution of mediastinal lymphadenopathy. There is a persistent borderline to minimally enlarged AP window lymph node on the current study. 2. Interval decrease in size of liver metastases. No new or  progressive liver lesions on today's study. 3. No abdominal lymphadenopathy on today's study. 4. Areas of bony hypermetabolism seen on the previous PET-CT have become more densely sclerotic in the interval. Given response to therapy of soft tissue disease, this change probably represents healing of skeletal metastases. 5. Nodular liver contour raises the question of cirrhosis. 6. Enlargement of the pulmonary outflow tract and main pulmonary arteries suggests pulmonary arterial hypertension. 7. Aortic Atherosclerosis (ICD10-I70.0) and Emphysema (ICD10-J43.9). Electronically Signed   By: Misty Stanley M.D.   On: 10/28/2020 11:52   CT Abdomen Pelvis W Contrast  Result Date: 10/28/2020 CLINICAL DATA:  Small-cell lung cancer.  Staging. EXAM: CT CHEST, ABDOMEN, AND PELVIS WITH CONTRAST TECHNIQUE: Multidetector CT imaging of the chest, abdomen and pelvis was performed following the standard protocol during bolus administration of intravenous contrast. CONTRAST:  27m OMNIPAQUE IOHEXOL 350 MG/ML SOLN COMPARISON:  PET-CT 09/05/2020 FINDINGS: CT CHEST FINDINGS Cardiovascular: The heart size is normal. No substantial pericardial effusion. Moderate coronary artery calcification is evident. Moderate atherosclerotic calcification is noted in the wall of the thoracic aorta. Enlargement of the pulmonary outflow tract and main pulmonary arteries suggests pulmonary arterial hypertension. Right Port-A-Cath tip is positioned at the SVC/RA junction. Mediastinum/Nodes: Interval decrease in mediastinal lymphadenopathy. Index AP window lymph node measuring 11 mm today was 13 mm previously (remeasured). 5 mm subcarinal node on 25/2 today was 17 mm previously (remeasured). 5 mm precarinal node on 20/2 was 9 mm previously (remeasured). The esophagus has normal imaging features. No right hilar lymphadenopathy. Abnormal soft tissue attenuation in the left hilum is substantially decreased in the interval. There is no axillary  lymphadenopathy. Lungs/Pleura: Centrilobular and paraseptal emphysema evident. Substantial interval reduction in size of the dominant left parahilar mass seen previously within area of ill-defined and non confluent irregular opacity in the retro hilar left lower lobe measuring 3.3 x 2.6 cm today in the region of the pulmonary mass measured previously at 7.2 x 5.9 cm. Diffuse chronic interstitial changes noted. Calcified granuloma identified left lower lobe. No new suspicious pulmonary nodule or mass. Musculoskeletal: Subtle area of sclerosis in the posterior right eighth rib (72/6) is stable in the interval otherwise, no new suspicious lytic or sclerotic osseous abnormality. Bones are diffusely demineralized. CT ABDOMEN PELVIS FINDINGS Hepatobiliary: Interval decrease in liver metastases. 1.5 cm right hepatic lesion on 54/2 today was 3.5 cm (remeasured) previously. 0.8 cm segment IV lesion  on 53/2 was 2.4 cm (remeasured) previously. 0.7 cm lesion in segment III (59/2) has decreased from 1.5 cm (remeasured) previously. No new liver lesions on today's study. Nodular liver contour (56/2) raises the question of cirrhosis. Gallbladder surgically absent. No intrahepatic or extrahepatic biliary dilation. Pancreas: No focal mass lesion. No dilatation of the main duct. No intraparenchymal cyst. No peripancreatic edema. Spleen: No splenomegaly. No focal mass lesion. Adrenals/Urinary Tract: No adrenal nodule or mass. Kidneys unremarkable. No evidence for hydroureter. The urinary bladder appears normal for the degree of distention. Stomach/Bowel: Stomach is moderately distended with food and contrast material. Duodenum is normally positioned as is the ligament of Treitz. No small bowel wall thickening. No small bowel dilatation. The terminal ileum is normal. The appendix is not well visualized, but there is no edema or inflammation in the region of the cecum. No gross colonic mass. No colonic wall thickening. Diverticuli are  seen scattered along the entire length of the colon without CT findings of diverticulitis. Vascular/Lymphatic: There is advanced atherosclerotic calcification of the abdominal aorta without aneurysm. 12 mm portal caval node measured previously is 8 mm short axis today on 56/2. No other abdominal lymphadenopathy on today's study. No pelvic sidewall lymphadenopathy. Reproductive: The uterus is surgically absent. There is no adnexal mass. Other: No intraperitoneal free fluid. Musculoskeletal: Sequelae of prior ventral mesh placement. Vague sclerosis seen previously in the S1 vertebral body has become more prominent in the interval with a relatively well-defined 2.0 cm sclerotic lesion visible on image 78/2 today. 1.3 cm sclerotic lesion noted left anterior iliac crest with another posterior left iliac lesion measuring 1.6 cm adjacent to the SI joint on 74/2. These regions correspond to areas of hypermetabolism on the prior PET-CT. IMPRESSION: 1. Dramatic interval reduction in size of the dominant left parahilar lung mass with near complete interval resolution of mediastinal lymphadenopathy. There is a persistent borderline to minimally enlarged AP window lymph node on the current study. 2. Interval decrease in size of liver metastases. No new or progressive liver lesions on today's study. 3. No abdominal lymphadenopathy on today's study. 4. Areas of bony hypermetabolism seen on the previous PET-CT have become more densely sclerotic in the interval. Given response to therapy of soft tissue disease, this change probably represents healing of skeletal metastases. 5. Nodular liver contour raises the question of cirrhosis. 6. Enlargement of the pulmonary outflow tract and main pulmonary arteries suggests pulmonary arterial hypertension. 7. Aortic Atherosclerosis (ICD10-I70.0) and Emphysema (ICD10-J43.9). Electronically Signed   By: Misty Stanley M.D.   On: 10/28/2020 11:52    ASSESSMENT AND PLAN: This is a very pleasant  85 years old white female recently diagnosed with extensive stage (T3, N2, M1c) small cell lung cancer presented with large left lower lobe lung mass in addition to right hilar and mediastinal lymphadenopathy as well as satellite nodule in the left lower lobe and peripheral pleural lesion in addition to metastatic liver disease diagnosed in May 2022. The patient is currently undergoing systemic chemotherapy with carboplatin for AUC of 5 on day 1, etoposide 100 Mg/M2 on days 1, 2 and 3 with Cosela before the chemotherapy and Imfinzi 1500 Mg IV every 3 weeks on day 1.  Status post 3 cycles.   The patient has been tolerating this treatment well with no concerning adverse effect except for mild fatigue. I recommended for her to proceed with cycle #4 today as planned. I will see her back for follow-up visit in 3 weeks for evaluation with repeat CT  scan of the chest, abdomen pelvis for restaging of her disease. The patient was advised to call immediately if she has any concerning symptoms in the interval. The patient voices understanding of current disease status and treatment options and is in agreement with the current care plan.  All questions were answered. The patient knows to call the clinic with any problems, questions or concerns. We can certainly see the patient much sooner if necessary.   Disclaimer: This note was dictated with voice recognition software. Similar sounding words can inadvertently be transcribed and may not be corrected upon review.

## 2020-11-22 ENCOUNTER — Inpatient Hospital Stay: Payer: Medicare HMO

## 2020-11-22 ENCOUNTER — Other Ambulatory Visit: Payer: Self-pay

## 2020-11-22 VITALS — BP 105/51 | HR 67 | Temp 98.1°F | Resp 18

## 2020-11-22 DIAGNOSIS — Z79899 Other long term (current) drug therapy: Secondary | ICD-10-CM | POA: Diagnosis not present

## 2020-11-22 DIAGNOSIS — C787 Secondary malignant neoplasm of liver and intrahepatic bile duct: Secondary | ICD-10-CM | POA: Diagnosis not present

## 2020-11-22 DIAGNOSIS — C7951 Secondary malignant neoplasm of bone: Secondary | ICD-10-CM | POA: Diagnosis not present

## 2020-11-22 DIAGNOSIS — C778 Secondary and unspecified malignant neoplasm of lymph nodes of multiple regions: Secondary | ICD-10-CM | POA: Diagnosis not present

## 2020-11-22 DIAGNOSIS — C3432 Malignant neoplasm of lower lobe, left bronchus or lung: Secondary | ICD-10-CM

## 2020-11-22 DIAGNOSIS — Z5111 Encounter for antineoplastic chemotherapy: Secondary | ICD-10-CM | POA: Diagnosis not present

## 2020-11-22 MED ORDER — SODIUM CHLORIDE 0.9 % IV SOLN
10.0000 mg | Freq: Once | INTRAVENOUS | Status: AC
  Administered 2020-11-22: 10 mg via INTRAVENOUS
  Filled 2020-11-22: qty 10

## 2020-11-22 MED ORDER — HEPARIN SOD (PORK) LOCK FLUSH 100 UNIT/ML IV SOLN
500.0000 [IU] | Freq: Once | INTRAVENOUS | Status: AC | PRN
  Administered 2020-11-22: 500 [IU]

## 2020-11-22 MED ORDER — SODIUM CHLORIDE 0.9 % IV SOLN
100.0000 mg/m2 | Freq: Once | INTRAVENOUS | Status: AC
  Administered 2020-11-22: 170 mg via INTRAVENOUS
  Filled 2020-11-22: qty 8.5

## 2020-11-22 MED ORDER — SODIUM CHLORIDE 0.9% FLUSH
10.0000 mL | INTRAVENOUS | Status: DC | PRN
  Administered 2020-11-22: 10 mL

## 2020-11-22 MED ORDER — SODIUM CHLORIDE 0.9 % IV SOLN: Freq: Once | INTRAVENOUS | Status: AC

## 2020-11-22 MED ORDER — TRILACICLIB DIHYDROCHLORIDE INJECTION 300 MG
240.0000 mg/m2 | Freq: Once | INTRAVENOUS | Status: AC
  Administered 2020-11-22: 405 mg via INTRAVENOUS
  Filled 2020-11-22: qty 27

## 2020-11-22 NOTE — Patient Instructions (Signed)
[facility]  Discharge Instructions: Thank you for choosing [facility] to provide your oncology and hematology care.   If you have a lab appointment with the [facility], please go directly to the [facility] and check in at the registration area.   Wear comfortable clothing and clothing appropriate for easy access to any Portacath or PICC line.   We strive to give you quality time with your provider. You may need to reschedule your appointment if you arrive late (15 or more minutes).  Arriving late affects you and other patients whose appointments are after yours.  Also, if you miss three or more appointments without notifying the office, you may be dismissed from the clinic at the provider's discretion.      For prescription refill requests, have your pharmacy contact our office and allow 72 hours for refills to be completed.    Today you received the following chemotherapy and/or immunotherapy agents: Etoposide     To help prevent nausea and vomiting after your treatment, we encourage you to take your nausea medication as directed.  BELOW ARE SYMPTOMS THAT SHOULD BE REPORTED IMMEDIATELY: *FEVER GREATER THAN 100.4 F (38 C) OR HIGHER *CHILLS OR SWEATING *NAUSEA AND VOMITING THAT IS NOT CONTROLLED WITH YOUR NAUSEA MEDICATION *UNUSUAL SHORTNESS OF BREATH *UNUSUAL BRUISING OR BLEEDING *URINARY PROBLEMS (pain or burning when urinating, or frequent urination) *BOWEL PROBLEMS (unusual diarrhea, constipation, pain near the anus) TENDERNESS IN MOUTH AND THROAT WITH OR WITHOUT PRESENCE OF ULCERS (sore throat, sores in mouth, or a toothache) UNUSUAL RASH, SWELLING OR PAIN  UNUSUAL VAGINAL DISCHARGE OR ITCHING   Items with * indicate a potential emergency and should be followed up as soon as possible or go to the Emergency Department if any problems should occur.  Please show the CHEMOTHERAPY ALERT CARD or IMMUNOTHERAPY ALERT CARD at check-in to  the Emergency Department and triage nurse.  Should you have questions after your visit or need to cancel or reschedule your appointment, please contact [facility]  Dept: [phone]  and follow the prompts.  Office hours are 8:00 a.m. to 4:30 p.m. Monday - Friday. Please note that voicemails left after 4:00 p.m. may not be returned until the following business day.  We are closed weekends and major holidays. You have access to a nurse at all times for urgent questions. Please call the main number to the clinic Dept: [phone] and follow the prompts.   For any non-urgent questions, you may also contact your provider using MyChart. We now offer e-Visits for anyone 53 and older to request care online for non-urgent symptoms. For details visit [email or web address].   Also download the MyChart app! Go to the app store, search "MyChart", open the app, select [facility], and log in with your MyChart username and password.  Due to Covid, a mask is required upon entering the hospital/clinic. If you do not have a mask, one will be given to you upon arrival. For doctor visits, patients may have 1 support person aged 80 or older with them. For treatment visits, patients cannot have anyone with them due to current Covid guidelines and our immunocompromised population.

## 2020-11-23 ENCOUNTER — Inpatient Hospital Stay: Payer: Medicare HMO

## 2020-11-23 ENCOUNTER — Other Ambulatory Visit: Payer: Self-pay | Admitting: Internal Medicine

## 2020-11-23 VITALS — BP 107/46 | HR 62 | Temp 97.9°F | Resp 19

## 2020-11-23 DIAGNOSIS — Z95828 Presence of other vascular implants and grafts: Secondary | ICD-10-CM

## 2020-11-23 DIAGNOSIS — C3432 Malignant neoplasm of lower lobe, left bronchus or lung: Secondary | ICD-10-CM | POA: Diagnosis not present

## 2020-11-23 DIAGNOSIS — Z5111 Encounter for antineoplastic chemotherapy: Secondary | ICD-10-CM | POA: Diagnosis not present

## 2020-11-23 DIAGNOSIS — C778 Secondary and unspecified malignant neoplasm of lymph nodes of multiple regions: Secondary | ICD-10-CM | POA: Diagnosis not present

## 2020-11-23 DIAGNOSIS — Z79899 Other long term (current) drug therapy: Secondary | ICD-10-CM | POA: Diagnosis not present

## 2020-11-23 DIAGNOSIS — C787 Secondary malignant neoplasm of liver and intrahepatic bile duct: Secondary | ICD-10-CM | POA: Diagnosis not present

## 2020-11-23 DIAGNOSIS — C7951 Secondary malignant neoplasm of bone: Secondary | ICD-10-CM | POA: Diagnosis not present

## 2020-11-23 MED ORDER — HEPARIN SOD (PORK) LOCK FLUSH 100 UNIT/ML IV SOLN
500.0000 [IU] | Freq: Once | INTRAVENOUS | Status: AC | PRN
  Administered 2020-11-23: 500 [IU]

## 2020-11-23 MED ORDER — LIDOCAINE-PRILOCAINE 2.5-2.5 % EX CREA
TOPICAL_CREAM | CUTANEOUS | 0 refills | Status: AC
Start: 2020-11-23 — End: ?

## 2020-11-23 MED ORDER — SODIUM CHLORIDE 0.9 % IV SOLN: Freq: Once | INTRAVENOUS | Status: AC

## 2020-11-23 MED ORDER — SODIUM CHLORIDE 0.9 % IV SOLN
10.0000 mg | Freq: Once | INTRAVENOUS | Status: AC
  Administered 2020-11-23: 10 mg via INTRAVENOUS
  Filled 2020-11-23: qty 10

## 2020-11-23 MED ORDER — SODIUM CHLORIDE 0.9 % IV SOLN
100.0000 mg/m2 | Freq: Once | INTRAVENOUS | Status: AC
  Administered 2020-11-23: 170 mg via INTRAVENOUS
  Filled 2020-11-23: qty 8.5

## 2020-11-23 MED ORDER — TRILACICLIB DIHYDROCHLORIDE INJECTION 300 MG
240.0000 mg/m2 | Freq: Once | INTRAVENOUS | Status: AC
  Administered 2020-11-23: 405 mg via INTRAVENOUS
  Filled 2020-11-23: qty 27

## 2020-11-23 MED ORDER — SODIUM CHLORIDE 0.9% FLUSH
10.0000 mL | INTRAVENOUS | Status: DC | PRN
Start: 2020-11-23 — End: 2020-11-23
  Administered 2020-11-23: 10 mL

## 2020-11-23 NOTE — Patient Instructions (Signed)
[facility]  Discharge Instructions: Thank you for choosing [facility] to provide your oncology and hematology care.   If you have a lab appointment with the [facility], please go directly to the [facility] and check in at the registration area.   Wear comfortable clothing and clothing appropriate for easy access to any Portacath or PICC line.   We strive to give you quality time with your provider. You may need to reschedule your appointment if you arrive late (15 or more minutes).  Arriving late affects you and other patients whose appointments are after yours.  Also, if you miss three or more appointments without notifying the office, you may be dismissed from the clinic at the provider's discretion.      For prescription refill requests, have your pharmacy contact our office and allow 72 hours for refills to be completed.    Today you received the following chemotherapy and/or immunotherapy agents: Etoposide     To help prevent nausea and vomiting after your treatment, we encourage you to take your nausea medication as directed.  BELOW ARE SYMPTOMS THAT SHOULD BE REPORTED IMMEDIATELY: *FEVER GREATER THAN 100.4 F (38 C) OR HIGHER *CHILLS OR SWEATING *NAUSEA AND VOMITING THAT IS NOT CONTROLLED WITH YOUR NAUSEA MEDICATION *UNUSUAL SHORTNESS OF BREATH *UNUSUAL BRUISING OR BLEEDING *URINARY PROBLEMS (pain or burning when urinating, or frequent urination) *BOWEL PROBLEMS (unusual diarrhea, constipation, pain near the anus) TENDERNESS IN MOUTH AND THROAT WITH OR WITHOUT PRESENCE OF ULCERS (sore throat, sores in mouth, or a toothache) UNUSUAL RASH, SWELLING OR PAIN  UNUSUAL VAGINAL DISCHARGE OR ITCHING   Items with * indicate a potential emergency and should be followed up as soon as possible or go to the Emergency Department if any problems should occur.  Please show the CHEMOTHERAPY ALERT CARD or IMMUNOTHERAPY ALERT CARD at check-in to  the Emergency Department and triage nurse.  Should you have questions after your visit or need to cancel or reschedule your appointment, please contact [facility]  Dept: [phone]  and follow the prompts.  Office hours are 8:00 a.m. to 4:30 p.m. Monday - Friday. Please note that voicemails left after 4:00 p.m. may not be returned until the following business day.  We are closed weekends and major holidays. You have access to a nurse at all times for urgent questions. Please call the main number to the clinic Dept: [phone] and follow the prompts.   For any non-urgent questions, you may also contact your provider using MyChart. We now offer e-Visits for anyone 51 and older to request care online for non-urgent symptoms. For details visit [email or web address].   Also download the MyChart app! Go to the app store, search "MyChart", open the app, select [facility], and log in with your MyChart username and password.  Due to Covid, a mask is required upon entering the hospital/clinic. If you do not have a mask, one will be given to you upon arrival. For doctor visits, patients may have 1 support person aged 48 or older with them. For treatment visits, patients cannot have anyone with them due to current Covid guidelines and our immunocompromised population.

## 2020-12-05 ENCOUNTER — Ambulatory Visit (HOSPITAL_COMMUNITY)
Admission: RE | Admit: 2020-12-05 | Discharge: 2020-12-05 | Disposition: A | Discharge status: Home / Self Care | Payer: Medicare HMO | Source: Ambulatory Visit | Attending: Internal Medicine | Admitting: Internal Medicine

## 2020-12-05 ENCOUNTER — Encounter (HOSPITAL_COMMUNITY): Payer: Self-pay

## 2020-12-05 ENCOUNTER — Other Ambulatory Visit: Payer: Self-pay

## 2020-12-05 DIAGNOSIS — C3490 Malignant neoplasm of unspecified part of unspecified bronchus or lung: Secondary | ICD-10-CM | POA: Diagnosis not present

## 2020-12-05 DIAGNOSIS — C4090 Malignant neoplasm of unspecified bones and articular cartilage of unspecified limb: Secondary | ICD-10-CM | POA: Diagnosis not present

## 2020-12-05 DIAGNOSIS — G35 Multiple sclerosis: Secondary | ICD-10-CM | POA: Diagnosis not present

## 2020-12-05 DIAGNOSIS — K7689 Other specified diseases of liver: Secondary | ICD-10-CM | POA: Diagnosis not present

## 2020-12-05 DIAGNOSIS — K5730 Diverticulosis of large intestine without perforation or abscess without bleeding: Secondary | ICD-10-CM | POA: Diagnosis not present

## 2020-12-05 DIAGNOSIS — J439 Emphysema, unspecified: Secondary | ICD-10-CM | POA: Diagnosis not present

## 2020-12-05 DIAGNOSIS — C787 Secondary malignant neoplasm of liver and intrahepatic bile duct: Secondary | ICD-10-CM | POA: Diagnosis not present

## 2020-12-05 DIAGNOSIS — I700 Atherosclerosis of aorta: Secondary | ICD-10-CM | POA: Diagnosis not present

## 2020-12-05 HISTORY — DX: Malignant (primary) neoplasm, unspecified: C80.1

## 2020-12-05 MED ORDER — IOHEXOL 350 MG/ML SOLN
80.0000 mL | Freq: Once | INTRAVENOUS | Status: AC | PRN
  Administered 2020-12-05: 80 mL via INTRAVENOUS

## 2020-12-05 MED ORDER — HEPARIN SOD (PORK) LOCK FLUSH 100 UNIT/ML IV SOLN
500.0000 [IU] | Freq: Once | INTRAVENOUS | Status: AC
  Administered 2020-12-05: 500 [IU] via INTRAVENOUS

## 2020-12-05 MED ORDER — HEPARIN SOD (PORK) LOCK FLUSH 100 UNIT/ML IV SOLN
INTRAVENOUS | Status: AC
  Filled 2020-12-05: qty 5

## 2020-12-12 DIAGNOSIS — G4733 Obstructive sleep apnea (adult) (pediatric): Secondary | ICD-10-CM | POA: Diagnosis not present

## 2020-12-12 DIAGNOSIS — J449 Chronic obstructive pulmonary disease, unspecified: Secondary | ICD-10-CM | POA: Diagnosis not present

## 2020-12-12 DIAGNOSIS — J8410 Pulmonary fibrosis, unspecified: Secondary | ICD-10-CM | POA: Diagnosis not present

## 2020-12-12 DIAGNOSIS — J9621 Acute and chronic respiratory failure with hypoxia: Secondary | ICD-10-CM | POA: Diagnosis not present

## 2020-12-13 ENCOUNTER — Ambulatory Visit: Payer: Medicare HMO | Admitting: Internal Medicine

## 2020-12-13 ENCOUNTER — Other Ambulatory Visit: Payer: Medicare HMO

## 2020-12-14 ENCOUNTER — Inpatient Hospital Stay (HOSPITAL_BASED_OUTPATIENT_CLINIC_OR_DEPARTMENT_OTHER): Payer: Medicare HMO | Admitting: Internal Medicine

## 2020-12-14 ENCOUNTER — Inpatient Hospital Stay: Payer: Medicare HMO | Attending: Internal Medicine

## 2020-12-14 ENCOUNTER — Other Ambulatory Visit: Payer: Self-pay

## 2020-12-14 VITALS — BP 107/51 | HR 61 | Temp 97.8°F | Resp 19 | Ht 60.0 in | Wt 146.1 lb

## 2020-12-14 DIAGNOSIS — M899 Disorder of bone, unspecified: Secondary | ICD-10-CM | POA: Insufficient documentation

## 2020-12-14 DIAGNOSIS — C3432 Malignant neoplasm of lower lobe, left bronchus or lung: Secondary | ICD-10-CM

## 2020-12-14 DIAGNOSIS — C787 Secondary malignant neoplasm of liver and intrahepatic bile duct: Secondary | ICD-10-CM | POA: Insufficient documentation

## 2020-12-14 DIAGNOSIS — Z95828 Presence of other vascular implants and grafts: Secondary | ICD-10-CM

## 2020-12-14 DIAGNOSIS — Z5111 Encounter for antineoplastic chemotherapy: Secondary | ICD-10-CM

## 2020-12-14 DIAGNOSIS — C3490 Malignant neoplasm of unspecified part of unspecified bronchus or lung: Secondary | ICD-10-CM

## 2020-12-14 LAB — CMP (CANCER CENTER ONLY)
ALT: 9 U/L (ref 0–44)
AST: 14 U/L — ABNORMAL LOW (ref 15–41)
Albumin: 3.5 g/dL (ref 3.5–5.0)
Alkaline Phosphatase: 40 U/L (ref 38–126)
Anion gap: 11 (ref 5–15)
BUN: 9 mg/dL (ref 8–23)
CO2: 27 mmol/L (ref 22–32)
Calcium: 9.3 mg/dL (ref 8.9–10.3)
Chloride: 104 mmol/L (ref 98–111)
Creatinine: 0.77 mg/dL (ref 0.44–1.00)
GFR, Estimated: >60 mL/min (ref >60)
Glucose, Bld: 86 mg/dL (ref 70–99)
Potassium: 3.9 mmol/L (ref 3.5–5.1)
Sodium: 142 mmol/L (ref 135–145)
Total Bilirubin: 0.3 mg/dL (ref 0.3–1.2)
Total Protein: 5.9 g/dL — ABNORMAL LOW (ref 6.5–8.1)

## 2020-12-14 LAB — CBC WITH DIFFERENTIAL (CANCER CENTER ONLY)
Abs Immature Granulocytes: 0.05 10*3/uL (ref 0.00–0.07)
Basophils Absolute: 0 10*3/uL (ref 0.0–0.1)
Basophils Relative: 1 %
Eosinophils Absolute: 0.1 10*3/uL (ref 0.0–0.5)
Eosinophils Relative: 3 %
HCT: 26.1 % — ABNORMAL LOW (ref 36.0–46.0)
Hemoglobin: 8.5 g/dL — ABNORMAL LOW (ref 12.0–15.0)
Immature Granulocytes: 1 %
Lymphocytes Relative: 22 %
Lymphs Abs: 0.9 10*3/uL (ref 0.7–4.0)
MCH: 31.7 pg (ref 26.0–34.0)
MCHC: 32.6 g/dL (ref 30.0–36.0)
MCV: 97.4 fL (ref 80.0–100.0)
Monocytes Absolute: 0.5 10*3/uL (ref 0.1–1.0)
Monocytes Relative: 13 %
Neutro Abs: 2.4 10*3/uL (ref 1.7–7.7)
Neutrophils Relative %: 60 %
Platelet Count: 222 10*3/uL (ref 150–400)
RBC: 2.68 MIL/uL — ABNORMAL LOW (ref 3.87–5.11)
RDW: 16.4 % — ABNORMAL HIGH (ref 11.5–15.5)
WBC Count: 4 10*3/uL (ref 4.0–10.5)
nRBC: 0 % (ref 0.0–0.2)

## 2020-12-14 MED ORDER — SODIUM CHLORIDE 0.9% FLUSH
10.0000 mL | Freq: Once | INTRAVENOUS | Status: AC
  Administered 2020-12-14: 10 mL

## 2020-12-14 MED ORDER — HEPARIN SOD (PORK) LOCK FLUSH 100 UNIT/ML IV SOLN
500.0000 [IU] | Freq: Once | INTRAVENOUS | Status: AC
  Administered 2020-12-14: 500 [IU]

## 2020-12-14 NOTE — Progress Notes (Signed)
[facility] [phone]   [phone]  OFFICE PROGRESS NOTE  Biagio Borg, MD [place] [id number]  DIAGNOSIS: Extensive stage (T3, N2, M1 C) small cell lung cancer presented with large left lower lobe lung mass in addition to right hilar and mediastinal lymphadenopathy as well as satellite nodule in the left lower lobe and peripheral pleural lesion as well as metastatic liver lesions diagnosed in May 2022.  PRIOR THERAPY: Systemic chemotherapy with carboplatin for AUC of 5 from day 1, etoposide 100 Mg/M2 on days 1, 2 and 3 with Cosela before the chemotherapy every 3 weeks.  Status post 3 cycles.  First cycle started September 18, 2020.  Status post 4 cycles.  The patient did not receive immunotherapy because of the extensive psoriasis.  CURRENT THERAPY: Observation.  INTERVAL HISTORY: Dawn Gomez 85 y.o. female returns to the clinic today for follow-up visit.  The patient is feeling fine today with no concerning complaints.  She denied having any current chest pain but continues to have shortness of breath and she is on home oxygen.  She denied having any fever or chills.  She has no nausea, vomiting, diarrhea or constipation.  She denied having any headache or visual changes.  She has no recent weight loss or night sweats.  She tolerated the previous 4 cycles of systemic chemotherapy with carboplatin and Doutova site fairly well.  The patient did not receive immunotherapy during her induction phase of the chemotherapy because of the extensive psoriasis.  She had repeat CT scan of the chest, abdomen pelvis performed recently and she is here for evaluation and discussion of her risk her results.   MEDICAL HISTORY: Past Medical History:  Diagnosis Date   Abnormal heart rhythm    Aortic aneurysm (HCC)    Aortic atherosclerosis ([facility]) 03/03/2019   Cancer (HCC)    Dyspnea    Hypothyroid    OSA (obstructive sleep apnea)    on cpap    Psoriasis 02/05/2017   Pulmonary fibrosis (HCC)    SVT (supraventricular tachycardia) (HCC)    Thyroid disease    hypo    ALLERGIES:  is allergic to lipitor [atorvastatin], codeine, hydromorphone hcl, levaquin [levofloxacin in d5w], morphine, oxycodone-acetaminophen, propoxyphene n-acetaminophen, and simvastatin.  MEDICATIONS:  Current Outpatient Medications  Medication Sig Dispense Refill   acetaminophen (TYLENOL) 500 MG tablet Take 500 mg by mouth every 6 (six) hours as needed.     Ascorbic Acid (VITAMIN C PO) Take by mouth.     Azelastine-Fluticasone 137-50 MCG/ACT SUSP Place 1 spray into the nose every 12 (twelve) hours. 23 g 5   Blood Glucose Monitoring Suppl (TRUE METRIX METER) w/Device KIT USE AS DIRECTED 1 kit 0   CALCIUM-MAGNESIUM-ZINC PO Take 1 tablet by mouth daily.     cholecalciferol (VITAMIN D) 1000 units tablet Take 5,000 Units by mouth daily.     diltiazem (CARDIZEM CD) 180 MG 24 hr capsule Take 1 capsule (180 mg total) by mouth daily. 90 capsule 2   escitalopram (LEXAPRO) 20 MG tablet Take 1 tablet (20 mg total) by mouth at bedtime. 90 tablet 3   fenofibrate 160 MG tablet TAKE 1 TABLET EVERY DAY 90 tablet 1   fluocinonide (LIDEX) 0.05 % external solution APP EXT AA OF SCALP NIGHTLY  3   furosemide (LASIX) 80 MG tablet Take 1 tablet (80 mg total) by mouth daily. 90 tablet 3   guaiFENesin (MUCINEX) 600 MG 12 hr  tablet Take 2 tablets (1,200 mg total) by mouth 2 (two) times daily as needed. 60 tablet 2   HYDROcodone-acetaminophen (NORCO/VICODIN) 5-325 MG tablet Take 1 tablet by mouth every 6 (six) hours as needed. 30 tablet 0   ipratropium-albuterol (DUONEB) 0.5-2.5 (3) MG/3ML SOLN Take 3 mLs by nebulization every 4 (four) hours as needed. 360 mL 0   levothyroxine (SYNTHROID) 100 MCG tablet TAKE 1 TABLET EVERY DAY BEFORE BREAKFAST 90 tablet 2   lidocaine-prilocaine (EMLA) cream 30 Squeeze  1/2 tsp on a cotton ball and apply to skin over port a cath site. Do not rub it in.  Cover with plastic wrap. Apply 1-2 hours prior to your treatment. 30 g 0   lisinopril (ZESTRIL) 5 MG tablet Take 1 tablet (5 mg total) by mouth daily. 90 tablet 1   potassium chloride SA (KLOR-CON) 20 MEQ tablet TAKE 1 TABLET EVERY DAY WITH FOOD 90 tablet 3   prochlorperazine (COMPAZINE) 10 MG tablet Take 1 tablet (10 mg total) by mouth every 6 (six) hours as needed for nausea or vomiting. 30 tablet 0   rosuvastatin (CRESTOR) 20 MG tablet Take 1 tablet (20 mg total) by mouth daily. 90 tablet 3   tiZANidine (ZANAFLEX) 2 MG tablet TAKE 1 TABLET THREE TIMES DAILY 270 tablet 1   traMADol (ULTRAM) 50 MG tablet Take 1 tablet (50 mg total) by mouth 4 (four) times daily. 360 tablet 1   traZODone (DESYREL) 50 MG tablet Take 1 tablet (50 mg total) by mouth at bedtime as needed for sleep. 90 tablet 1   vitamin B-12 (CYANOCOBALAMIN) 1000 MCG tablet Take 500 mcg by mouth daily.     vitamin C (ASCORBIC ACID) 500 MG tablet Take 500 mg by mouth daily.     Vitamin D, Ergocalciferol, (DRISDOL) 50000 units CAPS capsule Take 1 capsule (50,000 Units total) by mouth every 7 (seven) days. 12 capsule 0   vitamin E 400 UNIT capsule Take 200 Units by mouth daily.     No current facility-administered medications for this visit.    SURGICAL HISTORY:  Past Surgical History:  Procedure Laterality Date   ABDOMINAL HYSTERECTOMY     APPENDECTOMY     CHOLECYSTECTOMY     CRANIOTOMY     for aneurysms   CRANIOTOMY  1980 x 2   aneurysmal clipping   HERNIA REPAIR     IR IMAGING GUIDED PORT INSERTION  09/15/2020   TOTAL ABDOMINAL HYSTERECTOMY     TUBAL LIGATION      REVIEW OF SYSTEMS:  Constitutional: positive for fatigue Eyes: negative Ears, nose, mouth, throat, and face: negative Respiratory: positive for dyspnea on exertion Cardiovascular: negative Gastrointestinal: negative Genitourinary:negative Integument/breast: negative Hematologic/lymphatic: negative Musculoskeletal:negative Neurological:  negative Behavioral/Psych: negative Endocrine: negative Allergic/Immunologic: negative   PHYSICAL EXAMINATION: General appearance: alert, cooperative, appears older than stated age, fatigued, and no distress Head: Normocephalic, without obvious abnormality, atraumatic Neck: no adenopathy, no JVD, supple, symmetrical, trachea midline, and thyroid not enlarged, symmetric, no tenderness/mass/nodules Lymph nodes: Cervical, supraclavicular, and axillary nodes normal. Resp: clear to auscultation bilaterally Back: symmetric, no curvature. ROM normal. No CVA tenderness. Cardio: regular rate and rhythm, S1, S2 normal, no murmur, click, rub or gallop GI: soft, non-tender; bowel sounds normal; no masses,  no organomegaly Extremities: extremities normal, atraumatic, no cyanosis or edema Neurologic: Alert and oriented X 3, normal strength and tone. Normal symmetric reflexes. Normal coordination and gait  ECOG PERFORMANCE STATUS: 1 - Symptomatic but completely ambulatory  Blood pressure (!) 107/51, pulse 61,  temperature 97.8 F (36.6 C), temperature source Oral, resp. rate 19, height 5' (1.524 m), weight 146 lb 1.6 oz (66.3 kg), SpO2 97 %.  LABORATORY DATA: Lab Results  Component Value Date   WBC 4.0 12/14/2020   HGB 8.5 (L) 12/14/2020   HCT 26.1 (L) 12/14/2020   MCV 97.4 12/14/2020   PLT 222 12/14/2020      Chemistry      Component Value Date/Time   NA 139 11/21/2020 0931   K 3.9 11/21/2020 0931   CL 103 11/21/2020 0931   CO2 28 11/21/2020 0931   BUN 19 11/21/2020 0931   CREATININE 0.79 11/21/2020 0931   CREATININE 1.50 (H) 12/17/2019 1519      Component Value Date/Time   CALCIUM 9.2 11/21/2020 0931   ALKPHOS 37 (L) 11/21/2020 0931   AST 17 11/21/2020 0931   ALT 12 11/21/2020 0931   BILITOT 0.4 11/21/2020 0931       RADIOGRAPHIC STUDIES: CT Chest W Contrast  Result Date: 12/06/2020 CLINICAL DATA:  Primary Cancer Type: Lung Imaging Indication: Assess response to therapy  Interval therapy since last imaging? Yes Initial Cancer Diagnosis Date: 08/29/2020; Established by: Biopsy-proven Detailed Pathology: Extensive stage small cell lung cancer. Primary Tumor location: Left lower lobe.  Liver metastases. Surgeries: Hysterectomy, appendectomy, cholecystectomy, hernia repair. Chemotherapy: Yes; Ongoing? Yes; Most recent administration: 11/21/2020 Immunotherapy?  Yes; Type: Imfinzi; Ongoing? Yes Radiation therapy? No EXAM: CT CHEST ABDOMEN AND PELVIS WITH CONTRAST TECHNIQUE: Multidetector CT imaging of the chest was performed during intravenous contrast administration. CONTRAST:  29mL OMNIPAQUE IOHEXOL 350 MG/ML SOLN COMPARISON:  Most recent CT chest, abdomen and pelvis 10/26/2020. 09/05/2020 PET-CT. FINDINGS: CT CHEST FINDINGS Cardiovascular: Right chest port catheter. Normal heart size. Three-vessel coronary artery calcifications. No pericardial effusion. Mediastinum/Nodes: No enlarged mediastinal, hilar, or axillary lymph nodes. Thyroid gland, trachea, and esophagus demonstrate no significant findings. Lungs/Pleura: Mild-to-moderate centrilobular and paraseptal emphysema. Continued interval decrease in size of a mass of the superior segment left lower lobe, now measuring 2.1 x 1.3 cm, previously 2.9 x 1.3 cm (series 4, image 67). Large bowl or pneumatocele of the peripheral anterior right lower lobe (series 4, image 79). No pleural effusion or pneumothorax. Musculoskeletal: No chest wall mass. CT ABDOMEN PELVIS FINDINGS Hepatobiliary: Interval decrease in size of multiple hypodense liver metastases, an index lesion of the inferior right lobe of the liver, hepatic segment VI, measuring 1.3 x 1.2 cm, previously 1.7 x 1.4 cm (series 2, image 51), and an index lesion of the peripheral liver dome, hepatic segment VII, measuring 0.6 cm, previously 0.9 cm (series 2, image 38). Somewhat coarse, nodular contour of the liver. Status post cholecystectomy. No biliary ductal dilatation. Pancreas:  Unremarkable. No pancreatic ductal dilatation or surrounding inflammatory changes. Spleen: Normal in size without significant abnormality. Adrenals/Urinary Tract: Adrenal glands are unremarkable. Kidneys are normal, without renal calculi, solid lesion, or hydronephrosis. Bladder is unremarkable. Stomach/Bowel: Stomach is within normal limits. Appendix appears normal. No evidence of bowel wall thickening, distention, or inflammatory changes. Pancolonic diverticulosis. Vascular/Lymphatic: Aortic atherosclerosis. No enlarged abdominal or pelvic lymph nodes. Reproductive: Status post hysterectomy. Other: No abdominal wall hernia or abnormality. No abdominopelvic ascites. Musculoskeletal: Interval increase in sclerosis of multiple osseous lesions, for example of the left ilium (series 2, image 73) and multiple lumbar vertebral bodies (series 6, image 80). IMPRESSION: 1. Continued interval decrease in size of a mass of the superior segment left lower lobe. 2. No persistent mediastinal lymphadenopathy. 3. Interval decrease in size of multiple hypodense liver  metastases. 4. Interval increase in sclerosis of multiple osseous lesions. 5. Constellation of findings is consistent with treatment response of primary lung malignancy and widespread metastatic disease. 6. Somewhat coarse, nodular contour of the liver, suggestive of cirrhosis. 7. Emphysema. 8. Coronary artery disease. Aortic Atherosclerosis (ICD10-I70.0) and Emphysema (ICD10-J43.9). Electronically Signed   By: Eddie Candle M.D.   On: 12/06/2020 11:04   CT Abdomen Pelvis W Contrast  Result Date: 12/06/2020 CLINICAL DATA:  Primary Cancer Type: Lung Imaging Indication: Assess response to therapy Interval therapy since last imaging? Yes Initial Cancer Diagnosis Date: 08/29/2020; Established by: Biopsy-proven Detailed Pathology: Extensive stage small cell lung cancer. Primary Tumor location: Left lower lobe.  Liver metastases. Surgeries: Hysterectomy, appendectomy,  cholecystectomy, hernia repair. Chemotherapy: Yes; Ongoing? Yes; Most recent administration: 11/21/2020 Immunotherapy?  Yes; Type: Imfinzi; Ongoing? Yes Radiation therapy? No EXAM: CT CHEST ABDOMEN AND PELVIS WITH CONTRAST TECHNIQUE: Multidetector CT imaging of the chest was performed during intravenous contrast administration. CONTRAST:  45mL OMNIPAQUE IOHEXOL 350 MG/ML SOLN COMPARISON:  Most recent CT chest, abdomen and pelvis 10/26/2020. 09/05/2020 PET-CT. FINDINGS: CT CHEST FINDINGS Cardiovascular: Right chest port catheter. Normal heart size. Three-vessel coronary artery calcifications. No pericardial effusion. Mediastinum/Nodes: No enlarged mediastinal, hilar, or axillary lymph nodes. Thyroid gland, trachea, and esophagus demonstrate no significant findings. Lungs/Pleura: Mild-to-moderate centrilobular and paraseptal emphysema. Continued interval decrease in size of a mass of the superior segment left lower lobe, now measuring 2.1 x 1.3 cm, previously 2.9 x 1.3 cm (series 4, image 67). Large bowl or pneumatocele of the peripheral anterior right lower lobe (series 4, image 79). No pleural effusion or pneumothorax. Musculoskeletal: No chest wall mass. CT ABDOMEN PELVIS FINDINGS Hepatobiliary: Interval decrease in size of multiple hypodense liver metastases, an index lesion of the inferior right lobe of the liver, hepatic segment VI, measuring 1.3 x 1.2 cm, previously 1.7 x 1.4 cm (series 2, image 51), and an index lesion of the peripheral liver dome, hepatic segment VII, measuring 0.6 cm, previously 0.9 cm (series 2, image 38). Somewhat coarse, nodular contour of the liver. Status post cholecystectomy. No biliary ductal dilatation. Pancreas: Unremarkable. No pancreatic ductal dilatation or surrounding inflammatory changes. Spleen: Normal in size without significant abnormality. Adrenals/Urinary Tract: Adrenal glands are unremarkable. Kidneys are normal, without renal calculi, solid lesion, or hydronephrosis.  Bladder is unremarkable. Stomach/Bowel: Stomach is within normal limits. Appendix appears normal. No evidence of bowel wall thickening, distention, or inflammatory changes. Pancolonic diverticulosis. Vascular/Lymphatic: Aortic atherosclerosis. No enlarged abdominal or pelvic lymph nodes. Reproductive: Status post hysterectomy. Other: No abdominal wall hernia or abnormality. No abdominopelvic ascites. Musculoskeletal: Interval increase in sclerosis of multiple osseous lesions, for example of the left ilium (series 2, image 73) and multiple lumbar vertebral bodies (series 6, image 80). IMPRESSION: 1. Continued interval decrease in size of a mass of the superior segment left lower lobe. 2. No persistent mediastinal lymphadenopathy. 3. Interval decrease in size of multiple hypodense liver metastases. 4. Interval increase in sclerosis of multiple osseous lesions. 5. Constellation of findings is consistent with treatment response of primary lung malignancy and widespread metastatic disease. 6. Somewhat coarse, nodular contour of the liver, suggestive of cirrhosis. 7. Emphysema. 8. Coronary artery disease. Aortic Atherosclerosis (ICD10-I70.0) and Emphysema (ICD10-J43.9). Electronically Signed   By: Eddie Candle M.D.   On: 12/06/2020 11:04    ASSESSMENT AND PLAN: This is a very pleasant 85 years old white female recently diagnosed with extensive stage (T3, N2, M1c) small cell lung cancer presented with large left lower lobe  lung mass in addition to right hilar and mediastinal lymphadenopathy as well as satellite nodule in the left lower lobe and peripheral pleural lesion in addition to metastatic liver disease diagnosed in May 2022. The patient underwent systemic chemotherapy with carboplatin for AUC of 5 on day 1, etoposide 100 Mg/M2 on days 1, 2 and 3 with Cosela before the chemotherapy every 3 weeks on day 1.  Status post 4 cycles.  She tolerated her treatment fairly well and she has good response to this  treatment. She had repeat CT scan of the chest, abdomen and pelvis performed recently.  I personally and independently reviewed the scan and discussed the results with the patient today. Her scan showed no concerning findings for disease progression. I recommended for the patient to continue on observation with repeat CT scan of the chest, abdomen and pelvis in 2 months. The patient was advised to call immediately if she has any concerning symptoms in the interval. The patient voices understanding of current disease status and treatment options and is in agreement with the current care plan.  All questions were answered. The patient knows to call the clinic with any problems, questions or concerns. We can certainly see the patient much sooner if necessary.   Disclaimer: This note was dictated with voice recognition software. Similar sounding words can inadvertently be transcribed and may not be corrected upon review.

## 2020-12-18 ENCOUNTER — Encounter: Payer: Self-pay | Admitting: Internal Medicine

## 2020-12-18 ENCOUNTER — Ambulatory Visit (INDEPENDENT_AMBULATORY_CARE_PROVIDER_SITE_OTHER): Payer: Medicare HMO | Admitting: Internal Medicine

## 2020-12-18 ENCOUNTER — Other Ambulatory Visit: Payer: Self-pay

## 2020-12-18 VITALS — BP 118/62 | HR 62 | Temp 98.1°F | Ht 60.0 in | Wt 146.0 lb

## 2020-12-18 DIAGNOSIS — I700 Atherosclerosis of aorta: Secondary | ICD-10-CM

## 2020-12-18 DIAGNOSIS — Z23 Encounter for immunization: Secondary | ICD-10-CM | POA: Diagnosis not present

## 2020-12-18 DIAGNOSIS — E559 Vitamin D deficiency, unspecified: Secondary | ICD-10-CM | POA: Diagnosis not present

## 2020-12-18 DIAGNOSIS — J9611 Chronic respiratory failure with hypoxia: Secondary | ICD-10-CM | POA: Diagnosis not present

## 2020-12-18 DIAGNOSIS — E782 Mixed hyperlipidemia: Secondary | ICD-10-CM | POA: Diagnosis not present

## 2020-12-18 DIAGNOSIS — I2510 Atherosclerotic heart disease of native coronary artery without angina pectoris: Secondary | ICD-10-CM | POA: Insufficient documentation

## 2020-12-18 DIAGNOSIS — R739 Hyperglycemia, unspecified: Secondary | ICD-10-CM | POA: Diagnosis not present

## 2020-12-18 DIAGNOSIS — C3432 Malignant neoplasm of lower lobe, left bronchus or lung: Secondary | ICD-10-CM

## 2020-12-18 DIAGNOSIS — E039 Hypothyroidism, unspecified: Secondary | ICD-10-CM

## 2020-12-18 HISTORY — DX: Atherosclerotic heart disease of native coronary artery without angina pectoris: I25.10

## 2020-12-18 NOTE — Patient Instructions (Addendum)
Please consider the new Covid vaccine called Novavax  You had the flu shot today  Please continue all other medications as before, and refills have been done if requested.  Please have the pharmacy call with any other refills you may need.  Please continue your efforts at being more active, low cholesterol diet, and weight control.  You are otherwise up to date with prevention measures today.  Please keep your appointments with your specialists as you may have planned  Please make an Appointment to return in 6 months, or sooner if needed

## 2020-12-18 NOTE — Progress Notes (Signed)
Patient ID: Alford Highland, female   DOB: 1936/03/04, 85 y.o.   MRN: [id number]        Chief Complaint: follow up lung ca, chronic resp failure, low vit d, aortic atherosclerosis, hypothyoridism, hld, hyperglycemia       HPI:  Bryttany Tortorelli is a 85 y.o. female here overall doing well now stable on home o2 4L continuous.  Pt denies chest pain, increased sob or doe, wheezing, orthopnea, PND, increased LE swelling, palpitations, dizziness or syncope.   Pt denies polydipsia, polyuria, or new focal neuro s/s.  Pt recent excellent f/u with oncology now considered cured for f/u CT in 2 mo.  Due for flu shot.  Denies hyper or hypo thyroid symptoms such as voice, skin or hair change.   Pt denies fever, wt loss, night sweats, loss of appetite, or other constitutional symptoms    Wt Readings from Last 3 Encounters:  12/18/20 146 lb (66.2 kg)  12/14/20 146 lb 1.6 oz (66.3 kg)  11/21/20 147 lb 4.8 oz (66.8 kg)   BP Readings from Last 3 Encounters:  12/18/20 118/62  12/14/20 (!) 107/51  11/23/20 (!) 107/46         Past Medical History:  Diagnosis Date   Abnormal heart rhythm    Aortic aneurysm (HCC)    Aortic atherosclerosis ([place]) 03/03/2019   Cancer ([place])    Coronary artery calcification seen on CT scan 12/18/2020   Dyspnea    Hypothyroid    OSA (obstructive sleep apnea)    on cpap   Psoriasis 02/05/2017   Pulmonary fibrosis (HCC)    SVT (supraventricular tachycardia) ([place])    Thyroid disease    hypo   Past Surgical History:  Procedure Laterality Date   ABDOMINAL HYSTERECTOMY     APPENDECTOMY     CHOLECYSTECTOMY     CRANIOTOMY     for aneurysms   CRANIOTOMY  1980 x 2   aneurysmal clipping   HERNIA REPAIR     IR IMAGING GUIDED PORT INSERTION  09/15/2020   TOTAL ABDOMINAL HYSTERECTOMY     TUBAL LIGATION      reports that she quit smoking about 14 years ago. Her smoking use included cigarettes. She has a 100.00 pack-year smoking history. She has never used smokeless tobacco.  She reports current alcohol use. She reports that she does not use drugs. family history includes Allergies in her sister; Bone cancer in her father; Breast cancer in her sister; Cancer in her sister; Clotting disorder in her father and sister; Emphysema in her father, mother, and sister; Heart disease in her father and mother; Lung cancer in her father and mother; Prostate cancer in her father; Rheum arthritis in her father, mother, and sister. Allergies  Allergen Reactions   Lipitor [Atorvastatin] Shortness Of Breath, Diarrhea and Other (See Comments)    Dizzy, pain all over.   Codeine    Hydromorphone Hcl     REACTION: dementia   Levaquin [Levofloxacin In D5w]     Shock per pt but can take cipro   Morphine    Oxycodone-Acetaminophen    Propoxyphene N-Acetaminophen    Simvastatin    Current Outpatient Medications on File Prior to Visit  Medication Sig Dispense Refill   acetaminophen (TYLENOL) 500 MG tablet Take 500 mg by mouth every 6 (six) hours as needed.     Ascorbic Acid (VITAMIN C PO) Take by mouth.     Azelastine-Fluticasone 137-50 MCG/ACT SUSP Place 1 spray into the nose every 12 (  twelve) hours. 23 g 5   Blood Glucose Monitoring Suppl (TRUE METRIX METER) w/Device KIT USE AS DIRECTED 1 kit 0   CALCIUM-MAGNESIUM-ZINC PO Take 1 tablet by mouth daily.     cholecalciferol (VITAMIN D) 1000 units tablet Take 5,000 Units by mouth daily.     diltiazem (CARDIZEM CD) 180 MG 24 hr capsule Take 1 capsule (180 mg total) by mouth daily. 90 capsule 2   escitalopram (LEXAPRO) 20 MG tablet Take 1 tablet (20 mg total) by mouth at bedtime. 90 tablet 3   fenofibrate 160 MG tablet TAKE 1 TABLET EVERY DAY 90 tablet 1   fluocinonide (LIDEX) 0.05 % external solution APP EXT AA OF SCALP NIGHTLY  3   furosemide (LASIX) 80 MG tablet Take 1 tablet (80 mg total) by mouth daily. 90 tablet 3   guaiFENesin (MUCINEX) 600 MG 12 hr tablet Take 2 tablets (1,200 mg total) by mouth 2 (two) times daily as needed. 60  tablet 2   ipratropium-albuterol (DUONEB) 0.5-2.5 (3) MG/3ML SOLN Take 3 mLs by nebulization every 4 (four) hours as needed. 360 mL 0   levothyroxine (SYNTHROID) 100 MCG tablet TAKE 1 TABLET EVERY DAY BEFORE BREAKFAST 90 tablet 2   lidocaine-prilocaine (EMLA) cream 30 Squeeze  1/2 tsp on a cotton ball and apply to skin over port a cath site. Do not rub it in. Cover with plastic wrap. Apply 1-2 hours prior to your treatment. 30 g 0   lisinopril (ZESTRIL) 5 MG tablet Take 1 tablet (5 mg total) by mouth daily. 90 tablet 1   potassium chloride SA (KLOR-CON) 20 MEQ tablet TAKE 1 TABLET EVERY DAY WITH FOOD 90 tablet 3   prochlorperazine (COMPAZINE) 10 MG tablet Take 1 tablet (10 mg total) by mouth every 6 (six) hours as needed for nausea or vomiting. 30 tablet 0   rosuvastatin (CRESTOR) 20 MG tablet Take 1 tablet (20 mg total) by mouth daily. 90 tablet 3   tiZANidine (ZANAFLEX) 2 MG tablet TAKE 1 TABLET THREE TIMES DAILY 270 tablet 1   traMADol (ULTRAM) 50 MG tablet Take 1 tablet (50 mg total) by mouth 4 (four) times daily. 360 tablet 1   traZODone (DESYREL) 50 MG tablet Take 1 tablet (50 mg total) by mouth at bedtime as needed for sleep. 90 tablet 1   vitamin B-12 (CYANOCOBALAMIN) 1000 MCG tablet Take 500 mcg by mouth daily.     vitamin C (ASCORBIC ACID) 500 MG tablet Take 500 mg by mouth daily.     Vitamin D, Ergocalciferol, (DRISDOL) 50000 units CAPS capsule Take 1 capsule (50,000 Units total) by mouth every 7 (seven) days. 12 capsule 0   vitamin E 400 UNIT capsule Take 200 Units by mouth daily.     HYDROcodone-acetaminophen (NORCO/VICODIN) 5-325 MG tablet Take 1 tablet by mouth every 6 (six) hours as needed. (Patient not taking: Reported on 12/18/2020) 30 tablet 0   No current facility-administered medications on file prior to visit.        ROS:  All others reviewed and negative.  Objective        PE:  BP 118/62 (BP Location: Right Arm, Patient Position: Sitting, Cuff Size: Normal)   Pulse 62    Temp 98.1 F (36.7 C) (Oral)   Ht 5' (1.524 m)   Wt 146 lb (66.2 kg)   SpO2 100%   BMI 28.51 kg/m                 Constitutional: Pt appears in NAD  HENT: Head: NCAT.                Right Ear: External ear normal.                 Left Ear: External ear normal.                Eyes: . Pupils are equal, round, and reactive to light. Conjunctivae and EOM are normal               Nose: without d/c or deformity               Neck: Neck supple. Gross normal ROM               Cardiovascular: Normal rate and regular rhythm.                 Pulmonary/Chest: Effort normal and breath sounds without rales or wheezing.                Abd:  Soft, NT, ND, + BS, no organomegaly               Neurological: Pt is alert. At baseline orientation, motor grossly intact               Skin: Skin is warm. No rashes, no other new lesions, LE edema - none               Psychiatric: Pt behavior is normal without agitation   Micro: none  Cardiac tracings I have personally interpreted today:  none  Pertinent Radiological findings (summarize): none   Lab Results  Component Value Date   WBC 4.0 12/14/2020   HGB 8.5 (L) 12/14/2020   HCT 26.1 (L) 12/14/2020   PLT 222 12/14/2020   GLUCOSE 86 12/14/2020   CHOL 115 08/17/2020   TRIG 102.0 08/17/2020   HDL 49.80 08/17/2020   LDLDIRECT 138.0 02/05/2017   LDLCALC 44 08/17/2020   ALT 9 12/14/2020   AST 14 (L) 12/14/2020   NA 142 12/14/2020   K 3.9 12/14/2020   CL 104 12/14/2020   CREATININE 0.77 12/14/2020   BUN 9 12/14/2020   CO2 27 12/14/2020   TSH 1.05 08/17/2020   INR 1.1 08/29/2020   HGBA1C 5.4 08/17/2020   Assessment/Plan:  Jasa Dundon is a 85 y.o. White or Caucasian [1] female with  has a past medical history of Abnormal heart rhythm, Aortic aneurysm ([place]), Aortic atherosclerosis ([place]) (03/03/2019), Cancer [facility] 2), Coronary artery calcification seen on CT scan (12/18/2020), Dyspnea, Hypothyroid, OSA (obstructive sleep apnea),  Psoriasis (02/05/2017), Pulmonary fibrosis ([place]), SVT (supraventricular tachycardia) ([place]), and Thyroid disease.  Small cell carcinoma of lower lobe of left lung (HCC) Excellent reponse to tx, for f/u CT 2 mo  Chronic respiratory failure (HCC) Stable on 4L continous,  to f/u any worsening symptoms or concerns  Vitamin D deficiency Last vitamin D Lab Results  Component Value Date   VD25OH 53.31 08/17/2020   Stable, cont oral replacement   Hypothyroidism Lab Results  Component Value Date   TSH 1.05 08/17/2020   Stable, pt to continue levothyroxine  Aortic atherosclerosis (HCC) Pt to continue low chol idet, exercise, and  crestor 20  Hyperlipidemia Lab Results  Component Value Date   LDLCALC 44 08/17/2020   Stable, pt to continue current statin crestor 20   Hyperglycemia Lab Results  Component Value Date   HGBA1C 5.4 08/17/2020   Stable, pt to continue current medical treatment  -  diet  Followup: No follow-ups on file.  Cathlean Cower, MD 12/24/2020 11:57 AM [facility]

## 2020-12-24 NOTE — Assessment & Plan Note (Signed)
Pt to continue low chol idet, exercise, and  crestor 20

## 2020-12-24 NOTE — Assessment & Plan Note (Signed)
Excellent reponse to tx, for f/u CT 2 mo

## 2020-12-24 NOTE — Assessment & Plan Note (Signed)
Lab Results  Component Value Date   HGBA1C 5.4 08/17/2020   Stable, pt to continue current medical treatment  - diet

## 2020-12-24 NOTE — Assessment & Plan Note (Signed)
Stable on 4L continous,  to f/u any worsening symptoms or concerns

## 2020-12-24 NOTE — Assessment & Plan Note (Signed)
Last vitamin D Lab Results  Component Value Date   VD25OH 53.31 08/17/2020   Stable, cont oral replacement

## 2020-12-24 NOTE — Assessment & Plan Note (Signed)
Lab Results  Component Value Date   TSH 1.05 08/17/2020   Stable, pt to continue levothyroxine

## 2020-12-24 NOTE — Assessment & Plan Note (Signed)
Lab Results  Component Value Date   LDLCALC 44 08/17/2020   Stable, pt to continue current statin crestor 20

## 2020-12-28 ENCOUNTER — Telehealth: Payer: Self-pay

## 2020-12-28 NOTE — Telephone Encounter (Signed)
Patient called to see if Lovena Le still has the 3 copies that was made of the patients X-Rays in the 9/12 office visit. The patient believes she left them in the office.   Callback at [phone]

## 2020-12-29 ENCOUNTER — Other Ambulatory Visit: Payer: Self-pay | Admitting: Internal Medicine

## 2020-12-29 NOTE — Telephone Encounter (Signed)
Unable to reach patient or leave a message. I don't have copies of x-ray

## 2021-01-09 ENCOUNTER — Ambulatory Visit: Payer: Medicare HMO | Admitting: Internal Medicine

## 2021-01-09 ENCOUNTER — Ambulatory Visit: Payer: Medicare HMO

## 2021-01-09 ENCOUNTER — Other Ambulatory Visit: Payer: Medicare HMO

## 2021-01-11 DIAGNOSIS — J8410 Pulmonary fibrosis, unspecified: Secondary | ICD-10-CM | POA: Diagnosis not present

## 2021-01-11 DIAGNOSIS — G4733 Obstructive sleep apnea (adult) (pediatric): Secondary | ICD-10-CM | POA: Diagnosis not present

## 2021-01-11 DIAGNOSIS — J449 Chronic obstructive pulmonary disease, unspecified: Secondary | ICD-10-CM | POA: Diagnosis not present

## 2021-01-11 DIAGNOSIS — J9621 Acute and chronic respiratory failure with hypoxia: Secondary | ICD-10-CM | POA: Diagnosis not present

## 2021-01-12 ENCOUNTER — Telehealth: Payer: Self-pay | Admitting: Internal Medicine

## 2021-01-12 NOTE — Telephone Encounter (Signed)
Patient lost the copies of her chest X-ray when she came in. Requesting 3 copies of the imaging from 12/06/2020. If it could be mailed to her address on file

## 2021-01-17 ENCOUNTER — Other Ambulatory Visit: Payer: Self-pay | Admitting: Internal Medicine

## 2021-01-17 NOTE — Telephone Encounter (Signed)
Mailed 01/12/21

## 2021-01-18 NOTE — Telephone Encounter (Signed)
Patient stated she never received the copies through the mail. Verified the address and relayed it had been sent on 01/12/2021. Requesting it to be mailed again. Offered pick up option but patient is unable to get transportation .

## 2021-01-22 NOTE — Telephone Encounter (Signed)
Copies of chest imaging has been mailed.

## 2021-01-24 ENCOUNTER — Other Ambulatory Visit: Payer: Self-pay | Admitting: Internal Medicine

## 2021-01-30 ENCOUNTER — Telehealth: Payer: Self-pay | Admitting: Internal Medicine

## 2021-01-30 NOTE — Telephone Encounter (Signed)
Spoke with patient and imaging results were printed and placed in the mail

## 2021-01-30 NOTE — Telephone Encounter (Signed)
Patient calling to request copies of lab results from 08-17-2020  Patient is requesting a call back

## 2021-02-06 ENCOUNTER — Ambulatory Visit: Payer: Medicare HMO | Admitting: Internal Medicine

## 2021-02-06 ENCOUNTER — Ambulatory Visit: Payer: Medicare HMO

## 2021-02-06 ENCOUNTER — Other Ambulatory Visit: Payer: Medicare HMO

## 2021-02-09 ENCOUNTER — Inpatient Hospital Stay: Payer: Medicare HMO | Attending: Internal Medicine

## 2021-02-09 DIAGNOSIS — C3432 Malignant neoplasm of lower lobe, left bronchus or lung: Secondary | ICD-10-CM | POA: Insufficient documentation

## 2021-02-09 DIAGNOSIS — C787 Secondary malignant neoplasm of liver and intrahepatic bile duct: Secondary | ICD-10-CM | POA: Insufficient documentation

## 2021-02-11 DIAGNOSIS — G4733 Obstructive sleep apnea (adult) (pediatric): Secondary | ICD-10-CM | POA: Diagnosis not present

## 2021-02-11 DIAGNOSIS — J8410 Pulmonary fibrosis, unspecified: Secondary | ICD-10-CM | POA: Diagnosis not present

## 2021-02-11 DIAGNOSIS — J449 Chronic obstructive pulmonary disease, unspecified: Secondary | ICD-10-CM | POA: Diagnosis not present

## 2021-02-11 DIAGNOSIS — J9621 Acute and chronic respiratory failure with hypoxia: Secondary | ICD-10-CM | POA: Diagnosis not present

## 2021-02-12 ENCOUNTER — Telehealth: Payer: Self-pay | Admitting: Medical Oncology

## 2021-02-12 ENCOUNTER — Inpatient Hospital Stay: Payer: Medicare HMO | Admitting: Internal Medicine

## 2021-03-02 ENCOUNTER — Other Ambulatory Visit: Payer: Self-pay

## 2021-03-02 ENCOUNTER — Inpatient Hospital Stay: Payer: Medicare HMO

## 2021-03-02 ENCOUNTER — Ambulatory Visit (HOSPITAL_COMMUNITY)
Admission: RE | Admit: 2021-03-02 | Discharge: 2021-03-02 | Disposition: A | Discharge status: Home / Self Care | Payer: Medicare HMO | Source: Ambulatory Visit | Attending: Internal Medicine | Admitting: Internal Medicine

## 2021-03-02 DIAGNOSIS — J439 Emphysema, unspecified: Secondary | ICD-10-CM | POA: Diagnosis not present

## 2021-03-02 DIAGNOSIS — C3490 Malignant neoplasm of unspecified part of unspecified bronchus or lung: Secondary | ICD-10-CM | POA: Diagnosis not present

## 2021-03-02 DIAGNOSIS — K769 Liver disease, unspecified: Secondary | ICD-10-CM | POA: Diagnosis not present

## 2021-03-02 DIAGNOSIS — C7951 Secondary malignant neoplasm of bone: Secondary | ICD-10-CM | POA: Diagnosis not present

## 2021-03-02 DIAGNOSIS — I700 Atherosclerosis of aorta: Secondary | ICD-10-CM | POA: Diagnosis not present

## 2021-03-02 LAB — POCT I-STAT CREATININE: Creatinine, Ser: 0.7 mg/dL (ref 0.44–1.00)

## 2021-03-02 MED ORDER — HEPARIN SOD (PORK) LOCK FLUSH 100 UNIT/ML IV SOLN
500.0000 [IU] | Freq: Once | INTRAVENOUS | Status: AC
  Administered 2021-03-02: 500 [IU] via INTRAVENOUS

## 2021-03-02 MED ORDER — IOHEXOL 350 MG/ML SOLN
80.0000 mL | Freq: Once | INTRAVENOUS | Status: AC | PRN
  Administered 2021-03-02: 80 mL via INTRAVENOUS

## 2021-03-05 ENCOUNTER — Inpatient Hospital Stay: Payer: Medicare HMO

## 2021-03-05 ENCOUNTER — Other Ambulatory Visit: Payer: Self-pay

## 2021-03-05 ENCOUNTER — Other Ambulatory Visit: Payer: Self-pay | Admitting: Internal Medicine

## 2021-03-05 ENCOUNTER — Encounter: Payer: Self-pay | Admitting: Internal Medicine

## 2021-03-05 NOTE — Telephone Encounter (Signed)
Please refill as per office routine med refill policy (all routine meds to be refilled for 3 mo or monthly (per pt preference) up to one year from last visit, then month to month grace period for 3 mo, then further med refills will have to be denied) ? ?

## 2021-03-06 ENCOUNTER — Encounter: Payer: Self-pay | Admitting: *Deleted

## 2021-03-06 ENCOUNTER — Inpatient Hospital Stay (HOSPITAL_BASED_OUTPATIENT_CLINIC_OR_DEPARTMENT_OTHER): Payer: Medicare HMO | Admitting: Internal Medicine

## 2021-03-06 ENCOUNTER — Other Ambulatory Visit: Payer: Self-pay

## 2021-03-06 ENCOUNTER — Inpatient Hospital Stay (HOSPITAL_BASED_OUTPATIENT_CLINIC_OR_DEPARTMENT_OTHER): Payer: Medicare HMO | Admitting: *Deleted

## 2021-03-06 VITALS — BP 115/57 | HR 63 | Temp 97.9°F | Resp 18 | Ht 60.0 in | Wt 148.8 lb

## 2021-03-06 DIAGNOSIS — C3432 Malignant neoplasm of lower lobe, left bronchus or lung: Secondary | ICD-10-CM

## 2021-03-06 DIAGNOSIS — C3490 Malignant neoplasm of unspecified part of unspecified bronchus or lung: Secondary | ICD-10-CM | POA: Diagnosis not present

## 2021-03-06 DIAGNOSIS — C787 Secondary malignant neoplasm of liver and intrahepatic bile duct: Secondary | ICD-10-CM | POA: Diagnosis not present

## 2021-03-06 NOTE — Progress Notes (Signed)
Oncology Nurse Navigator Documentation  Oncology Nurse Navigator Flowsheets 03/06/2021 09/20/2020 08/31/2020  Abnormal Finding Date - - 08/14/2020  Confirmed Diagnosis Date - - 08/29/2020  Diagnosis Status - - Confirmed Diagnosis Complete  Planned Course of Treatment - - Chemotherapy  Phase of Treatment - Chemo Chemo  Chemotherapy Actual Start Date: - 09/20/2020 -  Navigator Follow Up Date: - 10/04/2020 09/05/2020  Navigator Follow Up Reason: - Follow-up Appointment Appointment Review  Navigator Location [place]  Referral Date to RadOnc/MedOnc - - 08/23/2020  Navigator Encounter Type Clinic/MDC/I spoke with Ms. Berghuis today. I was great seeing Ms Bryk.  I help to explain her plan of care with observation for 2 months then scan and follow up with Dr. Julien Nordmann. I did ask that she call if she gets a new present cough or not feeling well to give [place] a call.  She verbalized understanding.  Treatment Clinic/MDC;Initial MedOnc  Treatment Initiated Date - 09/20/2020 -  Patient Visit Type Follow-up;MedOnc Other MedOnc;Initial  Treatment Phase Follow-up Treatment Pre-Tx/Tx Discussion  Barriers/Navigation Needs Education - Education  Education Other - Newly Diagnosed Cancer Education;Other  Interventions Education;Psycho-Social Support Psycho-Social Support Education;Psycho-Social Support  Acuity Level 2-Minimal Needs (1-2 Barriers Identified) Level 2-Minimal Needs (1-2 Barriers Identified) Level 2-Minimal Needs (1-2 Barriers Identified)  Education Method Verbal - Verbal;Written  Time Spent with Patient 15 15 60

## 2021-03-06 NOTE — Progress Notes (Signed)
Oncology Nurse Navigator Documentation  Oncology Nurse Navigator Flowsheets 03/06/2021 09/20/2020 08/31/2020  Abnormal Finding Date - - 08/14/2020  Confirmed Diagnosis Date - - 08/29/2020  Diagnosis Status - - Confirmed Diagnosis Complete  Planned Course of Treatment - - Chemotherapy  Phase of Treatment - Chemo Chemo  Chemotherapy Actual Start Date: - 09/20/2020 -  Navigator Follow Up Date: - 10/04/2020 09/05/2020  Navigator Follow Up Reason: - Follow-up Appointment Appointment Review  Navigator Location [place]  Referral Date to RadOnc/MedOnc - - 08/23/2020  Navigator Encounter Type Clinic/MDC Treatment Clinic/MDC;Initial MedOnc  Treatment Initiated Date - 09/20/2020 -  Patient Visit Type Follow-up;MedOnc Other MedOnc;Initial  Treatment Phase Follow-up Treatment Pre-Tx/Tx Discussion  Barriers/Navigation Needs Education - Education  Education Other - Newly Diagnosed Cancer Education;Other  Interventions Education;Psycho-Social Support Psycho-Social Support Education;Psycho-Social Support  Acuity Level 2-Minimal Needs (1-2 Barriers Identified) Level 2-Minimal Needs (1-2 Barriers Identified) Level 2-Minimal Needs (1-2 Barriers Identified)  Education Method Verbal - Verbal;Written  Time Spent with Patient 15 15 60

## 2021-03-06 NOTE — Progress Notes (Signed)
[facility] [phone]   [phone]  OFFICE PROGRESS NOTE  Biagio Borg, MD [place] [address]  DIAGNOSIS: Extensive stage (T3, N2, M1 C) small cell lung cancer presented with large left lower lobe lung mass in addition to right hilar and mediastinal lymphadenopathy as well as satellite nodule in the left lower lobe and peripheral pleural lesion as well as metastatic liver lesions diagnosed in May 2022.  PRIOR THERAPY: Systemic chemotherapy with carboplatin for AUC of 5 from day 1, etoposide 100 Mg/M2 on days 1, 2 and 3 with Cosela before the chemotherapy every 3 weeks.  Status post 3 cycles.  First cycle started September 18, 2020.  Status post 4 cycles.  The patient did not receive immunotherapy because of the extensive psoriasis.  CURRENT THERAPY: Observation.  INTERVAL HISTORY: Dawn Gomez 85 y.o. female returns to the clinic today for follow-up visit.  The patient is feeling fine today with no concerning complaints except for the baseline shortness of breath.  The patient denied having any current chest pain, cough or hemoptysis.  She denied having any fever or chills.  She has no nausea, vomiting, diarrhea or constipation.  She has no headache or visual changes.  She is here today for evaluation and repeat CT scan of the chest, abdomen and pelvis for restaging of her disease.   MEDICAL HISTORY: Past Medical History:  Diagnosis Date   Abnormal heart rhythm    Aortic aneurysm (HCC)    Aortic atherosclerosis ([place]) 03/03/2019   Cancer ([place])    Coronary artery calcification seen on CT scan 12/18/2020   Dyspnea    Hypothyroid    OSA (obstructive sleep apnea)    on cpap   Psoriasis 02/05/2017   Pulmonary fibrosis (HCC)    SVT (supraventricular tachycardia) (HCC)    Thyroid disease    hypo    ALLERGIES:  is allergic to lipitor [atorvastatin], codeine, hydromorphone hcl, levaquin [levofloxacin in d5w], morphine,  oxycodone-acetaminophen, propoxyphene n-acetaminophen, and simvastatin.  MEDICATIONS:  Current Outpatient Medications  Medication Sig Dispense Refill   acetaminophen (TYLENOL) 500 MG tablet Take 500 mg by mouth every 6 (six) hours as needed.     Ascorbic Acid (VITAMIN C PO) Take by mouth.     Azelastine-Fluticasone 137-50 MCG/ACT SUSP Place 1 spray into the nose every 12 (twelve) hours. 23 g 5   Blood Glucose Monitoring Suppl (TRUE METRIX METER) w/Device KIT USE AS DIRECTED 1 kit 0   CALCIUM-MAGNESIUM-ZINC PO Take 1 tablet by mouth daily.     cholecalciferol (VITAMIN D) 1000 units tablet Take 5,000 Units by mouth daily.     diltiazem (CARDIZEM CD) 180 MG 24 hr capsule TAKE 1 CAPSULE EVERY DAY 90 capsule 1   escitalopram (LEXAPRO) 20 MG tablet Take 1 tablet (20 mg total) by mouth at bedtime. 90 tablet 3   fenofibrate 160 MG tablet TAKE 1 TABLET EVERY DAY 90 tablet 1   fluocinonide (LIDEX) 0.05 % external solution APP EXT AA OF SCALP NIGHTLY  3   furosemide (LASIX) 80 MG tablet Take 1 tablet (80 mg total) by mouth daily. 90 tablet 3   guaiFENesin (MUCINEX) 600 MG 12 hr tablet Take 2 tablets (1,200 mg total) by mouth 2 (two) times daily as needed. 60 tablet 2   HYDROcodone-acetaminophen (NORCO/VICODIN) 5-325 MG tablet Take 1 tablet by mouth every 6 (six) hours as needed. (Patient not taking: Reported on 12/18/2020) 30 tablet 0  ipratropium-albuterol (DUONEB) 0.5-2.5 (3) MG/3ML SOLN Take 3 mLs by nebulization every 4 (four) hours as needed. 360 mL 0   levothyroxine (SYNTHROID) 100 MCG tablet TAKE 1 TABLET EVERY DAY BEFORE BREAKFAST 90 tablet 2   lidocaine-prilocaine (EMLA) cream 30 Squeeze  1/2 tsp on a cotton ball and apply to skin over port a cath site. Do not rub it in. Cover with plastic wrap. Apply 1-2 hours prior to your treatment. 30 g 0   lisinopril (ZESTRIL) 5 MG tablet TAKE 1 TABLET EVERY DAY 90 tablet 3   potassium chloride SA (KLOR-CON) 20 MEQ tablet TAKE 1 TABLET EVERY DAY WITH FOOD  90 tablet 3   prochlorperazine (COMPAZINE) 10 MG tablet Take 1 tablet (10 mg total) by mouth every 6 (six) hours as needed for nausea or vomiting. 30 tablet 0   rosuvastatin (CRESTOR) 20 MG tablet Take 1 tablet (20 mg total) by mouth daily. 90 tablet 3   tiZANidine (ZANAFLEX) 2 MG tablet TAKE 1 TABLET THREE TIMES DAILY 270 tablet 1   traMADol (ULTRAM) 50 MG tablet TAKE 1 TABLET BY MOUTH FOUR TIMES DAILY. 360 tablet 1   traZODone (DESYREL) 50 MG tablet TAKE 1 TABLET (50 MG TOTAL) BY MOUTH AT BEDTIME AS NEEDED FOR SLEEP. 90 tablet 1   vitamin B-12 (CYANOCOBALAMIN) 1000 MCG tablet Take 500 mcg by mouth daily.     vitamin C (ASCORBIC ACID) 500 MG tablet Take 500 mg by mouth daily.     Vitamin D, Ergocalciferol, (DRISDOL) 50000 units CAPS capsule Take 1 capsule (50,000 Units total) by mouth every 7 (seven) days. 12 capsule 0   vitamin E 400 UNIT capsule Take 200 Units by mouth daily.     No current facility-administered medications for this visit.    SURGICAL HISTORY:  Past Surgical History:  Procedure Laterality Date   ABDOMINAL HYSTERECTOMY     APPENDECTOMY     CHOLECYSTECTOMY     CRANIOTOMY     for aneurysms   CRANIOTOMY  1980 x 2   aneurysmal clipping   HERNIA REPAIR     IR IMAGING GUIDED PORT INSERTION  09/15/2020   TOTAL ABDOMINAL HYSTERECTOMY     TUBAL LIGATION      REVIEW OF SYSTEMS:  Constitutional: positive for fatigue Eyes: negative Ears, nose, mouth, throat, and face: negative Respiratory: positive for dyspnea on exertion Cardiovascular: negative Gastrointestinal: negative Genitourinary:negative Integument/breast: negative Hematologic/lymphatic: negative Musculoskeletal:positive for arthralgias and muscle weakness Neurological: negative Behavioral/Psych: negative Endocrine: negative Allergic/Immunologic: negative   PHYSICAL EXAMINATION: General appearance: alert, cooperative, appears older than stated age, fatigued, and no distress Head: Normocephalic, without  obvious abnormality, atraumatic Neck: no adenopathy, no JVD, supple, symmetrical, trachea midline, and thyroid not enlarged, symmetric, no tenderness/mass/nodules Lymph nodes: Cervical, supraclavicular, and axillary nodes normal. Resp: clear to auscultation bilaterally Back: symmetric, no curvature. ROM normal. No CVA tenderness. Cardio: regular rate and rhythm, S1, S2 normal, no murmur, click, rub or gallop GI: soft, non-tender; bowel sounds normal; no masses,  no organomegaly Extremities: extremities normal, atraumatic, no cyanosis or edema Neurologic: Alert and oriented X 3, normal strength and tone. Normal symmetric reflexes. Normal coordination and gait  ECOG PERFORMANCE STATUS: 1 - Symptomatic but completely ambulatory  Blood pressure (!) 115/57, pulse 63, temperature 97.9 F (36.6 C), temperature source Temporal, resp. rate 18, height 5' (1.524 m), weight 148 lb 12.8 oz (67.5 kg), SpO2 97 %.  LABORATORY DATA: Lab Results  Component Value Date   WBC 4.0 12/14/2020   HGB 8.5 (L)  12/14/2020   HCT 26.1 (L) 12/14/2020   MCV 97.4 12/14/2020   PLT 222 12/14/2020      Chemistry      Component Value Date/Time   NA 142 12/14/2020 1048   K 3.9 12/14/2020 1048   CL 104 12/14/2020 1048   CO2 27 12/14/2020 1048   BUN 9 12/14/2020 1048   CREATININE 0.70 03/02/2021 1355   CREATININE 0.77 12/14/2020 1048   CREATININE 1.50 (H) 12/17/2019 1519      Component Value Date/Time   CALCIUM 9.3 12/14/2020 1048   ALKPHOS 40 12/14/2020 1048   AST 14 (L) 12/14/2020 1048   ALT 9 12/14/2020 1048   BILITOT 0.3 12/14/2020 1048       RADIOGRAPHIC STUDIES: CT Chest W Contrast  Result Date: 03/03/2021 CLINICAL DATA:  Metastatic left lower lobe small-cell lung cancer restaging, chemotherapy complete EXAM: CT CHEST, ABDOMEN, AND PELVIS WITH CONTRAST TECHNIQUE: Multidetector CT imaging of the chest, abdomen and pelvis was performed following the standard protocol during bolus administration of  intravenous contrast. CONTRAST:  29m OMNIPAQUE IOHEXOL 350 MG/ML SOLN, additional oral enteric contrast COMPARISON:  12/05/2020 FINDINGS: CT CHEST FINDINGS Cardiovascular: Right chest port catheter. Aortic atherosclerosis. Normal heart size. Three-vessel coronary artery calcifications. No pericardial effusion. Mediastinum/Nodes: No enlarged mediastinal, hilar, or axillary lymph nodes. Thyroid gland, trachea, and esophagus demonstrate no significant findings. Lungs/Pleura: Mild-to-moderate centrilobular and paraseptal emphysema. Continued interval decrease in size of a subpleural mass of the posterior superior segment left lower lobe, measuring 1.4 x 1.0 cm, previously 2.1 x 1.3 cm (series 506, image 77). Large bulla or pneumatocele of the right lower lobe unchanged (series 506, image 91). No pleural effusion or pneumothorax. Musculoskeletal: No chest wall mass. CT ABDOMEN PELVIS FINDINGS Hepatobiliary: Coarse, nodular contour of the liver. Multiple low-attenuation lesions of the liver are unchanged, and an index lesion of the anterior right lobe, hepatic segment VI, measuring 1.4 x 1.2 cm (series 504, image 56), an index lesion of the peripheral liver dome, hepatic segment VII, measuring 0.6 cm (series 504, image 44). Status post cholecystectomy. No biliary ductal dilatation. Pancreas: Unremarkable. No pancreatic ductal dilatation or surrounding inflammatory changes. Spleen: Normal in size without significant abnormality. Adrenals/Urinary Tract: Adrenal glands are unremarkable. Kidneys are normal, without renal calculi, solid lesion, or hydronephrosis. Bladder is unremarkable. Stomach/Bowel: Stomach is within normal limits. Appendix is not clearly visualized. No evidence of bowel wall thickening, distention, or inflammatory changes. Pancolonic diverticulosis. Vascular/Lymphatic: Aortic atherosclerosis. No enlarged abdominal or pelvic lymph nodes. Reproductive: No mass or other abnormality. Other: Ventral hernia  mesh.  No abdominopelvic ascites. Musculoskeletal: Unchanged sclerotic osseous metastases of the pelvis and lumbar spine. IMPRESSION: 1. Continued interval decrease in size of a subpleural mass of the posterior superior segment left lower lobe, measuring 1.4 x 1.0 cm, previously 2.1 x 1.3 cm. Findings are consistent with treatment response. 2. Multiple low-attenuation metastatic liver lesions are unchanged. 3. Unchanged sclerotic osseous metastases of the pelvis and lumbar spine. 4. No evidence of new metastatic disease in the chest, abdomen, or pelvis. 5. Emphysema. 6. Coronary artery disease. Aortic Atherosclerosis (ICD10-I70.0) and Emphysema (ICD10-J43.9). Electronically Signed   By: ADelanna AhmadiM.D.   On: 03/03/2021 12:51   CT Abdomen Pelvis W Contrast  Result Date: 03/03/2021 CLINICAL DATA:  Metastatic left lower lobe small-cell lung cancer restaging, chemotherapy complete EXAM: CT CHEST, ABDOMEN, AND PELVIS WITH CONTRAST TECHNIQUE: Multidetector CT imaging of the chest, abdomen and pelvis was performed following the standard protocol during bolus administration of intravenous  contrast. CONTRAST:  68m OMNIPAQUE IOHEXOL 350 MG/ML SOLN, additional oral enteric contrast COMPARISON:  12/05/2020 FINDINGS: CT CHEST FINDINGS Cardiovascular: Right chest port catheter. Aortic atherosclerosis. Normal heart size. Three-vessel coronary artery calcifications. No pericardial effusion. Mediastinum/Nodes: No enlarged mediastinal, hilar, or axillary lymph nodes. Thyroid gland, trachea, and esophagus demonstrate no significant findings. Lungs/Pleura: Mild-to-moderate centrilobular and paraseptal emphysema. Continued interval decrease in size of a subpleural mass of the posterior superior segment left lower lobe, measuring 1.4 x 1.0 cm, previously 2.1 x 1.3 cm (series 506, image 77). Large bulla or pneumatocele of the right lower lobe unchanged (series 506, image 91). No pleural effusion or pneumothorax. Musculoskeletal:  No chest wall mass. CT ABDOMEN PELVIS FINDINGS Hepatobiliary: Coarse, nodular contour of the liver. Multiple low-attenuation lesions of the liver are unchanged, and an index lesion of the anterior right lobe, hepatic segment VI, measuring 1.4 x 1.2 cm (series 504, image 56), an index lesion of the peripheral liver dome, hepatic segment VII, measuring 0.6 cm (series 504, image 44). Status post cholecystectomy. No biliary ductal dilatation. Pancreas: Unremarkable. No pancreatic ductal dilatation or surrounding inflammatory changes. Spleen: Normal in size without significant abnormality. Adrenals/Urinary Tract: Adrenal glands are unremarkable. Kidneys are normal, without renal calculi, solid lesion, or hydronephrosis. Bladder is unremarkable. Stomach/Bowel: Stomach is within normal limits. Appendix is not clearly visualized. No evidence of bowel wall thickening, distention, or inflammatory changes. Pancolonic diverticulosis. Vascular/Lymphatic: Aortic atherosclerosis. No enlarged abdominal or pelvic lymph nodes. Reproductive: No mass or other abnormality. Other: Ventral hernia mesh.  No abdominopelvic ascites. Musculoskeletal: Unchanged sclerotic osseous metastases of the pelvis and lumbar spine. IMPRESSION: 1. Continued interval decrease in size of a subpleural mass of the posterior superior segment left lower lobe, measuring 1.4 x 1.0 cm, previously 2.1 x 1.3 cm. Findings are consistent with treatment response. 2. Multiple low-attenuation metastatic liver lesions are unchanged. 3. Unchanged sclerotic osseous metastases of the pelvis and lumbar spine. 4. No evidence of new metastatic disease in the chest, abdomen, or pelvis. 5. Emphysema. 6. Coronary artery disease. Aortic Atherosclerosis (ICD10-I70.0) and Emphysema (ICD10-J43.9). Electronically Signed   By: ADelanna AhmadiM.D.   On: 03/03/2021 12:51    ASSESSMENT AND PLAN: This is a very pleasant 85years old white female recently diagnosed with extensive stage (T3,  N2, M1c) small cell lung cancer presented with large left lower lobe lung mass in addition to right hilar and mediastinal lymphadenopathy as well as satellite nodule in the left lower lobe and peripheral pleural lesion in addition to metastatic liver disease diagnosed in May 2022. The patient underwent systemic chemotherapy with carboplatin for AUC of 5 on day 1, etoposide 100 Mg/M2 on days 1, 2 and 3 with Cosela before the chemotherapy every 3 weeks on day 1.  Status post 4 cycles.  She tolerated her treatment fairly well and she has good response to this treatment. The patient is currently on observation and she is feeling fine today with no concerning complaints except for the baseline shortness of breath. She had repeat CT scan of the chest, abdomen pelvis performed recently.  I personally and independently reviewed the scans and discussed the results with the patient today. HIDA scan showed no concerning findings for disease progression. I recommended for the patient to continue on observation with repeat CT scan of the chest, abdomen and pelvis in 3 months. I strongly recommend for the patient to call immediately if she has any concerning symptoms in the interval. The patient voices understanding of current disease status  and treatment options and is in agreement with the current care plan.  All questions were answered. The patient knows to call the clinic with any problems, questions or concerns. We can certainly see the patient much sooner if necessary.   Disclaimer: This note was dictated with voice recognition software. Similar sounding words can inadvertently be transcribed and may not be corrected upon review.

## 2021-03-12 ENCOUNTER — Telehealth: Payer: Self-pay | Admitting: Internal Medicine

## 2021-03-12 NOTE — Telephone Encounter (Signed)
Sch per 11/29 los, pt aware

## 2021-04-16 ENCOUNTER — Other Ambulatory Visit: Payer: Self-pay

## 2021-04-16 ENCOUNTER — Encounter: Payer: Self-pay | Admitting: Pulmonary Disease

## 2021-04-16 ENCOUNTER — Ambulatory Visit (INDEPENDENT_AMBULATORY_CARE_PROVIDER_SITE_OTHER): Payer: Medicare HMO | Admitting: Pulmonary Disease

## 2021-04-16 VITALS — BP 108/56 | HR 68 | Ht <= 58 in | Wt 151.6 lb

## 2021-04-16 DIAGNOSIS — R911 Solitary pulmonary nodule: Secondary | ICD-10-CM

## 2021-04-16 DIAGNOSIS — J439 Emphysema, unspecified: Secondary | ICD-10-CM

## 2021-04-16 DIAGNOSIS — J849 Interstitial pulmonary disease, unspecified: Secondary | ICD-10-CM

## 2021-04-16 NOTE — Patient Instructions (Addendum)
I am glad you are doing well with your breathing and your cancer is in remission Continue duo nebs and supplemental oxygen  Follow-up in 6 months

## 2021-04-16 NOTE — Progress Notes (Signed)
° °      °Dawn Gomez    [id number]    04/08/1935 ° °Primary Care Physician:John, James W, MD ° °Referring Physician: John, James W, MD °[address] °[place],  [place] [address] ° °Chief complaint: Follow-up for COPD, pulmonary fibrosis, small cell lung cancer ° °HPI: °86-year-old ex-smoker with chronic respiratory failure, emphysema, pulmonary fibrosis. She has emphysema which is managed with duo nebs and supplemental oxygen °Also has history of unspecified pulmonary fibrosis which has been stable over the years.  She is currently not on any treatment for pulmonary fibrosis ° °Diagnosed with Extensive stage (T3, N2, M1 C) small cell lung cancer after she was found to have a large left lower lobe mass with lymphadenopathy, liver lesions.  Underwent IR biopsy of liver lesions in May 2022 finish systemic chemotherapy under the care of Dr. Mohamed.  Last CT scan shows cancer is in remission ° °Pets: Has a dog  °Occupation: Homemaker °Exposures: No known exposures.  No mold, hot tub, Jacuzzi, no down pillows or comforters °Smoking history: 100-pack-year smoker.  Quit in 2008 °Travel history: No significant travel history °Relevant family history: No significant family history of lung disease ° °Interim history: °Doing well with regard to breathing.  Last CT scan shows continued interval decrease of left lower lobe mass ° °Outpatient Encounter Medications as of 04/16/2021  °Medication Sig  ° acetaminophen (TYLENOL) 500 MG tablet Take 500 mg by mouth every 6 (six) hours as needed.  ° Ascorbic Acid (VITAMIN C PO) Take by mouth.  ° Azelastine-Fluticasone 137-50 MCG/ACT SUSP Place 1 spray into the nose every 12 (twelve) hours.  ° Blood Glucose Monitoring Suppl (TRUE METRIX METER) w/Device KIT USE AS DIRECTED  ° cholecalciferol (VITAMIN D) 1000 units tablet Take 5,000 Units by mouth daily.  ° diltiazem (CARDIZEM CD) 180 MG 24 hr capsule TAKE 1 CAPSULE EVERY DAY  ° escitalopram (LEXAPRO) 20 MG tablet Take 1  tablet (20 mg total) by mouth at bedtime.  ° fenofibrate 160 MG tablet TAKE 1 TABLET EVERY DAY  ° fluocinonide (LIDEX) 0.05 % external solution APP EXT AA OF SCALP NIGHTLY  ° furosemide (LASIX) 80 MG tablet Take 1 tablet (80 mg total) by mouth daily.  ° guaiFENesin (MUCINEX) 600 MG 12 hr tablet Take 2 tablets (1,200 mg total) by mouth 2 (two) times daily as needed.  ° ipratropium-albuterol (DUONEB) 0.5-2.5 (3) MG/3ML SOLN Take 3 mLs by nebulization every 4 (four) hours as needed.  ° levothyroxine (SYNTHROID) 100 MCG tablet TAKE 1 TABLET EVERY DAY BEFORE BREAKFAST  ° lidocaine-prilocaine (EMLA) cream 30 °Squeeze  1/2 tsp on a cotton ball and apply to skin over port a cath site. Do not rub it in. Cover with plastic wrap. °Apply 1-2 hours prior to your treatment.  ° lisinopril (ZESTRIL) 5 MG tablet TAKE 1 TABLET EVERY DAY  ° potassium chloride SA (KLOR-CON) 20 MEQ tablet TAKE 1 TABLET EVERY DAY WITH FOOD  ° prochlorperazine (COMPAZINE) 10 MG tablet Take 1 tablet (10 mg total) by mouth every 6 (six) hours as needed for nausea or vomiting.  ° rosuvastatin (CRESTOR) 20 MG tablet Take 1 tablet (20 mg total) by mouth daily.  ° tiZANidine (ZANAFLEX) 2 MG tablet TAKE 1 TABLET THREE TIMES DAILY  ° traMADol (ULTRAM) 50 MG tablet TAKE 1 TABLET BY MOUTH FOUR TIMES DAILY.  ° traZODone (DESYREL) 50 MG tablet TAKE 1 TABLET (50 MG TOTAL) BY MOUTH AT BEDTIME AS NEEDED FOR SLEEP.  ° vitamin B-12 (CYANOCOBALAMIN)   1000 MCG tablet Take 500 mcg by mouth daily.  ° vitamin C (ASCORBIC ACID) 500 MG tablet Take 500 mg by mouth daily.  ° Vitamin D, Ergocalciferol, (DRISDOL) 50000 units CAPS capsule Take 1 capsule (50,000 Units total) by mouth every 7 (seven) days.  ° vitamin E 400 UNIT capsule Take 200 Units by mouth daily.  ° [DISCONTINUED] HYDROcodone-acetaminophen (NORCO/VICODIN) 5-325 MG tablet Take 1 tablet by mouth every 6 (six) hours as needed.  ° CALCIUM-MAGNESIUM-ZINC PO Take 1 tablet by mouth daily. (Patient not taking: Reported on  04/16/2021)  ° °No facility-administered encounter medications on file as of 04/16/2021.  ° °Physical Exam: °Gen:      No acute distress °HEENT:  EOMI, sclera anicteric °Neck:     No masses; no thyromegaly °Lungs:    Clear to auscultation bilaterally; normal respiratory effort °CV:         Regular rate and rhythm; no murmurs °Abd:      + bowel sounds; soft, non-tender; no palpable masses, no distension °Ext:    No edema; adequate peripheral perfusion °Skin:      Warm and dry; no rash °Neuro: alert and oriented x 3 °Psych: normal mood and affect  ° °Data Reviewed: °Imaging: °CT high-resolution 12/24/2018-fibrotic interstitial lung disease with central and peripheral involvement.  No basal gradient with no progression.  Alternate diagnosis ° °CT high-resolution 08/14/2020-large left lower lobe lung mass with satellite nodules, liver mass ° °PET scan 09/05/2020-hypermetabolic lung mass with associated vesicular nodal metastasis, liver mets ° °CT chest 12/31/2020-continued interval improvement of left lower lobe mass.  No new lesions °I have reviewed the images personally ° °PFTs: °02/09/2018 °FVC 1.71 [78%], FEV1 1.50 [93%], F/F 88, TLC 3.29 [73%], DLCO 5.72 [30%] °Mild restriction with severely reduced diffusion capacity °Stable lung function compared to 2017 ° °10/05/2020 °FVC 1.49 [72%], FEV1 1.27 [84%], F/F 86, TLC 3.20 [71%], DLCO 3.86 [23%] °Mild restriction, severe diffusion defect ° °Labs: °2009:  ANA, RF, ACE, ESR, all ok.  °Autoimmune 01/2014:  Negative °04/2015 ANA, RF, CCP, JO-1, SSA, SSB, SCL-70, Anti-centromere all negative ° °Liver biopsy 08/29/2020-small cell cancer ° °Assessment:  °Chronic respiratory failure, emphysema °Continues on nebulizer therapy supplemental oxygen °PFTs reviewed with no significant obstructive changes ° °Stage IV lung cancer °Finished chemotherapy with good response.  Continue surveillance per oncology ° °Interstitial lung disease °CT reviewed with nonspecific fibrotic changes in alternate  pattern.  This has remained stable on PFTs and CT scan over many years °Continue to observe this.  I do not believe will need aggressive work-up or treatment at this point. ° °Plan/Recommendations: °- Continue nebulizers, supplemental oxygen ° °  MD °[facility] °04/16/2021, 2:43 PM ° °CC: John, James W, MD ° ° °

## 2021-04-23 ENCOUNTER — Other Ambulatory Visit: Payer: Self-pay | Admitting: Internal Medicine

## 2021-04-23 NOTE — Telephone Encounter (Signed)
Please refill as per office routine med refill policy (all routine meds to be refilled for 3 mo or monthly (per pt preference) up to one year from last visit, then month to month grace period for 3 mo, then further med refills will have to be denied) ? ?

## 2021-05-07 ENCOUNTER — Telehealth: Payer: Self-pay | Admitting: Pulmonary Disease

## 2021-05-07 NOTE — Telephone Encounter (Signed)
Attempted to call pt but unable to reach. Left message for her to return call. 

## 2021-05-08 ENCOUNTER — Telehealth: Payer: Self-pay | Admitting: Pulmonary Disease

## 2021-05-09 NOTE — Telephone Encounter (Signed)
Pt is returning call again. Pt states she doesnt do voicemail and for [place] to text. Her MyChart is not active, so I informed her that we do not text and voicemail is how we contact pts when they dont answer. Advised her that all nurses are working with providers and making return calls as quickly as possible and that she needs to answer the call if she doesn't do voicemail.

## 2021-05-09 NOTE — Telephone Encounter (Signed)
There is another encounter open already. I am going to close this encounter. Please refer to encounter from 05/07/21.

## 2021-05-09 NOTE — Telephone Encounter (Signed)
ATC patient, LMTCB her line rings once and then goes straight to voicemail

## 2021-05-10 ENCOUNTER — Ambulatory Visit: Payer: Medicare HMO | Admitting: Internal Medicine

## 2021-05-14 ENCOUNTER — Other Ambulatory Visit: Payer: Self-pay | Admitting: Internal Medicine

## 2021-05-14 NOTE — Telephone Encounter (Signed)
ATC patient line rang once and went to straight to voicemail   When patient calls back please have someone talk to her if possible since we have been playing phone tag for 7 days

## 2021-05-16 ENCOUNTER — Ambulatory Visit: Payer: Medicare HMO | Admitting: Internal Medicine

## 2021-05-16 NOTE — Telephone Encounter (Signed)
Called the pt and there was no answer- LMTCB and will close per protocol.

## 2021-05-24 ENCOUNTER — Ambulatory Visit: Payer: Medicare HMO | Admitting: Internal Medicine

## 2021-05-25 ENCOUNTER — Encounter: Payer: Self-pay | Admitting: *Deleted

## 2021-05-25 NOTE — Progress Notes (Signed)
Oncology Nurse Navigator Documentation  Oncology Nurse Navigator Flowsheets 05/25/2021 03/06/2021 09/20/2020 08/31/2020  Abnormal Finding Date - - - 08/14/2020  Confirmed Diagnosis Date - - - 08/29/2020  Diagnosis Status - - - Confirmed Diagnosis Complete  Planned Course of Treatment - - - Chemotherapy  Phase of Treatment Chemo - Chemo Chemo  Chemotherapy Actual Start Date: 09/20/2020 - 09/20/2020 -  Chemotherapy Actual End Date: 01/01/2021 - - -  Navigator Follow Up Date: - - 10/04/2020 09/05/2020  Navigator Follow Up Reason: - - Follow-up Appointment Appointment Review  Navigation Complete Date: 05/25/2021 - - -  Post Navigation: Continue to Follow Patient? No - - -  Reason Not Navigating Patient: No Treatment, Observation Only - - -  Navigator Location [facility]  Referral Date to RadOnc/MedOnc - - - 08/23/2020  Navigator Encounter Type Other: Clinic/MDC Treatment Clinic/MDC;Initial MedOnc  Treatment Initiated Date - - 09/20/2020 -  Patient Visit Type Other Follow-up;MedOnc Other MedOnc;Initial  Treatment Phase Other Follow-up Treatment Pre-Tx/Tx Discussion  Barriers/Navigation Needs Coordination of Care Education - Education  Education - Other - Newly Diagnosed Cancer Education;Other  Interventions Coordination of Care Education;Psycho-Social Support Psycho-Social Support Education;Psycho-Social Support  Acuity Level 2-Minimal Needs (1-2 Barriers Identified) Level 2-Minimal Needs (1-2 Barriers Identified) Level 2-Minimal Needs (1-2 Barriers Identified) Level 2-Minimal Needs (1-2 Barriers Identified)  Coordination of Care Other - - -  Education Method - Verbal - Verbal;Written  Time Spent with Patient 15 15 15  60

## 2021-05-30 ENCOUNTER — Other Ambulatory Visit: Payer: Self-pay

## 2021-05-30 ENCOUNTER — Ambulatory Visit (INDEPENDENT_AMBULATORY_CARE_PROVIDER_SITE_OTHER): Payer: Medicare HMO | Admitting: Internal Medicine

## 2021-05-30 ENCOUNTER — Encounter: Payer: Self-pay | Admitting: Internal Medicine

## 2021-05-30 VITALS — BP 132/74 | HR 56 | Resp 18 | Ht <= 58 in | Wt 153.6 lb

## 2021-05-30 DIAGNOSIS — R739 Hyperglycemia, unspecified: Secondary | ICD-10-CM | POA: Diagnosis not present

## 2021-05-30 DIAGNOSIS — E538 Deficiency of other specified B group vitamins: Secondary | ICD-10-CM | POA: Diagnosis not present

## 2021-05-30 DIAGNOSIS — E039 Hypothyroidism, unspecified: Secondary | ICD-10-CM | POA: Diagnosis not present

## 2021-05-30 DIAGNOSIS — J9611 Chronic respiratory failure with hypoxia: Secondary | ICD-10-CM

## 2021-05-30 DIAGNOSIS — R1031 Right lower quadrant pain: Secondary | ICD-10-CM

## 2021-05-30 DIAGNOSIS — M25561 Pain in right knee: Secondary | ICD-10-CM

## 2021-05-30 DIAGNOSIS — M5412 Radiculopathy, cervical region: Secondary | ICD-10-CM | POA: Diagnosis not present

## 2021-05-30 DIAGNOSIS — D649 Anemia, unspecified: Secondary | ICD-10-CM | POA: Diagnosis not present

## 2021-05-30 DIAGNOSIS — E559 Vitamin D deficiency, unspecified: Secondary | ICD-10-CM | POA: Diagnosis not present

## 2021-05-30 DIAGNOSIS — Z0001 Encounter for general adult medical examination with abnormal findings: Secondary | ICD-10-CM | POA: Diagnosis not present

## 2021-05-30 DIAGNOSIS — I700 Atherosclerosis of aorta: Secondary | ICD-10-CM

## 2021-05-30 DIAGNOSIS — M109 Gout, unspecified: Secondary | ICD-10-CM

## 2021-05-30 DIAGNOSIS — E782 Mixed hyperlipidemia: Secondary | ICD-10-CM

## 2021-05-30 MED ORDER — PREDNISONE 10 MG PO TABS: ORAL_TABLET | ORAL | 0 refills | Status: DC

## 2021-05-30 MED ORDER — METHYLPREDNISOLONE ACETATE 80 MG/ML IJ SUSP
80.0000 mg | Freq: Once | INTRAMUSCULAR | Status: AC
  Administered 2021-05-30: 80 mg via INTRAMUSCULAR

## 2021-05-30 NOTE — Assessment & Plan Note (Signed)
Lab Results  Component Value Date   HGBA1C 5.4 08/17/2020   Stable, pt to continue current medical treatment - diet

## 2021-05-30 NOTE — Assessment & Plan Note (Signed)
Lab Results  Component Value Date   LDLCALC 44 08/17/2020   Stable, pt to continue current statin crestor

## 2021-05-30 NOTE — Assessment & Plan Note (Signed)
Pt to continue low chol diet, exercise, crestor

## 2021-05-30 NOTE — Assessment & Plan Note (Signed)
Last vitamin D Lab Results  Component Value Date   VD25OH 53.31 08/17/2020   Stable, cont oral replacement

## 2021-05-30 NOTE — Assessment & Plan Note (Signed)
Lab Results  Component Value Date   TSH 1.05 08/17/2020   Stable, pt to continue levothyroxine

## 2021-05-30 NOTE — Patient Instructions (Addendum)
You had the steroid shot today  Please take all new medication as prescribed - the prednisone  Please continue all other medications as before, including the tramadol  Please have the pharmacy call with any other refills you may need.  Please continue your efforts at being more active, low cholesterol diet, and weight control.  You are otherwise up to date with prevention measures today.  Please keep your appointments with your specialists as you may have planned - the CT scan in a few days  You will be contacted regarding the referral for: Velva for the neck and the knee  Please go to the LAB at the blood drawing area for the tests to be done  You will be contacted by phone if any changes need to be made immediately.  Otherwise, you will receive a letter about your results with an explanation, but please check with MyChart first.  Please remember to sign up for MyChart if you have not done so, as this will be important to you in the future with finding out test results, communicating by private email, and scheduling acute appointments online when needed.  Please make an Appointment to return in 3 months, or sooner if needed  We will cancel the Mar 13 appt since you are here today

## 2021-05-30 NOTE — Progress Notes (Signed)
Patient ID: Dawn Gomez, female   DOB: February 18, 1936, 86 y.o.   MRN: [id number]         Chief Complaint:: wellness exam and Abdominal Pain (She started having abdominal pain after she finished chemotherapy which is also when her back pain started. She would also like a referral to have a colonoscopy done. )         HPI:  Dawn Gomez is a 86 y.o. female here for wellness exam; declines covid booster, shingrix, dxa for now o/w up to date                        Also remains on home o2 2.5 L continuous.  Pt denies chest pain, increased sob or doe, wheezing, orthopnea, PND, increased LE swelling, palpitations, dizziness or syncope.  Denies worsening reflux, dysphagia, n/v, bowel change or blood, but does have > 2 mo onset RLQ pain dull mild intermittent without radiation, fever, and Denies urinary symptoms such as dysuria, frequency, urgency, flank pain, hematuria or n/v, fever, chills.  Denies worsening reflux, dysphagia, n/v, bowel change or blood. Incidentally has f/u CT planned for feb 24, results pending at this time.  Also has 1 wk onset bilateral post neck pain, sharp and tingling with random intermittent pains to one, then the other side to the shoulders, but no other UE pain, weakness.  Also incidentally with right kne pain with effusion with increased walk and standing recent, no falls but states several times she has wanted to try to fall, though always to catch herself.  Has hx of gout.  Also no recent overt bleeding or bruising.      Wt Readings from Last 3 Encounters:  05/30/21 153 lb 9.6 oz (69.7 kg)  04/16/21 151 lb 9.6 oz (68.8 kg)  03/06/21 148 lb 12.8 oz (67.5 kg)   BP Readings from Last 3 Encounters:  05/30/21 132/74  04/16/21 (!) 108/56  03/06/21 (!) 115/57   Immunization History  Administered Date(s) Administered   Fluad Quad(high Dose 65+) 02/01/2019, 12/17/2019, 12/18/2020   Influenza Whole 02/06/2009, 11/06/2009, 01/29/2012   Influenza, High Dose Seasonal PF  02/05/2017, 02/09/2018   Influenza,inj,Quad PF,6+ Mos 02/08/2013, 01/28/2014   Influenza-Unspecified 03/09/2015, 12/08/2015   Pneumococcal Conjugate-13 10/06/2018   Pneumococcal Polysaccharide-23 02/06/2009, 09/26/2017   Pneumococcal-Unspecified 04/08/2014   Td 08/06/2012   There are no preventive care reminders to display for this patient.     Past Medical History:  Diagnosis Date   Abnormal heart rhythm    Aortic aneurysm (HCC)    Aortic atherosclerosis ([place]) 03/03/2019   Cancer (HCC)    Coronary artery calcification seen on CT scan 12/18/2020   Dyspnea    Hypothyroid    OSA (obstructive sleep apnea)    on cpap   Psoriasis 02/05/2017   Pulmonary fibrosis (HCC)    SVT (supraventricular tachycardia) (HCC)    Thyroid disease    hypo   Past Surgical History:  Procedure Laterality Date   ABDOMINAL HYSTERECTOMY     APPENDECTOMY     CHOLECYSTECTOMY     CRANIOTOMY     for aneurysms   CRANIOTOMY  1980 x 2   aneurysmal clipping   HERNIA REPAIR     IR IMAGING GUIDED PORT INSERTION  09/15/2020   TOTAL ABDOMINAL HYSTERECTOMY     TUBAL LIGATION      reports that she quit smoking about 15 years ago. Her smoking use included cigarettes. She has a 100.00 pack-year smoking  history. She has never used smokeless tobacco. She reports current alcohol use. She reports that she does not use drugs. family history includes Allergies in her sister; Bone cancer in her father; Breast cancer in her sister; Cancer in her sister; Clotting disorder in her father and sister; Emphysema in her father, mother, and sister; Heart disease in her father and mother; Lung cancer in her father and mother; Prostate cancer in her father; Rheum arthritis in her father, mother, and sister. Allergies  Allergen Reactions   Lipitor [Atorvastatin] Shortness Of Breath, Diarrhea and Other (See Comments)    Dizzy, pain all over.   Codeine    Hydromorphone Hcl     REACTION: dementia   Levaquin [Levofloxacin In D5w]      Shock per pt but can take cipro   Morphine    Oxycodone-Acetaminophen    Propoxyphene N-Acetaminophen    Simvastatin    Current Outpatient Medications on File Prior to Visit  Medication Sig Dispense Refill   acetaminophen (TYLENOL) 500 MG tablet Take 500 mg by mouth every 6 (six) hours as needed.     Ascorbic Acid (VITAMIN C PO) Take by mouth.     Azelastine-Fluticasone 137-50 MCG/ACT SUSP Place 1 spray into the nose every 12 (twelve) hours. 23 g 5   Blood Glucose Monitoring Suppl (TRUE METRIX METER) w/Device KIT USE AS DIRECTED 1 kit 0   cholecalciferol (VITAMIN D) 1000 units tablet Take 5,000 Units by mouth daily.     diltiazem (CARDIZEM CD) 180 MG 24 hr capsule TAKE 1 CAPSULE EVERY DAY 90 capsule 1   escitalopram (LEXAPRO) 20 MG tablet TAKE 1 TABLET AT BEDTIME 90 tablet 2   fenofibrate 160 MG tablet TAKE 1 TABLET EVERY DAY 90 tablet 1   fluocinonide (LIDEX) 0.05 % external solution APP EXT AA OF SCALP NIGHTLY  3   furosemide (LASIX) 80 MG tablet TAKE 1 TABLET EVERY DAY 90 tablet 1   glucose blood (TRUE METRIX BLOOD GLUCOSE TEST) test strip TEST  UP  TO FOUR TIMES DAILY AS DIRECTED 100 strip 3   guaiFENesin (MUCINEX) 600 MG 12 hr tablet Take 2 tablets (1,200 mg total) by mouth 2 (two) times daily as needed. 60 tablet 2   ipratropium-albuterol (DUONEB) 0.5-2.5 (3) MG/3ML SOLN Take 3 mLs by nebulization every 4 (four) hours as needed. 360 mL 0   levothyroxine (SYNTHROID) 100 MCG tablet TAKE 1 TABLET EVERY DAY BEFORE BREAKFAST 90 tablet 2   lidocaine-prilocaine (EMLA) cream 30 Squeeze  1/2 tsp on a cotton ball and apply to skin over port a cath site. Do not rub it in. Cover with plastic wrap. Apply 1-2 hours prior to your treatment. 30 g 0   lisinopril (ZESTRIL) 5 MG tablet TAKE 1 TABLET EVERY DAY 90 tablet 3   potassium chloride SA (KLOR-CON M) 20 MEQ tablet TAKE 1 TABLET EVERY DAY WITH FOOD 90 tablet 1   prochlorperazine (COMPAZINE) 10 MG tablet Take 1 tablet (10 mg total) by mouth every  6 (six) hours as needed for nausea or vomiting. 30 tablet 0   rosuvastatin (CRESTOR) 20 MG tablet TAKE 1 TABLET EVERY DAY 90 tablet 2   tiZANidine (ZANAFLEX) 2 MG tablet TAKE 1 TABLET THREE TIMES DAILY 270 tablet 1   traMADol (ULTRAM) 50 MG tablet TAKE 1 TABLET BY MOUTH FOUR TIMES DAILY. 360 tablet 1   traZODone (DESYREL) 50 MG tablet TAKE 1 TABLET (50 MG TOTAL) BY MOUTH AT BEDTIME AS NEEDED FOR SLEEP. 90 tablet 1  TRUEplus Lancets 33G MISC TEST  UP  TO FOUR TIMES DAILY AS DIRECTED 100 each 3   vitamin B-12 (CYANOCOBALAMIN) 1000 MCG tablet Take 500 mcg by mouth daily.     vitamin C (ASCORBIC ACID) 500 MG tablet Take 500 mg by mouth daily.     Vitamin D, Ergocalciferol, (DRISDOL) 50000 units CAPS capsule Take 1 capsule (50,000 Units total) by mouth every 7 (seven) days. 12 capsule 0   vitamin E 400 UNIT capsule Take 200 Units by mouth daily.     CALCIUM-MAGNESIUM-ZINC PO Take 1 tablet by mouth daily. (Patient not taking: Reported on 04/16/2021)     No current facility-administered medications on file prior to visit.        ROS:  All others reviewed and negative.  Objective        PE:  BP 132/74    Pulse (!) 56    Resp 18    Ht 4' 10"  (1.473 m)    Wt 153 lb 9.6 oz (69.7 kg)    SpO2 96%    BMI 32.10 kg/m                 Constitutional: Pt appears in NAD               HENT: Head: NCAT.                Right Ear: External ear normal.                 Left Ear: External ear normal.                Eyes: . Pupils are equal, round, and reactive to light. Conjunctivae and EOM are normal               Nose: without d/c or deformity               Neck: Neck supple. Gross normal ROM               Cardiovascular: Normal rate and regular rhythm.                 Pulmonary/Chest: Effort normal and breath sounds without rales or wheezing.                Abd:  Soft, NT, ND, + BS, no organomegaly               Neurological: Pt is alert. At baseline orientation, motor grossly intact               Skin: Skin  is warm. No rashes, no other new lesions, LE edema - none; right knee with 1+ effusion and reduced ROM, mod tender               Psychiatric: Pt behavior is normal without agitation   Micro: none  Cardiac tracings I have personally interpreted today:  none  Pertinent Radiological findings (summarize): none   Lab Results  Component Value Date   WBC 4.4 06/01/2021   HGB 10.5 (L) 06/01/2021   HCT 32.0 (L) 06/01/2021   PLT 209 06/01/2021   GLUCOSE 89 06/01/2021   CHOL 115 08/17/2020   TRIG 102.0 08/17/2020   HDL 49.80 08/17/2020   LDLDIRECT 138.0 02/05/2017   LDLCALC 44 08/17/2020   ALT 13 06/01/2021   AST 18 06/01/2021   NA 140 06/01/2021   K 4.0 06/01/2021   CL 101 06/01/2021   CREATININE 0.79 06/01/2021  BUN 20 06/01/2021   CO2 33 (H) 06/01/2021   TSH 1.05 08/17/2020   INR 1.1 08/29/2020   HGBA1C 5.4 08/17/2020   Assessment/Plan:  Dawn Gomez is a 86 y.o. White or Caucasian [1] female with  has a past medical history of Abnormal heart rhythm, Aortic aneurysm ([place]), Aortic atherosclerosis ([place]) (03/03/2019), Cancer [facility]), Coronary artery calcification seen on CT scan (12/18/2020), Dyspnea, Hypothyroid, OSA (obstructive sleep apnea), Psoriasis (02/05/2017), Pulmonary fibrosis ([place]), SVT (supraventricular tachycardia) ([place]), and Thyroid disease.  Vitamin D deficiency Last vitamin D Lab Results  Component Value Date   VD25OH 53.31 08/17/2020   Stable, cont oral replacement   Hypothyroidism Lab Results  Component Value Date   TSH 1.05 08/17/2020   Stable, pt to continue levothyroxine   Hyperglycemia Lab Results  Component Value Date   HGBA1C 5.4 08/17/2020   Stable, pt to continue current medical treatment - diet   Hyperlipidemia Lab Results  Component Value Date   LDLCALC 44 08/17/2020   Stable, pt to continue current statin crestor   Aortic atherosclerosis (HCC) Pt to continue low chol diet, exercise, crestor  Chronic respiratory failure  ([place]) Now chroinc stable on 2.5 L Home o2  Encounter for well adult exam with abnormal findings Age and sex appropriate education and counseling updated with regular exercise and diet Referrals for preventative services - declines dxa for now Immunizations addressed - decliens covid booster, shingirx Smoking counseling  - none needed Evidence for depression or other mood disorder - none significant Most recent labs reviewed. I have personally reviewed and have noted: 1) the patient's medical and social history 2) The patient's current medications and supplements 3) The patient's height, weight, and BMI have been recorded in the chart   Cervical radiculitis Mild intermittent without worsening radicular symptoms or neuro change, for prednisone and pain control, to f/u any worsening symptoms or concerns   Right knee pain Some suspicous for gout vs djd- for depomedrol IM80,  to f/u any worsening symptoms or concerns, refer ortho  RLQ abdominal pain Exam benign, chroinc, etiology unclear, for UA with labs and f/u CT planned for feb 24  Anemia Also for iron lab  Followup: Return in about 3 months (around 08/27/2021).  Cathlean Cower, MD 06/02/2021 9:03 PM [facility]

## 2021-06-01 ENCOUNTER — Ambulatory Visit (HOSPITAL_COMMUNITY)
Admission: RE | Admit: 2021-06-01 | Discharge: 2021-06-01 | Disposition: A | Discharge status: Home / Self Care | Payer: Medicare HMO | Source: Ambulatory Visit | Attending: Internal Medicine | Admitting: Internal Medicine

## 2021-06-01 ENCOUNTER — Inpatient Hospital Stay: Payer: Medicare HMO

## 2021-06-01 ENCOUNTER — Telehealth: Payer: Self-pay

## 2021-06-01 ENCOUNTER — Other Ambulatory Visit: Payer: Self-pay

## 2021-06-01 ENCOUNTER — Inpatient Hospital Stay: Payer: Medicare HMO | Attending: Internal Medicine

## 2021-06-01 DIAGNOSIS — C3490 Malignant neoplasm of unspecified part of unspecified bronchus or lung: Secondary | ICD-10-CM | POA: Insufficient documentation

## 2021-06-01 DIAGNOSIS — J439 Emphysema, unspecified: Secondary | ICD-10-CM | POA: Diagnosis not present

## 2021-06-01 DIAGNOSIS — R911 Solitary pulmonary nodule: Secondary | ICD-10-CM | POA: Diagnosis not present

## 2021-06-01 DIAGNOSIS — Z95828 Presence of other vascular implants and grafts: Secondary | ICD-10-CM

## 2021-06-01 DIAGNOSIS — I700 Atherosclerosis of aorta: Secondary | ICD-10-CM | POA: Diagnosis not present

## 2021-06-01 DIAGNOSIS — K769 Liver disease, unspecified: Secondary | ICD-10-CM | POA: Diagnosis not present

## 2021-06-01 LAB — CMP (CANCER CENTER ONLY)
ALT: 13 U/L (ref 0–44)
AST: 18 U/L (ref 15–41)
Albumin: 4.1 g/dL (ref 3.5–5.0)
Alkaline Phosphatase: 39 U/L (ref 38–126)
Anion gap: 6 (ref 5–15)
BUN: 20 mg/dL (ref 8–23)
CO2: 33 mmol/L — ABNORMAL HIGH (ref 22–32)
Calcium: 10.1 mg/dL (ref 8.9–10.3)
Chloride: 101 mmol/L (ref 98–111)
Creatinine: 0.79 mg/dL (ref 0.44–1.00)
GFR, Estimated: >60 mL/min (ref >60)
Glucose, Bld: 89 mg/dL (ref 70–99)
Potassium: 4 mmol/L (ref 3.5–5.1)
Sodium: 140 mmol/L (ref 135–145)
Total Bilirubin: 0.4 mg/dL (ref 0.3–1.2)
Total Protein: 6.7 g/dL (ref 6.5–8.1)

## 2021-06-01 LAB — CBC WITH DIFFERENTIAL (CANCER CENTER ONLY)
Abs Immature Granulocytes: 0.01 10*3/uL (ref 0.00–0.07)
Basophils Absolute: 0 10*3/uL (ref 0.0–0.1)
Basophils Relative: 1 %
Eosinophils Absolute: 0.1 10*3/uL (ref 0.0–0.5)
Eosinophils Relative: 3 %
HCT: 32 % — ABNORMAL LOW (ref 36.0–46.0)
Hemoglobin: 10.5 g/dL — ABNORMAL LOW (ref 12.0–15.0)
Immature Granulocytes: 0 %
Lymphocytes Relative: 23 %
Lymphs Abs: 1 10*3/uL (ref 0.7–4.0)
MCH: 30.6 pg (ref 26.0–34.0)
MCHC: 32.8 g/dL (ref 30.0–36.0)
MCV: 93.3 fL (ref 80.0–100.0)
Monocytes Absolute: 0.4 10*3/uL (ref 0.1–1.0)
Monocytes Relative: 10 %
Neutro Abs: 2.8 10*3/uL (ref 1.7–7.7)
Neutrophils Relative %: 63 %
Platelet Count: 209 10*3/uL (ref 150–400)
RBC: 3.43 MIL/uL — ABNORMAL LOW (ref 3.87–5.11)
RDW: 14 % (ref 11.5–15.5)
WBC Count: 4.4 10*3/uL (ref 4.0–10.5)
nRBC: 0 % (ref 0.0–0.2)

## 2021-06-01 MED ORDER — SODIUM CHLORIDE (PF) 0.9 % IJ SOLN
INTRAMUSCULAR | Status: AC
  Filled 2021-06-01: qty 50

## 2021-06-01 MED ORDER — HEPARIN SOD (PORK) LOCK FLUSH 100 UNIT/ML IV SOLN
INTRAVENOUS | Status: AC
  Filled 2021-06-01: qty 5

## 2021-06-01 MED ORDER — IOHEXOL 300 MG/ML  SOLN
100.0000 mL | Freq: Once | INTRAMUSCULAR | Status: AC | PRN
  Administered 2021-06-01: 100 mL via INTRAVENOUS

## 2021-06-01 MED ORDER — SODIUM CHLORIDE 0.9% FLUSH
10.0000 mL | Freq: Once | INTRAVENOUS | Status: AC
  Administered 2021-06-01: 10 mL

## 2021-06-01 MED ORDER — HEPARIN SOD (PORK) LOCK FLUSH 100 UNIT/ML IV SOLN
500.0000 [IU] | Freq: Once | INTRAVENOUS | Status: AC
  Administered 2021-06-01: 500 [IU] via INTRAVENOUS

## 2021-06-01 NOTE — Telephone Encounter (Signed)
Received message from [employer] advising that patient had not called CS back to schedule CT scan. I spoke with patient and provided number to CS. She will call this AM to schedule.

## 2021-06-02 ENCOUNTER — Encounter: Payer: Self-pay | Admitting: Internal Medicine

## 2021-06-02 DIAGNOSIS — D649 Anemia, unspecified: Secondary | ICD-10-CM | POA: Insufficient documentation

## 2021-06-02 NOTE — Assessment & Plan Note (Addendum)
Some suspicous for gout vs djd- for depomedrol IM80,  to f/u any worsening symptoms or concerns, refer ortho

## 2021-06-02 NOTE — Assessment & Plan Note (Signed)
Exam benign, chroinc, etiology unclear, for UA with labs and f/u CT planned for feb 24

## 2021-06-02 NOTE — Assessment & Plan Note (Signed)
Age and sex appropriate education and counseling updated with regular exercise and diet Referrals for preventative services - declines dxa for now Immunizations addressed - decliens covid booster, shingirx Smoking counseling  - none needed Evidence for depression or other mood disorder - none significant Most recent labs reviewed. I have personally reviewed and have noted: 1) the patient's medical and social history 2) The patient's current medications and supplements 3) The patient's height, weight, and BMI have been recorded in the chart

## 2021-06-02 NOTE — Assessment & Plan Note (Signed)
Mild intermittent without worsening radicular symptoms or neuro change, for prednisone and pain control, to f/u any worsening symptoms or concerns

## 2021-06-02 NOTE — Assessment & Plan Note (Signed)
Also for iron lab

## 2021-06-02 NOTE — Assessment & Plan Note (Signed)
Now chroinc stable on 2.5 L Home o2

## 2021-06-05 ENCOUNTER — Inpatient Hospital Stay: Payer: Medicare HMO | Admitting: Internal Medicine

## 2021-06-05 ENCOUNTER — Other Ambulatory Visit: Payer: Self-pay

## 2021-06-05 VITALS — BP 119/51 | HR 62 | Temp 97.8°F | Resp 19 | Ht <= 58 in | Wt 151.6 lb

## 2021-06-05 DIAGNOSIS — Z5111 Encounter for antineoplastic chemotherapy: Secondary | ICD-10-CM

## 2021-06-05 DIAGNOSIS — L409 Psoriasis, unspecified: Secondary | ICD-10-CM | POA: Insufficient documentation

## 2021-06-05 DIAGNOSIS — C787 Secondary malignant neoplasm of liver and intrahepatic bile duct: Secondary | ICD-10-CM | POA: Insufficient documentation

## 2021-06-05 DIAGNOSIS — C3432 Malignant neoplasm of lower lobe, left bronchus or lung: Secondary | ICD-10-CM | POA: Insufficient documentation

## 2021-06-05 NOTE — Progress Notes (Signed)
[facility] Telephone:([phone]) [phone]   Fax:([phone]) [phone]  OFFICE PROGRESS NOTE  Biagio Borg, MD [place] [id number]  DIAGNOSIS: Extensive stage (T3, N2, M1 C) small cell lung cancer presented with large left lower lobe lung mass in addition to right hilar and mediastinal lymphadenopathy as well as satellite nodule in the left lower lobe and peripheral pleural lesion as well as metastatic liver lesions diagnosed in May 2022.  PRIOR THERAPY: Systemic chemotherapy with carboplatin for AUC of 5 from day 1, etoposide 100 Mg/M2 on days 1, 2 and 3 with Cosela before the chemotherapy every 3 weeks.  Status post 4 cycles.  First cycle started September 18, 2020.  Status post 4 cycles.  The patient did not receive immunotherapy because of the extensive psoriasis.  Last dose of treatment was given on November 20, 2020  CURRENT THERAPY: Second line systemic chemotherapy with carboplatin for AUC of 5 on day 1 and etoposide 100 Mg/M2 on days 1, 2 and 3 with Cosela 240 Mg/M2 before chemotherapy every 3 weeks.  First dose June 12, 2021..  INTERVAL HISTORY: Dawn Gomez 86 y.o. female returns to the clinic today for follow-up visit.  The patient is feeling fine today with no concerning complaints except for the baseline shortness of breath and she is currently on home oxygen.  She denied having any current chest pain, cough or hemoptysis.  She has no nausea, vomiting, diarrhea or constipation.  She denied having any headache or visual changes.  She has no weight loss or night sweats.  She has been in observation for the last 6 months.  She is here today for evaluation with repeat CT scan of the chest, abdomen and pelvis for restaging of her disease.   MEDICAL HISTORY: Past Medical History:  Diagnosis Date   Abnormal heart rhythm    Aortic aneurysm (HCC)    Aortic atherosclerosis ([place]) 03/03/2019   Cancer ([place])    Coronary artery calcification seen on CT scan  12/18/2020   Dyspnea    Hypothyroid    OSA (obstructive sleep apnea)    on cpap   Psoriasis 02/05/2017   Pulmonary fibrosis (HCC)    SVT (supraventricular tachycardia) (HCC)    Thyroid disease    hypo    ALLERGIES:  is allergic to lipitor [atorvastatin], codeine, hydromorphone hcl, levaquin [levofloxacin in d5w], morphine, oxycodone-acetaminophen, propoxyphene n-acetaminophen, and simvastatin.  MEDICATIONS:  Current Outpatient Medications  Medication Sig Dispense Refill   acetaminophen (TYLENOL) 500 MG tablet Take 500 mg by mouth every 6 (six) hours as needed.     Ascorbic Acid (VITAMIN C PO) Take by mouth.     Azelastine-Fluticasone 137-50 MCG/ACT SUSP Place 1 spray into the nose every 12 (twelve) hours. 23 g 5   Blood Glucose Monitoring Suppl (TRUE METRIX METER) w/Device KIT USE AS DIRECTED 1 kit 0   CALCIUM-MAGNESIUM-ZINC PO Take 1 tablet by mouth daily. (Patient not taking: Reported on 04/16/2021)     cholecalciferol (VITAMIN D) 1000 units tablet Take 5,000 Units by mouth daily.     diltiazem (CARDIZEM CD) 180 MG 24 hr capsule TAKE 1 CAPSULE EVERY DAY 90 capsule 1   escitalopram (LEXAPRO) 20 MG tablet TAKE 1 TABLET AT BEDTIME 90 tablet 2   fenofibrate 160 MG tablet TAKE 1 TABLET EVERY DAY 90 tablet 1   fluocinonide (LIDEX) 0.05 % external solution APP EXT AA OF SCALP NIGHTLY  3   furosemide (LASIX) 80 MG  tablet TAKE 1 TABLET EVERY DAY 90 tablet 1   glucose blood (TRUE METRIX BLOOD GLUCOSE TEST) test strip TEST  UP  TO FOUR TIMES DAILY AS DIRECTED 100 strip 3   guaiFENesin (MUCINEX) 600 MG 12 hr tablet Take 2 tablets (1,200 mg total) by mouth 2 (two) times daily as needed. 60 tablet 2   ipratropium-albuterol (DUONEB) 0.5-2.5 (3) MG/3ML SOLN Take 3 mLs by nebulization every 4 (four) hours as needed. 360 mL 0   levothyroxine (SYNTHROID) 100 MCG tablet TAKE 1 TABLET EVERY DAY BEFORE BREAKFAST 90 tablet 2   lidocaine-prilocaine (EMLA) cream 30 Squeeze  1/2 tsp on a cotton ball and apply  to skin over port a cath site. Do not rub it in. Cover with plastic wrap. Apply 1-2 hours prior to your treatment. 30 g 0   lisinopril (ZESTRIL) 5 MG tablet TAKE 1 TABLET EVERY DAY 90 tablet 3   potassium chloride SA (KLOR-CON M) 20 MEQ tablet TAKE 1 TABLET EVERY DAY WITH FOOD 90 tablet 1   predniSONE (DELTASONE) 10 MG tablet 3 tabs by mouth per day for 3 days,2tabs per day for 3 days,1tab per day for 3 days 18 tablet 0   prochlorperazine (COMPAZINE) 10 MG tablet Take 1 tablet (10 mg total) by mouth every 6 (six) hours as needed for nausea or vomiting. 30 tablet 0   rosuvastatin (CRESTOR) 20 MG tablet TAKE 1 TABLET EVERY DAY 90 tablet 2   tiZANidine (ZANAFLEX) 2 MG tablet TAKE 1 TABLET THREE TIMES DAILY 270 tablet 1   traMADol (ULTRAM) 50 MG tablet TAKE 1 TABLET BY MOUTH FOUR TIMES DAILY. 360 tablet 1   traZODone (DESYREL) 50 MG tablet TAKE 1 TABLET (50 MG TOTAL) BY MOUTH AT BEDTIME AS NEEDED FOR SLEEP. 90 tablet 1   TRUEplus Lancets 33G MISC TEST  UP  TO FOUR TIMES DAILY AS DIRECTED 100 each 3   vitamin B-12 (CYANOCOBALAMIN) 1000 MCG tablet Take 500 mcg by mouth daily.     vitamin C (ASCORBIC ACID) 500 MG tablet Take 500 mg by mouth daily.     Vitamin D, Ergocalciferol, (DRISDOL) 50000 units CAPS capsule Take 1 capsule (50,000 Units total) by mouth every 7 (seven) days. 12 capsule 0   vitamin E 400 UNIT capsule Take 200 Units by mouth daily.     No current facility-administered medications for this visit.    SURGICAL HISTORY:  Past Surgical History:  Procedure Laterality Date   ABDOMINAL HYSTERECTOMY     APPENDECTOMY     CHOLECYSTECTOMY     CRANIOTOMY     for aneurysms   CRANIOTOMY  1980 x 2   aneurysmal clipping   HERNIA REPAIR     IR IMAGING GUIDED PORT INSERTION  09/15/2020   TOTAL ABDOMINAL HYSTERECTOMY     TUBAL LIGATION      REVIEW OF SYSTEMS:  Constitutional: positive for fatigue Eyes: negative Ears, nose, mouth, throat, and face: negative Respiratory: positive for  dyspnea on exertion Cardiovascular: negative Gastrointestinal: negative Genitourinary:negative Integument/breast: negative Hematologic/lymphatic: negative Musculoskeletal:positive for arthralgias Neurological: negative Behavioral/Psych: negative Endocrine: negative Allergic/Immunologic: negative   PHYSICAL EXAMINATION: General appearance: alert, cooperative, appears older than stated age, fatigued, and no distress Head: Normocephalic, without obvious abnormality, atraumatic Neck: no adenopathy, no JVD, supple, symmetrical, trachea midline, and thyroid not enlarged, symmetric, no tenderness/mass/nodules Lymph nodes: Cervical, supraclavicular, and axillary nodes normal. Resp: clear to auscultation bilaterally Back: symmetric, no curvature. ROM normal. No CVA tenderness. Cardio: regular rate and rhythm, S1, S2 normal,  no murmur, click, rub or gallop GI: soft, non-tender; bowel sounds normal; no masses,  no organomegaly Extremities: extremities normal, atraumatic, no cyanosis or edema Neurologic: Alert and oriented X 3, normal strength and tone. Normal symmetric reflexes. Normal coordination and gait  ECOG PERFORMANCE STATUS: 1 - Symptomatic but completely ambulatory  Blood pressure (!) 119/51, pulse 62, temperature 97.8 F (36.6 C), temperature source Tympanic, resp. rate 19, height 4' 10"  (1.473 m), weight 151 lb 9.6 oz (68.8 kg), SpO2 92 %.  LABORATORY DATA: Lab Results  Component Value Date   WBC 4.4 06/01/2021   HGB 10.5 (L) 06/01/2021   HCT 32.0 (L) 06/01/2021   MCV 93.3 06/01/2021   PLT 209 06/01/2021      Chemistry      Component Value Date/Time   NA 140 06/01/2021 1534   K 4.0 06/01/2021 1534   CL 101 06/01/2021 1534   CO2 33 (H) 06/01/2021 1534   BUN 20 06/01/2021 1534   CREATININE 0.79 06/01/2021 1534   CREATININE 1.50 (H) 12/17/2019 1519      Component Value Date/Time   CALCIUM 10.1 06/01/2021 1534   ALKPHOS 39 06/01/2021 1534   AST 18 06/01/2021 1534    ALT 13 06/01/2021 1534   BILITOT 0.4 06/01/2021 1534       RADIOGRAPHIC STUDIES: CT Chest W Contrast  Result Date: 06/04/2021 CLINICAL DATA:  Small cell lung cancer staging EXAM: CT CHEST, ABDOMEN, AND PELVIS WITH CONTRAST TECHNIQUE: Multidetector CT imaging of the chest, abdomen and pelvis was performed following the standard protocol during bolus administration of intravenous contrast. RADIATION DOSE REDUCTION: This exam was performed according to the departmental dose-optimization program which includes automated exposure control, adjustment of the mA and/or kV according to patient size and/or use of iterative reconstruction technique. CONTRAST:  145m OMNIPAQUE IOHEXOL 300 MG/ML  SOLN COMPARISON:  Chest CT dated March 02, 2021 FINDINGS: CT CHEST FINDINGS: Cardiovascular: Normal heart size. No pericardial effusion. Three-vessel coronary artery calcifications. Atherosclerotic disease of the thoracic aorta. Dilated main pulmonary artery, measuring up to 3.3 cm. No suspicious filling defects of the central pulmonary arteries. Right chest wall port with tip in the right atrium. Mediastinum/Nodes: Mildly patulous esophagus. Thyroid is unremarkable. No pathologically enlarged lymph nodes seen in the chest. Lungs/Pleura: Central airways are patent. Centrilobular emphysema. Large right basilar bleb interval increased size of left lower lobe subpleural mass, measuring 3.5 x 1.6 cm on series 6, image 69, previously measured up to 1.4 x 1.0 cm. Additional new satellite juxtapleural nodule is seen in the left lower lobe measuring 0.9 x 0.6 cm on image 84. Musculoskeletal: No chest wall mass or suspicious bone lesions identified. CT ABDOMEN PELVIS FINDINGS Hepatobiliary: New low-attenuation liver lesions are seen in the right hepatic lobe measuring 1.1 cm on series 2, image 55. Increased size of low-attenuation lesion of the inferior right hepatic lobe measuring up to 1.5 cm on image 56, previously 1.0 cm. Nodular  contour of the liver. Additional previously seen liver lesions are unchanged in size. Surgically absent gallbladder with no biliary ductal dilation. Pancreas: Unremarkable. No pancreatic ductal dilatation or surrounding inflammatory changes. Spleen: Normal in size without focal abnormality. Adrenals/Urinary Tract: Bilateral adrenal glands are unremarkable. Kidneys enhance symmetrically with no evidence of hydronephrosis or nephrolithiasis. Bladder is decompressed. Stomach/Bowel: Stomach is within normal limits. Diverticulosis. No evidence of bowel wall thickening, distention, or inflammatory changes. Vascular/Lymphatic: Aortic atherosclerosis. No enlarged abdominal or pelvic lymph nodes. Reproductive: No adnexal masses. Other: Ventral hernia mesh.  No abdominopelvic  ascites. Musculoskeletal: Unchanged sclerotic osseous lesions of the pelvis and lumbar spine. IMPRESSION: 1. Interval increased size subpleural mass of the superior segment of the left lower lobe with new subcentimeter satellite nodule, concerning for progressive disease. 2. New and enlarging low-attenuation liver lesions, concerning for progressive hepatic metastatic disease. 3. No evidence new osseous metastatic disease. 4. Coronary artery calcifications, aortic Atherosclerosis (ICD10-I70.0) and Emphysema (ICD10-J43.9). Electronically Signed   By: Yetta Glassman M.D.   On: 06/04/2021 20:32   CT Abdomen Pelvis W Contrast  Result Date: 06/04/2021 CLINICAL DATA:  Small cell lung cancer staging EXAM: CT CHEST, ABDOMEN, AND PELVIS WITH CONTRAST TECHNIQUE: Multidetector CT imaging of the chest, abdomen and pelvis was performed following the standard protocol during bolus administration of intravenous contrast. RADIATION DOSE REDUCTION: This exam was performed according to the departmental dose-optimization program which includes automated exposure control, adjustment of the mA and/or kV according to patient size and/or use of iterative reconstruction  technique. CONTRAST:  177m OMNIPAQUE IOHEXOL 300 MG/ML  SOLN COMPARISON:  Chest CT dated March 02, 2021 FINDINGS: CT CHEST FINDINGS: Cardiovascular: Normal heart size. No pericardial effusion. Three-vessel coronary artery calcifications. Atherosclerotic disease of the thoracic aorta. Dilated main pulmonary artery, measuring up to 3.3 cm. No suspicious filling defects of the central pulmonary arteries. Right chest wall port with tip in the right atrium. Mediastinum/Nodes: Mildly patulous esophagus. Thyroid is unremarkable. No pathologically enlarged lymph nodes seen in the chest. Lungs/Pleura: Central airways are patent. Centrilobular emphysema. Large right basilar bleb interval increased size of left lower lobe subpleural mass, measuring 3.5 x 1.6 cm on series 6, image 69, previously measured up to 1.4 x 1.0 cm. Additional new satellite juxtapleural nodule is seen in the left lower lobe measuring 0.9 x 0.6 cm on image 84. Musculoskeletal: No chest wall mass or suspicious bone lesions identified. CT ABDOMEN PELVIS FINDINGS Hepatobiliary: New low-attenuation liver lesions are seen in the right hepatic lobe measuring 1.1 cm on series 2, image 55. Increased size of low-attenuation lesion of the inferior right hepatic lobe measuring up to 1.5 cm on image 56, previously 1.0 cm. Nodular contour of the liver. Additional previously seen liver lesions are unchanged in size. Surgically absent gallbladder with no biliary ductal dilation. Pancreas: Unremarkable. No pancreatic ductal dilatation or surrounding inflammatory changes. Spleen: Normal in size without focal abnormality. Adrenals/Urinary Tract: Bilateral adrenal glands are unremarkable. Kidneys enhance symmetrically with no evidence of hydronephrosis or nephrolithiasis. Bladder is decompressed. Stomach/Bowel: Stomach is within normal limits. Diverticulosis. No evidence of bowel wall thickening, distention, or inflammatory changes. Vascular/Lymphatic: Aortic  atherosclerosis. No enlarged abdominal or pelvic lymph nodes. Reproductive: No adnexal masses. Other: Ventral hernia mesh.  No abdominopelvic ascites. Musculoskeletal: Unchanged sclerotic osseous lesions of the pelvis and lumbar spine. IMPRESSION: 1. Interval increased size subpleural mass of the superior segment of the left lower lobe with new subcentimeter satellite nodule, concerning for progressive disease. 2. New and enlarging low-attenuation liver lesions, concerning for progressive hepatic metastatic disease. 3. No evidence new osseous metastatic disease. 4. Coronary artery calcifications, aortic Atherosclerosis (ICD10-I70.0) and Emphysema (ICD10-J43.9). Electronically Signed   By: LYetta GlassmanM.D.   On: 06/04/2021 20:32    ASSESSMENT AND PLAN: This is a very pleasant 86years old white female recently diagnosed with extensive stage (T3, N2, M1c) small cell lung cancer presented with large left lower lobe lung mass in addition to right hilar and mediastinal lymphadenopathy as well as satellite nodule in the left lower lobe and peripheral pleural lesion in addition  to metastatic liver disease diagnosed in May 2022. The patient underwent systemic chemotherapy with carboplatin for AUC of 5 on day 1, etoposide 100 Mg/M2 on days 1, 2 and 3 with Cosela before the chemotherapy every 3 weeks on day 1.  Status post 4 cycles.  She tolerated her treatment fairly well and she has good response to this treatment. The patient has been on observation in the last 6 months and feeling fine with no concerning issues except for the baseline shortness of breath. She had repeat CT scan of the chest, abdomen pelvis performed recently.  I personally and independently reviewed the scan images and discussed the result with the patient today. Unfortunately her scan showed evidence for disease progression with enlarging subpleural mass in the superior segment of the left lower lobe with new subcentimeter satellite nodule as  well as new and enlarging liver lesions. I had a lengthy discussion with the patient today about her condition and treatment options. I gave her the option of palliative care and hospice referral versus consideration of palliative systemic chemotherapy again with carboplatin and 2 etoposide with Cosela for Myeloprotection.  The patient is interested in resuming treatment again with the same chemotherapy regimen. I reminded her with the adverse effect of this treatment including but not limited to alopecia, myelosuppression, nausea and vomiting, peripheral neuropathy, liver or renal dysfunction. She is expected to start the first cycle of this treatment next week. I will see her back for follow-up visit in 2 weeks for evaluation and management of any adverse effect of her treatment. She was advised to call immediately if she has any other concerning symptoms in the interval. The patient voices understanding of current disease status and treatment options and is in agreement with the current care plan.  All questions were answered. The patient knows to call the clinic with any problems, questions or concerns. We can certainly see the patient much sooner if necessary.   Disclaimer: This note was dictated with voice recognition software. Similar sounding words can inadvertently be transcribed and may not be corrected upon review.

## 2021-06-05 NOTE — Progress Notes (Signed)
DISCONTINUE ON PATHWAY REGIMEN - Small Cell Lung     Cycles 1 through 4: A cycle is every 21 days:     Durvalumab      Carboplatin      Etoposide    Cycles 5 and beyond: A cycle is every 28 days:     Durvalumab   **Always confirm dose/schedule in your pharmacy ordering system**  REASON: Disease Progression PRIOR TREATMENT: LOS420: Durvalumab 1,500 mg D1 + Carboplatin AUC=5 D1 + Etoposide 100 mg/m2 D1-3 q21 Days x 4 Cycles, Followed by Durvalumab 1,500 mg q28 Days Until Progression or Unacceptable Toxicity TREATMENT RESPONSE: Partial Response (PR)  START ON PATHWAY REGIMEN - Small Cell Lung     A cycle is every 21 days:     Carboplatin      Etoposide   **Always confirm dose/schedule in your pharmacy ordering system**  Patient Characteristics: Relapsed or Progressive Disease, Second Line, Relapse > 6 Months Therapeutic Status: Relapsed or Progressive Disease Line of Therapy: Second Line Time to Relapse: Relapse > 6 Months Intent of Therapy: Non-Curative / Palliative Intent, Discussed with Patient

## 2021-06-07 ENCOUNTER — Other Ambulatory Visit: Payer: Self-pay

## 2021-06-07 ENCOUNTER — Ambulatory Visit (INDEPENDENT_AMBULATORY_CARE_PROVIDER_SITE_OTHER): Payer: Medicare HMO

## 2021-06-07 ENCOUNTER — Encounter: Payer: Self-pay | Admitting: Orthopaedic Surgery

## 2021-06-07 ENCOUNTER — Ambulatory Visit (INDEPENDENT_AMBULATORY_CARE_PROVIDER_SITE_OTHER): Payer: Medicare HMO | Admitting: Orthopaedic Surgery

## 2021-06-07 DIAGNOSIS — M25561 Pain in right knee: Secondary | ICD-10-CM

## 2021-06-07 DIAGNOSIS — G8929 Other chronic pain: Secondary | ICD-10-CM

## 2021-06-07 MED ORDER — LIDOCAINE HCL 1 % IJ SOLN
5.0000 mL | INTRAMUSCULAR | Status: AC | PRN
  Administered 2021-06-07: 5 mL

## 2021-06-07 MED ORDER — METHYLPREDNISOLONE ACETATE 40 MG/ML IJ SUSP
80.0000 mg | INTRAMUSCULAR | Status: AC | PRN
  Administered 2021-06-07: 80 mg via INTRA_ARTICULAR

## 2021-06-07 NOTE — Progress Notes (Addendum)
? ?Office Visit Note ?  ?Patient: Dawn Gomez           ?Date of Birth: 08-05-1935           ?MRN: [id number] ?Visit Date: 06/07/2021 ?             ?Requested by: Biagio Borg, MD ?[place],  [place] [id number] ?PCP: Biagio Borg, MD ? ? ?Assessment & Plan: ?Visit Diagnoses: No diagnosis found. ? ?Plan: Dawn Gomez is a pleasant 86 year old woman who presents today with a chief complaint of right knee pain.  She has had this off and on for a while and relates a remote history of a patella fracture on her right knee.  She uses Tylenol and ice as needed.  She is also on tramadol.  She is having increased pain in her right knee no injury.  She is oxygen dependent and is currently undergoing treatment for small cell carcinoma lung.  Findings of her x-ray were consistent with arthritis.  We went forward with a cortisone injection into her right knee.  She may follow-up as needed ? ?Follow-Up Instructions: No follow-ups on file.  ? ?Orders:  ?No orders of the defined types were placed in this encounter. ? ?No orders of the defined types were placed in this encounter. ? ? ? ? Procedures: ?Large Joint Inj: R knee on 06/07/2021 2:27 PM ?Indications: pain and diagnostic evaluation ?Details: 25 G 1.5 in needle, anterolateral approach ? ?Arthrogram: No ? ?Medications: 80 mg methylPREDNISolone acetate 40 MG/ML; 5 mL lidocaine 1 % ?Outcome: tolerated well, no immediate complications ?Procedure, treatment alternatives, risks and benefits explained, specific risks discussed. Consent was given by the patient.  ? ? ? ? ?Clinical Data: ?No additional findings. ? ? ?Subjective: ?Chief Complaint  ?Patient presents with  ? Right Knee - Pain  ?Patient presents today for right knee pain. She said that it has been hurting badly for three weeks. She has pain anteriorly. She has noticed that it swells. She takes Tylenol for pain relief. She does have a history of fracturing her patella years ago.  ? ?HPI ? ?Review of Systems   ?All other systems reviewed and are negative. ? ? ?Objective: ?Vital Signs: There were no vitals taken for this visit. ? ?Physical Exam ?Constitutional:   ?   Appearance: Normal appearance.  ?Pulmonary:  ?   Effort: Pulmonary effort is normal.  ?Skin: ?   General: Skin is warm and dry.  ?Neurological:  ?   Mental Status: She is alert.  ? ? ?Ortho Exam ?Examination of her right knee she has no cellulitis no erythema she has some mild to moderate soft tissue swelling.  Positive grinding with range of motion.  She has some tenderness with laterally greater than medially.  Good varus and valgus stability.  Compartments are soft and nontender distal CMS is intact ?Specialty Comments:  ?No specialty comments available. ? ?Imaging: ?No results found. ? ? ?PMFS History: ?Patient Active Problem List  ? Diagnosis Date Noted  ? Anemia 06/02/2021  ? Cervical radiculitis 05/30/2021  ? Right knee pain 05/30/2021  ? RLQ abdominal pain 05/30/2021  ? Coronary artery calcification seen on CT scan 12/18/2020  ? Encounter for antineoplastic immunotherapy 09/26/2020  ? Port-A-Cath in place 09/18/2020  ? Small cell carcinoma of lower lobe of left lung ([place]) 08/31/2020  ? Encounter for antineoplastic chemotherapy 08/31/2020  ? Frequent nosebleeds 08/17/2020  ? Right lumbar radiculopathy 12/17/2019  ? Emphysema lung ([place])  03/03/2019  ? Aortic atherosclerosis ([place]) 03/03/2019  ? Healthcare maintenance 02/01/2019  ? Right ankle pain 10/06/2018  ? Sinusitis 08/28/2018  ? Costochondritis 04/10/2018  ? Leg pain 03/25/2018  ? Fall 08/13/2017  ?  Class: History of  ? Night sweats 07/13/2017  ? Dysuria 07/08/2017  ? Psoriasis 02/05/2017  ? Encounter for well adult exam with abnormal findings 02/05/2017  ? Chest pain 02/05/2017  ? Back pain 02/05/2017  ? Acute on chronic respiratory failure with hypoxia ([place]) 08/06/2016  ? Hypothyroidism 08/06/2016  ? Hyperglycemia 08/06/2016  ? Allergic rhinitis 07/16/2016  ? Chest wall mass 07/16/2016  ?  Hyperlipidemia 07/12/2016  ? SVT (supraventricular tachycardia) ([place]) 07/12/2016  ? Vitamin D deficiency 07/12/2016  ? Depression 07/12/2016  ? Chronic respiratory failure ([place]) 03/24/2015  ? Interstitial lung disease ([place]) 08/21/2007  ? ?Past Medical History:  ?Diagnosis Date  ? Abnormal heart rhythm   ? Aortic aneurysm ([place])   ? Aortic atherosclerosis ([place]) 03/03/2019  ? Cancer [facility])   ? Coronary artery calcification seen on CT scan 12/18/2020  ? Dyspnea   ? Hypothyroid   ? OSA (obstructive sleep apnea)   ? on cpap  ? Psoriasis 02/05/2017  ? Pulmonary fibrosis ([place])   ? SVT (supraventricular tachycardia) ([place])   ? Thyroid disease   ? hypo  ?  ?Family History  ?Problem Relation Age of Onset  ? Emphysema Father   ? Heart disease Father   ? Clotting disorder Father   ? Rheum arthritis Father   ? Lung cancer Father   ? Prostate cancer Father   ? Bone cancer Father   ? Emphysema Sister   ? Emphysema Mother   ? Heart disease Mother   ? Rheum arthritis Mother   ? Lung cancer Mother   ? Allergies Sister   ? Clotting disorder Sister   ? Rheum arthritis Sister   ? Breast cancer Sister   ? Cancer Sister   ?     ureter  ?  ?Past Surgical History:  ?Procedure Laterality Date  ? ABDOMINAL HYSTERECTOMY    ? APPENDECTOMY    ? CHOLECYSTECTOMY    ? CRANIOTOMY    ? for aneurysms  ? CRANIOTOMY  1980 x 2  ? aneurysmal clipping  ? HERNIA REPAIR    ? IR IMAGING GUIDED PORT INSERTION  09/15/2020  ? TOTAL ABDOMINAL HYSTERECTOMY    ? TUBAL LIGATION    ? ?Social History  ? ?Occupational History  ? Occupation: retired Therapist, sports  ? Occupation: retired  ?Tobacco Use  ? Smoking status: Former  ?  Packs/day: 2.00  ?  Years: 50.00  ?  Pack years: 100.00  ?  Types: Cigarettes  ?  Quit date: 2008  ?  Years since quitting: 15.1  ? Smokeless tobacco: Never  ?Substance and Sexual Activity  ? Alcohol use: Yes  ?  Comment: "drank back in her younger wilder days"  ? Drug use: No  ? Sexual activity: Not on file  ? ? ? ? ? ? ?

## 2021-06-11 MED FILL — Dexamethasone Sodium Phosphate Inj 100 MG/10ML: INTRAMUSCULAR | Qty: 1 | Status: AC

## 2021-06-11 MED FILL — Fosaprepitant Dimeglumine For IV Infusion 150 MG (Base Eq): INTRAVENOUS | Qty: 5 | Status: AC

## 2021-06-12 ENCOUNTER — Inpatient Hospital Stay: Payer: Medicare HMO

## 2021-06-12 ENCOUNTER — Other Ambulatory Visit: Payer: Self-pay

## 2021-06-12 VITALS — BP 108/52 | HR 58 | Temp 98.7°F | Resp 18 | Wt 152.5 lb

## 2021-06-12 DIAGNOSIS — C787 Secondary malignant neoplasm of liver and intrahepatic bile duct: Secondary | ICD-10-CM | POA: Insufficient documentation

## 2021-06-12 DIAGNOSIS — Z95828 Presence of other vascular implants and grafts: Secondary | ICD-10-CM

## 2021-06-12 DIAGNOSIS — Z510 Encounter for antineoplastic radiation therapy: Secondary | ICD-10-CM | POA: Insufficient documentation

## 2021-06-12 DIAGNOSIS — E119 Type 2 diabetes mellitus without complications: Secondary | ICD-10-CM | POA: Diagnosis not present

## 2021-06-12 DIAGNOSIS — C7931 Secondary malignant neoplasm of brain: Secondary | ICD-10-CM | POA: Diagnosis not present

## 2021-06-12 DIAGNOSIS — Z5111 Encounter for antineoplastic chemotherapy: Secondary | ICD-10-CM | POA: Insufficient documentation

## 2021-06-12 DIAGNOSIS — C3432 Malignant neoplasm of lower lobe, left bronchus or lung: Secondary | ICD-10-CM | POA: Insufficient documentation

## 2021-06-12 DIAGNOSIS — Z79899 Other long term (current) drug therapy: Secondary | ICD-10-CM | POA: Insufficient documentation

## 2021-06-12 DIAGNOSIS — R1031 Right lower quadrant pain: Secondary | ICD-10-CM

## 2021-06-12 LAB — CMP (CANCER CENTER ONLY)
ALT: 19 U/L (ref 0–44)
AST: 21 U/L (ref 15–41)
Albumin: 3.8 g/dL (ref 3.5–5.0)
Alkaline Phosphatase: 37 U/L — ABNORMAL LOW (ref 38–126)
Anion gap: 3 — ABNORMAL LOW (ref 5–15)
BUN: 19 mg/dL (ref 8–23)
CO2: 34 mmol/L — ABNORMAL HIGH (ref 22–32)
Calcium: 9.2 mg/dL (ref 8.9–10.3)
Chloride: 100 mmol/L (ref 98–111)
Creatinine: 0.71 mg/dL (ref 0.44–1.00)
GFR, Estimated: >60 mL/min
Glucose, Bld: 101 mg/dL — ABNORMAL HIGH (ref 70–99)
Potassium: 4.2 mmol/L (ref 3.5–5.1)
Sodium: 137 mmol/L (ref 135–145)
Total Bilirubin: 0.6 mg/dL (ref 0.3–1.2)
Total Protein: 6.2 g/dL — ABNORMAL LOW (ref 6.5–8.1)

## 2021-06-12 LAB — CBC WITH DIFFERENTIAL (CANCER CENTER ONLY)
Abs Immature Granulocytes: 0.06 10*3/uL (ref 0.00–0.07)
Basophils Absolute: 0 10*3/uL (ref 0.0–0.1)
Basophils Relative: 0 %
Eosinophils Absolute: 0.1 10*3/uL (ref 0.0–0.5)
Eosinophils Relative: 1 %
HCT: 32 % — ABNORMAL LOW (ref 36.0–46.0)
Hemoglobin: 10.8 g/dL — ABNORMAL LOW (ref 12.0–15.0)
Immature Granulocytes: 1 %
Lymphocytes Relative: 7 %
Lymphs Abs: 0.7 10*3/uL (ref 0.7–4.0)
MCH: 31.2 pg (ref 26.0–34.0)
MCHC: 33.8 g/dL (ref 30.0–36.0)
MCV: 92.5 fL (ref 80.0–100.0)
Monocytes Absolute: 0.5 10*3/uL (ref 0.1–1.0)
Monocytes Relative: 5 %
Neutro Abs: 8.6 10*3/uL — ABNORMAL HIGH (ref 1.7–7.7)
Neutrophils Relative %: 86 %
Platelet Count: 194 10*3/uL (ref 150–400)
RBC: 3.46 MIL/uL — ABNORMAL LOW (ref 3.87–5.11)
RDW: 14.3 % (ref 11.5–15.5)
WBC Count: 9.8 10*3/uL (ref 4.0–10.5)
nRBC: 0 % (ref 0.0–0.2)

## 2021-06-12 MED ORDER — HEPARIN SOD (PORK) LOCK FLUSH 100 UNIT/ML IV SOLN
500.0000 [IU] | Freq: Once | INTRAVENOUS | Status: AC | PRN
  Administered 2021-06-12: 500 [IU]

## 2021-06-12 MED ORDER — SODIUM CHLORIDE 0.9% FLUSH
10.0000 mL | Freq: Once | INTRAVENOUS | Status: AC
  Administered 2021-06-12: 10 mL

## 2021-06-12 MED ORDER — PALONOSETRON HCL INJECTION 0.25 MG/5ML
0.2500 mg | Freq: Once | INTRAVENOUS | Status: AC
  Administered 2021-06-12: 0.25 mg via INTRAVENOUS
  Filled 2021-06-12: qty 5

## 2021-06-12 MED ORDER — SODIUM CHLORIDE 0.9 % IV SOLN
100.0000 mg/m2 | Freq: Once | INTRAVENOUS | Status: AC
  Administered 2021-06-12: 170 mg via INTRAVENOUS
  Filled 2021-06-12: qty 8.5

## 2021-06-12 MED ORDER — SODIUM CHLORIDE 0.9 % IV SOLN: Freq: Once | INTRAVENOUS | Status: AC

## 2021-06-12 MED ORDER — SODIUM CHLORIDE 0.9 % IV SOLN
356.5000 mg | Freq: Once | INTRAVENOUS | Status: AC
  Administered 2021-06-12: 360 mg via INTRAVENOUS
  Filled 2021-06-12: qty 36

## 2021-06-12 MED ORDER — SODIUM CHLORIDE 0.9% FLUSH
10.0000 mL | INTRAVENOUS | Status: DC | PRN
  Administered 2021-06-12: 10 mL

## 2021-06-12 MED ORDER — SODIUM CHLORIDE 0.9 % IV SOLN
150.0000 mg | Freq: Once | INTRAVENOUS | Status: AC
  Administered 2021-06-12: 150 mg via INTRAVENOUS
  Filled 2021-06-12: qty 150

## 2021-06-12 MED ORDER — SODIUM CHLORIDE 0.9 % IV SOLN
10.0000 mg | Freq: Once | INTRAVENOUS | Status: AC
  Administered 2021-06-12: 10 mg via INTRAVENOUS
  Filled 2021-06-12: qty 10

## 2021-06-12 MED ORDER — TRILACICLIB DIHYDROCHLORIDE INJECTION 300 MG
240.0000 mg/m2 | Freq: Once | INTRAVENOUS | Status: AC
  Administered 2021-06-12: 405 mg via INTRAVENOUS
  Filled 2021-06-12: qty 27

## 2021-06-12 MED FILL — Dexamethasone Sodium Phosphate Inj 100 MG/10ML: INTRAMUSCULAR | Qty: 1 | Status: AC

## 2021-06-12 NOTE — Patient Instructions (Signed)
[facility]  Discharge Instructions: ?Thank you for choosing [facility] to provide your oncology and hematology care.  ? ?If you have a lab appointment with the [facility], please go directly to the [facility] and check in at the registration area. ?  ?Wear comfortable clothing and clothing appropriate for easy access to any Portacath or PICC line.  ? ?We strive to give you quality time with your provider. You may need to reschedule your appointment if you arrive late (15 or more minutes).  Arriving late affects you and other patients whose appointments are after yours.  Also, if you miss three or more appointments without notifying the office, you may be dismissed from the clinic at the provider?s discretion.    ?  ?For prescription refill requests, have your pharmacy contact our office and allow 72 hours for refills to be completed.   ? ?Today you received the following chemotherapy and/or immunotherapy agents cosela, etoposide, carboplatin    ?  ?To help prevent nausea and vomiting after your treatment, we encourage you to take your nausea medication as directed. ? ?BELOW ARE SYMPTOMS THAT SHOULD BE REPORTED IMMEDIATELY: ?*FEVER GREATER THAN 100.4 F (38 ?C) OR HIGHER ?*CHILLS OR SWEATING ?*NAUSEA AND VOMITING THAT IS NOT CONTROLLED WITH YOUR NAUSEA MEDICATION ?*UNUSUAL SHORTNESS OF BREATH ?*UNUSUAL BRUISING OR BLEEDING ?*URINARY PROBLEMS (pain or burning when urinating, or frequent urination) ?*BOWEL PROBLEMS (unusual diarrhea, constipation, pain near the anus) ?TENDERNESS IN MOUTH AND THROAT WITH OR WITHOUT PRESENCE OF ULCERS (sore throat, sores in mouth, or a toothache) ?UNUSUAL RASH, SWELLING OR PAIN  ?UNUSUAL VAGINAL DISCHARGE OR ITCHING  ? ?Items with * indicate a potential emergency and should be followed up as soon as possible or go to the Emergency Department if any problems should occur. ? ?Please show the CHEMOTHERAPY ALERT CARD or IMMUNOTHERAPY ALERT  CARD at check-in to the Emergency Department and triage nurse. ? ?Should you have questions after your visit or need to cancel or reschedule your appointment, please contact [facility]  Dept: [phone]  and follow the prompts.  Office hours are 8:00 a.m. to 4:30 p.m. Monday - Friday. Please note that voicemails left after 4:00 p.m. may not be returned until the following business day.  We are closed weekends and major holidays. You have access to a nurse at all times for urgent questions. Please call the main number to the clinic Dept: [phone] and follow the prompts. ? ? ?For any non-urgent questions, you may also contact your provider using MyChart. We now offer e-Visits for anyone 57 and older to request care online for non-urgent symptoms. For details visit [email or web address] ?  ?Also download the MyChart app! Go to the app store, search "MyChart", open the app, select [facility], and log in with your MyChart username and password. ? ?Due to Covid, a mask is required upon entering the hospital/clinic. If you do not have a mask, one will be given to you upon arrival. For doctor visits, patients may have 1 support person aged 23 or older with them. For treatment visits, patients cannot have anyone with them due to current Covid guidelines and our immunocompromised population.  ? ?

## 2021-06-13 ENCOUNTER — Inpatient Hospital Stay: Payer: Medicare HMO

## 2021-06-13 VITALS — BP 104/38 | HR 55 | Temp 98.1°F | Resp 18

## 2021-06-13 DIAGNOSIS — C3432 Malignant neoplasm of lower lobe, left bronchus or lung: Secondary | ICD-10-CM

## 2021-06-13 DIAGNOSIS — Z5111 Encounter for antineoplastic chemotherapy: Secondary | ICD-10-CM | POA: Diagnosis not present

## 2021-06-13 DIAGNOSIS — C7931 Secondary malignant neoplasm of brain: Secondary | ICD-10-CM | POA: Diagnosis not present

## 2021-06-13 DIAGNOSIS — C787 Secondary malignant neoplasm of liver and intrahepatic bile duct: Secondary | ICD-10-CM | POA: Diagnosis not present

## 2021-06-13 DIAGNOSIS — Z79899 Other long term (current) drug therapy: Secondary | ICD-10-CM | POA: Diagnosis not present

## 2021-06-13 DIAGNOSIS — E119 Type 2 diabetes mellitus without complications: Secondary | ICD-10-CM | POA: Diagnosis not present

## 2021-06-13 DIAGNOSIS — Z510 Encounter for antineoplastic radiation therapy: Secondary | ICD-10-CM | POA: Diagnosis not present

## 2021-06-13 MED ORDER — TRILACICLIB DIHYDROCHLORIDE INJECTION 300 MG
240.0000 mg/m2 | Freq: Once | INTRAVENOUS | Status: AC
  Administered 2021-06-13: 405 mg via INTRAVENOUS
  Filled 2021-06-13: qty 27

## 2021-06-13 MED ORDER — SODIUM CHLORIDE 0.9 % IV SOLN
10.0000 mg | Freq: Once | INTRAVENOUS | Status: AC
  Administered 2021-06-13: 10 mg via INTRAVENOUS
  Filled 2021-06-13: qty 10

## 2021-06-13 MED ORDER — SODIUM CHLORIDE 0.9 % IV SOLN
100.0000 mg/m2 | Freq: Once | INTRAVENOUS | Status: AC
  Administered 2021-06-13: 170 mg via INTRAVENOUS
  Filled 2021-06-13: qty 8.5

## 2021-06-13 MED ORDER — SODIUM CHLORIDE 0.9% FLUSH
10.0000 mL | INTRAVENOUS | Status: DC | PRN
  Administered 2021-06-13: 10 mL

## 2021-06-13 MED ORDER — HEPARIN SOD (PORK) LOCK FLUSH 100 UNIT/ML IV SOLN
500.0000 [IU] | Freq: Once | INTRAVENOUS | Status: AC | PRN
  Administered 2021-06-13: 500 [IU]

## 2021-06-13 MED ORDER — SODIUM CHLORIDE 0.9 % IV SOLN: Freq: Once | INTRAVENOUS | Status: AC

## 2021-06-13 MED FILL — Dexamethasone Sodium Phosphate Inj 100 MG/10ML: INTRAMUSCULAR | Qty: 1 | Status: AC

## 2021-06-13 NOTE — Patient Instructions (Signed)
[facility]  Discharge Instructions: ?Thank you for choosing [facility] to provide your oncology and hematology care.  ? ?If you have a lab appointment with the [facility], please go directly to the [facility] and check in at the registration area. ?  ?Wear comfortable clothing and clothing appropriate for easy access to any Portacath or PICC line.  ? ?We strive to give you quality time with your provider. You may need to reschedule your appointment if you arrive late (15 or more minutes).  Arriving late affects you and other patients whose appointments are after yours.  Also, if you miss three or more appointments without notifying the office, you may be dismissed from the clinic at the provider?s discretion.    ?  ?For prescription refill requests, have your pharmacy contact our office and allow 72 hours for refills to be completed.   ? ?Today you received the following chemotherapy and/or immunotherapy agents: Etoposide   ?  ?To help prevent nausea and vomiting after your treatment, we encourage you to take your nausea medication as directed. ? ?BELOW ARE SYMPTOMS THAT SHOULD BE REPORTED IMMEDIATELY: ?*FEVER GREATER THAN 100.4 F (38 ?C) OR HIGHER ?*CHILLS OR SWEATING ?*NAUSEA AND VOMITING THAT IS NOT CONTROLLED WITH YOUR NAUSEA MEDICATION ?*UNUSUAL SHORTNESS OF BREATH ?*UNUSUAL BRUISING OR BLEEDING ?*URINARY PROBLEMS (pain or burning when urinating, or frequent urination) ?*BOWEL PROBLEMS (unusual diarrhea, constipation, pain near the anus) ?TENDERNESS IN MOUTH AND THROAT WITH OR WITHOUT PRESENCE OF ULCERS (sore throat, sores in mouth, or a toothache) ?UNUSUAL RASH, SWELLING OR PAIN  ?UNUSUAL VAGINAL DISCHARGE OR ITCHING  ? ?Items with * indicate a potential emergency and should be followed up as soon as possible or go to the Emergency Department if any problems should occur. ? ?Please show the CHEMOTHERAPY ALERT CARD or IMMUNOTHERAPY ALERT CARD at check-in to  the Emergency Department and triage nurse. ? ?Should you have questions after your visit or need to cancel or reschedule your appointment, please contact [facility]  Dept: [phone]  and follow the prompts.  Office hours are 8:00 a.m. to 4:30 p.m. Monday - Friday. Please note that voicemails left after 4:00 p.m. may not be returned until the following business day.  We are closed weekends and major holidays. You have access to a nurse at all times for urgent questions. Please call the main number to the clinic Dept: [phone] and follow the prompts. ? ? ?For any non-urgent questions, you may also contact your provider using MyChart. We now offer e-Visits for anyone 76 and older to request care online for non-urgent symptoms. For details visit [email or web address] ?  ?Also download the MyChart app! Go to the app store, search "MyChart", open the app, select [facility], and log in with your MyChart username and password. ? ?Due to Covid, a mask is required upon entering the hospital/clinic. If you do not have a mask, one will be given to you upon arrival. For doctor visits, patients may have 1 support person aged 91 or older with them. For treatment visits, patients cannot have anyone with them due to current Covid guidelines and our immunocompromised population.  ? ?

## 2021-06-14 ENCOUNTER — Other Ambulatory Visit: Payer: Self-pay

## 2021-06-14 ENCOUNTER — Inpatient Hospital Stay: Payer: Medicare HMO

## 2021-06-14 VITALS — BP 114/43 | HR 55 | Temp 99.0°F | Resp 16

## 2021-06-14 DIAGNOSIS — Z5111 Encounter for antineoplastic chemotherapy: Secondary | ICD-10-CM | POA: Diagnosis not present

## 2021-06-14 DIAGNOSIS — C3432 Malignant neoplasm of lower lobe, left bronchus or lung: Secondary | ICD-10-CM

## 2021-06-14 DIAGNOSIS — Z510 Encounter for antineoplastic radiation therapy: Secondary | ICD-10-CM | POA: Diagnosis not present

## 2021-06-14 DIAGNOSIS — Z79899 Other long term (current) drug therapy: Secondary | ICD-10-CM | POA: Diagnosis not present

## 2021-06-14 DIAGNOSIS — C7931 Secondary malignant neoplasm of brain: Secondary | ICD-10-CM | POA: Diagnosis not present

## 2021-06-14 DIAGNOSIS — C787 Secondary malignant neoplasm of liver and intrahepatic bile duct: Secondary | ICD-10-CM | POA: Diagnosis not present

## 2021-06-14 DIAGNOSIS — E119 Type 2 diabetes mellitus without complications: Secondary | ICD-10-CM | POA: Diagnosis not present

## 2021-06-14 MED ORDER — SODIUM CHLORIDE 0.9 % IV SOLN
10.0000 mg | Freq: Once | INTRAVENOUS | Status: AC
  Administered 2021-06-14: 14:00:00 10 mg via INTRAVENOUS
  Filled 2021-06-14: qty 10

## 2021-06-14 MED ORDER — SODIUM CHLORIDE 0.9 % IV SOLN
100.0000 mg/m2 | Freq: Once | INTRAVENOUS | Status: AC
  Administered 2021-06-14: 15:00:00 170 mg via INTRAVENOUS
  Filled 2021-06-14: qty 8.5

## 2021-06-14 MED ORDER — SODIUM CHLORIDE 0.9% FLUSH
10.0000 mL | INTRAVENOUS | Status: DC | PRN
  Administered 2021-06-14: 16:00:00 10 mL

## 2021-06-14 MED ORDER — SODIUM CHLORIDE 0.9 % IV SOLN: Freq: Once | INTRAVENOUS | Status: AC

## 2021-06-14 MED ORDER — TRILACICLIB DIHYDROCHLORIDE INJECTION 300 MG
240.0000 mg/m2 | Freq: Once | INTRAVENOUS | Status: AC
  Administered 2021-06-14: 405 mg via INTRAVENOUS
  Filled 2021-06-14: qty 27

## 2021-06-14 MED ORDER — HEPARIN SOD (PORK) LOCK FLUSH 100 UNIT/ML IV SOLN
500.0000 [IU] | Freq: Once | INTRAVENOUS | Status: AC | PRN
  Administered 2021-06-14: 16:00:00 500 [IU]

## 2021-06-14 NOTE — Patient Instructions (Signed)
[facility]  Discharge Instructions: ?Thank you for choosing [facility] to provide your oncology and hematology care.  ? ?If you have a lab appointment with the [facility], please go directly to the [facility] and check in at the registration area. ?  ?Wear comfortable clothing and clothing appropriate for easy access to any Portacath or PICC line.  ? ?We strive to give you quality time with your provider. You may need to reschedule your appointment if you arrive late (15 or more minutes).  Arriving late affects you and other patients whose appointments are after yours.  Also, if you miss three or more appointments without notifying the office, you may be dismissed from the clinic at the provider?s discretion.    ?  ?For prescription refill requests, have your pharmacy contact our office and allow 72 hours for refills to be completed.   ? ?Today you received the following chemotherapy and/or immunotherapy agent: Etoposide  ?  ?To help prevent nausea and vomiting after your treatment, we encourage you to take your nausea medication as directed. ? ?BELOW ARE SYMPTOMS THAT SHOULD BE REPORTED IMMEDIATELY: ?*FEVER GREATER THAN 100.4 F (38 ?C) OR HIGHER ?*CHILLS OR SWEATING ?*NAUSEA AND VOMITING THAT IS NOT CONTROLLED WITH YOUR NAUSEA MEDICATION ?*UNUSUAL SHORTNESS OF BREATH ?*UNUSUAL BRUISING OR BLEEDING ?*URINARY PROBLEMS (pain or burning when urinating, or frequent urination) ?*BOWEL PROBLEMS (unusual diarrhea, constipation, pain near the anus) ?TENDERNESS IN MOUTH AND THROAT WITH OR WITHOUT PRESENCE OF ULCERS (sore throat, sores in mouth, or a toothache) ?UNUSUAL RASH, SWELLING OR PAIN  ?UNUSUAL VAGINAL DISCHARGE OR ITCHING  ? ?Items with * indicate a potential emergency and should be followed up as soon as possible or go to the Emergency Department if any problems should occur. ? ?Please show the CHEMOTHERAPY ALERT CARD or IMMUNOTHERAPY ALERT CARD at check-in to the  Emergency Department and triage nurse. ? ?Should you have questions after your visit or need to cancel or reschedule your appointment, please contact [facility]  Dept: [phone]  and follow the prompts.  Office hours are 8:00 a.m. to 4:30 p.m. Monday - Friday. Please note that voicemails left after 4:00 p.m. may not be returned until the following business day.  We are closed weekends and major holidays. You have access to a nurse at all times for urgent questions. Please call the main number to the clinic Dept: [phone] and follow the prompts. ? ? ?For any non-urgent questions, you may also contact your provider using MyChart. We now offer e-Visits for anyone 57 and older to request care online for non-urgent symptoms. For details visit [email or web address]. ?  ?Also download the MyChart app! Go to the app store, search "MyChart", open the app, select [facility], and log in with your MyChart username and password. ? ?Due to Covid, a mask is required upon entering the hospital/clinic. If you do not have a mask, one will be given to you upon arrival. For doctor visits, patients may have 1 support person aged 39 or older with them. For treatment visits, patients cannot have anyone with them due to current Covid guidelines and our immunocompromised population.  ? ?

## 2021-06-14 NOTE — Progress Notes (Unsigned)
[facility] OFFICE PROGRESS NOTE  Biagio Borg, [place] [id number]  DIAGNOSIS: Extensive stage (T3, N2, M1 C) small cell lung cancer presented with large left lower lobe lung mass in addition to right hilar and mediastinal lymphadenopathy as well as satellite nodule in the left lower lobe and peripheral pleural lesion as well as metastatic liver lesions diagnosed in May 2022.  PRIOR THERAPY: Systemic chemotherapy with carboplatin for AUC of 5 from day 1, etoposide 100 Mg/M2 on days 1, 2 and 3 with Cosela before the chemotherapy every 3 weeks.  Status post 4 cycles.  First cycle started September 18, 2020.  Status post 4 cycles.  The patient did not receive immunotherapy because of the extensive psoriasis.  Last dose of treatment was given on November 20, 2020    CURRENT THERAPY: Second line systemic chemotherapy with carboplatin for AUC of 5 on day 1 and etoposide 100 Mg/M2 on days 1, 2 and 3 with Cosela 240 Mg/M2 before chemotherapy every 3 weeks.  First dose June 12, 2021.  Status post 1 cycle.  INTERVAL HISTORY: Dawn Gomez 86 y.o. female returns to clinic today for follow-up visit.  The patient is feeling ***today without any concerning complaints except for ***.  The patient was recently found to have evidence of disease recurrence.  She is not a candidate for immunotherapy due to her history of psoriasis.  She underwent cycle #1 of chemotherapy last week and tolerated well without any concerning adverse side effects except for ***.  Today, she denies any fever, chills, night sweats, or unexplained weight loss.  She reports her baseline dyspnea on exertion but denies any cough, chest pain, or hemoptysis.  Denies any nausea, vomiting, diarrhea, or constipation.  Denies any headache or visual changes.  She is here today for evaluation and 1 week follow-up visit to manage any adverse side effects of treatment. MEDICAL HISTORY: Past Medical History:   Diagnosis Date   Abnormal heart rhythm    Aortic aneurysm (HCC)    Aortic atherosclerosis ([facility]) 03/03/2019   Cancer ([facility])    Coronary artery calcification seen on CT scan 12/18/2020   Dyspnea    Hypothyroid    OSA (obstructive sleep apnea)    on cpap   Psoriasis 02/05/2017   Pulmonary fibrosis (HCC)    SVT (supraventricular tachycardia) (HCC)    Thyroid disease    hypo    ALLERGIES:  is allergic to lipitor [atorvastatin], codeine, hydromorphone hcl, levaquin [levofloxacin in d5w], morphine, oxycodone-acetaminophen, propoxyphene n-acetaminophen, and simvastatin.  MEDICATIONS:  Current Outpatient Medications  Medication Sig Dispense Refill   acetaminophen (TYLENOL) 500 MG tablet Take 500 mg by mouth every 6 (six) hours as needed.     Ascorbic Acid (VITAMIN C PO) Take by mouth.     Azelastine-Fluticasone 137-50 MCG/ACT SUSP Place 1 spray into the nose every 12 (twelve) hours. 23 g 5   Blood Glucose Monitoring Suppl (TRUE METRIX METER) w/Device KIT USE AS DIRECTED 1 kit 0   CALCIUM-MAGNESIUM-ZINC PO Take 1 tablet by mouth daily.     cholecalciferol (VITAMIN D) 1000 units tablet Take 5,000 Units by mouth daily.     diltiazem (CARDIZEM CD) 180 MG 24 hr capsule TAKE 1 CAPSULE EVERY DAY 90 capsule 1   escitalopram (LEXAPRO) 20 MG tablet TAKE 1 TABLET AT BEDTIME 90 tablet 2   fenofibrate 160 MG tablet TAKE 1 TABLET EVERY DAY 90 tablet 1   fluocinonide (LIDEX) 0.05 % external  solution APP EXT AA OF SCALP NIGHTLY  3   furosemide (LASIX) 80 MG tablet TAKE 1 TABLET EVERY DAY 90 tablet 1   glucose blood (TRUE METRIX BLOOD GLUCOSE TEST) test strip TEST  UP  TO FOUR TIMES DAILY AS DIRECTED 100 strip 3   guaiFENesin (MUCINEX) 600 MG 12 hr tablet Take 2 tablets (1,200 mg total) by mouth 2 (two) times daily as needed. 60 tablet 2   ipratropium-albuterol (DUONEB) 0.5-2.5 (3) MG/3ML SOLN Take 3 mLs by nebulization every 4 (four) hours as needed. 360 mL 0   levothyroxine (SYNTHROID) 100 MCG tablet  TAKE 1 TABLET EVERY DAY BEFORE BREAKFAST 90 tablet 2   lidocaine-prilocaine (EMLA) cream 30 Squeeze  1/2 tsp on a cotton ball and apply to skin over port a cath site. Do not rub it in. Cover with plastic wrap. Apply 1-2 hours prior to your treatment. 30 g 0   lisinopril (ZESTRIL) 5 MG tablet TAKE 1 TABLET EVERY DAY 90 tablet 3   potassium chloride SA (KLOR-CON M) 20 MEQ tablet TAKE 1 TABLET EVERY DAY WITH FOOD 90 tablet 1   predniSONE (DELTASONE) 10 MG tablet 3 tabs by mouth per day for 3 days,2tabs per day for 3 days,1tab per day for 3 days 18 tablet 0   prochlorperazine (COMPAZINE) 10 MG tablet Take 1 tablet (10 mg total) by mouth every 6 (six) hours as needed for nausea or vomiting. 30 tablet 0   rosuvastatin (CRESTOR) 20 MG tablet TAKE 1 TABLET EVERY DAY 90 tablet 2   tiZANidine (ZANAFLEX) 2 MG tablet TAKE 1 TABLET THREE TIMES DAILY 270 tablet 1   traMADol (ULTRAM) 50 MG tablet TAKE 1 TABLET BY MOUTH FOUR TIMES DAILY. 360 tablet 1   traZODone (DESYREL) 50 MG tablet TAKE 1 TABLET (50 MG TOTAL) BY MOUTH AT BEDTIME AS NEEDED FOR SLEEP. 90 tablet 1   TRUEplus Lancets 33G MISC TEST  UP  TO FOUR TIMES DAILY AS DIRECTED 100 each 3   vitamin B-12 (CYANOCOBALAMIN) 1000 MCG tablet Take 500 mcg by mouth daily.     vitamin C (ASCORBIC ACID) 500 MG tablet Take 500 mg by mouth daily.     Vitamin D, Ergocalciferol, (DRISDOL) 50000 units CAPS capsule Take 1 capsule (50,000 Units total) by mouth every 7 (seven) days. 12 capsule 0   vitamin E 400 UNIT capsule Take 200 Units by mouth daily.     No current facility-administered medications for this visit.   Facility-Administered Medications Ordered in Other Visits  Medication Dose Route Frequency Provider Last Rate Last Admin   etoposide (VEPESID) 170 mg in sodium chloride 0.9 % 500 mL chemo infusion  100 mg/m2 (Treatment Plan Recorded) Intravenous Once Curt Bears, MD       heparin lock flush 100 unit/mL  500 Units Intracatheter Once PRN Curt Bears, MD       sodium chloride flush (NS) 0.9 % injection 10 mL  10 mL Intracatheter PRN Curt Bears, MD       trilaciclib dihydrochloride (COSELA) 405 mg in dextrose 5 % 250 mL (1.4621 mg/mL) infusion  240 mg/m2 (Treatment Plan Recorded) Intravenous Once Curt Bears, MD 554 mL/hr at 06/14/21 1414 405 mg at 06/14/21 1414    SURGICAL HISTORY:  Past Surgical History:  Procedure Laterality Date   ABDOMINAL HYSTERECTOMY     APPENDECTOMY     CHOLECYSTECTOMY     CRANIOTOMY     for aneurysms   CRANIOTOMY  1980 x 2   aneurysmal clipping  HERNIA REPAIR     IR IMAGING GUIDED PORT INSERTION  09/15/2020   TOTAL ABDOMINAL HYSTERECTOMY     TUBAL LIGATION      REVIEW OF SYSTEMS:   Review of Systems  Constitutional: Negative for appetite change, chills, fatigue, fever and unexpected weight change.  HENT:   Negative for mouth sores, nosebleeds, sore throat and trouble swallowing.   Eyes: Negative for eye problems and icterus.  Respiratory: Negative for cough, hemoptysis, shortness of breath and wheezing.   Cardiovascular: Negative for chest pain and leg swelling.  Gastrointestinal: Negative for abdominal pain, constipation, diarrhea, nausea and vomiting.  Genitourinary: Negative for bladder incontinence, difficulty urinating, dysuria, frequency and hematuria.   Musculoskeletal: Negative for back pain, gait problem, neck pain and neck stiffness.  Skin: Negative for itching and rash.  Neurological: Negative for dizziness, extremity weakness, gait problem, headaches, light-headedness and seizures.  Hematological: Negative for adenopathy. Does not bruise/bleed easily.  Psychiatric/Behavioral: Negative for confusion, depression and sleep disturbance. The patient is not nervous/anxious.     PHYSICAL EXAMINATION:  There were no vitals taken for this visit.  ECOG PERFORMANCE STATUS: {CHL ONC ECOG [id number]  Physical Exam  Constitutional: Oriented to person, place, and time and  well-developed, well-nourished, and in no distress. No distress.  HENT:  Head: Normocephalic and atraumatic.  Mouth/Throat: Oropharynx is clear and moist. No oropharyngeal exudate.  Eyes: Conjunctivae are normal. Right eye exhibits no discharge. Left eye exhibits no discharge. No scleral icterus.  Neck: Normal range of motion. Neck supple.  Cardiovascular: Normal rate, regular rhythm, normal heart sounds and intact distal pulses.   Pulmonary/Chest: Effort normal and breath sounds normal. No respiratory distress. No wheezes. No rales.  Abdominal: Soft. Bowel sounds are normal. Exhibits no distension and no mass. There is no tenderness.  Musculoskeletal: Normal range of motion. Exhibits no edema.  Lymphadenopathy:    No cervical adenopathy.  Neurological: Alert and oriented to person, place, and time. Exhibits normal muscle tone. Gait normal. Coordination normal.  Skin: Skin is warm and dry. No rash noted. Not diaphoretic. No erythema. No pallor.  Psychiatric: Mood, memory and judgment normal.  Vitals reviewed.  LABORATORY DATA: Lab Results  Component Value Date   WBC 9.8 06/12/2021   HGB 10.8 (L) 06/12/2021   HCT 32.0 (L) 06/12/2021   MCV 92.5 06/12/2021   PLT 194 06/12/2021      Chemistry      Component Value Date/Time   NA 137 06/12/2021 1137   K 4.2 06/12/2021 1137   CL 100 06/12/2021 1137   CO2 34 (H) 06/12/2021 1137   BUN 19 06/12/2021 1137   CREATININE 0.71 06/12/2021 1137   CREATININE 1.50 (H) 12/17/2019 1519      Component Value Date/Time   CALCIUM 9.2 06/12/2021 1137   ALKPHOS 37 (L) 06/12/2021 1137   AST 21 06/12/2021 1137   ALT 19 06/12/2021 1137   BILITOT 0.6 06/12/2021 1137       RADIOGRAPHIC STUDIES:  CT Chest W Contrast  Result Date: 06/04/2021 CLINICAL DATA:  Small cell lung cancer staging EXAM: CT CHEST, ABDOMEN, AND PELVIS WITH CONTRAST TECHNIQUE: Multidetector CT imaging of the chest, abdomen and pelvis was performed following the standard  protocol during bolus administration of intravenous contrast. RADIATION DOSE REDUCTION: This exam was performed according to the departmental dose-optimization program which includes automated exposure control, adjustment of the mA and/or kV according to patient size and/or use of iterative reconstruction technique. CONTRAST:  124m OMNIPAQUE IOHEXOL 300 MG/ML  SOLN  COMPARISON:  Chest CT dated March 02, 2021 FINDINGS: CT CHEST FINDINGS: Cardiovascular: Normal heart size. No pericardial effusion. Three-vessel coronary artery calcifications. Atherosclerotic disease of the thoracic aorta. Dilated main pulmonary artery, measuring up to 3.3 cm. No suspicious filling defects of the central pulmonary arteries. Right chest wall port with tip in the right atrium. Mediastinum/Nodes: Mildly patulous esophagus. Thyroid is unremarkable. No pathologically enlarged lymph nodes seen in the chest. Lungs/Pleura: Central airways are patent. Centrilobular emphysema. Large right basilar bleb interval increased size of left lower lobe subpleural mass, measuring 3.5 x 1.6 cm on series 6, image 69, previously measured up to 1.4 x 1.0 cm. Additional new satellite juxtapleural nodule is seen in the left lower lobe measuring 0.9 x 0.6 cm on image 84. Musculoskeletal: No chest wall mass or suspicious bone lesions identified. CT ABDOMEN PELVIS FINDINGS Hepatobiliary: New low-attenuation liver lesions are seen in the right hepatic lobe measuring 1.1 cm on series 2, image 55. Increased size of low-attenuation lesion of the inferior right hepatic lobe measuring up to 1.5 cm on image 56, previously 1.0 cm. Nodular contour of the liver. Additional previously seen liver lesions are unchanged in size. Surgically absent gallbladder with no biliary ductal dilation. Pancreas: Unremarkable. No pancreatic ductal dilatation or surrounding inflammatory changes. Spleen: Normal in size without focal abnormality. Adrenals/Urinary Tract: Bilateral adrenal  glands are unremarkable. Kidneys enhance symmetrically with no evidence of hydronephrosis or nephrolithiasis. Bladder is decompressed. Stomach/Bowel: Stomach is within normal limits. Diverticulosis. No evidence of bowel wall thickening, distention, or inflammatory changes. Vascular/Lymphatic: Aortic atherosclerosis. No enlarged abdominal or pelvic lymph nodes. Reproductive: No adnexal masses. Other: Ventral hernia mesh.  No abdominopelvic ascites. Musculoskeletal: Unchanged sclerotic osseous lesions of the pelvis and lumbar spine. IMPRESSION: 1. Interval increased size subpleural mass of the superior segment of the left lower lobe with new subcentimeter satellite nodule, concerning for progressive disease. 2. New and enlarging low-attenuation liver lesions, concerning for progressive hepatic metastatic disease. 3. No evidence new osseous metastatic disease. 4. Coronary artery calcifications, aortic Atherosclerosis (ICD10-I70.0) and Emphysema (ICD10-J43.9). Electronically Signed   By: Yetta Glassman M.D.   On: 06/04/2021 20:32   CT Abdomen Pelvis W Contrast  Result Date: 06/04/2021 CLINICAL DATA:  Small cell lung cancer staging EXAM: CT CHEST, ABDOMEN, AND PELVIS WITH CONTRAST TECHNIQUE: Multidetector CT imaging of the chest, abdomen and pelvis was performed following the standard protocol during bolus administration of intravenous contrast. RADIATION DOSE REDUCTION: This exam was performed according to the departmental dose-optimization program which includes automated exposure control, adjustment of the mA and/or kV according to patient size and/or use of iterative reconstruction technique. CONTRAST:  176m OMNIPAQUE IOHEXOL 300 MG/ML  SOLN COMPARISON:  Chest CT dated March 02, 2021 FINDINGS: CT CHEST FINDINGS: Cardiovascular: Normal heart size. No pericardial effusion. Three-vessel coronary artery calcifications. Atherosclerotic disease of the thoracic aorta. Dilated main pulmonary artery, measuring up to  3.3 cm. No suspicious filling defects of the central pulmonary arteries. Right chest wall port with tip in the right atrium. Mediastinum/Nodes: Mildly patulous esophagus. Thyroid is unremarkable. No pathologically enlarged lymph nodes seen in the chest. Lungs/Pleura: Central airways are patent. Centrilobular emphysema. Large right basilar bleb interval increased size of left lower lobe subpleural mass, measuring 3.5 x 1.6 cm on series 6, image 69, previously measured up to 1.4 x 1.0 cm. Additional new satellite juxtapleural nodule is seen in the left lower lobe measuring 0.9 x 0.6 cm on image 84. Musculoskeletal: No chest wall mass or suspicious bone lesions identified.  CT ABDOMEN PELVIS FINDINGS Hepatobiliary: New low-attenuation liver lesions are seen in the right hepatic lobe measuring 1.1 cm on series 2, image 55. Increased size of low-attenuation lesion of the inferior right hepatic lobe measuring up to 1.5 cm on image 56, previously 1.0 cm. Nodular contour of the liver. Additional previously seen liver lesions are unchanged in size. Surgically absent gallbladder with no biliary ductal dilation. Pancreas: Unremarkable. No pancreatic ductal dilatation or surrounding inflammatory changes. Spleen: Normal in size without focal abnormality. Adrenals/Urinary Tract: Bilateral adrenal glands are unremarkable. Kidneys enhance symmetrically with no evidence of hydronephrosis or nephrolithiasis. Bladder is decompressed. Stomach/Bowel: Stomach is within normal limits. Diverticulosis. No evidence of bowel wall thickening, distention, or inflammatory changes. Vascular/Lymphatic: Aortic atherosclerosis. No enlarged abdominal or pelvic lymph nodes. Reproductive: No adnexal masses. Other: Ventral hernia mesh.  No abdominopelvic ascites. Musculoskeletal: Unchanged sclerotic osseous lesions of the pelvis and lumbar spine. IMPRESSION: 1. Interval increased size subpleural mass of the superior segment of the left lower lobe with  new subcentimeter satellite nodule, concerning for progressive disease. 2. New and enlarging low-attenuation liver lesions, concerning for progressive hepatic metastatic disease. 3. No evidence new osseous metastatic disease. 4. Coronary artery calcifications, aortic Atherosclerosis (ICD10-I70.0) and Emphysema (ICD10-J43.9). Electronically Signed   By: Yetta Glassman M.D.   On: 06/04/2021 20:32   XR KNEE 3 VIEW RIGHT  Result Date: 06/07/2021 Radiographs of the right knee were reviewed today she has tricompartmental arthritis with sclerotic changes and joint space narrowing.  Osteophytes most noted on the lateral compartment and patellofemoral joint no acute osseous findings    ASSESSMENT/PLAN:  This is a very pleasant 86 year old Caucasian female diagnosed with extensive stage (T3, N2, M1C) small cell lung cancer.  She initially presented with a large left lower lobe lung mass in addition to right hilar and mediastinal lymphadenopathy.  She also has satellite nodules in the left lower lobe and peripheral pleural lesion in addition to metastatic disease in the liver.  She was diagnosed initially in May 2022.  She first underwent systemic chemotherapy with carboplatin for an AC of 5 on day 1, etoposide 100 mg per metered squared on days 1, 2, and 3 with Cosela before the chemotherapy on day 1.  She is status post 4 cycles.  Immunotherapy***.  He tolerated treatment well and had a good response to treatment.  She had been on observation for 6 months until a restaging scan showed evidence of disease progression with enlarging subpleural mass in the superior segment of the left lower lobe and new subcentimeter satellite nodules as well as a new and enlarging liver lesions.  Therefore, she restarted systemic chemotherapy with carboplatin on day 1 and etoposide on days 1, 2, and 3.  She also has Cosela for myelo protection.  She underwent her first cycle of treatment last week and tolerated it ***  The  patient was seen with Dr. Julien Nordmann today.  Labs were reviewed.  Recommend that she ***continue on the same treatment the same dose.  We will see her back for follow-up visit in 2 weeks for evaluation before starting cycle #2 of treatment.  The patient was advised to call immediately if she has any concerning symptoms in the interval. The patient voices understanding of current disease status and treatment options and is in agreement with the current care plan. All questions were answered. The patient knows to call the clinic with any problems, questions or concerns. We can certainly see the patient much sooner if necessary  No  orders of the defined types were placed in this encounter.    I spent {CHL ONC TIME VISIT - MHDQQ:[id number]} counseling the patient face to face. The total time spent in the appointment was {CHL ONC TIME VISIT - HERDE:[id number]}.  Marshe Shrestha L Darold Miley, PA-C 06/14/21

## 2021-06-17 ENCOUNTER — Encounter (HOSPITAL_COMMUNITY): Payer: Self-pay

## 2021-06-17 ENCOUNTER — Emergency Department (HOSPITAL_COMMUNITY)
Admission: EM | Admit: 2021-06-17 | Discharge: 2021-06-17 | Disposition: A | Discharge status: Home / Self Care | Payer: Medicare HMO | Attending: Emergency Medicine | Admitting: Emergency Medicine

## 2021-06-17 ENCOUNTER — Other Ambulatory Visit: Payer: Self-pay

## 2021-06-17 ENCOUNTER — Emergency Department (HOSPITAL_COMMUNITY): Payer: Medicare HMO

## 2021-06-17 DIAGNOSIS — R12 Heartburn: Secondary | ICD-10-CM | POA: Insufficient documentation

## 2021-06-17 DIAGNOSIS — R197 Diarrhea, unspecified: Secondary | ICD-10-CM

## 2021-06-17 DIAGNOSIS — C7931 Secondary malignant neoplasm of brain: Secondary | ICD-10-CM | POA: Insufficient documentation

## 2021-06-17 DIAGNOSIS — D649 Anemia, unspecified: Secondary | ICD-10-CM | POA: Diagnosis not present

## 2021-06-17 DIAGNOSIS — Z85118 Personal history of other malignant neoplasm of bronchus and lung: Secondary | ICD-10-CM | POA: Diagnosis not present

## 2021-06-17 DIAGNOSIS — Z87891 Personal history of nicotine dependence: Secondary | ICD-10-CM | POA: Insufficient documentation

## 2021-06-17 DIAGNOSIS — R350 Frequency of micturition: Secondary | ICD-10-CM | POA: Diagnosis not present

## 2021-06-17 DIAGNOSIS — R4182 Altered mental status, unspecified: Secondary | ICD-10-CM | POA: Diagnosis not present

## 2021-06-17 DIAGNOSIS — E860 Dehydration: Secondary | ICD-10-CM | POA: Diagnosis not present

## 2021-06-17 LAB — CBC WITH DIFFERENTIAL/PLATELET
Abs Immature Granulocytes: 0.05 10*3/uL (ref 0.00–0.07)
Basophils Absolute: 0 10*3/uL (ref 0.0–0.1)
Basophils Relative: 0 %
Eosinophils Absolute: 0.1 10*3/uL (ref 0.0–0.5)
Eosinophils Relative: 2 %
HCT: 30.8 % — ABNORMAL LOW (ref 36.0–46.0)
Hemoglobin: 9.9 g/dL — ABNORMAL LOW (ref 12.0–15.0)
Immature Granulocytes: 1 %
Lymphocytes Relative: 20 %
Lymphs Abs: 1.1 10*3/uL (ref 0.7–4.0)
MCH: 30.7 pg (ref 26.0–34.0)
MCHC: 32.1 g/dL (ref 30.0–36.0)
MCV: 95.4 fL (ref 80.0–100.0)
Monocytes Absolute: 0.1 10*3/uL (ref 0.1–1.0)
Monocytes Relative: 1 %
Neutro Abs: 4.4 10*3/uL (ref 1.7–7.7)
Neutrophils Relative %: 76 %
Platelets: 149 10*3/uL — ABNORMAL LOW (ref 150–400)
RBC: 3.23 MIL/uL — ABNORMAL LOW (ref 3.87–5.11)
RDW: 13.8 % (ref 11.5–15.5)
WBC: 5.7 10*3/uL (ref 4.0–10.5)
nRBC: 0 % (ref 0.0–0.2)

## 2021-06-17 LAB — URINALYSIS, ROUTINE W REFLEX MICROSCOPIC
Bilirubin Urine: NEGATIVE
Glucose, UA: NEGATIVE mg/dL
Hgb urine dipstick: NEGATIVE
Ketones, ur: NEGATIVE mg/dL
Leukocytes,Ua: NEGATIVE
Nitrite: NEGATIVE
Protein, ur: NEGATIVE mg/dL
Specific Gravity, Urine: 1.01 (ref 1.005–1.030)
pH: 6.5 (ref 5.0–8.0)

## 2021-06-17 LAB — COMPREHENSIVE METABOLIC PANEL
ALT: 28 U/L (ref 0–44)
AST: 21 U/L (ref 15–41)
Albumin: 3.3 g/dL — ABNORMAL LOW (ref 3.5–5.0)
Alkaline Phosphatase: 34 U/L — ABNORMAL LOW (ref 38–126)
Anion gap: 4 — ABNORMAL LOW (ref 5–15)
BUN: 24 mg/dL — ABNORMAL HIGH (ref 8–23)
CO2: 32 mmol/L (ref 22–32)
Calcium: 8.4 mg/dL — ABNORMAL LOW (ref 8.9–10.3)
Chloride: 98 mmol/L (ref 98–111)
Creatinine, Ser: 0.71 mg/dL (ref 0.44–1.00)
GFR, Estimated: >60 mL/min (ref >60)
Glucose, Bld: 77 mg/dL (ref 70–99)
Potassium: 4.1 mmol/L (ref 3.5–5.1)
Sodium: 134 mmol/L — ABNORMAL LOW (ref 135–145)
Total Bilirubin: 0.8 mg/dL (ref 0.3–1.2)
Total Protein: 5.5 g/dL — ABNORMAL LOW (ref 6.5–8.1)

## 2021-06-17 LAB — LIPASE, BLOOD: Lipase: 26 U/L (ref 11–51)

## 2021-06-17 MED ORDER — SODIUM CHLORIDE 0.9 % IV BOLUS
1000.0000 mL | Freq: Once | INTRAVENOUS | Status: AC
  Administered 2021-06-17: 1000 mL via INTRAVENOUS

## 2021-06-17 MED ORDER — HEPARIN SOD (PORK) LOCK FLUSH 100 UNIT/ML IV SOLN
500.0000 [IU] | Freq: Once | INTRAVENOUS | Status: AC
Start: 2021-06-17 — End: 2021-06-17

## 2021-06-17 MED ORDER — DEXAMETHASONE 4 MG PO TABS: 4.0000 mg | ORAL_TABLET | Freq: Two times a day (BID) | ORAL | 0 refills | Status: DC

## 2021-06-17 MED ORDER — HEPARIN SOD (PORK) LOCK FLUSH 100 UNIT/ML IV SOLN
INTRAVENOUS | Status: AC
  Administered 2021-06-17: 500 [IU]
  Filled 2021-06-17: qty 5

## 2021-06-17 MED ORDER — DEXAMETHASONE SODIUM PHOSPHATE 10 MG/ML IJ SOLN
8.0000 mg | Freq: Once | INTRAMUSCULAR | Status: AC
  Administered 2021-06-17: 8 mg via INTRAVENOUS
  Filled 2021-06-17: qty 1

## 2021-06-17 NOTE — ED Provider Notes (Signed)
[facility] DEPT Provider Note   CSN: [id number] Arrival date & time: 06/17/21  1659     History  Chief Complaint  Patient presents with   Nausea   Diarrhea    Dawn Gomez is a 86 y.o. female.  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  She had previously completed a course of chemotherapy.  Recently was found to have increased spread of the disease.  The decision was made to restart chemotherapy.  She had her first sessions last week.  She said she is starting to have some effects of it with diarrhea and general fatigue.  She has urinary frequency and some increased heartburn.  She denies any nausea or vomiting.  No chest pain or shortness of breath.  No fevers.  She is on her baseline chronic oxygen of 4 L/min.      Home Medications Prior to Admission medications   Medication Sig Start Date End Date Taking? Authorizing Provider  acetaminophen (TYLENOL) 500 MG tablet Take 500 mg by mouth every 6 (six) hours as needed.    [provider]  Ascorbic Acid (VITAMIN C PO) Take by mouth.    [provider]  Azelastine-Fluticasone 137-50 MCG/ACT SUSP Place 1 spray into the nose every 12 (twelve) hours. 08/17/20   Biagio Borg, MD  Blood Glucose Monitoring Suppl (TRUE METRIX METER) w/Device KIT USE AS DIRECTED 06/22/20   Biagio Borg, MD  CALCIUM-MAGNESIUM-ZINC PO Take 1 tablet by mouth daily.    [provider]  cholecalciferol (VITAMIN D) 1000 units tablet Take 5,000 Units by mouth daily.    [provider]  diltiazem (CARDIZEM CD) 180 MG 24 hr capsule TAKE 1 CAPSULE EVERY DAY 03/06/21   Biagio Borg, MD  escitalopram (LEXAPRO) 20 MG tablet TAKE 1 TABLET AT BEDTIME 05/14/21   Biagio Borg, MD  fenofibrate 160 MG tablet TAKE 1 TABLET EVERY DAY 05/14/21   Biagio Borg, MD  fluocinonide ([place]) 0.05 % external solution APP EXT AA OF SCALP NIGHTLY 11/07/17    [provider]  furosemide (LASIX) 80 MG tablet TAKE 1 TABLET EVERY DAY 04/23/21   Biagio Borg, MD  glucose blood (TRUE METRIX BLOOD GLUCOSE TEST) test strip TEST  UP  TO FOUR TIMES DAILY AS DIRECTED 04/23/21   Biagio Borg, MD  guaiFENesin (MUCINEX) 600 MG 12 hr tablet Take 2 tablets (1,200 mg total) by mouth 2 (two) times daily as needed. 02/08/20   Biagio Borg, MD  ipratropium-albuterol (DUONEB) 0.5-2.5 (3) MG/3ML SOLN Take 3 mLs by nebulization every 4 (four) hours as needed. 08/10/16   Domenic Polite, MD  levothyroxine (SYNTHROID) 100 MCG tablet TAKE 1 TABLET EVERY DAY BEFORE BREAKFAST 10/06/20   Biagio Borg, MD  lidocaine-prilocaine (EMLA) cream 30 Squeeze  1/2 tsp on a cotton ball and apply to skin over port a cath site. Do not rub it in. Cover with plastic wrap. Apply 1-2 hours prior to your treatment. 11/23/20   Curt Bears, MD  lisinopril (ZESTRIL) 5 MG tablet TAKE 1 TABLET EVERY DAY 01/24/21   Biagio Borg, MD  potassium chloride SA (KLOR-CON M) 20 MEQ tablet TAKE 1 TABLET EVERY DAY WITH FOOD 04/23/21   Biagio Borg, MD  predniSONE (DELTASONE) 10 MG tablet 3 tabs by mouth per day for 3 days,2tabs per day for 3 days,1tab per day for 3 days 05/30/21  Biagio Borg, MD  prochlorperazine (COMPAZINE) 10 MG tablet Take 1 tablet (10 mg total) by mouth every 6 (six) hours as needed for nausea or vomiting. 08/31/20   Curt Bears, MD  rosuvastatin (CRESTOR) 20 MG tablet TAKE 1 TABLET EVERY DAY 05/14/21   Biagio Borg, MD  tiZANidine (ZANAFLEX) 2 MG tablet TAKE 1 TABLET THREE TIMES DAILY 05/14/21   Biagio Borg, MD  traMADol (ULTRAM) 50 MG tablet TAKE 1 TABLET BY MOUTH FOUR TIMES DAILY. 12/29/20   Biagio Borg, MD  traZODone (DESYREL) 50 MG tablet TAKE 1 TABLET (50 MG TOTAL) BY MOUTH AT BEDTIME AS NEEDED FOR SLEEP. 01/24/21   Biagio Borg, MD  TRUEplus Lancets 33G MISC TEST  UP  TO FOUR TIMES DAILY AS DIRECTED 04/23/21   Biagio Borg, MD  vitamin B-12 (CYANOCOBALAMIN) 1000 MCG  tablet Take 500 mcg by mouth daily.    [provider]  vitamin C (ASCORBIC ACID) 500 MG tablet Take 500 mg by mouth daily.    [provider]  Vitamin D, Ergocalciferol, (DRISDOL) 50000 units CAPS capsule Take 1 capsule (50,000 Units total) by mouth every 7 (seven) days. 04/10/17   Biagio Borg, MD  vitamin E 400 UNIT capsule Take 200 Units by mouth daily.    [provider]      Allergies    Lipitor [atorvastatin], Codeine, Hydromorphone hcl, Levaquin [levofloxacin in d5w], Morphine, Oxycodone-acetaminophen, Propoxyphene n-acetaminophen, and Simvastatin    Review of Systems   Review of Systems  Constitutional:  Positive for fatigue. Negative for chills, diaphoresis and fever.  HENT:  Negative for congestion, rhinorrhea and sneezing.   Eyes: Negative.   Respiratory:  Negative for cough, chest tightness and shortness of breath.   Cardiovascular:  Negative for chest pain and leg swelling.  Gastrointestinal:  Positive for diarrhea. Negative for abdominal pain, blood in stool, nausea and vomiting.  Genitourinary:  Positive for dysuria and frequency. Negative for difficulty urinating, flank pain and hematuria.  Musculoskeletal:  Negative for arthralgias and back pain.  Skin:  Negative for rash.  Neurological:  Positive for dizziness. Negative for speech difficulty, weakness, numbness and headaches.   Physical Exam Updated Vital Signs BP (!) 121/52    Pulse (!) 57    Temp 97.9 F (36.6 C) (Oral)    Resp (!) 22    SpO2 100%  Physical Exam Constitutional:      Appearance: She is well-developed.  HENT:     Head: Normocephalic and atraumatic.  Eyes:     Pupils: Pupils are equal, round, and reactive to light.  Cardiovascular:     Rate and Rhythm: Normal rate and regular rhythm.     Heart sounds: Normal heart sounds.  Pulmonary:     Effort: Pulmonary effort is normal. No respiratory distress.     Breath sounds: Normal breath sounds. No wheezing or rales.  Chest:      Chest wall: No tenderness.  Abdominal:     General: Bowel sounds are normal.     Palpations: Abdomen is soft.     Tenderness: There is no abdominal tenderness. There is no guarding or rebound.  Musculoskeletal:        General: Normal range of motion.     Cervical back: Normal range of motion and neck supple.  Lymphadenopathy:     Cervical: No cervical adenopathy.  Skin:    General: Skin is warm and dry.     Findings: No rash.  Neurological:  Mental Status: She is alert and oriented to person, place, and time.     Comments: Pt is oriented x3, but at times, has to stop and think of what she wants to say.  Does not seem to be a true aphasia.  She can name objects appropriately.  No slurred speech, visual fields full to confrontation, motor 5 out of 5 all extremities, sensation grossly intact to light touch all extremities, cranial nerves II through XII grossly intact    ED Results / Procedures / Treatments   Labs (all labs ordered are listed, but only abnormal results are displayed) Labs Reviewed  COMPREHENSIVE METABOLIC PANEL - Abnormal; Notable for the following components:      Result Value   Sodium 134 (*)    BUN 24 (*)    Calcium 8.4 (*)    Total Protein 5.5 (*)    Albumin 3.3 (*)    Alkaline Phosphatase 34 (*)    Anion gap 4 (*)    All other components within normal limits  CBC WITH DIFFERENTIAL/PLATELET - Abnormal; Notable for the following components:   RBC 3.23 (*)    Hemoglobin 9.9 (*)    HCT 30.8 (*)    Platelets 149 (*)    All other components within normal limits  LIPASE, BLOOD  URINALYSIS, ROUTINE W REFLEX MICROSCOPIC    EKG EKG Interpretation  Date/Time:  Sunday June 17 2021 18:17:37 EDT Ventricular Rate:  55 PR Interval:  129 QRS Duration: 86 QT Interval:  443 QTC Calculation: 424 R Axis:   33 Text Interpretation: Sinus rhythm Nonspecific T abnormalities, lateral leads since last tracing no significant change Confirmed by Malvin Johns  [phone]) on 06/17/2021 6:22:41 PM  Radiology CT Head Wo Contrast  Result Date: 06/17/2021 CLINICAL DATA:  Mental status changes of unknown cause, history coronary artery disease, emphysema, former smoker, small cell carcinoma of the LEFT lower lobe EXAM: CT HEAD WITHOUT CONTRAST TECHNIQUE: Contiguous axial images were obtained from the base of the skull through the vertex without intravenous contrast. RADIATION DOSE REDUCTION: This exam was performed according to the departmental dose-optimization program which includes automated exposure control, adjustment of the mA and/or kV according to patient size and/or use of iterative reconstruction technique. COMPARISON:  09/13/2020 FINDINGS: Brain: Postsurgical changes from LEFT frontotemporal craniotomy. Generalized atrophy. Normal ventricular morphology. New mass identified within the LEFT parieto-occipital lobe, 2.6 x 2.0 x 2.5 cm, abutting falx, favor CNS metastasis in this patient with a history of lung cancer. Adjacent vasogenic edema. Additional vasogenic edema identified in the RIGHT frontal lobe with new mass 1.7 x 1.5 x 0.9 cm suspect additional CNS metastasis with adjacent vasogenic edema. Small vessel chronic ischemic changes of deep cerebral white matter present. Cannot exclude additional subtle RIGHT cerebellar metastasis. 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 No additional mass, hemorrhage, or infarct. No extra-axial fluid collections. Vascular: Atherosclerotic calcification of internal carotid arteries at skull base. Aneurysm clips x2, terminal LEFT ICA and at anterior LEFT middle cranial fossa likely MCA. Skull: Post LEFT frontotemporal craniotomy and cranioplasty. Sinuses/Orbits: Small amount of fluid or mucus within LEFT sphenoid sinus Other: N/A IMPRESSION: Post craniotomy changes involving the LEFT frontotemporal region with 2 aneurysm clips at LEFT skull base. Two new CNS masses identified, 2.6 cm LEFT parieto-occipital and 1.7 cm RIGHT frontal likely  representing CNS metastases in this patient with a history of lung cancer; although meningiomas can present as mildly hyperdense masses these are new since 2022 and consider move unlikely in light of the  history of lung cancer. Cannot exclude additional subtle RIGHT cerebellar metastasis. Atrophy with small vessel chronic ischemic changes of deep cerebral white matter and old LEFT temporal lobe infarct. Findings called to Dr. Tamera Punt on 06/17/2021 at [facility] hours. Electronically Signed   By: Lavonia Dana M.D.   On: 06/17/2021 18:59    Procedures Procedures    Medications Ordered in ED Medications  sodium chloride 0.9 % bolus 1,000 mL (1,000 mLs Intravenous New Bag/Given 06/17/21 1823)  dexamethasone (DECADRON) injection 8 mg (8 mg Intravenous Given 06/17/21 1948)    ED Course/ Medical Decision Making/ A&P                           Medical Decision Making Problems Addressed: Brain metastasis [facility]): acute illness or injury that poses a threat to life or bodily functions Dehydration: acute illness or injury Diarrhea, unspecified type: acute illness or injury  Amount and/or Complexity of Data Reviewed Independent Historian:     Details: son External Data Reviewed: labs and notes. Labs: ordered. Decision-making details documented in ED Course. Radiology: ordered. Decision-making details documented in ED Course. ECG/medicine tests: ordered and independent interpretation performed. Decision-making details documented in ED Course.  Risk Prescription drug management. Decision regarding hospitalization.   Patient is a 86 year old female who is currently undergoing chemotherapy for small cell carcinoma of the lung.  She came in with symptoms which she felt were related to the chemotherapy including weakness, decreased appetite and diarrhea.  On exam, she does have some issues with her thought process.  She will hesitate and have to think about what she wants to say before she says that although she  does not have a distinct aphasia.  She has no other focal neurologic deficits.  Her son reports that she has had this same issue for a while and does not seem to be changed as far as he notices.  She did not seem to have symptoms of an acute stroke.  However because of this, head CT was performed which shows new brain lesions.  There is no midline shift per radiologist.  She had labs performed which were nonconcerning.  She has some anemia which seems to be similar to her baseline values.  She was given a liter of IV fluids and is feeling better after this.  I spoke with Dr. Burr Medico with oncology.  She feels that this point, there is no definite indication for hospitalization regarding the brain lesions.  She feels that this can be managed as an outpatient.  She recommends starting the patient on Decadron.  Patient was given a dose of IV Decadron in the ED and will start her on 4 mg twice daily per Dr. Ernestina Penna request.  A message was sent to Dr. Burr Medico and Dr. Earlie Server regarding the brain lesions and they cannot recommend further treatment from here.  Patient was discharged with the son who she lives with.  Return precautions were given.  Final Clinical Impression(s) / ED Diagnoses Final diagnoses:  Diarrhea, unspecified type  Dehydration  Brain metastasis [facility])    Rx / DC Orders ED Discharge Orders     None         Malvin Johns, MD 06/17/21 2016

## 2021-06-17 NOTE — ED Triage Notes (Addendum)
Pt presents with urinary frequency, increased belching, nausea, fatigue, and mild diarrhea after chemo treatment on 3/9. Pt denies abdominal pain, vomiting, chest pain, and shortness of breath. Pt is normally on 4L of O2. A&O x4. ?

## 2021-06-18 ENCOUNTER — Encounter: Payer: Self-pay | Admitting: Internal Medicine

## 2021-06-18 ENCOUNTER — Ambulatory Visit (INDEPENDENT_AMBULATORY_CARE_PROVIDER_SITE_OTHER): Payer: Medicare HMO | Admitting: Internal Medicine

## 2021-06-18 ENCOUNTER — Other Ambulatory Visit: Payer: Self-pay | Admitting: Internal Medicine

## 2021-06-18 ENCOUNTER — Other Ambulatory Visit: Payer: Self-pay | Admitting: *Deleted

## 2021-06-18 ENCOUNTER — Telehealth: Payer: Self-pay | Admitting: Radiation Oncology

## 2021-06-18 VITALS — BP 116/50 | HR 66 | Temp 98.3°F | Ht <= 58 in | Wt 155.0 lb

## 2021-06-18 DIAGNOSIS — E860 Dehydration: Secondary | ICD-10-CM | POA: Diagnosis not present

## 2021-06-18 DIAGNOSIS — C3432 Malignant neoplasm of lower lobe, left bronchus or lung: Secondary | ICD-10-CM

## 2021-06-18 DIAGNOSIS — E039 Hypothyroidism, unspecified: Secondary | ICD-10-CM

## 2021-06-18 DIAGNOSIS — R739 Hyperglycemia, unspecified: Secondary | ICD-10-CM | POA: Diagnosis not present

## 2021-06-18 MED ORDER — FUROSEMIDE 80 MG PO TABS: 40.0000 mg | ORAL_TABLET | Freq: Every day | ORAL | 3 refills | Status: AC | PRN

## 2021-06-18 NOTE — Telephone Encounter (Signed)
LVM with son per instructions on appt desk to schedule consult with Dr. Lisbeth Renshaw.  ?

## 2021-06-18 NOTE — Patient Instructions (Addendum)
Ok to take HALF the furosemide (lasix fluid pill)  - AS Needed only (and hold for the week of chemo) ? ?Please continue all other medications as before, and refills have been done if requested. ? ?Please have the pharmacy call with any other refills you may need. ? ?Please keep your appointments with your specialists as you may have planned ? ?Please make an Appointment to return in 3 months, or sooner if needed ? ? ?

## 2021-06-18 NOTE — Progress Notes (Signed)
Patient ID: Dawn Gomez, female   DOB: [birth year], 86 y.o.   MRN: [id number]        Chief Complaint: follow up recent ED visit with dehydration       HPI:  Dawn Gomez is a 86 y.o. female here with c/o reduced po intake with worsening metastatic disease, remains on lasix 80 qd and seen in ED yesterday with dehydration, improved with IVFs.  Today overall feeling much improved, Pt denies chest pain, increased sob or doe, wheezing, orthopnea, PND, increased LE swelling, palpitations, dizziness or syncope.   Pt denies polydipsia, polyuria, or new focal neuro s/s.   Pt denies fever, wt loss, night sweats, loss of appetite, or other constitutional symptoms  Denies hyper or hypo thyroid symptoms such as voice, skin or hair change.       Wt Readings from Last 3 Encounters:  06/18/21 155 lb (70.3 kg)  06/12/21 152 lb 8 oz (69.2 kg)  06/05/21 151 lb 9.6 oz (68.8 kg)   BP Readings from Last 3 Encounters:  06/18/21 (!) 116/50  06/17/21 (!) 125/51  06/14/21 (!) 114/43         Past Medical History:  Diagnosis Date   Abnormal heart rhythm    Aortic aneurysm (HCC)    Aortic atherosclerosis ([place]) 03/03/2019   Cancer ([place])    Coronary artery calcification seen on CT scan 12/18/2020   Dyspnea    Hypothyroid    OSA (obstructive sleep apnea)    on cpap   Psoriasis 02/05/2017   Pulmonary fibrosis (HCC)    SVT (supraventricular tachycardia) (HCC)    Thyroid disease    hypo   Past Surgical History:  Procedure Laterality Date   ABDOMINAL HYSTERECTOMY     APPENDECTOMY     CHOLECYSTECTOMY     CRANIOTOMY     for aneurysms   CRANIOTOMY  1980 x 2   aneurysmal clipping   HERNIA REPAIR     IR IMAGING GUIDED PORT INSERTION  09/15/2020   TOTAL ABDOMINAL HYSTERECTOMY     TUBAL LIGATION      reports that she quit smoking about 15 years ago. Her smoking use included cigarettes. She has a 100.00 pack-year smoking history. She has never used smokeless tobacco. She reports current alcohol use.  She reports that she does not use drugs. family history includes Allergies in her sister; Bone cancer in her father; Breast cancer in her sister; Cancer in her sister; Clotting disorder in her father and sister; Emphysema in her father, mother, and sister; Heart disease in her father and mother; Lung cancer in her father and mother; Prostate cancer in her father; Rheum arthritis in her father, mother, and sister. Allergies  Allergen Reactions   Lipitor [Atorvastatin] Shortness Of Breath, Diarrhea and Other (See Comments)    Dizzy, pain all over.   Codeine    Hydromorphone Hcl     REACTION: dementia   Levaquin [Levofloxacin In D5w]     Shock per pt but can take cipro   Morphine    Oxycodone-Acetaminophen    Propoxyphene N-Acetaminophen    Simvastatin    Current Outpatient Medications on File Prior to Visit  Medication Sig Dispense Refill   acetaminophen (TYLENOL) 500 MG tablet Take 500 mg by mouth every 6 (six) hours as needed.     Ascorbic Acid (VITAMIN C PO) Take by mouth.     Azelastine-Fluticasone 137-50 MCG/ACT SUSP Place 1 spray into the nose every 12 (twelve) hours. 23 g 5  Blood Glucose Monitoring Suppl (TRUE METRIX METER) w/Device KIT USE AS DIRECTED 1 kit 0   CALCIUM-MAGNESIUM-ZINC PO Take 1 tablet by mouth daily.     cholecalciferol (VITAMIN D) 1000 units tablet Take 5,000 Units by mouth daily.     dexamethasone (DECADRON) 4 MG tablet Take 1 tablet (4 mg total) by mouth 2 (two) times daily with a meal. 60 tablet 0   diltiazem (CARDIZEM CD) 180 MG 24 hr capsule TAKE 1 CAPSULE EVERY DAY 90 capsule 1   escitalopram (LEXAPRO) 20 MG tablet TAKE 1 TABLET AT BEDTIME 90 tablet 2   fenofibrate 160 MG tablet TAKE 1 TABLET EVERY DAY 90 tablet 1   fluocinonide ([place]) 0.05 % external solution APP EXT AA OF SCALP NIGHTLY  3   glucose blood (TRUE METRIX BLOOD GLUCOSE TEST) test strip TEST  UP  TO FOUR TIMES DAILY AS DIRECTED 100 strip 3   guaiFENesin (MUCINEX) 600 MG 12 hr tablet Take 2  tablets (1,200 mg total) by mouth 2 (two) times daily as needed. 60 tablet 2   ipratropium-albuterol (DUONEB) 0.5-2.5 (3) MG/3ML SOLN Take 3 mLs by nebulization every 4 (four) hours as needed. 360 mL 0   levothyroxine (SYNTHROID) 100 MCG tablet TAKE 1 TABLET EVERY DAY BEFORE BREAKFAST 90 tablet 2   lidocaine-prilocaine (EMLA) cream 30 Squeeze  1/2 tsp on a cotton ball and apply to skin over port a cath site. Do not rub it in. Cover with plastic wrap. Apply 1-2 hours prior to your treatment. 30 g 0   lisinopril (ZESTRIL) 5 MG tablet TAKE 1 TABLET EVERY DAY 90 tablet 3   potassium chloride SA (KLOR-CON M) 20 MEQ tablet TAKE 1 TABLET EVERY DAY WITH FOOD 90 tablet 1   prochlorperazine (COMPAZINE) 10 MG tablet Take 1 tablet (10 mg total) by mouth every 6 (six) hours as needed for nausea or vomiting. 30 tablet 0   rosuvastatin (CRESTOR) 20 MG tablet TAKE 1 TABLET EVERY DAY 90 tablet 2   tiZANidine (ZANAFLEX) 2 MG tablet TAKE 1 TABLET THREE TIMES DAILY 270 tablet 1   traMADol (ULTRAM) 50 MG tablet TAKE 1 TABLET BY MOUTH FOUR TIMES DAILY. 360 tablet 1   traZODone (DESYREL) 50 MG tablet TAKE 1 TABLET (50 MG TOTAL) BY MOUTH AT BEDTIME AS NEEDED FOR SLEEP. 90 tablet 1   TRUEplus Lancets 33G MISC TEST  UP  TO FOUR TIMES DAILY AS DIRECTED 100 each 3   vitamin B-12 (CYANOCOBALAMIN) 1000 MCG tablet Take 500 mcg by mouth daily.     vitamin C (ASCORBIC ACID) 500 MG tablet Take 500 mg by mouth daily.     Vitamin D, Ergocalciferol, (DRISDOL) 50000 units CAPS capsule Take 1 capsule (50,000 Units total) by mouth every 7 (seven) days. 12 capsule 0   vitamin E 400 UNIT capsule Take 200 Units by mouth daily.     No current facility-administered medications on file prior to visit.        ROS:  All others reviewed and negative.  Objective        PE:  BP (!) 116/50 (BP Location: Left Arm, Patient Position: Sitting, Cuff Size: Large)    Pulse 66    Temp 98.3 F (36.8 C) (Oral)    Ht _0  (1.473 m)    Wt 155 lb (70.3  kg)    SpO2 100%    BMI 32.40 kg/m                 Constitutional: Pt  appears in NAD               HENT: Head: NCAT.                Right Ear: External ear normal.                 Left Ear: External ear normal.                Eyes: . Pupils are equal, round, and reactive to light. Conjunctivae and EOM are normal               Nose: without d/c or deformity               Neck: Neck supple. Gross normal ROM               Cardiovascular: Normal rate and regular rhythm.                 Pulmonary/Chest: Effort normal and breath sounds without rales or wheezing.                Abd:  Soft, NT, ND, + BS, no organomegaly               Neurological: Pt is alert. At baseline orientation, motor grossly intact               Skin: Skin is warm. No rashes, no other new lesions, LE edema - none               Psychiatric: Pt behavior is normal without agitation   Micro: none  Cardiac tracings I have personally interpreted today:  none  Pertinent Radiological findings (summarize): none   Lab Results  Component Value Date   WBC 5.7 06/17/2021   HGB 9.9 (L) 06/17/2021   HCT 30.8 (L) 06/17/2021   PLT 149 (L) 06/17/2021   GLUCOSE 77 06/17/2021   CHOL 115 08/17/2020   TRIG 102.0 08/17/2020   HDL 49.80 08/17/2020   LDLDIRECT 138.0 02/05/2017   LDLCALC 44 08/17/2020   ALT 28 06/17/2021   AST 21 06/17/2021   NA 134 (L) 06/17/2021   K 4.1 06/17/2021   CL 98 06/17/2021   CREATININE 0.71 06/17/2021   BUN 24 (H) 06/17/2021   CO2 32 06/17/2021   TSH 1.05 08/17/2020   INR 1.1 08/29/2020   HGBA1C 5.4 08/17/2020   Assessment/Plan:  Dawn Gomez is a 86 y.o. White or Caucasian [1] female with  has a past medical history of Abnormal heart rhythm, Aortic aneurysm ([place]), Aortic atherosclerosis ([place]) (03/03/2019), Cancer [facility]), Coronary artery calcification seen on CT scan (12/18/2020), Dyspnea, Hypothyroid, OSA (obstructive sleep apnea), Psoriasis (02/05/2017), Pulmonary fibrosis ([place]), SVT  (supraventricular tachycardia) ([place]), and Thyroid disease.  Hyperglycemia Lab Results  Component Value Date   HGBA1C 5.4 08/17/2020   Stable, pt to continue current medical treatment  - diet   Hypothyroidism Lab Results  Component Value Date   TSH 1.05 08/17/2020   Stable, pt to continue levothyroxine   Dehydration Symptomatically improved; has not taken lasix 80 today - ok to hold today, then restart at 40 qd except for the week of chemo treatments,  to f/u any worsening symptoms or concerns  Followup: Return in about 3 months (around 09/18/2021).  Cathlean Cower, MD 06/18/2021 5:16 PM [facility]

## 2021-06-18 NOTE — Assessment & Plan Note (Signed)
Lab Results  ?Component Value Date  ? TSH 1.05 08/17/2020  ? ?Stable, pt to continue levothyroxine ? ?

## 2021-06-18 NOTE — Assessment & Plan Note (Signed)
Lab Results  ?Component Value Date  ? HGBA1C 5.4 08/17/2020  ? ?Stable, pt to continue current medical treatment  - diet ? ?

## 2021-06-18 NOTE — Assessment & Plan Note (Signed)
Symptomatically improved; has not taken lasix 80 today - ok to hold today, then restart at 40 qd except for the week of chemo treatments,  to f/u any worsening symptoms or concerns ?

## 2021-06-19 ENCOUNTER — Inpatient Hospital Stay (HOSPITAL_BASED_OUTPATIENT_CLINIC_OR_DEPARTMENT_OTHER): Payer: Medicare HMO | Admitting: Physician Assistant

## 2021-06-19 ENCOUNTER — Encounter: Payer: Self-pay | Admitting: Physician Assistant

## 2021-06-19 ENCOUNTER — Inpatient Hospital Stay: Payer: Medicare HMO

## 2021-06-19 ENCOUNTER — Other Ambulatory Visit: Payer: Self-pay

## 2021-06-19 VITALS — BP 109/45 | HR 57 | Temp 98.3°F | Resp 19 | Ht <= 58 in | Wt 155.5 lb

## 2021-06-19 DIAGNOSIS — Z5111 Encounter for antineoplastic chemotherapy: Secondary | ICD-10-CM | POA: Diagnosis not present

## 2021-06-19 DIAGNOSIS — Z95828 Presence of other vascular implants and grafts: Secondary | ICD-10-CM

## 2021-06-19 DIAGNOSIS — C787 Secondary malignant neoplasm of liver and intrahepatic bile duct: Secondary | ICD-10-CM | POA: Diagnosis not present

## 2021-06-19 DIAGNOSIS — Z510 Encounter for antineoplastic radiation therapy: Secondary | ICD-10-CM | POA: Diagnosis not present

## 2021-06-19 DIAGNOSIS — C7931 Secondary malignant neoplasm of brain: Secondary | ICD-10-CM | POA: Insufficient documentation

## 2021-06-19 DIAGNOSIS — E119 Type 2 diabetes mellitus without complications: Secondary | ICD-10-CM | POA: Diagnosis not present

## 2021-06-19 DIAGNOSIS — Z79899 Other long term (current) drug therapy: Secondary | ICD-10-CM | POA: Diagnosis not present

## 2021-06-19 DIAGNOSIS — C3432 Malignant neoplasm of lower lobe, left bronchus or lung: Secondary | ICD-10-CM

## 2021-06-19 LAB — CBC WITH DIFFERENTIAL (CANCER CENTER ONLY)
Abs Immature Granulocytes: 0.05 10*3/uL (ref 0.00–0.07)
Basophils Absolute: 0 10*3/uL (ref 0.0–0.1)
Basophils Relative: 0 %
Eosinophils Absolute: 0.1 10*3/uL (ref 0.0–0.5)
Eosinophils Relative: 1 %
HCT: 26.9 % — ABNORMAL LOW (ref 36.0–46.0)
Hemoglobin: 8.8 g/dL — ABNORMAL LOW (ref 12.0–15.0)
Immature Granulocytes: 1 %
Lymphocytes Relative: 27 %
Lymphs Abs: 1.3 10*3/uL (ref 0.7–4.0)
MCH: 30.4 pg (ref 26.0–34.0)
MCHC: 32.7 g/dL (ref 30.0–36.0)
MCV: 93.1 fL (ref 80.0–100.0)
Monocytes Absolute: 0.1 10*3/uL (ref 0.1–1.0)
Monocytes Relative: 2 %
Neutro Abs: 3.3 10*3/uL (ref 1.7–7.7)
Neutrophils Relative %: 69 %
Platelet Count: 133 10*3/uL — ABNORMAL LOW (ref 150–400)
RBC: 2.89 MIL/uL — ABNORMAL LOW (ref 3.87–5.11)
RDW: 13.4 % (ref 11.5–15.5)
WBC Count: 4.7 10*3/uL (ref 4.0–10.5)
nRBC: 0 % (ref 0.0–0.2)

## 2021-06-19 LAB — CMP (CANCER CENTER ONLY)
ALT: 19 U/L (ref 0–44)
AST: 13 U/L — ABNORMAL LOW (ref 15–41)
Albumin: 3.2 g/dL — ABNORMAL LOW (ref 3.5–5.0)
Alkaline Phosphatase: 45 U/L (ref 38–126)
Anion gap: 4 — ABNORMAL LOW (ref 5–15)
BUN: 29 mg/dL — ABNORMAL HIGH (ref 8–23)
CO2: 31 mmol/L (ref 22–32)
Calcium: 8.5 mg/dL — ABNORMAL LOW (ref 8.9–10.3)
Chloride: 103 mmol/L (ref 98–111)
Creatinine: 0.71 mg/dL (ref 0.44–1.00)
GFR, Estimated: >60 mL/min (ref >60)
Glucose, Bld: 80 mg/dL (ref 70–99)
Potassium: 4 mmol/L (ref 3.5–5.1)
Sodium: 138 mmol/L (ref 135–145)
Total Bilirubin: 0.3 mg/dL (ref 0.3–1.2)
Total Protein: 5 g/dL — ABNORMAL LOW (ref 6.5–8.1)

## 2021-06-19 MED ORDER — HEPARIN SOD (PORK) LOCK FLUSH 100 UNIT/ML IV SOLN
500.0000 [IU] | Freq: Once | INTRAVENOUS | Status: AC
  Administered 2021-06-19: 500 [IU]

## 2021-06-19 MED ORDER — SODIUM CHLORIDE 0.9% FLUSH
10.0000 mL | Freq: Once | INTRAVENOUS | Status: AC
  Administered 2021-06-19: 10 mL

## 2021-06-19 NOTE — Patient Instructions (Signed)
-  Please make sure to continue taking the decadron twice a day. You are likely having headaches due to the swelling in the brain. Please continue to take tylenol if needed.  ?-If you get signs and symptoms of high blood sugar, please check your blood sugar. If it is high, please call us for further instructions. Or if very high and signs and symptoms including confusion, weakness, increased urination, etc, please seek emergency evaluation ?-I have constipation education listed below. Please start taking a stool softener.  ?-We will delay your next treatment by 1 week (3 weeks from now). Please note your schedule will change. However, we still need to check your labs closely every week. We will need to monitor to make sure that you do not need a blood transfusion.  ?-Please make sure to keep your appointment with radiation oncology this Friday as scheduled.  ? ? ?It is common for patients who are undergoing treatment and taking certain prescribed medications to experience side-effects with constipation. ? ?If you experience constipation, please take stool softener such as Colace or Senna one tablet twice a day everyday to avoid constipation.  These medications are available over the counter.  Of course, if you have diarrhea, stop taking stool softeners.  Drinking plenty of fluid, eating fruits and vegetable, and being active also reduces the risk of constipation.  ? ?If despite taking stool softeners, and you still have no bowel movement for 2 days or more than your normal bowel habit frequency, please take one of the following over the counter laxatives:  MiraLax, Milk of Magnesia or Mag Citrate everyday. The goal is to have at least one bowel movement every other day.  ? ?

## 2021-06-19 NOTE — Progress Notes (Signed)
Location/Histology of Brain Tumor: Left Parieto-Occipital lesion, and right frontal lesion. ? ?Small Cell Lung Cancer- Left lower lobe, right hilar and mediastinal lymphadenopathy, satellite nodule in the left lower lobe and peripheral pleural lesion as well as metastatic liver lesions diagnosed in May 2022. ? ?Patient presented to the ER on 06/17/2021 with complaints of nausea and diarrhea.  She was noted by the ED physician to be forgetful and would hesitate prior to answering questions.  Imaging was obtained. ? ?CT Head 06/17/2021: Post craniotomy changes involving the left frontotemporal region with 2 aneurysm clips at left skull base.  Two new CNS masses identified, 2.6 cm left parieto-occipital and 1.7 cm right frontal likely representing CNS metastases in this patient with a history of lung cancer. ? ? ?Past or anticipated interventions, if any, per neurosurgery:  ? ? ?Past or anticipated interventions, if any, per medical oncology:  ?Cassie Heilingoetter / Dr. Julien Nordmann 06/19/2021 ?-She first underwent systemic chemotherapy with carboplatin for an AC of 5 on day 1, etoposide 100 mg per metered squared on days 1, 2, and 3 with Cosela before the chemotherapy on day 1.  She is status post 4 cycles.  She is not a good candidate for immunotherapy due to her history of psoriasis.  She tolerated treatment well and had a good response to treatment. ?-She had been on observation for 6 months until a restaging scan showed evidence of disease progression with enlarging subpleural mass in the superior segment of the left lower lobe and new subcentimeter satellite nodules as well as a new and enlarging liver lesions. ?-Therefore, she restarted systemic chemotherapy with carboplatin on day 1 and etoposide on days 1, 2, and 3.  She also has Cosela for myelo protection.  She underwent her first cycle of treatment last week and tolerated it fairly well.  ?-The patient was seen with Dr. Julien Nordmann today.  Labs were reviewed.  Dr.  Julien Nordmann recommends delaying her next cycle of treatment by an additional week to allow time for her to complete her brain radiation. ? ? ? ?Dose of Decadron, if applicable: 4mg  BID, will start today. ? ?Recent neurologic symptoms, if any:  ?Seizures: No ?Headaches: She reports daily headaches, worse on the right side. ?Nausea: No ?Dizziness/ataxia: She reports occasional dizziness. ?Difficulty with hand coordination: Denies changes. ?Focal numbness/weakness: She reports occasional numbness in the last two fingers on her left hand. ?Visual deficits/changes: She reports right eye soreness.  She reports occasional double vision unable to tell which eye.  She reports decreased peripheral vision on the right side. ?Confusion/Memory deficits: She has noticed some confusion on occasion. ? ?Signs/Symptoms ?Weight changes, if any: Stable ?Respiratory complaints, if any: She has SOB with increased activities. ?Hemoptysis, if any: She reports productive cough with yellow thick mucus. ?Pain issues, if any:  She reports pain in her head, liver, stomach, and lung. ? ? ?SAFETY ISSUES: ?Prior radiation? No ?Pacemaker/ICD? No ?Possible current pregnancy? Hysterectomy ?Is the patient on methotrexate? No ? ?Additional Complaints / other details:  ?On Oxygen 4 Liters ? ? ?

## 2021-06-20 ENCOUNTER — Other Ambulatory Visit: Payer: Self-pay

## 2021-06-20 DIAGNOSIS — C3432 Malignant neoplasm of lower lobe, left bronchus or lung: Secondary | ICD-10-CM

## 2021-06-20 MED ORDER — DEXAMETHASONE 4 MG PO TABS: 4.0000 mg | ORAL_TABLET | Freq: Two times a day (BID) | ORAL | 0 refills | Status: AC

## 2021-06-21 ENCOUNTER — Ambulatory Visit
Admission: RE | Admit: 2021-06-21 | Discharge: 2021-06-21 | Disposition: A | Discharge status: Home / Self Care | Payer: Medicare HMO | Source: Ambulatory Visit | Attending: Radiation Oncology | Admitting: Radiation Oncology

## 2021-06-21 ENCOUNTER — Encounter: Payer: Self-pay | Admitting: Radiation Oncology

## 2021-06-21 ENCOUNTER — Other Ambulatory Visit: Payer: Self-pay

## 2021-06-21 VITALS — BP 110/45 | HR 66 | Temp 97.0°F | Resp 18 | Ht <= 58 in | Wt 153.5 lb

## 2021-06-21 DIAGNOSIS — C7931 Secondary malignant neoplasm of brain: Secondary | ICD-10-CM | POA: Diagnosis not present

## 2021-06-21 DIAGNOSIS — E039 Hypothyroidism, unspecified: Secondary | ICD-10-CM | POA: Diagnosis not present

## 2021-06-21 DIAGNOSIS — G4733 Obstructive sleep apnea (adult) (pediatric): Secondary | ICD-10-CM | POA: Insufficient documentation

## 2021-06-21 DIAGNOSIS — C3432 Malignant neoplasm of lower lobe, left bronchus or lung: Secondary | ICD-10-CM | POA: Diagnosis not present

## 2021-06-21 DIAGNOSIS — I700 Atherosclerosis of aorta: Secondary | ICD-10-CM | POA: Insufficient documentation

## 2021-06-21 DIAGNOSIS — I2510 Atherosclerotic heart disease of native coronary artery without angina pectoris: Secondary | ICD-10-CM | POA: Diagnosis not present

## 2021-06-21 DIAGNOSIS — Z8042 Family history of malignant neoplasm of prostate: Secondary | ICD-10-CM | POA: Insufficient documentation

## 2021-06-21 DIAGNOSIS — Z79899 Other long term (current) drug therapy: Secondary | ICD-10-CM | POA: Diagnosis not present

## 2021-06-21 DIAGNOSIS — J432 Centrilobular emphysema: Secondary | ICD-10-CM | POA: Diagnosis not present

## 2021-06-21 DIAGNOSIS — K439 Ventral hernia without obstruction or gangrene: Secondary | ICD-10-CM | POA: Diagnosis not present

## 2021-06-21 DIAGNOSIS — Z87891 Personal history of nicotine dependence: Secondary | ICD-10-CM | POA: Diagnosis not present

## 2021-06-21 MED ORDER — MEMANTINE HCL 5 MG PO TABS: ORAL_TABLET | ORAL | 0 refills | Status: AC

## 2021-06-21 MED ORDER — MEMANTINE HCL 10 MG PO TABS: 10.0000 mg | ORAL_TABLET | Freq: Two times a day (BID) | ORAL | 4 refills | Status: AC

## 2021-06-21 NOTE — Progress Notes (Signed)
?Radiation Oncology         [phone] ?________________________________ ? ?Name: Dawn Gomez        MRN: [id number]  ?Date of Service: 06/21/2021 DOB: October 02, 1935 ? ?LM:BEML, Hunt Oris, MD  Curt Bears, MD    ? ?REFERRING PHYSICIAN: Curt Bears, MD ? ? ?DIAGNOSIS: The primary encounter diagnosis was Brain metastases ([place]). A diagnosis of Small cell carcinoma of lower lobe of left lung (HCC) was also pertinent to this visit. ? ? ?HISTORY OF PRESENT ILLNESS: Dawn Gomez is a 86 y.o. female seen at the request of Dr. Julien Nordmann for history of extensive stage small cell carcinoma of the left lower lobe. She was originally diagnosed in May 2022 with cancer in the left lower lobe, pleural disease and metastatic liver disease, she began systemic chemotherapy on 09/18/2020 and completed 4 cycles on 11/20/2020, she did not receive immunotherapy because of a history of extensive psoriasis.  She was being followed in surveillance and progressive disease was noted in the spring 2023.  She went on second line chemotherapy beginning 06/12/2021.  She was seen in the ER on 06/17/2021 with nausea and diarrhea and did not recall going to the ER.  The patient was forgetful and hesitated to answer questions so a CT of the head with out contrast was performed and showed a 2.6 cm left parietal occipital lobe lesion with associated vasogenic edema and a right frontal lobe lesion measuring 1.7 cm.  This was limited by lack of contrast and she cannot have MRI scans due to aneurysm clips that she had placed in the 1980s.  She was prescribed dexamethasone 4 mg twice a day but has not started the prescription.  She is seen today to discuss whole brain radiation. ? ? ? ?PREVIOUS RADIATION THERAPY: No ? ? ?PAST MEDICAL HISTORY:  ?Past Medical History:  ?Diagnosis Date  ? Abnormal heart rhythm   ? Aortic aneurysm ([place])   ? Aortic atherosclerosis ([place]) 03/03/2019  ? Cancer [facility])   ? Coronary artery calcification seen on CT scan  12/18/2020  ? Dyspnea   ? Hypothyroid   ? OSA (obstructive sleep apnea)   ? on cpap  ? Psoriasis 02/05/2017  ? Pulmonary fibrosis ([place])   ? SVT (supraventricular tachycardia) ([place])   ? Thyroid disease   ? hypo  ?   ? ? ?PAST SURGICAL HISTORY: ?Past Surgical History:  ?Procedure Laterality Date  ? ABDOMINAL HYSTERECTOMY    ? APPENDECTOMY    ? CHOLECYSTECTOMY    ? CRANIOTOMY    ? for aneurysms  ? CRANIOTOMY  1980 x 2  ? aneurysmal clipping  ? HERNIA REPAIR    ? IR IMAGING GUIDED PORT INSERTION  09/15/2020  ? TOTAL ABDOMINAL HYSTERECTOMY    ? TUBAL LIGATION    ? ? ? ?FAMILY HISTORY:  ?Family History  ?Problem Relation Age of Onset  ? Emphysema Father   ? Heart disease Father   ? Clotting disorder Father   ? Rheum arthritis Father   ? Lung cancer Father   ? Prostate cancer Father   ? Bone cancer Father   ? Emphysema Sister   ? Emphysema Mother   ? Heart disease Mother   ? Rheum arthritis Mother   ? Lung cancer Mother   ? Allergies Sister   ? Clotting disorder Sister   ? Rheum arthritis Sister   ? Breast cancer Sister   ? Cancer Sister   ?     ureter  ? ? ? ?  SOCIAL HISTORY:  reports that she quit smoking about 15 years ago. Her smoking use included cigarettes. She has a 100.00 pack-year smoking history. She has never used smokeless tobacco. She reports current alcohol use. She reports that she does not use drugs. ? ? ?ALLERGIES: Lipitor [atorvastatin], Codeine, Hydromorphone hcl, Levaquin [levofloxacin in d5w], Morphine, Oxycodone-acetaminophen, Propoxyphene n-acetaminophen, and Simvastatin ? ? ?MEDICATIONS:  ?Current Outpatient Medications  ?Medication Sig Dispense Refill  ? acetaminophen (TYLENOL) 500 MG tablet Take 500 mg by mouth every 6 (six) hours as needed.    ? Ascorbic Acid (VITAMIN C PO) Take by mouth.    ? Azelastine-Fluticasone 137-50 MCG/ACT SUSP Place 1 spray into the nose every 12 (twelve) hours. 23 g 5  ? Blood Glucose Monitoring Suppl (TRUE METRIX METER) w/Device KIT USE AS DIRECTED 1 kit 0  ?  CALCIUM-MAGNESIUM-ZINC PO Take 1 tablet by mouth daily.    ? cholecalciferol (VITAMIN D) 1000 units tablet Take 5,000 Units by mouth daily.    ? dexamethasone (DECADRON) 4 MG tablet Take 1 tablet (4 mg total) by mouth 2 (two) times daily with a meal. 60 tablet 0  ? diltiazem (CARDIZEM CD) 180 MG 24 hr capsule TAKE 1 CAPSULE EVERY DAY 90 capsule 1  ? escitalopram (LEXAPRO) 20 MG tablet TAKE 1 TABLET AT BEDTIME 90 tablet 2  ? fenofibrate 160 MG tablet TAKE 1 TABLET EVERY DAY 90 tablet 1  ? fluocinonide (LIDEX) 0.05 % external solution APP EXT AA OF SCALP NIGHTLY  3  ? furosemide (LASIX) 80 MG tablet Take 0.5 tablets (40 mg total) by mouth daily as needed. 90 tablet 3  ? glucose blood (TRUE METRIX BLOOD GLUCOSE TEST) test strip TEST  UP  TO FOUR TIMES DAILY AS DIRECTED 100 strip 3  ? guaiFENesin (MUCINEX) 600 MG 12 hr tablet Take 2 tablets (1,200 mg total) by mouth 2 (two) times daily as needed. 60 tablet 2  ? ipratropium-albuterol (DUONEB) 0.5-2.5 (3) MG/3ML SOLN Take 3 mLs by nebulization every 4 (four) hours as needed. 360 mL 0  ? levothyroxine (SYNTHROID) 100 MCG tablet TAKE 1 TABLET EVERY DAY BEFORE BREAKFAST 90 tablet 2  ? lidocaine-prilocaine (EMLA) cream 30 ?Squeeze  1/2 tsp on a cotton ball and apply to skin over port a cath site. Do not rub it in. Cover with plastic wrap. ?Apply 1-2 hours prior to your treatment. 30 g 0  ? lisinopril (ZESTRIL) 5 MG tablet TAKE 1 TABLET EVERY DAY 90 tablet 3  ? memantine (NAMENDA) 10 MG tablet Take 1 tablet (10 mg total) by mouth 2 (two) times daily. 60 tablet 4  ? memantine (NAMENDA) 5 MG tablet Begin this prescription the first day of brain radiation. Week 1: take one tablet po qam. Week 2: take one tablet qam and qpm. Week 3: take two tablets qam, and one tablet po q pm. Week 4: take two tablets qam and qpm. Fill subsequent prescription q month. 70 tablet 0  ? potassium chloride SA (KLOR-CON M) 20 MEQ tablet TAKE 1 TABLET EVERY DAY WITH FOOD 90 tablet 1  ? prochlorperazine  (COMPAZINE) 10 MG tablet Take 1 tablet (10 mg total) by mouth every 6 (six) hours as needed for nausea or vomiting. 30 tablet 0  ? rosuvastatin (CRESTOR) 20 MG tablet TAKE 1 TABLET EVERY DAY 90 tablet 2  ? tiZANidine (ZANAFLEX) 2 MG tablet TAKE 1 TABLET THREE TIMES DAILY 270 tablet 1  ? traMADol (ULTRAM) 50 MG tablet TAKE 1 TABLET BY MOUTH FOUR TIMES  DAILY. 360 tablet 1  ? traZODone (DESYREL) 50 MG tablet TAKE 1 TABLET (50 MG TOTAL) BY MOUTH AT BEDTIME AS NEEDED FOR SLEEP. 90 tablet 1  ? TRUEplus Lancets 33G MISC TEST  UP  TO FOUR TIMES DAILY AS DIRECTED 100 each 3  ? vitamin B-12 (CYANOCOBALAMIN) 1000 MCG tablet Take 500 mcg by mouth daily.    ? vitamin C (ASCORBIC ACID) 500 MG tablet Take 500 mg by mouth daily.    ? Vitamin D, Ergocalciferol, (DRISDOL) 50000 units CAPS capsule Take 1 capsule (50,000 Units total) by mouth every 7 (seven) days. 12 capsule 0  ? vitamin E 400 UNIT capsule Take 200 Units by mouth daily.    ? ?No current facility-administered medications for this encounter.  ? ? ? ?REVIEW OF SYSTEMS: On review of systems, the patient reports that she is doing okay.  She has had some confusion at times, she also describes double vision but is unable to localize which eye that she notes this in, she is also having decreased peripheral vision to the right, she describes shortness of breath with exertion as well as productive yellow mucus with cough.  She does have pain in the abdomen as well as when she takes deep breaths in the chest wall.  She is typically using 4 L of oxygen at home continuously.  She describes weakness more on the right side but denies any falls, uncontrolled gait or seizure activity.  No other complaints are verbalized. ?  ? ?PHYSICAL EXAM:  ?Wt Readings from Last 3 Encounters:  ?06/21/21 153 lb 8 oz (69.6 kg)  ?06/19/21 155 lb 8 oz (70.5 kg)  ?06/18/21 155 lb (70.3 kg)  ? ?Temp Readings from Last 3 Encounters:  ?06/21/21 (!) 97 ?F (36.1 ?C) (Temporal)  ?06/19/21 98.3 ?F (36.8 ?C)  (Oral)  ?06/18/21 98.3 ?F (36.8 ?C) (Oral)  ? ?BP Readings from Last 3 Encounters:  ?06/21/21 (!) 110/45  ?06/19/21 (!) 109/45  ?06/18/21 (!) 116/50  ? ?Pulse Readings from Last 3 Encounters:  ?06/21/21 66  ?03/14/2

## 2021-06-21 NOTE — Addendum Note (Signed)
Encounter addended by: Hayden Pedro, PA-C on: 06/21/2021 11:26 AM ? Actions taken: Clinical Note Signed, Level of Service modified

## 2021-06-22 ENCOUNTER — Ambulatory Visit
Admission: RE | Admit: 2021-06-22 | Discharge: 2021-06-22 | Disposition: A | Discharge status: Home / Self Care | Payer: Medicare HMO | Source: Ambulatory Visit | Attending: Radiation Oncology | Admitting: Radiation Oncology

## 2021-06-22 DIAGNOSIS — C7931 Secondary malignant neoplasm of brain: Secondary | ICD-10-CM | POA: Diagnosis not present

## 2021-06-22 DIAGNOSIS — E119 Type 2 diabetes mellitus without complications: Secondary | ICD-10-CM | POA: Diagnosis not present

## 2021-06-22 DIAGNOSIS — Z510 Encounter for antineoplastic radiation therapy: Secondary | ICD-10-CM | POA: Diagnosis not present

## 2021-06-22 DIAGNOSIS — Z5111 Encounter for antineoplastic chemotherapy: Secondary | ICD-10-CM | POA: Diagnosis not present

## 2021-06-22 DIAGNOSIS — Z79899 Other long term (current) drug therapy: Secondary | ICD-10-CM | POA: Diagnosis not present

## 2021-06-22 DIAGNOSIS — C787 Secondary malignant neoplasm of liver and intrahepatic bile duct: Secondary | ICD-10-CM | POA: Diagnosis not present

## 2021-06-22 DIAGNOSIS — C3432 Malignant neoplasm of lower lobe, left bronchus or lung: Secondary | ICD-10-CM | POA: Diagnosis not present

## 2021-06-24 DIAGNOSIS — Z510 Encounter for antineoplastic radiation therapy: Secondary | ICD-10-CM | POA: Diagnosis not present

## 2021-06-24 DIAGNOSIS — C787 Secondary malignant neoplasm of liver and intrahepatic bile duct: Secondary | ICD-10-CM | POA: Diagnosis not present

## 2021-06-24 DIAGNOSIS — C7931 Secondary malignant neoplasm of brain: Secondary | ICD-10-CM | POA: Diagnosis not present

## 2021-06-24 DIAGNOSIS — Z5111 Encounter for antineoplastic chemotherapy: Secondary | ICD-10-CM | POA: Diagnosis not present

## 2021-06-24 DIAGNOSIS — E119 Type 2 diabetes mellitus without complications: Secondary | ICD-10-CM | POA: Diagnosis not present

## 2021-06-24 DIAGNOSIS — C3432 Malignant neoplasm of lower lobe, left bronchus or lung: Secondary | ICD-10-CM | POA: Diagnosis not present

## 2021-06-24 DIAGNOSIS — Z79899 Other long term (current) drug therapy: Secondary | ICD-10-CM | POA: Diagnosis not present

## 2021-06-25 ENCOUNTER — Other Ambulatory Visit: Payer: Self-pay | Admitting: Physician Assistant

## 2021-06-25 ENCOUNTER — Ambulatory Visit
Admission: RE | Admit: 2021-06-25 | Discharge: 2021-06-25 | Disposition: A | Discharge status: Home / Self Care | Payer: Medicare HMO | Source: Ambulatory Visit | Attending: Radiation Oncology | Admitting: Radiation Oncology

## 2021-06-25 ENCOUNTER — Other Ambulatory Visit: Payer: Self-pay

## 2021-06-25 DIAGNOSIS — Z5111 Encounter for antineoplastic chemotherapy: Secondary | ICD-10-CM | POA: Diagnosis not present

## 2021-06-25 DIAGNOSIS — Z510 Encounter for antineoplastic radiation therapy: Secondary | ICD-10-CM | POA: Diagnosis not present

## 2021-06-25 DIAGNOSIS — C7931 Secondary malignant neoplasm of brain: Secondary | ICD-10-CM | POA: Diagnosis not present

## 2021-06-25 DIAGNOSIS — C787 Secondary malignant neoplasm of liver and intrahepatic bile duct: Secondary | ICD-10-CM | POA: Diagnosis not present

## 2021-06-25 DIAGNOSIS — C3432 Malignant neoplasm of lower lobe, left bronchus or lung: Secondary | ICD-10-CM | POA: Diagnosis not present

## 2021-06-25 DIAGNOSIS — Z79899 Other long term (current) drug therapy: Secondary | ICD-10-CM | POA: Diagnosis not present

## 2021-06-25 DIAGNOSIS — E119 Type 2 diabetes mellitus without complications: Secondary | ICD-10-CM | POA: Diagnosis not present

## 2021-06-26 ENCOUNTER — Ambulatory Visit
Admission: RE | Admit: 2021-06-26 | Discharge: 2021-06-26 | Disposition: A | Discharge status: Home / Self Care | Payer: Medicare HMO | Source: Ambulatory Visit | Attending: Radiation Oncology | Admitting: Radiation Oncology

## 2021-06-26 ENCOUNTER — Inpatient Hospital Stay: Payer: Medicare HMO

## 2021-06-26 DIAGNOSIS — Z79899 Other long term (current) drug therapy: Secondary | ICD-10-CM | POA: Diagnosis not present

## 2021-06-26 DIAGNOSIS — C3432 Malignant neoplasm of lower lobe, left bronchus or lung: Secondary | ICD-10-CM

## 2021-06-26 DIAGNOSIS — Z510 Encounter for antineoplastic radiation therapy: Secondary | ICD-10-CM | POA: Diagnosis not present

## 2021-06-26 DIAGNOSIS — C7931 Secondary malignant neoplasm of brain: Secondary | ICD-10-CM | POA: Diagnosis not present

## 2021-06-26 DIAGNOSIS — E119 Type 2 diabetes mellitus without complications: Secondary | ICD-10-CM | POA: Diagnosis not present

## 2021-06-26 DIAGNOSIS — Z5111 Encounter for antineoplastic chemotherapy: Secondary | ICD-10-CM | POA: Diagnosis not present

## 2021-06-26 DIAGNOSIS — C787 Secondary malignant neoplasm of liver and intrahepatic bile duct: Secondary | ICD-10-CM | POA: Diagnosis not present

## 2021-06-26 DIAGNOSIS — Z95828 Presence of other vascular implants and grafts: Secondary | ICD-10-CM

## 2021-06-26 LAB — CBC WITH DIFFERENTIAL (CANCER CENTER ONLY)
Abs Immature Granulocytes: 0.21 10*3/uL — ABNORMAL HIGH (ref 0.00–0.07)
Basophils Absolute: 0 10*3/uL (ref 0.0–0.1)
Basophils Relative: 0 %
Eosinophils Absolute: 0 10*3/uL (ref 0.0–0.5)
Eosinophils Relative: 0 %
HCT: 29 % — ABNORMAL LOW (ref 36.0–46.0)
Hemoglobin: 9.6 g/dL — ABNORMAL LOW (ref 12.0–15.0)
Immature Granulocytes: 8 %
Lymphocytes Relative: 20 %
Lymphs Abs: 0.5 10*3/uL — ABNORMAL LOW (ref 0.7–4.0)
MCH: 30.7 pg (ref 26.0–34.0)
MCHC: 33.1 g/dL (ref 30.0–36.0)
MCV: 92.7 fL (ref 80.0–100.0)
Monocytes Absolute: 0.4 10*3/uL (ref 0.1–1.0)
Monocytes Relative: 16 %
Neutro Abs: 1.5 10*3/uL — ABNORMAL LOW (ref 1.7–7.7)
Neutrophils Relative %: 56 %
Platelet Count: 99 10*3/uL — ABNORMAL LOW (ref 150–400)
RBC: 3.13 MIL/uL — ABNORMAL LOW (ref 3.87–5.11)
RDW: 14 % (ref 11.5–15.5)
Smear Review: NORMAL
WBC Count: 2.6 10*3/uL — ABNORMAL LOW (ref 4.0–10.5)
nRBC: 1.1 % — ABNORMAL HIGH (ref 0.0–0.2)

## 2021-06-26 LAB — CMP (CANCER CENTER ONLY)
ALT: 26 U/L (ref 0–44)
AST: 17 U/L (ref 15–41)
Albumin: 3.5 g/dL (ref 3.5–5.0)
Alkaline Phosphatase: 43 U/L (ref 38–126)
Anion gap: 3 — ABNORMAL LOW (ref 5–15)
BUN: 24 mg/dL — ABNORMAL HIGH (ref 8–23)
CO2: 35 mmol/L — ABNORMAL HIGH (ref 22–32)
Calcium: 9.2 mg/dL (ref 8.9–10.3)
Chloride: 97 mmol/L — ABNORMAL LOW (ref 98–111)
Creatinine: 0.64 mg/dL (ref 0.44–1.00)
GFR, Estimated: >60 mL/min (ref >60)
Glucose, Bld: 101 mg/dL — ABNORMAL HIGH (ref 70–99)
Potassium: 4.5 mmol/L (ref 3.5–5.1)
Sodium: 135 mmol/L (ref 135–145)
Total Bilirubin: 0.4 mg/dL (ref 0.3–1.2)
Total Protein: 5.6 g/dL — ABNORMAL LOW (ref 6.5–8.1)

## 2021-06-26 LAB — SAMPLE TO BLOOD BANK

## 2021-06-26 MED ORDER — HEPARIN SOD (PORK) LOCK FLUSH 100 UNIT/ML IV SOLN
500.0000 [IU] | Freq: Once | INTRAVENOUS | Status: AC
  Administered 2021-06-26: 500 [IU]

## 2021-06-26 MED ORDER — SODIUM CHLORIDE 0.9% FLUSH
10.0000 mL | Freq: Once | INTRAVENOUS | Status: AC
  Administered 2021-06-26: 10 mL

## 2021-06-27 ENCOUNTER — Ambulatory Visit
Admission: RE | Admit: 2021-06-27 | Discharge: 2021-06-27 | Disposition: A | Discharge status: Home / Self Care | Payer: Medicare HMO | Source: Ambulatory Visit | Attending: Radiation Oncology | Admitting: Radiation Oncology

## 2021-06-27 ENCOUNTER — Other Ambulatory Visit: Payer: Self-pay

## 2021-06-27 DIAGNOSIS — C7931 Secondary malignant neoplasm of brain: Secondary | ICD-10-CM | POA: Diagnosis not present

## 2021-06-27 DIAGNOSIS — Z510 Encounter for antineoplastic radiation therapy: Secondary | ICD-10-CM | POA: Diagnosis not present

## 2021-06-27 DIAGNOSIS — C787 Secondary malignant neoplasm of liver and intrahepatic bile duct: Secondary | ICD-10-CM | POA: Diagnosis not present

## 2021-06-27 DIAGNOSIS — Z79899 Other long term (current) drug therapy: Secondary | ICD-10-CM | POA: Diagnosis not present

## 2021-06-27 DIAGNOSIS — C3432 Malignant neoplasm of lower lobe, left bronchus or lung: Secondary | ICD-10-CM | POA: Diagnosis not present

## 2021-06-27 DIAGNOSIS — Z5111 Encounter for antineoplastic chemotherapy: Secondary | ICD-10-CM | POA: Diagnosis not present

## 2021-06-27 DIAGNOSIS — E119 Type 2 diabetes mellitus without complications: Secondary | ICD-10-CM | POA: Diagnosis not present

## 2021-06-28 ENCOUNTER — Other Ambulatory Visit: Payer: Self-pay

## 2021-06-28 ENCOUNTER — Ambulatory Visit
Admission: RE | Admit: 2021-06-28 | Discharge: 2021-06-28 | Disposition: A | Discharge status: Home / Self Care | Payer: Medicare HMO | Source: Ambulatory Visit | Attending: Radiation Oncology | Admitting: Radiation Oncology

## 2021-06-28 DIAGNOSIS — Z5111 Encounter for antineoplastic chemotherapy: Secondary | ICD-10-CM | POA: Diagnosis not present

## 2021-06-28 DIAGNOSIS — C7931 Secondary malignant neoplasm of brain: Secondary | ICD-10-CM | POA: Diagnosis not present

## 2021-06-28 DIAGNOSIS — Z510 Encounter for antineoplastic radiation therapy: Secondary | ICD-10-CM | POA: Diagnosis not present

## 2021-06-28 DIAGNOSIS — Z79899 Other long term (current) drug therapy: Secondary | ICD-10-CM | POA: Diagnosis not present

## 2021-06-28 DIAGNOSIS — C787 Secondary malignant neoplasm of liver and intrahepatic bile duct: Secondary | ICD-10-CM | POA: Diagnosis not present

## 2021-06-28 DIAGNOSIS — C3432 Malignant neoplasm of lower lobe, left bronchus or lung: Secondary | ICD-10-CM | POA: Diagnosis not present

## 2021-06-28 DIAGNOSIS — E119 Type 2 diabetes mellitus without complications: Secondary | ICD-10-CM | POA: Diagnosis not present

## 2021-06-29 ENCOUNTER — Ambulatory Visit
Admission: RE | Admit: 2021-06-29 | Discharge: 2021-06-29 | Disposition: A | Discharge status: Home / Self Care | Payer: Medicare HMO | Source: Ambulatory Visit | Attending: Radiation Oncology | Admitting: Radiation Oncology

## 2021-06-29 ENCOUNTER — Other Ambulatory Visit: Payer: Self-pay

## 2021-06-29 DIAGNOSIS — C3432 Malignant neoplasm of lower lobe, left bronchus or lung: Secondary | ICD-10-CM | POA: Diagnosis not present

## 2021-06-29 DIAGNOSIS — C787 Secondary malignant neoplasm of liver and intrahepatic bile duct: Secondary | ICD-10-CM | POA: Diagnosis not present

## 2021-06-29 DIAGNOSIS — Z5111 Encounter for antineoplastic chemotherapy: Secondary | ICD-10-CM | POA: Diagnosis not present

## 2021-06-29 DIAGNOSIS — Z510 Encounter for antineoplastic radiation therapy: Secondary | ICD-10-CM | POA: Diagnosis not present

## 2021-06-29 DIAGNOSIS — E119 Type 2 diabetes mellitus without complications: Secondary | ICD-10-CM | POA: Diagnosis not present

## 2021-06-29 DIAGNOSIS — C7931 Secondary malignant neoplasm of brain: Secondary | ICD-10-CM | POA: Diagnosis not present

## 2021-06-29 DIAGNOSIS — Z79899 Other long term (current) drug therapy: Secondary | ICD-10-CM | POA: Diagnosis not present

## 2021-07-02 ENCOUNTER — Ambulatory Visit
Admission: RE | Admit: 2021-07-02 | Discharge: 2021-07-02 | Disposition: A | Discharge status: Home / Self Care | Payer: Medicare HMO | Source: Ambulatory Visit | Attending: Radiation Oncology | Admitting: Radiation Oncology

## 2021-07-02 DIAGNOSIS — Z510 Encounter for antineoplastic radiation therapy: Secondary | ICD-10-CM | POA: Diagnosis not present

## 2021-07-02 DIAGNOSIS — E119 Type 2 diabetes mellitus without complications: Secondary | ICD-10-CM | POA: Diagnosis not present

## 2021-07-02 DIAGNOSIS — Z79899 Other long term (current) drug therapy: Secondary | ICD-10-CM | POA: Diagnosis not present

## 2021-07-02 DIAGNOSIS — Z5111 Encounter for antineoplastic chemotherapy: Secondary | ICD-10-CM | POA: Diagnosis not present

## 2021-07-02 DIAGNOSIS — C7931 Secondary malignant neoplasm of brain: Secondary | ICD-10-CM | POA: Diagnosis not present

## 2021-07-02 DIAGNOSIS — C787 Secondary malignant neoplasm of liver and intrahepatic bile duct: Secondary | ICD-10-CM | POA: Diagnosis not present

## 2021-07-02 DIAGNOSIS — C3432 Malignant neoplasm of lower lobe, left bronchus or lung: Secondary | ICD-10-CM | POA: Diagnosis not present

## 2021-07-03 ENCOUNTER — Other Ambulatory Visit: Payer: Medicare HMO

## 2021-07-03 ENCOUNTER — Encounter: Payer: Self-pay | Admitting: Radiation Oncology

## 2021-07-03 ENCOUNTER — Ambulatory Visit: Payer: Medicare HMO

## 2021-07-03 ENCOUNTER — Inpatient Hospital Stay: Payer: Medicare HMO

## 2021-07-03 ENCOUNTER — Ambulatory Visit: Payer: Medicare HMO | Admitting: Internal Medicine

## 2021-07-03 ENCOUNTER — Other Ambulatory Visit: Payer: Self-pay

## 2021-07-03 ENCOUNTER — Ambulatory Visit
Admission: RE | Admit: 2021-07-03 | Discharge: 2021-07-03 | Disposition: A | Discharge status: Home / Self Care | Payer: Medicare HMO | Source: Ambulatory Visit | Attending: Radiation Oncology | Admitting: Radiation Oncology

## 2021-07-03 DIAGNOSIS — C3432 Malignant neoplasm of lower lobe, left bronchus or lung: Secondary | ICD-10-CM | POA: Diagnosis not present

## 2021-07-03 DIAGNOSIS — C7931 Secondary malignant neoplasm of brain: Secondary | ICD-10-CM | POA: Diagnosis not present

## 2021-07-03 DIAGNOSIS — Z510 Encounter for antineoplastic radiation therapy: Secondary | ICD-10-CM | POA: Diagnosis not present

## 2021-07-03 DIAGNOSIS — Z5111 Encounter for antineoplastic chemotherapy: Secondary | ICD-10-CM | POA: Diagnosis not present

## 2021-07-03 DIAGNOSIS — Z95828 Presence of other vascular implants and grafts: Secondary | ICD-10-CM

## 2021-07-03 DIAGNOSIS — Z79899 Other long term (current) drug therapy: Secondary | ICD-10-CM | POA: Diagnosis not present

## 2021-07-03 DIAGNOSIS — C787 Secondary malignant neoplasm of liver and intrahepatic bile duct: Secondary | ICD-10-CM | POA: Diagnosis not present

## 2021-07-03 DIAGNOSIS — E119 Type 2 diabetes mellitus without complications: Secondary | ICD-10-CM | POA: Diagnosis not present

## 2021-07-03 LAB — CBC WITH DIFFERENTIAL (CANCER CENTER ONLY)
Abs Immature Granulocytes: 0.78 10*3/uL — ABNORMAL HIGH (ref 0.00–0.07)
Basophils Absolute: 0.1 10*3/uL (ref 0.0–0.1)
Basophils Relative: 1 %
Eosinophils Absolute: 0 10*3/uL (ref 0.0–0.5)
Eosinophils Relative: 0 %
HCT: 29.7 % — ABNORMAL LOW (ref 36.0–46.0)
Hemoglobin: 10.1 g/dL — ABNORMAL LOW (ref 12.0–15.0)
Immature Granulocytes: 7 %
Lymphocytes Relative: 4 %
Lymphs Abs: 0.5 10*3/uL — ABNORMAL LOW (ref 0.7–4.0)
MCH: 31.5 pg (ref 26.0–34.0)
MCHC: 34 g/dL (ref 30.0–36.0)
MCV: 92.5 fL (ref 80.0–100.0)
Monocytes Absolute: 1 10*3/uL (ref 0.1–1.0)
Monocytes Relative: 9 %
Neutro Abs: 8.4 10*3/uL — ABNORMAL HIGH (ref 1.7–7.7)
Neutrophils Relative %: 79 %
Platelet Count: 171 10*3/uL (ref 150–400)
RBC: 3.21 MIL/uL — ABNORMAL LOW (ref 3.87–5.11)
RDW: 14.9 % (ref 11.5–15.5)
Smear Review: NORMAL
WBC Count: 10.7 10*3/uL — ABNORMAL HIGH (ref 4.0–10.5)
nRBC: 0.2 % (ref 0.0–0.2)

## 2021-07-03 LAB — CMP (CANCER CENTER ONLY)
ALT: 25 U/L (ref 0–44)
AST: 17 U/L (ref 15–41)
Albumin: 3.3 g/dL — ABNORMAL LOW (ref 3.5–5.0)
Alkaline Phosphatase: 47 U/L (ref 38–126)
Anion gap: 2 — ABNORMAL LOW (ref 5–15)
BUN: 34 mg/dL — ABNORMAL HIGH (ref 8–23)
CO2: 35 mmol/L — ABNORMAL HIGH (ref 22–32)
Calcium: 8.2 mg/dL — ABNORMAL LOW (ref 8.9–10.3)
Chloride: 95 mmol/L — ABNORMAL LOW (ref 98–111)
Creatinine: 0.64 mg/dL (ref 0.44–1.00)
GFR, Estimated: >60 mL/min (ref >60)
Glucose, Bld: 108 mg/dL — ABNORMAL HIGH (ref 70–99)
Potassium: 4.7 mmol/L (ref 3.5–5.1)
Sodium: 132 mmol/L — ABNORMAL LOW (ref 135–145)
Total Bilirubin: 0.4 mg/dL (ref 0.3–1.2)
Total Protein: 5.3 g/dL — ABNORMAL LOW (ref 6.5–8.1)

## 2021-07-03 LAB — SAMPLE TO BLOOD BANK

## 2021-07-03 MED ORDER — HEPARIN SOD (PORK) LOCK FLUSH 100 UNIT/ML IV SOLN
500.0000 [IU] | Freq: Once | INTRAVENOUS | Status: AC
  Administered 2021-07-03: 500 [IU]

## 2021-07-03 MED ORDER — SODIUM CHLORIDE 0.9% FLUSH
10.0000 mL | Freq: Once | INTRAVENOUS | Status: AC
  Administered 2021-07-03: 10 mL

## 2021-07-03 NOTE — Progress Notes (Signed)
?  Radiation Oncology         [phone] ?________________________________ ? ?Name: Dawn Gomez  IDH:[id number] ? ?Date of Service: 07/03/21  DOB: 04/06/1936 ? ? ?Steroid Taper Instructions ? ? ?You currently have a prescription for Dexamethasone 4 mg Tablets.  ? ? ?Beginning 07/07/21: ?Take 1/2 of a tablet (which is 2 mg) twice a day ? ?Beginning 07/14/21: ?Take 1/2 of a tablet (which is 2 mg) once a day ? ?Beginning 07/21/21: ?Take 1/2 of a tablet (which is 2 mg) every other day and stop on 07/31/21. ? ? ?Please call our office if you have any headaches, visual changes, uncontrolled movements, nausea or vomiting.  ?  ?

## 2021-07-04 ENCOUNTER — Other Ambulatory Visit: Payer: Self-pay

## 2021-07-04 ENCOUNTER — Ambulatory Visit
Admission: RE | Admit: 2021-07-04 | Discharge: 2021-07-04 | Disposition: A | Discharge status: Home / Self Care | Payer: Medicare HMO | Source: Ambulatory Visit | Attending: Radiation Oncology | Admitting: Radiation Oncology

## 2021-07-04 ENCOUNTER — Ambulatory Visit: Payer: Medicare HMO

## 2021-07-04 DIAGNOSIS — Z79899 Other long term (current) drug therapy: Secondary | ICD-10-CM | POA: Diagnosis not present

## 2021-07-04 DIAGNOSIS — C787 Secondary malignant neoplasm of liver and intrahepatic bile duct: Secondary | ICD-10-CM | POA: Diagnosis not present

## 2021-07-04 DIAGNOSIS — Z5111 Encounter for antineoplastic chemotherapy: Secondary | ICD-10-CM | POA: Diagnosis not present

## 2021-07-04 DIAGNOSIS — Z510 Encounter for antineoplastic radiation therapy: Secondary | ICD-10-CM | POA: Diagnosis not present

## 2021-07-04 DIAGNOSIS — E119 Type 2 diabetes mellitus without complications: Secondary | ICD-10-CM | POA: Diagnosis not present

## 2021-07-04 DIAGNOSIS — C3432 Malignant neoplasm of lower lobe, left bronchus or lung: Secondary | ICD-10-CM | POA: Diagnosis not present

## 2021-07-04 DIAGNOSIS — C7931 Secondary malignant neoplasm of brain: Secondary | ICD-10-CM | POA: Diagnosis not present

## 2021-07-05 ENCOUNTER — Ambulatory Visit: Payer: Medicare HMO

## 2021-07-05 ENCOUNTER — Ambulatory Visit
Admission: RE | Admit: 2021-07-05 | Discharge: 2021-07-05 | Disposition: A | Discharge status: Home / Self Care | Payer: Medicare HMO | Source: Ambulatory Visit | Attending: Radiation Oncology | Admitting: Radiation Oncology

## 2021-07-05 DIAGNOSIS — Z5111 Encounter for antineoplastic chemotherapy: Secondary | ICD-10-CM | POA: Diagnosis not present

## 2021-07-05 DIAGNOSIS — E119 Type 2 diabetes mellitus without complications: Secondary | ICD-10-CM | POA: Diagnosis not present

## 2021-07-05 DIAGNOSIS — C3432 Malignant neoplasm of lower lobe, left bronchus or lung: Secondary | ICD-10-CM | POA: Diagnosis not present

## 2021-07-05 DIAGNOSIS — C7931 Secondary malignant neoplasm of brain: Secondary | ICD-10-CM | POA: Diagnosis not present

## 2021-07-05 DIAGNOSIS — Z79899 Other long term (current) drug therapy: Secondary | ICD-10-CM | POA: Diagnosis not present

## 2021-07-05 DIAGNOSIS — C787 Secondary malignant neoplasm of liver and intrahepatic bile duct: Secondary | ICD-10-CM | POA: Diagnosis not present

## 2021-07-05 DIAGNOSIS — Z510 Encounter for antineoplastic radiation therapy: Secondary | ICD-10-CM | POA: Diagnosis not present

## 2021-07-06 ENCOUNTER — Ambulatory Visit
Admission: RE | Admit: 2021-07-06 | Discharge: 2021-07-06 | Disposition: A | Discharge status: Home / Self Care | Payer: Medicare HMO | Source: Ambulatory Visit | Attending: Radiation Oncology | Admitting: Radiation Oncology

## 2021-07-06 ENCOUNTER — Encounter: Payer: Self-pay | Admitting: Radiation Oncology

## 2021-07-06 DIAGNOSIS — Z5111 Encounter for antineoplastic chemotherapy: Secondary | ICD-10-CM | POA: Diagnosis not present

## 2021-07-06 DIAGNOSIS — Z79899 Other long term (current) drug therapy: Secondary | ICD-10-CM | POA: Diagnosis not present

## 2021-07-06 DIAGNOSIS — Z510 Encounter for antineoplastic radiation therapy: Secondary | ICD-10-CM | POA: Diagnosis not present

## 2021-07-06 DIAGNOSIS — C787 Secondary malignant neoplasm of liver and intrahepatic bile duct: Secondary | ICD-10-CM | POA: Diagnosis not present

## 2021-07-06 DIAGNOSIS — E119 Type 2 diabetes mellitus without complications: Secondary | ICD-10-CM | POA: Diagnosis not present

## 2021-07-06 DIAGNOSIS — C7931 Secondary malignant neoplasm of brain: Secondary | ICD-10-CM | POA: Diagnosis not present

## 2021-07-06 DIAGNOSIS — C3432 Malignant neoplasm of lower lobe, left bronchus or lung: Secondary | ICD-10-CM | POA: Diagnosis not present

## 2021-07-09 ENCOUNTER — Telehealth: Payer: Self-pay | Admitting: Internal Medicine

## 2021-07-09 NOTE — Telephone Encounter (Signed)
.  Called pt per 4/3 inbasket , Patient was unavailable, a message with appt time and date was left with number on file.   ?

## 2021-07-10 ENCOUNTER — Ambulatory Visit: Payer: Medicare HMO

## 2021-07-10 ENCOUNTER — Other Ambulatory Visit: Payer: Self-pay

## 2021-07-10 ENCOUNTER — Inpatient Hospital Stay: Payer: Medicare HMO | Attending: Internal Medicine

## 2021-07-10 ENCOUNTER — Other Ambulatory Visit: Payer: Medicare HMO

## 2021-07-10 ENCOUNTER — Inpatient Hospital Stay: Payer: Medicare HMO

## 2021-07-10 ENCOUNTER — Encounter: Payer: Self-pay | Admitting: Internal Medicine

## 2021-07-10 ENCOUNTER — Inpatient Hospital Stay (HOSPITAL_BASED_OUTPATIENT_CLINIC_OR_DEPARTMENT_OTHER): Payer: Medicare HMO | Admitting: Internal Medicine

## 2021-07-10 VITALS — BP 101/44 | HR 56 | Temp 97.8°F | Resp 18

## 2021-07-10 DIAGNOSIS — C3432 Malignant neoplasm of lower lobe, left bronchus or lung: Secondary | ICD-10-CM | POA: Diagnosis not present

## 2021-07-10 DIAGNOSIS — Z79899 Other long term (current) drug therapy: Secondary | ICD-10-CM | POA: Diagnosis not present

## 2021-07-10 DIAGNOSIS — C3490 Malignant neoplasm of unspecified part of unspecified bronchus or lung: Secondary | ICD-10-CM | POA: Diagnosis not present

## 2021-07-10 DIAGNOSIS — Z5111 Encounter for antineoplastic chemotherapy: Secondary | ICD-10-CM | POA: Insufficient documentation

## 2021-07-10 DIAGNOSIS — Z95828 Presence of other vascular implants and grafts: Secondary | ICD-10-CM

## 2021-07-10 LAB — CMP (CANCER CENTER ONLY)
ALT: 20 U/L (ref 0–44)
AST: 13 U/L — ABNORMAL LOW (ref 15–41)
Albumin: 3.3 g/dL — ABNORMAL LOW (ref 3.5–5.0)
Alkaline Phosphatase: 33 U/L — ABNORMAL LOW (ref 38–126)
Anion gap: 3 — ABNORMAL LOW (ref 5–15)
BUN: 29 mg/dL — ABNORMAL HIGH (ref 8–23)
CO2: 33 mmol/L — ABNORMAL HIGH (ref 22–32)
Calcium: 8.7 mg/dL — ABNORMAL LOW (ref 8.9–10.3)
Chloride: 97 mmol/L — ABNORMAL LOW (ref 98–111)
Creatinine: 0.69 mg/dL (ref 0.44–1.00)
GFR, Estimated: >60 mL/min (ref >60)
Glucose, Bld: 84 mg/dL (ref 70–99)
Potassium: 4.4 mmol/L (ref 3.5–5.1)
Sodium: 133 mmol/L — ABNORMAL LOW (ref 135–145)
Total Bilirubin: 0.6 mg/dL (ref 0.3–1.2)
Total Protein: 5.7 g/dL — ABNORMAL LOW (ref 6.5–8.1)

## 2021-07-10 LAB — CBC WITH DIFFERENTIAL (CANCER CENTER ONLY)
Abs Immature Granulocytes: 0.17 10*3/uL — ABNORMAL HIGH (ref 0.00–0.07)
Basophils Absolute: 0 10*3/uL (ref 0.0–0.1)
Basophils Relative: 0 %
Eosinophils Absolute: 0 10*3/uL (ref 0.0–0.5)
Eosinophils Relative: 1 %
HCT: 30.2 % — ABNORMAL LOW (ref 36.0–46.0)
Hemoglobin: 10 g/dL — ABNORMAL LOW (ref 12.0–15.0)
Immature Granulocytes: 2 %
Lymphocytes Relative: 7 %
Lymphs Abs: 0.5 10*3/uL — ABNORMAL LOW (ref 0.7–4.0)
MCH: 31.2 pg (ref 26.0–34.0)
MCHC: 33.1 g/dL (ref 30.0–36.0)
MCV: 94.1 fL (ref 80.0–100.0)
Monocytes Absolute: 0.7 10*3/uL (ref 0.1–1.0)
Monocytes Relative: 9 %
Neutro Abs: 6.3 10*3/uL (ref 1.7–7.7)
Neutrophils Relative %: 81 %
Platelet Count: 137 10*3/uL — ABNORMAL LOW (ref 150–400)
RBC: 3.21 MIL/uL — ABNORMAL LOW (ref 3.87–5.11)
RDW: 15.2 % (ref 11.5–15.5)
WBC Count: 7.8 10*3/uL (ref 4.0–10.5)
nRBC: 0 % (ref 0.0–0.2)

## 2021-07-10 LAB — SAMPLE TO BLOOD BANK

## 2021-07-10 MED ORDER — SODIUM CHLORIDE 0.9% FLUSH
10.0000 mL | Freq: Once | INTRAVENOUS | Status: AC
  Administered 2021-07-10: 10 mL

## 2021-07-10 MED ORDER — PALONOSETRON HCL INJECTION 0.25 MG/5ML
0.2500 mg | Freq: Once | INTRAVENOUS | Status: AC
  Administered 2021-07-10: 0.25 mg via INTRAVENOUS
  Filled 2021-07-10: qty 5

## 2021-07-10 MED ORDER — SODIUM CHLORIDE 0.9 % IV SOLN
356.5000 mg | Freq: Once | INTRAVENOUS | Status: AC
  Administered 2021-07-10: 360 mg via INTRAVENOUS
  Filled 2021-07-10: qty 36

## 2021-07-10 MED ORDER — SODIUM CHLORIDE 0.9 % IV SOLN: Freq: Once | INTRAVENOUS | Status: AC

## 2021-07-10 MED ORDER — SODIUM CHLORIDE 0.9 % IV SOLN
100.0000 mg/m2 | Freq: Once | INTRAVENOUS | Status: AC
  Administered 2021-07-10: 170 mg via INTRAVENOUS
  Filled 2021-07-10: qty 8.5

## 2021-07-10 MED ORDER — SODIUM CHLORIDE 0.9 % IV SOLN
150.0000 mg | Freq: Once | INTRAVENOUS | Status: AC
  Administered 2021-07-10: 150 mg via INTRAVENOUS
  Filled 2021-07-10: qty 150

## 2021-07-10 MED ORDER — SODIUM CHLORIDE 0.9% FLUSH
10.0000 mL | INTRAVENOUS | Status: DC | PRN
  Administered 2021-07-10: 10 mL

## 2021-07-10 MED ORDER — SODIUM CHLORIDE 0.9 % IV SOLN
10.0000 mg | Freq: Once | INTRAVENOUS | Status: AC
  Administered 2021-07-10: 10 mg via INTRAVENOUS
  Filled 2021-07-10: qty 10

## 2021-07-10 MED ORDER — TRILACICLIB DIHYDROCHLORIDE INJECTION 300 MG
240.0000 mg/m2 | Freq: Once | INTRAVENOUS | Status: AC
  Administered 2021-07-10: 405 mg via INTRAVENOUS
  Filled 2021-07-10: qty 27

## 2021-07-10 MED ORDER — HEPARIN SOD (PORK) LOCK FLUSH 100 UNIT/ML IV SOLN
500.0000 [IU] | Freq: Once | INTRAVENOUS | Status: AC | PRN
  Administered 2021-07-10: 500 [IU]

## 2021-07-10 NOTE — Progress Notes (Signed)
?    [facility] ?[phone]   [phone] ? ?OFFICE PROGRESS NOTE ? ?Biagio Borg, MD ?[place] [address] ? ?DIAGNOSIS: Extensive stage (T3, N2, M1 C) small cell lung cancer presented with large left lower lobe lung mass in addition to right hilar and mediastinal lymphadenopathy as well as satellite nodule in the left lower lobe and peripheral pleural lesion as well as metastatic liver lesions diagnosed in May 2022. ? ?PRIOR THERAPY: Systemic chemotherapy with carboplatin for AUC of 5 from day 1, etoposide 100 Mg/M2 on days 1, 2 and 3 with Cosela before the chemotherapy every 3 weeks.  Status post 4 cycles.  First cycle started September 18, 2020.  Status post 4 cycles.  The patient did not receive immunotherapy because of the extensive psoriasis.  Last dose of treatment was given on November 20, 2020 ?2) whole brain irradiation under the care of Dr. Lisbeth Renshaw. ? ?CURRENT THERAPY: Second line systemic chemotherapy with carboplatin for AUC of 5 on day 1 and etoposide 100 Mg/M2 on days 1, 2 and 3 with Cosela 240 Mg/M2 before chemotherapy every 3 weeks.  First dose June 12, 2021.  Status post 1 cycle. ? ?INTERVAL HISTORY: ?Dawn Gomez 86 y.o. female returns to the clinic today for follow-up visit.  The patient was accompanied by a friend from discharge.  She is feeling fine today with no concerning complaints except for headache after the whole brain irradiation.  She denied having any current chest pain but continues to have the baseline shortness of breath increased with exertion with mild cough and no hemoptysis.  She denied having any nausea, vomiting, diarrhea or constipation.  She denied having any headache or visual changes.  She has no recent weight loss or night sweats.  She is here today for evaluation before starting cycle #2 of her treatment. ? ? ?MEDICAL HISTORY: ?Past Medical History:  ?Diagnosis Date  ? Abnormal heart rhythm   ? Aortic aneurysm  ([place])   ? Aortic atherosclerosis ([place]) 03/03/2019  ? Cancer [facility])   ? Coronary artery calcification seen on CT scan 12/18/2020  ? Dyspnea   ? Hypothyroid   ? OSA (obstructive sleep apnea)   ? on cpap  ? Psoriasis 02/05/2017  ? Pulmonary fibrosis ([place])   ? SVT (supraventricular tachycardia) ([place])   ? Thyroid disease   ? hypo  ? ? ?ALLERGIES:  is allergic to lipitor [atorvastatin], codeine, hydromorphone hcl, levaquin [levofloxacin in d5w], morphine, oxycodone-acetaminophen, propoxyphene n-acetaminophen, and simvastatin. ? ?MEDICATIONS:  ?Current Outpatient Medications  ?Medication Sig Dispense Refill  ? acetaminophen (TYLENOL) 500 MG tablet Take 500 mg by mouth every 6 (six) hours as needed.    ? Ascorbic Acid (VITAMIN C PO) Take by mouth.    ? Azelastine-Fluticasone 137-50 MCG/ACT SUSP Place 1 spray into the nose every 12 (twelve) hours. 23 g 5  ? Blood Glucose Monitoring Suppl (TRUE METRIX METER) w/Device KIT USE AS DIRECTED 1 kit 0  ? CALCIUM-MAGNESIUM-ZINC PO Take 1 tablet by mouth daily.    ? cholecalciferol (VITAMIN D) 1000 units tablet Take 5,000 Units by mouth daily.    ? dexamethasone (DECADRON) 4 MG tablet Take 1 tablet (4 mg total) by mouth 2 (two) times daily with a meal. 60 tablet 0  ? diltiazem (CARDIZEM CD) 180 MG 24 hr capsule TAKE 1 CAPSULE EVERY DAY 90 capsule 1  ? escitalopram (LEXAPRO) 20 MG tablet TAKE 1 TABLET AT BEDTIME 90 tablet 2  ?  fenofibrate 160 MG tablet TAKE 1 TABLET EVERY DAY 90 tablet 1  ? fluocinonide (LIDEX) 0.05 % external solution APP EXT AA OF SCALP NIGHTLY  3  ? furosemide (LASIX) 80 MG tablet Take 0.5 tablets (40 mg total) by mouth daily as needed. 90 tablet 3  ? glucose blood (TRUE METRIX BLOOD GLUCOSE TEST) test strip TEST  UP  TO FOUR TIMES DAILY AS DIRECTED 100 strip 3  ? guaiFENesin (MUCINEX) 600 MG 12 hr tablet Take 2 tablets (1,200 mg total) by mouth 2 (two) times daily as needed. 60 tablet 2  ? ipratropium-albuterol (DUONEB) 0.5-2.5 (3) MG/3ML SOLN Take 3 mLs by  nebulization every 4 (four) hours as needed. 360 mL 0  ? levothyroxine (SYNTHROID) 100 MCG tablet TAKE 1 TABLET EVERY DAY BEFORE BREAKFAST 90 tablet 2  ? lidocaine-prilocaine (EMLA) cream 30 ?Squeeze  1/2 tsp on a cotton ball and apply to skin over port a cath site. Do not rub it in. Cover with plastic wrap. ?Apply 1-2 hours prior to your treatment. 30 g 0  ? lisinopril (ZESTRIL) 5 MG tablet TAKE 1 TABLET EVERY DAY 90 tablet 3  ? memantine (NAMENDA) 10 MG tablet Take 1 tablet (10 mg total) by mouth 2 (two) times daily. 60 tablet 4  ? memantine (NAMENDA) 5 MG tablet Begin this prescription the first day of brain radiation. Week 1: take one tablet po qam. Week 2: take one tablet qam and qpm. Week 3: take two tablets qam, and one tablet po q pm. Week 4: take two tablets qam and qpm. Fill subsequent prescription q month. 70 tablet 0  ? potassium chloride SA (KLOR-CON M) 20 MEQ tablet TAKE 1 TABLET EVERY DAY WITH FOOD 90 tablet 1  ? prochlorperazine (COMPAZINE) 10 MG tablet Take 1 tablet (10 mg total) by mouth every 6 (six) hours as needed for nausea or vomiting. 30 tablet 0  ? rosuvastatin (CRESTOR) 20 MG tablet TAKE 1 TABLET EVERY DAY 90 tablet 2  ? tiZANidine (ZANAFLEX) 2 MG tablet TAKE 1 TABLET THREE TIMES DAILY 270 tablet 1  ? traMADol (ULTRAM) 50 MG tablet TAKE 1 TABLET BY MOUTH FOUR TIMES DAILY. 360 tablet 1  ? traZODone (DESYREL) 50 MG tablet TAKE 1 TABLET (50 MG TOTAL) BY MOUTH AT BEDTIME AS NEEDED FOR SLEEP. 90 tablet 1  ? TRUEplus Lancets 33G MISC TEST  UP  TO FOUR TIMES DAILY AS DIRECTED 100 each 3  ? vitamin B-12 (CYANOCOBALAMIN) 1000 MCG tablet Take 500 mcg by mouth daily.    ? vitamin C (ASCORBIC ACID) 500 MG tablet Take 500 mg by mouth daily.    ? Vitamin D, Ergocalciferol, (DRISDOL) 50000 units CAPS capsule Take 1 capsule (50,000 Units total) by mouth every 7 (seven) days. 12 capsule 0  ? vitamin E 400 UNIT capsule Take 200 Units by mouth daily.    ? ?No current facility-administered medications for this  visit.  ? ? ?SURGICAL HISTORY:  ?Past Surgical History:  ?Procedure Laterality Date  ? ABDOMINAL HYSTERECTOMY    ? APPENDECTOMY    ? CHOLECYSTECTOMY    ? CRANIOTOMY    ? for aneurysms  ? CRANIOTOMY  1980 x 2  ? aneurysmal clipping  ? HERNIA REPAIR    ? IR IMAGING GUIDED PORT INSERTION  09/15/2020  ? TOTAL ABDOMINAL HYSTERECTOMY    ? TUBAL LIGATION    ? ? ?REVIEW OF SYSTEMS:  A comprehensive review of systems was negative except for: Constitutional: positive for fatigue ?Respiratory: positive for  dyspnea on exertion ?Musculoskeletal: positive for muscle weakness  ? ?PHYSICAL EXAMINATION: General appearance: alert, cooperative, appears older than stated age, fatigued, and no distress ?Head: Normocephalic, without obvious abnormality, atraumatic ?Neck: no adenopathy, no JVD, supple, symmetrical, trachea midline, and thyroid not enlarged, symmetric, no tenderness/mass/nodules ?Lymph nodes: Cervical, supraclavicular, and axillary nodes normal. ?Resp: clear to auscultation bilaterally ?Back: symmetric, no curvature. ROM normal. No CVA tenderness. ?Cardio: regular rate and rhythm, S1, S2 normal, no murmur, click, rub or gallop ?GI: soft, non-tender; bowel sounds normal; no masses,  no organomegaly ?Extremities: extremities normal, atraumatic, no cyanosis or edema ? ?ECOG PERFORMANCE STATUS: 1 - Symptomatic but completely ambulatory ? ?Blood pressure (!) 101/44, pulse (!) 56, temperature 97.8 ?F (36.6 ?C), temperature source Oral, resp. rate 18, SpO2 100 %. ? ?LABORATORY DATA: ?Lab Results  ?Component Value Date  ? WBC 7.8 07/10/2021  ? HGB 10.0 (L) 07/10/2021  ? HCT 30.2 (L) 07/10/2021  ? MCV 94.1 07/10/2021  ? PLT 137 (L) 07/10/2021  ? ? ?  Chemistry   ?   ?Component Value Date/Time  ? NA 133 (L) 07/10/2021 4627  ? K 4.4 07/10/2021 0833  ? CL 97 (L) 07/10/2021 0350  ? CO2 33 (H) 07/10/2021 0938  ? BUN 29 (H) 07/10/2021 1829  ? CREATININE 0.69 07/10/2021 0833  ? CREATININE 1.50 (H) 12/17/2019 1519  ?    ?Component Value  Date/Time  ? CALCIUM 8.7 (L) 07/10/2021 9371  ? ALKPHOS 33 (L) 07/10/2021 6967  ? AST 13 (L) 07/10/2021 8938  ? ALT 20 07/10/2021 0833  ? BILITOT 0.6 07/10/2021 1017  ?  ? ? ? ?RADIOGRAPHIC STUDIES: ?CT Head Wo Con

## 2021-07-10 NOTE — Patient Instructions (Signed)
[facility]  Discharge Instructions: ?Thank you for choosing [facility] to provide your oncology and hematology care.  ? ?If you have a lab appointment with the [facility], please go directly to the [facility] and check in at the registration area. ?  ?Wear comfortable clothing and clothing appropriate for easy access to any Portacath or PICC line.  ? ?We strive to give you quality time with your provider. You may need to reschedule your appointment if you arrive late (15 or more minutes).  Arriving late affects you and other patients whose appointments are after yours.  Also, if you miss three or more appointments without notifying the office, you may be dismissed from the clinic at the provider?s discretion.    ?  ?For prescription refill requests, have your pharmacy contact our office and allow 72 hours for refills to be completed.   ? ?Today you received the following chemotherapy and/or immunotherapy agents cosela, etoposide, carboplatin    ?  ?To help prevent nausea and vomiting after your treatment, we encourage you to take your nausea medication as directed. ? ?BELOW ARE SYMPTOMS THAT SHOULD BE REPORTED IMMEDIATELY: ?*FEVER GREATER THAN 100.4 F (38 ?C) OR HIGHER ?*CHILLS OR SWEATING ?*NAUSEA AND VOMITING THAT IS NOT CONTROLLED WITH YOUR NAUSEA MEDICATION ?*UNUSUAL SHORTNESS OF BREATH ?*UNUSUAL BRUISING OR BLEEDING ?*URINARY PROBLEMS (pain or burning when urinating, or frequent urination) ?*BOWEL PROBLEMS (unusual diarrhea, constipation, pain near the anus) ?TENDERNESS IN MOUTH AND THROAT WITH OR WITHOUT PRESENCE OF ULCERS (sore throat, sores in mouth, or a toothache) ?UNUSUAL RASH, SWELLING OR PAIN  ?UNUSUAL VAGINAL DISCHARGE OR ITCHING  ? ?Items with * indicate a potential emergency and should be followed up as soon as possible or go to the Emergency Department if any problems should occur. ? ?Please show the CHEMOTHERAPY ALERT CARD or IMMUNOTHERAPY ALERT  CARD at check-in to the Emergency Department and triage nurse. ? ?Should you have questions after your visit or need to cancel or reschedule your appointment, please contact [facility]  Dept: [phone]  and follow the prompts.  Office hours are 8:00 a.m. to 4:30 p.m. Monday - Friday. Please note that voicemails left after 4:00 p.m. may not be returned until the following business day.  We are closed weekends and major holidays. You have access to a nurse at all times for urgent questions. Please call the main number to the clinic Dept: [phone] and follow the prompts. ? ? ?For any non-urgent questions, you may also contact your provider using MyChart. We now offer e-Visits for anyone 35 and older to request care online for non-urgent symptoms. For details visit [email or web address]. ?  ?Also download the MyChart app! Go to the app store, search "MyChart", open the app, select [facility], and log in with your MyChart username and password. ? ?Due to Covid, a mask is required upon entering the hospital/clinic. If you do not have a mask, one will be given to you upon arrival. For doctor visits, patients may have 1 support person aged 47 or older with them. For treatment visits, patients cannot have anyone with them due to current Covid guidelines and our immunocompromised population.  ? ?

## 2021-07-11 ENCOUNTER — Inpatient Hospital Stay: Payer: Medicare HMO

## 2021-07-11 VITALS — BP 106/48 | HR 71 | Temp 97.7°F | Resp 18

## 2021-07-11 DIAGNOSIS — C3432 Malignant neoplasm of lower lobe, left bronchus or lung: Secondary | ICD-10-CM | POA: Diagnosis not present

## 2021-07-11 DIAGNOSIS — Z5111 Encounter for antineoplastic chemotherapy: Secondary | ICD-10-CM | POA: Diagnosis not present

## 2021-07-11 DIAGNOSIS — Z79899 Other long term (current) drug therapy: Secondary | ICD-10-CM | POA: Diagnosis not present

## 2021-07-11 MED ORDER — TRILACICLIB DIHYDROCHLORIDE INJECTION 300 MG
240.0000 mg/m2 | Freq: Once | INTRAVENOUS | Status: AC
  Administered 2021-07-11: 405 mg via INTRAVENOUS
  Filled 2021-07-11: qty 27

## 2021-07-11 MED ORDER — SODIUM CHLORIDE 0.9 % IV SOLN
100.0000 mg/m2 | Freq: Once | INTRAVENOUS | Status: AC
  Administered 2021-07-11: 170 mg via INTRAVENOUS
  Filled 2021-07-11: qty 8.5

## 2021-07-11 MED ORDER — SODIUM CHLORIDE 0.9% FLUSH
10.0000 mL | INTRAVENOUS | Status: DC | PRN
  Administered 2021-07-11: 10 mL

## 2021-07-11 MED ORDER — SODIUM CHLORIDE 0.9 % IV SOLN: Freq: Once | INTRAVENOUS | Status: AC

## 2021-07-11 MED ORDER — SODIUM CHLORIDE 0.9 % IV SOLN
10.0000 mg | Freq: Once | INTRAVENOUS | Status: AC
  Administered 2021-07-11: 10 mg via INTRAVENOUS
  Filled 2021-07-11: qty 10

## 2021-07-11 MED ORDER — HEPARIN SOD (PORK) LOCK FLUSH 100 UNIT/ML IV SOLN
500.0000 [IU] | Freq: Once | INTRAVENOUS | Status: AC | PRN
  Administered 2021-07-11: 500 [IU]

## 2021-07-11 NOTE — Patient Instructions (Signed)
[facility]   ?Discharge Instructions: ?Thank you for choosing [facility] to provide your oncology and hematology care.  ? ?If you have a lab appointment with the [facility], please go directly to the [facility] and check in at the registration area. ?  ?Wear comfortable clothing and clothing appropriate for easy access to any Portacath or PICC line.  ? ?We strive to give you quality time with your provider. You may need to reschedule your appointment if you arrive late (15 or more minutes).  Arriving late affects you and other patients whose appointments are after yours.  Also, if you miss three or more appointments without notifying the office, you may be dismissed from the clinic at the provider?s discretion.    ?  ?For prescription refill requests, have your pharmacy contact our office and allow 72 hours for refills to be completed.   ? ?Today you received the following chemotherapy and/or immunotherapy agents: Etoposide     ?  ?To help prevent nausea and vomiting after your treatment, we encourage you to take your nausea medication as directed. ? ?BELOW ARE SYMPTOMS THAT SHOULD BE REPORTED IMMEDIATELY: ?*FEVER GREATER THAN 100.4 F (38 ?C) OR HIGHER ?*CHILLS OR SWEATING ?*NAUSEA AND VOMITING THAT IS NOT CONTROLLED WITH YOUR NAUSEA MEDICATION ?*UNUSUAL SHORTNESS OF BREATH ?*UNUSUAL BRUISING OR BLEEDING ?*URINARY PROBLEMS (pain or burning when urinating, or frequent urination) ?*BOWEL PROBLEMS (unusual diarrhea, constipation, pain near the anus) ?TENDERNESS IN MOUTH AND THROAT WITH OR WITHOUT PRESENCE OF ULCERS (sore throat, sores in mouth, or a toothache) ?UNUSUAL RASH, SWELLING OR PAIN  ?UNUSUAL VAGINAL DISCHARGE OR ITCHING  ? ?Items with * indicate a potential emergency and should be followed up as soon as possible or go to the Emergency Department if any problems should occur. ? ?Please show the CHEMOTHERAPY ALERT CARD or IMMUNOTHERAPY ALERT CARD at check-in  to the Emergency Department and triage nurse. ? ?Should you have questions after your visit or need to cancel or reschedule your appointment, please contact [employer]  Dept: [phone]  and follow the prompts.  Office hours are 8:00 a.m. to 4:30 p.m. Monday - Friday. Please note that voicemails left after 4:00 p.m. may not be returned until the following business day.  We are closed weekends and major holidays. You have access to a nurse at all times for urgent questions. Please call the main number to the clinic Dept: [phone] and follow the prompts. ? ? ?For any non-urgent questions, you may also contact your provider using MyChart. We now offer e-Visits for anyone 60 and older to request care online for non-urgent symptoms. For details visit [email or web address]. ?  ?Also download the MyChart app! Go to the app store, search "MyChart", open the app, select [facility], and log in with your MyChart username and password. ? ?Due to Covid, a mask is required upon entering the hospital/clinic. If you do not have a mask, one will be given to you upon arrival. For doctor visits, patients may have 1 support person aged 7 or older with them. For treatment visits, patients cannot have anyone with them due to current Covid guidelines and our immunocompromised population.  ? ?

## 2021-07-12 ENCOUNTER — Other Ambulatory Visit: Payer: Self-pay

## 2021-07-12 ENCOUNTER — Inpatient Hospital Stay: Payer: Medicare HMO

## 2021-07-12 VITALS — BP 130/52 | HR 58 | Temp 98.4°F | Resp 18

## 2021-07-12 DIAGNOSIS — C3432 Malignant neoplasm of lower lobe, left bronchus or lung: Secondary | ICD-10-CM | POA: Diagnosis not present

## 2021-07-12 DIAGNOSIS — Z79899 Other long term (current) drug therapy: Secondary | ICD-10-CM | POA: Diagnosis not present

## 2021-07-12 DIAGNOSIS — Z5111 Encounter for antineoplastic chemotherapy: Secondary | ICD-10-CM | POA: Diagnosis not present

## 2021-07-12 MED ORDER — SODIUM CHLORIDE 0.9 % IV SOLN: Freq: Once | INTRAVENOUS | Status: AC

## 2021-07-12 MED ORDER — SODIUM CHLORIDE 0.9% FLUSH
10.0000 mL | INTRAVENOUS | Status: DC | PRN
  Administered 2021-07-12: 10 mL

## 2021-07-12 MED ORDER — TRILACICLIB DIHYDROCHLORIDE INJECTION 300 MG
240.0000 mg/m2 | Freq: Once | INTRAVENOUS | Status: AC
  Administered 2021-07-12: 405 mg via INTRAVENOUS
  Filled 2021-07-12: qty 27

## 2021-07-12 MED ORDER — SODIUM CHLORIDE 0.9 % IV SOLN
100.0000 mg/m2 | Freq: Once | INTRAVENOUS | Status: AC
  Administered 2021-07-12: 170 mg via INTRAVENOUS
  Filled 2021-07-12: qty 8.5

## 2021-07-12 MED ORDER — SODIUM CHLORIDE 0.9 % IV SOLN
10.0000 mg | Freq: Once | INTRAVENOUS | Status: AC
  Administered 2021-07-12: 10 mg via INTRAVENOUS
  Filled 2021-07-12: qty 10

## 2021-07-12 MED ORDER — HEPARIN SOD (PORK) LOCK FLUSH 100 UNIT/ML IV SOLN
500.0000 [IU] | Freq: Once | INTRAVENOUS | Status: AC | PRN
  Administered 2021-07-12: 500 [IU]

## 2021-07-12 NOTE — Patient Instructions (Signed)
[facility]   ?Discharge Instructions: ?Thank you for choosing [facility] to provide your oncology and hematology care.  ? ?If you have a lab appointment with the [facility], please go directly to the [facility]  and check in at the registration area. ?  ?Wear comfortable clothing and clothing appropriate for easy access to any Portacath or PICC line.  ? ?We strive to give you quality time with your provider. You may need to reschedule your appointment if you arrive late (15 or more minutes).  Arriving late affects you and other patients whose appointments are after yours.  Also, if you miss three or more appointments without notifying the office, you may be dismissed from the clinic at the provider?s discretion.    ?  ?For prescription refill requests, have your pharmacy contact our office and allow 72 hours for refills to be completed.   ? ?Today you received the following chemotherapy and/or immunotherapy agents: Etoposide     ?  ?To help prevent nausea and vomiting after your treatment, we encourage you to take your nausea medication as directed. ? ?BELOW ARE SYMPTOMS THAT SHOULD BE REPORTED IMMEDIATELY: ?*FEVER GREATER THAN 100.4 F (38 ?C) OR HIGHER ?*CHILLS OR SWEATING ?*NAUSEA AND VOMITING THAT IS NOT CONTROLLED WITH YOUR NAUSEA MEDICATION ?*UNUSUAL SHORTNESS OF BREATH ?*UNUSUAL BRUISING OR BLEEDING ?*URINARY PROBLEMS (pain or burning when urinating, or frequent urination) ?*BOWEL PROBLEMS (unusual diarrhea, constipation, pain near the anus) ?TENDERNESS IN MOUTH AND THROAT WITH OR WITHOUT PRESENCE OF ULCERS (sore throat, sores in mouth, or a toothache) ?UNUSUAL RASH, SWELLING OR PAIN  ?UNUSUAL VAGINAL DISCHARGE OR ITCHING  ? ?Items with * indicate a potential emergency and should be followed up as soon as possible or go to the Emergency Department if any problems should occur. ? ?Please show the CHEMOTHERAPY ALERT CARD or IMMUNOTHERAPY ALERT CARD at check-in  to the Emergency Department and triage nurse. ? ?Should you have questions after your visit or need to cancel or reschedule your appointment, please contact [facility]  Dept: [phone]  and follow the prompts.  Office hours are 8:00 a.m. to 4:30 p.m. Monday - Friday. Please note that voicemails left after 4:00 p.m. may not be returned until the following business day.  We are closed weekends and major holidays. You have access to a nurse at all times for urgent questions. Please call the main number to the clinic Dept: [phone] and follow the prompts. ? ? ?For any non-urgent questions, you may also contact your provider using MyChart. We now offer e-Visits for anyone 67 and older to request care online for non-urgent symptoms. For details visit [email or web address]. ?  ?Also download the MyChart app! Go to the app store, search "MyChart", open the app, select [facility], and log in with your MyChart username and password. ? ?Due to Covid, a mask is required upon entering the hospital/clinic. If you do not have a mask, one will be given to you upon arrival. For doctor visits, patients may have 1 support person aged 7 or older with them. For treatment visits, patients cannot have anyone with them due to current Covid guidelines and our immunocompromised population.  ? ?

## 2021-07-17 ENCOUNTER — Other Ambulatory Visit: Payer: Self-pay | Admitting: Physician Assistant

## 2021-07-17 ENCOUNTER — Other Ambulatory Visit: Payer: Self-pay

## 2021-07-17 ENCOUNTER — Ambulatory Visit (INDEPENDENT_AMBULATORY_CARE_PROVIDER_SITE_OTHER): Payer: Medicare HMO | Admitting: Internal Medicine

## 2021-07-17 ENCOUNTER — Inpatient Hospital Stay: Payer: Medicare HMO

## 2021-07-17 VITALS — BP 100/58 | HR 58 | Temp 98.0°F | Ht <= 58 in

## 2021-07-17 DIAGNOSIS — R739 Hyperglycemia, unspecified: Secondary | ICD-10-CM

## 2021-07-17 DIAGNOSIS — L89201 Pressure ulcer of unspecified hip, stage 1: Secondary | ICD-10-CM

## 2021-07-17 DIAGNOSIS — D649 Anemia, unspecified: Secondary | ICD-10-CM

## 2021-07-17 DIAGNOSIS — Z95828 Presence of other vascular implants and grafts: Secondary | ICD-10-CM

## 2021-07-17 DIAGNOSIS — I959 Hypotension, unspecified: Secondary | ICD-10-CM | POA: Diagnosis not present

## 2021-07-17 DIAGNOSIS — C3432 Malignant neoplasm of lower lobe, left bronchus or lung: Secondary | ICD-10-CM

## 2021-07-17 DIAGNOSIS — Z5111 Encounter for antineoplastic chemotherapy: Secondary | ICD-10-CM | POA: Diagnosis not present

## 2021-07-17 DIAGNOSIS — Z79899 Other long term (current) drug therapy: Secondary | ICD-10-CM | POA: Diagnosis not present

## 2021-07-17 LAB — CBC WITH DIFFERENTIAL (CANCER CENTER ONLY)
Abs Immature Granulocytes: 0.04 10*3/uL (ref 0.00–0.07)
Basophils Absolute: 0 10*3/uL (ref 0.0–0.1)
Basophils Relative: 1 %
Eosinophils Absolute: 0 10*3/uL (ref 0.0–0.5)
Eosinophils Relative: 1 %
HCT: 26.3 % — ABNORMAL LOW (ref 36.0–46.0)
Hemoglobin: 8.8 g/dL — ABNORMAL LOW (ref 12.0–15.0)
Immature Granulocytes: 2 %
Lymphocytes Relative: 11 %
Lymphs Abs: 0.3 10*3/uL — ABNORMAL LOW (ref 0.7–4.0)
MCH: 31 pg (ref 26.0–34.0)
MCHC: 33.5 g/dL (ref 30.0–36.0)
MCV: 92.6 fL (ref 80.0–100.0)
Monocytes Absolute: 0 10*3/uL — ABNORMAL LOW (ref 0.1–1.0)
Monocytes Relative: 1 %
Neutro Abs: 2.3 10*3/uL (ref 1.7–7.7)
Neutrophils Relative %: 84 %
Platelet Count: 71 10*3/uL — ABNORMAL LOW (ref 150–400)
RBC: 2.84 MIL/uL — ABNORMAL LOW (ref 3.87–5.11)
RDW: 14.3 % (ref 11.5–15.5)
WBC Count: 2.7 10*3/uL — ABNORMAL LOW (ref 4.0–10.5)
nRBC: 0 % (ref 0.0–0.2)

## 2021-07-17 LAB — CMP (CANCER CENTER ONLY)
ALT: 23 U/L (ref 0–44)
AST: 13 U/L — ABNORMAL LOW (ref 15–41)
Albumin: 3.3 g/dL — ABNORMAL LOW (ref 3.5–5.0)
Alkaline Phosphatase: 38 U/L (ref 38–126)
Anion gap: 3 — ABNORMAL LOW (ref 5–15)
BUN: 19 mg/dL (ref 8–23)
CO2: 34 mmol/L — ABNORMAL HIGH (ref 22–32)
Calcium: 8.9 mg/dL (ref 8.9–10.3)
Chloride: 98 mmol/L (ref 98–111)
Creatinine: 0.58 mg/dL (ref 0.44–1.00)
GFR, Estimated: >60 mL/min (ref >60)
Glucose, Bld: 99 mg/dL (ref 70–99)
Potassium: 4.3 mmol/L (ref 3.5–5.1)
Sodium: 135 mmol/L (ref 135–145)
Total Bilirubin: 0.7 mg/dL (ref 0.3–1.2)
Total Protein: 5.6 g/dL — ABNORMAL LOW (ref 6.5–8.1)

## 2021-07-17 MED ORDER — SODIUM CHLORIDE 0.9% FLUSH
10.0000 mL | Freq: Once | INTRAVENOUS | Status: AC
  Administered 2021-07-17: 10 mL

## 2021-07-17 MED ORDER — HEPARIN SOD (PORK) LOCK FLUSH 100 UNIT/ML IV SOLN
500.0000 [IU] | Freq: Once | INTRAVENOUS | Status: AC
  Administered 2021-07-17: 500 [IU]

## 2021-07-17 NOTE — Patient Instructions (Signed)
Remember to take the furosemide(lasix fluid pill) only at HALF pill as needed ? ?Ok to STOP the lisinopril ? ?You will be contacted regarding the referral for: wound care with palliative care for home ? ?Please continue all other medications as before, and refills have been done if requested. ? ?Please have the pharmacy call with any other refills you may need. ? ?Please keep your appointments with your specialists as you may have planned ? ?Please make an Appointment to return in 1 months, or sooner if needed ? ?

## 2021-07-17 NOTE — Progress Notes (Signed)
Patient ID: Dawn Gomez, female   DOB: 1935-09-04, 86 y.o.   MRN: [id number] ? ? ? ?    Chief Complaint: follow up new wounds to back of upper legs, lower BP and leg swelling better ? ?     HPI:  Dawn Gomez is a 86 y.o. female here with family, c/o new onset 1 wk worsening bilat new wounds after sitting more recently to upper post legs, lower BP at home, Pt denies chest pain, increased sob or doe, wheezing, orthopnea, PND, increased LE swelling, palpitations, dizziness or syncope, in fact swelling has been resolved recently as well.  No recent overt bleeding or bruising.   Pt denies fever, wt loss, night sweats, loss of appetite, or other constitutional symptoms  ?      ?Wt Readings from Last 3 Encounters:  ?06/21/21 153 lb 8 oz (69.6 kg)  ?06/19/21 155 lb 8 oz (70.5 kg)  ?06/18/21 155 lb (70.3 kg)  ? ?BP Readings from Last 3 Encounters:  ?07/17/21 (!) 100/58  ?07/12/21 (!) 130/52  ?07/11/21 (!) 106/48  ? ?      ?Past Medical History:  ?Diagnosis Date  ? Abnormal heart rhythm   ? Aortic aneurysm ([facility])   ? Aortic atherosclerosis ([facility]) 03/03/2019  ? Cancer [facility])   ? Coronary artery calcification seen on CT scan 12/18/2020  ? Dyspnea   ? Hypothyroid   ? OSA (obstructive sleep apnea)   ? on cpap  ? Psoriasis 02/05/2017  ? Pulmonary fibrosis ([facility])   ? SVT (supraventricular tachycardia) ([facility])   ? Thyroid disease   ? hypo  ? ?Past Surgical History:  ?Procedure Laterality Date  ? ABDOMINAL HYSTERECTOMY    ? APPENDECTOMY    ? CHOLECYSTECTOMY    ? CRANIOTOMY    ? for aneurysms  ? CRANIOTOMY  1980 x 2  ? aneurysmal clipping  ? HERNIA REPAIR    ? IR IMAGING GUIDED PORT INSERTION  09/15/2020  ? TOTAL ABDOMINAL HYSTERECTOMY    ? TUBAL LIGATION    ? ? reports that she quit smoking about 15 years ago. Her smoking use included cigarettes. She has a 100.00 pack-year smoking history. She has never used smokeless tobacco. She reports current alcohol use. She reports that she does not use drugs. ?family history includes  Allergies in her sister; Bone cancer in her father; Breast cancer in her sister; Cancer in her sister; Clotting disorder in her father and sister; Emphysema in her father, mother, and sister; Heart disease in her father and mother; Lung cancer in her father and mother; Prostate cancer in her father; Rheum arthritis in her father, mother, and sister. ?Allergies  ?Allergen Reactions  ? Lipitor [Atorvastatin] Shortness Of Breath, Diarrhea and Other (See Comments)  ?  Dizzy, pain all over.  ? Codeine   ? Hydromorphone Hcl   ?  REACTION: dementia  ? Levaquin [Levofloxacin In D5w]   ?  Shock per pt but can take cipro  ? Morphine   ? Oxycodone-Acetaminophen   ? Propoxyphene N-Acetaminophen   ? Simvastatin   ? ?Current Outpatient Medications on File Prior to Visit  ?Medication Sig Dispense Refill  ? acetaminophen (TYLENOL) 500 MG tablet Take 500 mg by mouth every 6 (six) hours as needed.    ? Ascorbic Acid (VITAMIN C PO) Take by mouth.    ? Azelastine-Fluticasone 137-50 MCG/ACT SUSP Place 1 spray into the nose every 12 (twelve) hours. 23 g 5  ? Blood Glucose Monitoring Suppl (TRUE METRIX  METER) w/Device KIT USE AS DIRECTED 1 kit 0  ? CALCIUM-MAGNESIUM-ZINC PO Take 1 tablet by mouth daily.    ? cholecalciferol (VITAMIN D) 1000 units tablet Take 5,000 Units by mouth daily.    ? dexamethasone (DECADRON) 4 MG tablet Take 1 tablet (4 mg total) by mouth 2 (two) times daily with a meal. 60 tablet 0  ? diltiazem (CARDIZEM CD) 180 MG 24 hr capsule TAKE 1 CAPSULE EVERY DAY 90 capsule 1  ? escitalopram (LEXAPRO) 20 MG tablet TAKE 1 TABLET AT BEDTIME 90 tablet 2  ? fenofibrate 160 MG tablet TAKE 1 TABLET EVERY DAY 90 tablet 1  ? fluocinonide (LIDEX) 0.05 % external solution APP EXT AA OF SCALP NIGHTLY  3  ? furosemide (LASIX) 80 MG tablet Take 0.5 tablets (40 mg total) by mouth daily as needed. 90 tablet 3  ? glucose blood (TRUE METRIX BLOOD GLUCOSE TEST) test strip TEST  UP  TO FOUR TIMES DAILY AS DIRECTED 100 strip 3  ? guaiFENesin  (MUCINEX) 600 MG 12 hr tablet Take 2 tablets (1,200 mg total) by mouth 2 (two) times daily as needed. 60 tablet 2  ? ipratropium-albuterol (DUONEB) 0.5-2.5 (3) MG/3ML SOLN Take 3 mLs by nebulization every 4 (four) hours as needed. 360 mL 0  ? levothyroxine (SYNTHROID) 100 MCG tablet TAKE 1 TABLET EVERY DAY BEFORE BREAKFAST 90 tablet 2  ? lidocaine-prilocaine (EMLA) cream 30 ?Squeeze  1/2 tsp on a cotton ball and apply to skin over port a cath site. Do not rub it in. Cover with plastic wrap. ?Apply 1-2 hours prior to your treatment. 30 g 0  ? lisinopril (ZESTRIL) 5 MG tablet TAKE 1 TABLET EVERY DAY 90 tablet 3  ? memantine (NAMENDA) 10 MG tablet Take 1 tablet (10 mg total) by mouth 2 (two) times daily. 60 tablet 4  ? memantine (NAMENDA) 5 MG tablet Begin this prescription the first day of brain radiation. Week 1: take one tablet po qam. Week 2: take one tablet qam and qpm. Week 3: take two tablets qam, and one tablet po q pm. Week 4: take two tablets qam and qpm. Fill subsequent prescription q month. 70 tablet 0  ? potassium chloride SA (KLOR-CON M) 20 MEQ tablet TAKE 1 TABLET EVERY DAY WITH FOOD 90 tablet 1  ? prochlorperazine (COMPAZINE) 10 MG tablet Take 1 tablet (10 mg total) by mouth every 6 (six) hours as needed for nausea or vomiting. 30 tablet 0  ? rosuvastatin (CRESTOR) 20 MG tablet TAKE 1 TABLET EVERY DAY 90 tablet 2  ? tiZANidine (ZANAFLEX) 2 MG tablet TAKE 1 TABLET THREE TIMES DAILY 270 tablet 1  ? traMADol (ULTRAM) 50 MG tablet TAKE 1 TABLET BY MOUTH FOUR TIMES DAILY. 360 tablet 1  ? traZODone (DESYREL) 50 MG tablet TAKE 1 TABLET (50 MG TOTAL) BY MOUTH AT BEDTIME AS NEEDED FOR SLEEP. 90 tablet 1  ? TRUEplus Lancets 33G MISC TEST  UP  TO FOUR TIMES DAILY AS DIRECTED 100 each 3  ? vitamin B-12 (CYANOCOBALAMIN) 1000 MCG tablet Take 500 mcg by mouth daily.    ? vitamin C (ASCORBIC ACID) 500 MG tablet Take 500 mg by mouth daily.    ? Vitamin D, Ergocalciferol, (DRISDOL) 50000 units CAPS capsule Take 1 capsule  (50,000 Units total) by mouth every 7 (seven) days. 12 capsule 0  ? vitamin E 400 UNIT capsule Take 200 Units by mouth daily.    ? ?No current facility-administered medications on file prior to visit.  ? ?  ROS:  All others reviewed and negative. ? ?Objective  ? ?     PE:  BP (!) 100/58 (BP Location: Left Arm, Patient Position: Sitting, Cuff Size: Large)   Pulse (!) 58   Temp 98 ?F (36.7 ?C) (Oral)   Ht _0  (1.473 m)   SpO2 99%   BMI 32.08 kg/m?  ? ?              Constitutional: Pt appears in NAD ?              HENT: Head: NCAT.  ?              Right Ear: External ear normal.   ?              Left Ear: External ear normal.  ?              Eyes: . Pupils are equal, round, and reactive to light. Conjunctivae and EOM are normal ?              Nose: without d/c or deformity ?              Neck: Neck supple. Gross normal ROM ?              Cardiovascular: Normal rate and regular rhythm.   ?              Pulmonary/Chest: Effort normal and breath sounds without rales or wheezing.  ?              Abd:  Soft, NT, ND, + BS, no organomegaly ?              Neurological: Pt is alert. At baseline orientation, motor grossly intact ?              Skin: LE edema - none, bilateral upper post legs with grade 1 wounds < 2 cm area each without red, sweling or drainage ?              Psychiatric: Pt behavior is normal without agitation  ? ?Micro: none ? ?Cardiac tracings I have personally interpreted today:  none ? ?Pertinent Radiological findings (summarize): none  ? ?Lab Results  ?Component Value Date  ? WBC 2.7 (L) 07/17/2021  ? HGB 8.8 (L) 07/17/2021  ? HCT 26.3 (L) 07/17/2021  ? PLT 71 (L) 07/17/2021  ? GLUCOSE 99 07/17/2021  ? CHOL 115 08/17/2020  ? TRIG 102.0 08/17/2020  ? HDL 49.80 08/17/2020  ? LDLDIRECT 138.0 02/05/2017  ? LDLCALC 44 08/17/2020  ? ALT 23 07/17/2021  ? AST 13 (L) 07/17/2021  ? NA 135 07/17/2021  ? K 4.3 07/17/2021  ? CL 98 07/17/2021  ? CREATININE 0.58 07/17/2021  ? BUN 19 07/17/2021  ? CO2 34 (H)  07/17/2021  ? TSH 1.05 08/17/2020  ? INR 1.1 08/29/2020  ? HGBA1C 5.4 08/17/2020  ? ?Assessment/Plan:  ?Dawn Gomez is a 86 y.o. White or Caucasian [1] female with  has a past medical history of Abnorma

## 2021-07-22 ENCOUNTER — Encounter: Payer: Self-pay | Admitting: Internal Medicine

## 2021-07-22 DIAGNOSIS — I959 Hypotension, unspecified: Secondary | ICD-10-CM | POA: Insufficient documentation

## 2021-07-22 DIAGNOSIS — L89201 Pressure ulcer of unspecified hip, stage 1: Secondary | ICD-10-CM | POA: Insufficient documentation

## 2021-07-22 NOTE — Assessment & Plan Note (Addendum)
Stable chronic, for f/u lab next visit as well ? ?Lab Results  ?Component Value Date  ? WBC 2.7 (L) 07/17/2021  ? HGB 8.8 (L) 07/17/2021  ? HCT 26.3 (L) 07/17/2021  ? MCV 92.6 07/17/2021  ? PLT 71 (L) 07/17/2021  ? ?

## 2021-07-22 NOTE — Assessment & Plan Note (Signed)
For palliative care consult per pt/family reqeust ?

## 2021-07-22 NOTE — Assessment & Plan Note (Signed)
Ok to d/c lisinopril, and reduce lasix/K to 40 qd + K qd prn only ?

## 2021-07-22 NOTE — Assessment & Plan Note (Signed)
Also for wound care with palliative care consult ?

## 2021-07-22 NOTE — Assessment & Plan Note (Signed)
Lab Results  ?Component Value Date  ? HGBA1C 5.4 08/17/2020  ? ?Stable, pt to continue current medical treatment  - diet ? ?

## 2021-07-23 ENCOUNTER — Telehealth: Payer: Self-pay | Admitting: Internal Medicine

## 2021-07-23 MED ORDER — TRAMADOL HCL 50 MG PO TABS: 50.0000 mg | ORAL_TABLET | Freq: Four times a day (QID) | ORAL | 1 refills | Status: DC

## 2021-07-23 NOTE — Progress Notes (Signed)
? ?                                                                                                                                                          ?  Patient Name: Dawn Gomez ?MRN: [id number] ?DOB: 07-14-1935 ?Referring Physician: Curt Bears (Profile Not Attached) ?Date of Service: 07/06/2021 ?[facility], [place] ? ?                                                      End Of Treatment Note ? ?Diagnoses: C34.32-Malignant neoplasm of lower lobe, left bronchus or lung ?C79.31-Secondary malignant neoplasm of brain ? 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 ?Intent: Palliative ? ?Radiation Treatment Dates: 06/25/2021 through 07/06/2021 ?Site Technique Total Dose (Gy) Dose per Fx (Gy) Completed Fx Beam Energies  ?Brain: Brain Complex 30/30 3 10/10 6X  ? ?Narrative: The patient tolerated radiation therapy relatively well. She was given steroid taper instructions at the conclusion of her therapy. She was also started on Namenda to reduce long term risks of cognitive deficits from whole brain radiation.  ? ?Plan: The patient will receive a call in about one month from the radiation oncology department. She will be followed in the brain oncology program and also continue follow up with Dr. Julien Nordmann as well.  ? ?________________________________________________ ? ? ? ?Carola Rhine, PAC  ?

## 2021-07-23 NOTE — Telephone Encounter (Signed)
1.Medication Requested: ?traMADol (ULTRAM) 50 MG tablet ?2. Pharmacy (Name, [facility]): ?[place], [place] Phone:  [phone]  ?Fax:  [phone]  ?  ?Pt requesting 3 emergency dosages? States she is out of medication. ?3. On Med List: y ? ?4. Last Visit with PCP: ? ?5. Next visit date with PCP: ? ? ?Agent: Please be advised that RX refills may take up to 3 business days. We ask that you follow-up with your pharmacy.  ?

## 2021-07-23 NOTE — Telephone Encounter (Signed)
Ok done erx 

## 2021-07-24 ENCOUNTER — Other Ambulatory Visit: Payer: Medicare HMO

## 2021-07-24 ENCOUNTER — Ambulatory Visit: Payer: Medicare HMO

## 2021-07-24 ENCOUNTER — Ambulatory Visit: Payer: Medicare HMO | Admitting: Physician Assistant

## 2021-07-24 ENCOUNTER — Telehealth: Payer: Self-pay

## 2021-07-24 NOTE — Telephone Encounter (Signed)
Spoke with patient's son Chrissie Noa and scheduled a telephonic Palliative Consult for 07/26/21 @ 1:30 PM.  ? ?Consent obtained; updated Netsmart, Team List and Epic.  ? ?

## 2021-07-25 ENCOUNTER — Ambulatory Visit: Payer: Medicare HMO

## 2021-07-26 ENCOUNTER — Ambulatory Visit: Payer: Medicare HMO

## 2021-07-26 ENCOUNTER — Other Ambulatory Visit: Payer: Self-pay | Admitting: Hospice

## 2021-07-26 DIAGNOSIS — Z515 Encounter for palliative care: Secondary | ICD-10-CM

## 2021-07-26 DIAGNOSIS — J439 Emphysema, unspecified: Secondary | ICD-10-CM

## 2021-07-26 DIAGNOSIS — F0150 Vascular dementia without behavioral disturbance: Secondary | ICD-10-CM

## 2021-07-26 DIAGNOSIS — C3432 Malignant neoplasm of lower lobe, left bronchus or lung: Secondary | ICD-10-CM

## 2021-07-26 NOTE — Progress Notes (Signed)
? ? ?Manufacturing engineer ?Community Palliative Care Consult Note ?Telephone: [phone]  ?Fax: [phone] ? ?PATIENT NAME: Dawn Gomez ?[place] [address] ?[phone] (home)  ?DOB: Apr 01, 1936 ?MRN: [id number] ? ?PRIMARY CARE PROVIDER:    ?Biagio Borg, MD,  ?[place] [address] ?[phone] ? ?REFERRING PROVIDER:   ?Biagio Borg, MD ?[place],  [place] [address] ?[phone] ? ?RESPONSIBLE PARTY:   Self/Kenneth ?Contact Information   ? ? Name Relation Home Work Mobile  ? Sawyer,Carolyn Friend [phone]  [phone]  ? Maliyah, Willets Son [phone]  [phone]  ? Eller,Irene Sister [phone]  [phone]  ? Roach,Georgia    [phone]  ? ?  ? ? ?TELEHEALTH VISIT STATEMENT ?Due to the COVID-19 crisis, this visit was done via telemedicine from my office and it was initiated and consent by this patient and or family.  ?I connected with patient OR PROXY by a telephone/video  and verified that I am speaking with the correct person. I discussed the limitations of evaluation and management by telemedicine. The patient expressed understanding and agreed to proceed. ?Palliative Care was asked to follow this patient to address advance care planning, complex medical decision making and goals of care clarification. NP called Chrissie Noa for additional history as patient had cognitive impairments related to brain metastasis/dementia. This is the initial visit.  ? ?  ASSESSMENT AND / RECOMMENDATIONS:  ? ?Advance Care Planning: Our advance care planning conversation included a discussion about:    ?The value and importance of advance care planning  ?Difference between Hospice and Palliative care ?Exploration of goals of care in the event of a sudden injury or illness  ?Identification and preparation of a healthcare agent  ?Review and updating or creation of an  advance directive document . ?Decision not to resuscitate or to de-escalate  disease focused treatments due to poor prognosis. ? ?CODE STATUS: Chrissie Noa affirmed patient is a DNR. ? ?Goals of Care: Goals include to maximize quality of life and symptom management ?Chrissie Noa requests follow up in a week for further clarification of goals of care. He will have a pre-discussion with patient and family before then; will also discuss if family interested in hospice service in the future.  ? ?I spent 16 minutes providing this initial consultation. More than 50% of the time in this consultation was spent on counseling patient and coordinating communication. ?-------------------------------------------------------------------------------------------------------------------------------------- ? ?Symptom Management/Plan: ?Lung CA: metastasis to the liver and brain. She had whole brain irradiation, currently on chemo. Continue with Oncologist as planned. Continue on oxygen supplementation 4L/Min and breathing treatments as ordered.  ?COPD: Followed by Pulmonologist. Continue oxygen supplementation and breathing treatments as ordered. ?Dementia: Managed with [facility]. Encourage reminiscence, use cueing as needed, search/word puzzles. Optimize ongoing care.  ?Routine CBC CMP ?Follow up: Palliative care will continue to follow for complex medical decision making, advance care planning, and clarification of goals. Return 6 weeks or prn. Encouraged to call provider sooner with any concerns.  ? ?Family /Caregiver/Community Supports: Patient lives at home with her son who coordinates her care. Strong family support system identified.  ? ?HOSPICE ELIGIBILITY/DIAGNOSIS: TBD ? ?Chief Complaint: Initial Palliative care visit ? ?HISTORY OF PRESENT ILLNESS:  Dawn Gomez is a 86 y.o. year old female  with multiple morbidities requiring close monitoring and with high risk of complications and  mortality: Lung CA with metastasis to the liver, brain, COPD, Dementia .  ?History obtained from review of EMR, discussion  with primary team,  caregiver, family and/or Ms. Coven.  ?Review and summarization of Epic records shows history from other than patient. Rest of 10 point ROS asked and negative.  ?Independent interpretation of tests and reviewed as needed, available labs, patient records, imaging, studies and related documents from the EMR. ? ? ?ROS ?General: NAD ?EYES: denies vision changes ?ENMT: denies dysphagia ?Cardiovascular: denies chest pain/discomfort ?Pulmonary: denies cough, endorses SOB on mild to moderate exertion ?Abdomen: endorses good appetite, denies constipation/diarrhea ?GU: denies dysuria, urinary frequency ?MSK:  endorses weakness,  no falls reported ?Skin: denies rashes or wounds ?Neurological: denies pain, denies insomnia ?Psych: Endorses positive mood ?Heme/lymph/immuno: denies bruises, abnormal bleeding ? ? ?PAST MEDICAL HISTORY:  ?Active Ambulatory Problems  ?  Diagnosis Date Noted  ? Interstitial lung disease ([facility]) 08/21/2007  ? Chronic respiratory failure ([facility]) 03/24/2015  ? Hyperlipidemia 07/12/2016  ? SVT (supraventricular tachycardia) ([facility]) 07/12/2016  ? Vitamin D deficiency 07/12/2016  ? Depression 07/12/2016  ? Allergic rhinitis 07/16/2016  ? Chest wall mass 07/16/2016  ? Acute on chronic respiratory failure with hypoxia ([facility]) 08/06/2016  ? Hypothyroidism 08/06/2016  ? Hyperglycemia 08/06/2016  ? Psoriasis 02/05/2017  ? Encounter for well adult exam with abnormal findings 02/05/2017  ? Chest pain 02/05/2017  ? Back pain 02/05/2017  ? Dysuria 07/08/2017  ? Night sweats 07/13/2017  ? Fall 08/13/2017  ? Leg pain 03/25/2018  ? Costochondritis 04/10/2018  ? Sinusitis 08/28/2018  ? Right ankle pain 10/06/2018  ? Healthcare maintenance 02/01/2019  ? Emphysema lung ([facility]) 03/03/2019  ? Aortic atherosclerosis ([facility]) 03/03/2019  ? Right lumbar radiculopathy 12/17/2019  ? Frequent nosebleeds 08/17/2020  ? Small cell carcinoma of lower lobe of left lung ([facility]) 08/31/2020  ? Encounter for antineoplastic chemotherapy  08/31/2020  ? Port-A-Cath in place 09/18/2020  ? Encounter for antineoplastic immunotherapy 09/26/2020  ? Coronary artery calcification seen on CT scan 12/18/2020  ? Cervical radiculitis 05/30/2021  ? Right knee pain 05/30/2021  ? RLQ abdominal pain 05/30/2021  ? Anemia 06/02/2021  ? Pain in right knee 06/07/2021  ? Dehydration 06/18/2021  ? Brain metastases 06/19/2021  ? Pressure injury of thigh, stage 1 07/22/2021  ? Low blood pressure 07/22/2021  ? ?Resolved Ambulatory Problems  ?  Diagnosis Date Noted  ? Other specified hypothyroidism 02/22/2008  ? ABNORMAL HEART RHYTHMS 06/26/2007  ? SLEEP APNEA 06/26/2007  ? DYSPNEA 07/01/2007  ? Hypoxemia 07/01/2007  ? Pulmonary fibrosis ([facility]) 08/06/2016  ? Acute bacterial sinusitis 08/13/2017  ? ?Past Medical History:  ?Diagnosis Date  ? Abnormal heart rhythm   ? Aortic aneurysm ([facility])   ? Cancer [facility])   ? Dyspnea   ? Hypothyroid   ? OSA (obstructive sleep apnea)   ? Thyroid disease   ? ? ?SOCIAL HX:  ?Social History  ? ?Tobacco Use  ? Smoking status: Former  ?  Packs/day: 2.00  ?  Years: 50.00  ?  Pack years: 100.00  ?  Types: Cigarettes  ?  Quit date: 2008  ?  Years since quitting: 15.3  ? Smokeless tobacco: Never  ?Substance Use Topics  ? Alcohol use: Yes  ?  Comment: "drank back in her younger wilder days"  ? ?  ?FAMILY HX:  ?Family History  ?Problem Relation Age of Onset  ? Emphysema Father   ? Heart disease Father   ? Clotting disorder Father   ? Rheum arthritis Father   ? Lung cancer Father   ? Prostate cancer Father   ? Bone cancer Father   ? Emphysema  Sister   ? Emphysema Mother   ? Heart disease Mother   ? Rheum arthritis Mother   ? Lung cancer Mother   ? Allergies Sister   ? Clotting disorder Sister   ? Rheum arthritis Sister   ? Breast cancer Sister   ? Cancer Sister   ?     ureter  ?   ? ?ALLERGIES:  ?Allergies  ?Allergen Reactions  ? Lipitor [Atorvastatin] Shortness Of Breath, Diarrhea and Other (See Comments)  ?  Dizzy, pain all over.  ? Codeine   ?  Hydromorphone Hcl   ?  REACTION: dementia  ? Levaquin [Levofloxacin In D5w]   ?  Shock per pt but can take cipro  ? Morphine   ? Oxycodone-Acetaminophen   ? Propoxyphene N-Acetaminophen   ? Simvastatin   ?   ? ?PERTI

## 2021-07-27 ENCOUNTER — Encounter (HOSPITAL_COMMUNITY): Payer: Self-pay

## 2021-07-27 ENCOUNTER — Ambulatory Visit (HOSPITAL_COMMUNITY)
Admission: RE | Admit: 2021-07-27 | Discharge: 2021-07-27 | Disposition: A | Discharge status: Home / Self Care | Payer: Medicare HMO | Source: Ambulatory Visit | Attending: Internal Medicine | Admitting: Internal Medicine

## 2021-07-27 DIAGNOSIS — C3490 Malignant neoplasm of unspecified part of unspecified bronchus or lung: Secondary | ICD-10-CM | POA: Diagnosis not present

## 2021-07-27 DIAGNOSIS — I700 Atherosclerosis of aorta: Secondary | ICD-10-CM | POA: Diagnosis not present

## 2021-07-27 DIAGNOSIS — K7689 Other specified diseases of liver: Secondary | ICD-10-CM | POA: Diagnosis not present

## 2021-07-27 DIAGNOSIS — J439 Emphysema, unspecified: Secondary | ICD-10-CM | POA: Diagnosis not present

## 2021-07-27 MED ORDER — IOHEXOL 300 MG/ML  SOLN
100.0000 mL | Freq: Once | INTRAMUSCULAR | Status: AC | PRN
  Administered 2021-07-27: 100 mL via INTRAVENOUS

## 2021-07-27 MED ORDER — SODIUM CHLORIDE (PF) 0.9 % IJ SOLN
INTRAMUSCULAR | Status: AC
  Filled 2021-07-27: qty 50

## 2021-07-27 NOTE — Progress Notes (Signed)
[facility] ?OFFICE PROGRESS NOTE ? ?Biagio Borg, MD ?[place] [address] ? ?DIAGNOSIS: Extensive stage (T3, N2, M1 C) small cell lung cancer presented with large left lower lobe lung mass in addition to right hilar and mediastinal lymphadenopathy as well as satellite nodule in the left lower lobe and peripheral pleural lesion as well as metastatic liver lesions diagnosed in May 2022. ? ?PRIOR THERAPY:  ?1) Systemic chemotherapy with carboplatin for AUC of 5 from day 1, etoposide 100 Mg/M2 on days 1, 2 and 3 with Cosela before the chemotherapy every 3 weeks.  Status post 4 cycles.  First cycle started September 18, 2020.  Status post 4 cycles.  The patient did not receive immunotherapy because of the extensive psoriasis.  Last dose of treatment was given on November 20, 2020 ?2)  whole brain irradiation under the care of Dr. Lisbeth Renshaw completed on 07/06/2021 ? ?CURRENT THERAPY: Second line systemic chemotherapy with carboplatin for AUC of 5 on day 1 and etoposide 100 Mg/M2 on days 1, 2 and 3 with Cosela 240 Mg/M2 before chemotherapy every 3 weeks.  First dose June 12, 2021.  Status post 2 cycles. ? ?INTERVAL HISTORY: ?Dawn Gomez 86 y.o. female returns to the clinic today for a follow-up visit accompanied by her friend from [place].  The patient was found to have disease recurrence a few months ago.  The patient is not a good candidate for immunotherapy due to her history of psoriasis.  In between cycle #1 and 2 of her chemotherapy, the patient was found to have metastatic disease to the brain and underwent whole brain radiation under the care of Dr. Lisbeth Renshaw and Bryson Ha. She has been having some issues with her balance and falling at home. Her friend expressed concern that the house was cluttered and is a hazard for falling. She fell last week and is having right sided rib pain. She had a restaging CT scan of the chest a few days later which did not show any evidence of fractures. She  does have significant bruising on her upper extremities. She is on a blood thinner.  She has also been having some headaches since undergoing brain radiation.  She is currently on a Decadron taper and is currently taking 1 tablet of Decadron per day.  Her 1 month follow-up visit is on 08/13/2021. ? ?The patient lives with her son, however, he does work and cannot be home all of the time. The patient fell recently at [place] and was not found until 2:30 PM. She was without her oxygen. The patient does not use assistive devices for ambulation as she does not have space. She also does not have space for a bedside commode. Her PCP has referred her to palliative care. The patient was unable to meet with them for consultation last week because it was on the same day as her restaging CT scan.  Her rescheduled appointment with them is on Thursday of this week which conflicts with her chemotherapy appointment.  They are wondering what the process would look like to have the patient move into a facility that can provide some nursing care. She also has [employer] and is interested in looking into what home health assistance/nursing care is included in her plan.  ? ?The patient today states she is still interested in treatment. Today, she denies any fevers.  She denies any night sweats or chills.  She reports her baseline dyspnea on exertion. She is on 2.5 L  of supplemental oxygen all of the time. She has a chronic cough which produces mucus. Denies any hemoptysis. She takes tramadol for her pain.  She denies any nausea, vomiting, diarrhea, or constipation.  She reports that her appetite is "pretty good".  The patient recently had a restaging CT scan performed.  She is here today for evaluation to review her scan results before starting cycle #3. ? ? ? ? ?MEDICAL HISTORY: ?Past Medical History:  ?Diagnosis Date  ? Abnormal heart rhythm   ? Aortic aneurysm ([facility])   ? Aortic atherosclerosis ([facility]) 03/03/2019  ? Coronary artery calcification  seen on CT scan 12/18/2020  ? Dyspnea   ? Hypothyroid   ? nscl ca 08/2020  ? OSA (obstructive sleep apnea)   ? on cpap  ? Psoriasis 02/05/2017  ? Pulmonary fibrosis ([facility])   ? SVT (supraventricular tachycardia) ([facility])   ? Thyroid disease   ? hypo  ? ? ?ALLERGIES:  is allergic to lipitor [atorvastatin], codeine, hydromorphone hcl, levaquin [levofloxacin in d5w], morphine, oxycodone-acetaminophen, propoxyphene n-acetaminophen, and simvastatin. ? ?MEDICATIONS:  ?Current Outpatient Medications  ?Medication Sig Dispense Refill  ? acetaminophen (TYLENOL) 500 MG tablet Take 500 mg by mouth every 6 (six) hours as needed.    ? Ascorbic Acid (VITAMIN C PO) Take by mouth.    ? Azelastine-Fluticasone 137-50 MCG/ACT SUSP Place 1 spray into the nose every 12 (twelve) hours. 23 g 5  ? Blood Glucose Monitoring Suppl (TRUE METRIX METER) w/Device KIT USE AS DIRECTED 1 kit 0  ? CALCIUM-MAGNESIUM-ZINC PO Take 1 tablet by mouth daily.    ? cholecalciferol (VITAMIN D) 1000 units tablet Take 5,000 Units by mouth daily.    ? dexamethasone (DECADRON) 4 MG tablet Take 1 tablet (4 mg total) by mouth 2 (two) times daily with a meal. 60 tablet 0  ? diltiazem (CARDIZEM CD) 180 MG 24 hr capsule TAKE 1 CAPSULE EVERY DAY 90 capsule 1  ? escitalopram (LEXAPRO) 20 MG tablet TAKE 1 TABLET AT BEDTIME 90 tablet 2  ? fenofibrate 160 MG tablet TAKE 1 TABLET EVERY DAY 90 tablet 1  ? fluocinonide (LIDEX) 0.05 % external solution APP EXT AA OF SCALP NIGHTLY  3  ? furosemide (LASIX) 80 MG tablet Take 0.5 tablets (40 mg total) by mouth daily as needed. 90 tablet 3  ? glucose blood (TRUE METRIX BLOOD GLUCOSE TEST) test strip TEST  UP  TO FOUR TIMES DAILY AS DIRECTED 100 strip 3  ? guaiFENesin (MUCINEX) 600 MG 12 hr tablet Take 2 tablets (1,200 mg total) by mouth 2 (two) times daily as needed. 60 tablet 2  ? ipratropium-albuterol (DUONEB) 0.5-2.5 (3) MG/3ML SOLN Take 3 mLs by nebulization every 4 (four) hours as needed. 360 mL 0  ? levothyroxine (SYNTHROID) 100  MCG tablet TAKE 1 TABLET EVERY DAY BEFORE BREAKFAST 90 tablet 2  ? lidocaine-prilocaine (EMLA) cream 30 ?Squeeze  1/2 tsp on a cotton ball and apply to skin over port a cath site. Do not rub it in. Cover with plastic wrap. ?Apply 1-2 hours prior to your treatment. 30 g 0  ? lisinopril (ZESTRIL) 5 MG tablet TAKE 1 TABLET EVERY DAY 90 tablet 3  ? memantine (NAMENDA) 10 MG tablet Take 1 tablet (10 mg total) by mouth 2 (two) times daily. 60 tablet 4  ? memantine (NAMENDA) 5 MG tablet Begin this prescription the first day of brain radiation. Week 1: take one tablet po qam. Week 2: take one tablet qam and qpm. Week 3: take  two tablets qam, and one tablet po q pm. Week 4: take two tablets qam and qpm. Fill subsequent prescription q month. 70 tablet 0  ? potassium chloride SA (KLOR-CON M) 20 MEQ tablet TAKE 1 TABLET EVERY DAY WITH FOOD 90 tablet 1  ? prochlorperazine (COMPAZINE) 10 MG tablet Take 1 tablet (10 mg total) by mouth every 6 (six) hours as needed for nausea or vomiting. 30 tablet 0  ? rosuvastatin (CRESTOR) 20 MG tablet TAKE 1 TABLET EVERY DAY 90 tablet 2  ? tiZANidine (ZANAFLEX) 2 MG tablet TAKE 1 TABLET THREE TIMES DAILY 270 tablet 1  ? traMADol (ULTRAM) 50 MG tablet Take 1 tablet (50 mg total) by mouth 4 (four) times daily. 360 tablet 1  ? traZODone (DESYREL) 50 MG tablet TAKE 1 TABLET (50 MG TOTAL) BY MOUTH AT BEDTIME AS NEEDED FOR SLEEP. 90 tablet 1  ? TRUEplus Lancets 33G MISC TEST  UP  TO FOUR TIMES DAILY AS DIRECTED 100 each 3  ? vitamin B-12 (CYANOCOBALAMIN) 1000 MCG tablet Take 500 mcg by mouth daily.    ? vitamin C (ASCORBIC ACID) 500 MG tablet Take 500 mg by mouth daily.    ? Vitamin D, Ergocalciferol, (DRISDOL) 50000 units CAPS capsule Take 1 capsule (50,000 Units total) by mouth every 7 (seven) days. 12 capsule 0  ? vitamin E 400 UNIT capsule Take 200 Units by mouth daily.    ? ?No current facility-administered medications for this visit.  ? ? ?SURGICAL HISTORY:  ?Past Surgical History:  ?Procedure  Laterality Date  ? ABDOMINAL HYSTERECTOMY    ? APPENDECTOMY    ? CHOLECYSTECTOMY    ? CRANIOTOMY    ? for aneurysms  ? CRANIOTOMY  1980 x 2  ? aneurysmal clipping  ? HERNIA REPAIR    ? IR IMAGING GUIDED P

## 2021-07-30 ENCOUNTER — Ambulatory Visit: Payer: Medicare HMO | Admitting: Internal Medicine

## 2021-07-31 ENCOUNTER — Inpatient Hospital Stay (HOSPITAL_BASED_OUTPATIENT_CLINIC_OR_DEPARTMENT_OTHER): Payer: Medicare HMO | Admitting: Physician Assistant

## 2021-07-31 ENCOUNTER — Inpatient Hospital Stay: Payer: Medicare HMO

## 2021-07-31 ENCOUNTER — Other Ambulatory Visit: Payer: Medicare HMO

## 2021-07-31 ENCOUNTER — Other Ambulatory Visit: Payer: Self-pay

## 2021-07-31 VITALS — BP 137/66 | HR 57 | Temp 98.0°F | Resp 18

## 2021-07-31 DIAGNOSIS — Z5111 Encounter for antineoplastic chemotherapy: Secondary | ICD-10-CM | POA: Diagnosis not present

## 2021-07-31 DIAGNOSIS — C3432 Malignant neoplasm of lower lobe, left bronchus or lung: Secondary | ICD-10-CM

## 2021-07-31 DIAGNOSIS — D6481 Anemia due to antineoplastic chemotherapy: Secondary | ICD-10-CM

## 2021-07-31 DIAGNOSIS — Z95828 Presence of other vascular implants and grafts: Secondary | ICD-10-CM

## 2021-07-31 DIAGNOSIS — T451X5A Adverse effect of antineoplastic and immunosuppressive drugs, initial encounter: Secondary | ICD-10-CM

## 2021-07-31 DIAGNOSIS — D649 Anemia, unspecified: Secondary | ICD-10-CM

## 2021-07-31 DIAGNOSIS — Z79899 Other long term (current) drug therapy: Secondary | ICD-10-CM | POA: Diagnosis not present

## 2021-07-31 LAB — CBC WITH DIFFERENTIAL (CANCER CENTER ONLY)
Abs Immature Granulocytes: 0.55 10*3/uL — ABNORMAL HIGH (ref 0.00–0.07)
Basophils Absolute: 0 10*3/uL (ref 0.0–0.1)
Basophils Relative: 0 %
Eosinophils Absolute: 0 10*3/uL (ref 0.0–0.5)
Eosinophils Relative: 0 %
HCT: 25.2 % — ABNORMAL LOW (ref 36.0–46.0)
Hemoglobin: 8.3 g/dL — ABNORMAL LOW (ref 12.0–15.0)
Immature Granulocytes: 8 %
Lymphocytes Relative: 5 %
Lymphs Abs: 0.3 10*3/uL — ABNORMAL LOW (ref 0.7–4.0)
MCH: 31.1 pg (ref 26.0–34.0)
MCHC: 32.9 g/dL (ref 30.0–36.0)
MCV: 94.4 fL (ref 80.0–100.0)
Monocytes Absolute: 0.7 10*3/uL (ref 0.1–1.0)
Monocytes Relative: 10 %
Neutro Abs: 5.5 10*3/uL (ref 1.7–7.7)
Neutrophils Relative %: 77 %
Platelet Count: 195 10*3/uL (ref 150–400)
RBC: 2.67 MIL/uL — ABNORMAL LOW (ref 3.87–5.11)
RDW: 17.9 % — ABNORMAL HIGH (ref 11.5–15.5)
Smear Review: NORMAL
WBC Count: 7.1 10*3/uL (ref 4.0–10.5)
nRBC: 1.4 % — ABNORMAL HIGH (ref 0.0–0.2)

## 2021-07-31 LAB — CMP (CANCER CENTER ONLY)
ALT: 29 U/L (ref 0–44)
AST: 16 U/L (ref 15–41)
Albumin: 3.5 g/dL (ref 3.5–5.0)
Alkaline Phosphatase: 50 U/L (ref 38–126)
Anion gap: 6 (ref 5–15)
BUN: 18 mg/dL (ref 8–23)
CO2: 29 mmol/L (ref 22–32)
Calcium: 8.7 mg/dL — ABNORMAL LOW (ref 8.9–10.3)
Chloride: 97 mmol/L — ABNORMAL LOW (ref 98–111)
Creatinine: 0.47 mg/dL (ref 0.44–1.00)
GFR, Estimated: >60 mL/min (ref >60)
Glucose, Bld: 126 mg/dL — ABNORMAL HIGH (ref 70–99)
Potassium: 3.9 mmol/L (ref 3.5–5.1)
Sodium: 132 mmol/L — ABNORMAL LOW (ref 135–145)
Total Bilirubin: 0.5 mg/dL (ref 0.3–1.2)
Total Protein: 5.8 g/dL — ABNORMAL LOW (ref 6.5–8.1)

## 2021-07-31 LAB — SAMPLE TO BLOOD BANK

## 2021-07-31 LAB — PREPARE RBC (CROSSMATCH)

## 2021-07-31 LAB — ABO/RH: ABO/RH(D): O NEG

## 2021-07-31 MED ORDER — SODIUM CHLORIDE 0.9% FLUSH
10.0000 mL | Freq: Once | INTRAVENOUS | Status: AC
  Administered 2021-07-31: 10 mL

## 2021-07-31 MED ORDER — TRILACICLIB DIHYDROCHLORIDE INJECTION 300 MG
240.0000 mg/m2 | Freq: Once | INTRAVENOUS | Status: AC
  Administered 2021-07-31: 405 mg via INTRAVENOUS
  Filled 2021-07-31: qty 27

## 2021-07-31 MED ORDER — SODIUM CHLORIDE 0.9 % IV SOLN
10.0000 mg | Freq: Once | INTRAVENOUS | Status: AC
  Administered 2021-07-31: 10 mg via INTRAVENOUS
  Filled 2021-07-31: qty 10

## 2021-07-31 MED ORDER — PALONOSETRON HCL INJECTION 0.25 MG/5ML
0.2500 mg | Freq: Once | INTRAVENOUS | Status: AC
  Administered 2021-07-31: 0.25 mg via INTRAVENOUS
  Filled 2021-07-31: qty 5

## 2021-07-31 MED ORDER — SODIUM CHLORIDE 0.9 % IV SOLN
356.5000 mg | Freq: Once | INTRAVENOUS | Status: AC
  Administered 2021-07-31: 360 mg via INTRAVENOUS
  Filled 2021-07-31: qty 36

## 2021-07-31 MED ORDER — HEPARIN SOD (PORK) LOCK FLUSH 100 UNIT/ML IV SOLN
500.0000 [IU] | Freq: Once | INTRAVENOUS | Status: AC | PRN
  Administered 2021-07-31: 500 [IU]

## 2021-07-31 MED ORDER — SODIUM CHLORIDE 0.9% FLUSH
10.0000 mL | INTRAVENOUS | Status: DC | PRN
  Administered 2021-07-31: 10 mL

## 2021-07-31 MED ORDER — SODIUM CHLORIDE 0.9 % IV SOLN
150.0000 mg | Freq: Once | INTRAVENOUS | Status: AC
  Administered 2021-07-31: 150 mg via INTRAVENOUS
  Filled 2021-07-31: qty 150

## 2021-07-31 MED ORDER — FAMOTIDINE IN NACL 20-0.9 MG/50ML-% IV SOLN
20.0000 mg | Freq: Once | INTRAVENOUS | Status: AC
  Administered 2021-07-31: 20 mg via INTRAVENOUS
  Filled 2021-07-31: qty 50

## 2021-07-31 MED ORDER — SODIUM CHLORIDE 0.9 % IV SOLN: Freq: Once | INTRAVENOUS | Status: AC

## 2021-07-31 MED ORDER — SODIUM CHLORIDE 0.9 % IV SOLN
100.0000 mg/m2 | Freq: Once | INTRAVENOUS | Status: AC
  Administered 2021-07-31: 170 mg via INTRAVENOUS
  Filled 2021-07-31: qty 8.5

## 2021-07-31 MED ORDER — DIPHENHYDRAMINE HCL 50 MG/ML IJ SOLN
25.0000 mg | Freq: Once | INTRAMUSCULAR | Status: AC
  Administered 2021-07-31: 25 mg via INTRAVENOUS
  Filled 2021-07-31: qty 1

## 2021-07-31 NOTE — Patient Instructions (Addendum)
[facility]  Discharge Instructions: ?Thank you for choosing [facility] to provide your oncology and hematology care.  ? ?If you have a lab appointment with the [facility], please go directly to the [facility] and check in at the registration area. ?  ?Wear comfortable clothing and clothing appropriate for easy access to any Portacath or PICC line.  ? ?We strive to give you quality time with your provider. You may need to reschedule your appointment if you arrive late (15 or more minutes).  Arriving late affects you and other patients whose appointments are after yours.  Also, if you miss three or more appointments without notifying the office, you may be dismissed from the clinic at the provider?s discretion.    ?  ?For prescription refill requests, have your pharmacy contact our office and allow 72 hours for refills to be completed.   ? ?Today you received the following chemotherapy and/or immunotherapy agents: Carboplatin and etoposide ?  ?To help prevent nausea and vomiting after your treatment, we encourage you to take your nausea medication as directed. ? ?BELOW ARE SYMPTOMS THAT SHOULD BE REPORTED IMMEDIATELY: ?*FEVER GREATER THAN 100.4 F (38 ?C) OR HIGHER ?*CHILLS OR SWEATING ?*NAUSEA AND VOMITING THAT IS NOT CONTROLLED WITH YOUR NAUSEA MEDICATION ?*UNUSUAL SHORTNESS OF BREATH ?*UNUSUAL BRUISING OR BLEEDING ?*URINARY PROBLEMS (pain or burning when urinating, or frequent urination) ?*BOWEL PROBLEMS (unusual diarrhea, constipation, pain near the anus) ?TENDERNESS IN MOUTH AND THROAT WITH OR WITHOUT PRESENCE OF ULCERS (sore throat, sores in mouth, or a toothache) ?UNUSUAL RASH, SWELLING OR PAIN  ?UNUSUAL VAGINAL DISCHARGE OR ITCHING  ? ?Items with * indicate a potential emergency and should be followed up as soon as possible or go to the Emergency Department if any problems should occur. ? ?Please show the CHEMOTHERAPY ALERT CARD or IMMUNOTHERAPY ALERT CARD at  check-in to the Emergency Department and triage nurse. ? ?Should you have questions after your visit or need to cancel or reschedule your appointment, please contact [facility]  Dept: [phone]  and follow the prompts.  Office hours are 8:00 a.m. to 4:30 p.m. Monday - Friday. Please note that voicemails left after 4:00 p.m. may not be returned until the following business day.  We are closed weekends and major holidays. You have access to a nurse at all times for urgent questions. Please call the main number to the clinic Dept: [phone] and follow the prompts. ? ? ?For any non-urgent questions, you may also contact your provider using MyChart. We now offer e-Visits for anyone 17 and older to request care online for non-urgent symptoms. For details visit [email or web address]. ?  ?Also download the MyChart app! Go to the app store, search "MyChart", open the app, select [facility], and log in with your MyChart username and password. ? ?Due to Covid, a mask is required upon entering the hospital/clinic. If you do not have a mask, one will be given to you upon arrival. For doctor visits, patients may have 1 support person aged 84 or older with them. For treatment visits, patients cannot have anyone with them due to current Covid guidelines and our immunocompromised population.  ? ?

## 2021-08-01 ENCOUNTER — Inpatient Hospital Stay: Payer: Medicare HMO

## 2021-08-01 VITALS — BP 145/52 | HR 64 | Temp 97.5°F | Resp 18

## 2021-08-01 DIAGNOSIS — C3432 Malignant neoplasm of lower lobe, left bronchus or lung: Secondary | ICD-10-CM

## 2021-08-01 DIAGNOSIS — D6481 Anemia due to antineoplastic chemotherapy: Secondary | ICD-10-CM

## 2021-08-01 DIAGNOSIS — Z5111 Encounter for antineoplastic chemotherapy: Secondary | ICD-10-CM | POA: Diagnosis not present

## 2021-08-01 DIAGNOSIS — Z79899 Other long term (current) drug therapy: Secondary | ICD-10-CM | POA: Diagnosis not present

## 2021-08-01 MED ORDER — SODIUM CHLORIDE 0.9 % IV SOLN: Freq: Once | INTRAVENOUS | Status: AC

## 2021-08-01 MED ORDER — SODIUM CHLORIDE 0.9% IV SOLUTION: 250.0000 mL | Freq: Once | INTRAVENOUS | Status: DC

## 2021-08-01 MED ORDER — SODIUM CHLORIDE 0.9 % IV SOLN
10.0000 mg | Freq: Once | INTRAVENOUS | Status: AC
  Administered 2021-08-01: 10 mg via INTRAVENOUS
  Filled 2021-08-01: qty 10

## 2021-08-01 MED ORDER — DIPHENHYDRAMINE HCL 25 MG PO CAPS
25.0000 mg | ORAL_CAPSULE | Freq: Once | ORAL | Status: AC
  Administered 2021-08-01: 25 mg via ORAL
  Filled 2021-08-01: qty 1

## 2021-08-01 MED ORDER — HEPARIN SOD (PORK) LOCK FLUSH 100 UNIT/ML IV SOLN
500.0000 [IU] | Freq: Once | INTRAVENOUS | Status: AC | PRN
  Administered 2021-08-01: 500 [IU]

## 2021-08-01 MED ORDER — TRILACICLIB DIHYDROCHLORIDE INJECTION 300 MG
240.0000 mg/m2 | Freq: Once | INTRAVENOUS | Status: AC
  Administered 2021-08-01: 405 mg via INTRAVENOUS
  Filled 2021-08-01: qty 27

## 2021-08-01 MED ORDER — SODIUM CHLORIDE 0.9% FLUSH
10.0000 mL | INTRAVENOUS | Status: DC | PRN
  Administered 2021-08-01: 10 mL

## 2021-08-01 MED ORDER — SODIUM CHLORIDE 0.9 % IV SOLN
100.0000 mg/m2 | Freq: Once | INTRAVENOUS | Status: AC
  Administered 2021-08-01: 170 mg via INTRAVENOUS
  Filled 2021-08-01: qty 8.5

## 2021-08-01 NOTE — Patient Instructions (Signed)
[facility]  Discharge Instructions: ?Thank you for choosing [facility] to provide your oncology and hematology care.  ? ?If you have a lab appointment with the [facility], please go directly to the [facility] and check in at the registration area. ?  ?Wear comfortable clothing and clothing appropriate for easy access to any Portacath or PICC line.  ? ?We strive to give you quality time with your provider. You may need to reschedule your appointment if you arrive late (15 or more minutes).  Arriving late affects you and other patients whose appointments are after yours.  Also, if you miss three or more appointments without notifying the office, you may be dismissed from the clinic at the provider?s discretion.    ?  ?For prescription refill requests, have your pharmacy contact our office and allow 72 hours for refills to be completed.   ? ?Today you received the following chemotherapy and/or immunotherapy agents: Etoposide, cosela    ?  ?To help prevent nausea and vomiting after your treatment, we encourage you to take your nausea medication as directed. ? ?BELOW ARE SYMPTOMS THAT SHOULD BE REPORTED IMMEDIATELY: ?*FEVER GREATER THAN 100.4 F (38 ?C) OR HIGHER ?*CHILLS OR SWEATING ?*NAUSEA AND VOMITING THAT IS NOT CONTROLLED WITH YOUR NAUSEA MEDICATION ?*UNUSUAL SHORTNESS OF BREATH ?*UNUSUAL BRUISING OR BLEEDING ?*URINARY PROBLEMS (pain or burning when urinating, or frequent urination) ?*BOWEL PROBLEMS (unusual diarrhea, constipation, pain near the anus) ?TENDERNESS IN MOUTH AND THROAT WITH OR WITHOUT PRESENCE OF ULCERS (sore throat, sores in mouth, or a toothache) ?UNUSUAL RASH, SWELLING OR PAIN  ?UNUSUAL VAGINAL DISCHARGE OR ITCHING  ? ?Items with * indicate a potential emergency and should be followed up as soon as possible or go to the Emergency Department if any problems should occur. ? ?Please show the CHEMOTHERAPY ALERT CARD or IMMUNOTHERAPY ALERT CARD at  check-in to the Emergency Department and triage nurse. ? ?Should you have questions after your visit or need to cancel or reschedule your appointment, please contact [facility]  Dept: [phone]  and follow the prompts.  Office hours are 8:00 a.m. to 4:30 p.m. Monday - Friday. Please note that voicemails left after 4:00 p.m. may not be returned until the following business day.  We are closed weekends and major holidays. You have access to a nurse at all times for urgent questions. Please call the main number to the clinic Dept: [phone] and follow the prompts. ? ? ?For any non-urgent questions, you may also contact your provider using MyChart. We now offer e-Visits for anyone 84 and older to request care online for non-urgent symptoms. For details visit [email or web address]. ?  ?Also download the MyChart app! Go to the app store, search "MyChart", open the app, select [facility], and log in with your MyChart username and password. ? ?Due to Covid, a mask is required upon entering the hospital/clinic. If you do not have a mask, one will be given to you upon arrival. For doctor visits, patients may have 1 support person aged 21 or older with them. For treatment visits, patients cannot have anyone with them due to current Covid guidelines and our immunocompromised population.  ? ?Blood Transfusion, Adult, Care After ?This sheet gives you information about how to care for yourself after your procedure. Your doctor may also give you more specific instructions. If you have problems or questions, contact your doctor. ?What can I expect after the procedure? ?After the procedure, it  is common to have: ?Bruising and soreness at the IV site. ?A headache. ?Follow these instructions at home: ?Insertion site care ? ?  ? ?Follow instructions from your doctor about how to take care of your insertion site. This is where an IV tube was put into your vein. Make sure you: ?Wash your hands with  soap and water before and after you change your bandage (dressing). If you cannot use soap and water, use hand sanitizer. ?Change your bandage as told by your doctor. ?Check your insertion site every day for signs of infection. Check for: ?Redness, swelling, or pain. ?Bleeding from the site. ?Warmth. ?Pus or a bad smell. ?General instructions ?Take over-the-counter and prescription medicines only as told by your doctor. ?Rest as told by your doctor. ?Go back to your normal activities as told by your doctor. ?Keep all follow-up visits as told by your doctor. This is important. ?Contact a doctor if: ?You have itching or red, swollen areas of skin (hives). ?You feel worried or nervous (anxious). ?You feel weak after doing your normal activities. ?You have redness, swelling, warmth, or pain around the insertion site. ?You have blood coming from the insertion site, and the blood does not stop with pressure. ?You have pus or a bad smell coming from the insertion site. ?Get help right away if: ?You have signs of a serious reaction. This may be coming from an allergy or the body's defense system (immune system). Signs include: ?Trouble breathing or shortness of breath. ?Swelling of the face or feeling warm (flushed). ?Fever or chills. ?Head, chest, or back pain. ?Dark pee (urine) or blood in the pee. ?Widespread rash. ?Fast heartbeat. ?Feeling dizzy or light-headed. ?You may receive your blood transfusion in an outpatient setting. If so, you will be told whom to contact to report any reactions. ?These symptoms may be an emergency. Do not wait to see if the symptoms will go away. Get medical help right away. Call your local emergency services (911 in the [place] Do not drive yourself to the hospital. ?Summary ?Bruising and soreness at the IV site are common. ?Check your insertion site every day for signs of infection. ?Rest as told by your doctor. Go back to your normal activities as told by your doctor. ?Get help right away  if you have signs of a serious reaction. ?This information is not intended to replace advice given to you by your health care provider. Make sure you discuss any questions you have with your health care provider. ?Document Revised: 07/20/2020 Document Reviewed: 09/17/2018 ?Elsevier Patient Education ? [place]. ? ? ?

## 2021-08-01 NOTE — Progress Notes (Signed)
Handoff given to Real Cons, RN.  Pt in stable condition, port infusing 1unit PRBC ?

## 2021-08-02 ENCOUNTER — Other Ambulatory Visit: Payer: Self-pay

## 2021-08-02 ENCOUNTER — Other Ambulatory Visit: Payer: Self-pay | Admitting: Hospice

## 2021-08-02 ENCOUNTER — Inpatient Hospital Stay: Payer: Medicare HMO

## 2021-08-02 VITALS — BP 135/59 | HR 54 | Temp 97.7°F | Resp 18 | Wt 129.5 lb

## 2021-08-02 DIAGNOSIS — Z5111 Encounter for antineoplastic chemotherapy: Secondary | ICD-10-CM | POA: Diagnosis not present

## 2021-08-02 DIAGNOSIS — Z79899 Other long term (current) drug therapy: Secondary | ICD-10-CM | POA: Diagnosis not present

## 2021-08-02 DIAGNOSIS — C3432 Malignant neoplasm of lower lobe, left bronchus or lung: Secondary | ICD-10-CM | POA: Diagnosis not present

## 2021-08-02 LAB — TYPE AND SCREEN
ABO/RH(D): O NEG
Antibody Screen: NEGATIVE
Unit division: 0

## 2021-08-02 LAB — BPAM RBC
Blood Product Expiration Date: 202305242359
ISSUE DATE / TIME: 202304261513
Unit Type and Rh: 9500

## 2021-08-02 MED ORDER — SODIUM CHLORIDE 0.9 % IV SOLN
100.0000 mg/m2 | Freq: Once | INTRAVENOUS | Status: AC
  Administered 2021-08-02: 170 mg via INTRAVENOUS
  Filled 2021-08-02: qty 8.5

## 2021-08-02 MED ORDER — HEPARIN SOD (PORK) LOCK FLUSH 100 UNIT/ML IV SOLN
500.0000 [IU] | Freq: Once | INTRAVENOUS | Status: AC | PRN
  Administered 2021-08-02: 500 [IU]

## 2021-08-02 MED ORDER — TRILACICLIB DIHYDROCHLORIDE INJECTION 300 MG
240.0000 mg/m2 | Freq: Once | INTRAVENOUS | Status: AC
  Administered 2021-08-02: 405 mg via INTRAVENOUS
  Filled 2021-08-02: qty 27

## 2021-08-02 MED ORDER — SODIUM CHLORIDE 0.9 % IV SOLN
10.0000 mg | Freq: Once | INTRAVENOUS | Status: AC
  Administered 2021-08-02: 10 mg via INTRAVENOUS
  Filled 2021-08-02: qty 10

## 2021-08-02 MED ORDER — SODIUM CHLORIDE 0.9% FLUSH
10.0000 mL | INTRAVENOUS | Status: DC | PRN
  Administered 2021-08-02: 10 mL

## 2021-08-02 MED ORDER — SODIUM CHLORIDE 0.9 % IV SOLN: Freq: Once | INTRAVENOUS | Status: AC

## 2021-08-02 NOTE — Patient Instructions (Signed)
[facility]  Discharge Instructions: ?Thank you for choosing [facility] to provide your oncology and hematology care.  ? ?If you have a lab appointment with the [facility], please go directly to the [facility] and check in at the registration area. ?  ?Wear comfortable clothing and clothing appropriate for easy access to any Portacath or PICC line.  ? ?We strive to give you quality time with your provider. You may need to reschedule your appointment if you arrive late (15 or more minutes).  Arriving late affects you and other patients whose appointments are after yours.  Also, if you miss three or more appointments without notifying the office, you may be dismissed from the clinic at the provider?s discretion.    ?  ?For prescription refill requests, have your pharmacy contact our office and allow 72 hours for refills to be completed.   ? ?Today you received the following chemotherapy and/or immunotherapy agent: Etoposide    ?  ?To help prevent nausea and vomiting after your treatment, we encourage you to take your nausea medication as directed. ? ?BELOW ARE SYMPTOMS THAT SHOULD BE REPORTED IMMEDIATELY: ?*FEVER GREATER THAN 100.4 F (38 ?C) OR HIGHER ?*CHILLS OR SWEATING ?*NAUSEA AND VOMITING THAT IS NOT CONTROLLED WITH YOUR NAUSEA MEDICATION ?*UNUSUAL SHORTNESS OF BREATH ?*UNUSUAL BRUISING OR BLEEDING ?*URINARY PROBLEMS (pain or burning when urinating, or frequent urination) ?*BOWEL PROBLEMS (unusual diarrhea, constipation, pain near the anus) ?TENDERNESS IN MOUTH AND THROAT WITH OR WITHOUT PRESENCE OF ULCERS (sore throat, sores in mouth, or a toothache) ?UNUSUAL RASH, SWELLING OR PAIN  ?UNUSUAL VAGINAL DISCHARGE OR ITCHING  ? ?Items with * indicate a potential emergency and should be followed up as soon as possible or go to the Emergency Department if any problems should occur. ? ?Please show the CHEMOTHERAPY ALERT CARD or IMMUNOTHERAPY ALERT CARD at check-in to  the Emergency Department and triage nurse. ? ?Should you have questions after your visit or need to cancel or reschedule your appointment, please contact [facility]  Dept: [phone]  and follow the prompts.  Office hours are 8:00 a.m. to 4:30 p.m. Monday - Friday. Please note that voicemails left after 4:00 p.m. may not be returned until the following business day.  We are closed weekends and major holidays. You have access to a nurse at all times for urgent questions. Please call the main number to the clinic Dept: [phone] and follow the prompts. ? ? ?For any non-urgent questions, you may also contact your provider using MyChart. We now offer e-Visits for anyone 22 and older to request care online for non-urgent symptoms. For details visit [email or web address]. ?  ?Also download the MyChart app! Go to the app store, search "MyChart", open the app, select [facility], and log in with your MyChart username and password. ? ?Due to Covid, a mask is required upon entering the hospital/clinic. If you do not have a mask, one will be given to you upon arrival. For doctor visits, patients may have 1 support person aged 44 or older with them. For treatment visits, patients cannot have anyone with them due to current Covid guidelines and our immunocompromised population.  ? ?

## 2021-08-03 ENCOUNTER — Encounter: Payer: Self-pay | Admitting: Internal Medicine

## 2021-08-03 ENCOUNTER — Other Ambulatory Visit: Payer: Self-pay | Admitting: Radiation Therapy

## 2021-08-03 ENCOUNTER — Telehealth: Payer: Self-pay | Admitting: Medical Oncology

## 2021-08-03 DIAGNOSIS — C7931 Secondary malignant neoplasm of brain: Secondary | ICD-10-CM

## 2021-08-03 NOTE — Telephone Encounter (Signed)
"  What is my HGB today'"? ? ?I told Dawn Gomez it was not tested today and reviewed the result from Tuesday and the blood transfusion she received. I told her that after her blood transfusion her HGB should go up to 9.3. She said she felt a little better after the blood transfusion.I instructed her to call for any concerns. She voiced understanding. ?

## 2021-08-06 ENCOUNTER — Other Ambulatory Visit: Payer: Self-pay

## 2021-08-06 ENCOUNTER — Other Ambulatory Visit: Payer: Self-pay | Admitting: Internal Medicine

## 2021-08-06 DIAGNOSIS — C3432 Malignant neoplasm of lower lobe, left bronchus or lung: Secondary | ICD-10-CM

## 2021-08-07 ENCOUNTER — Inpatient Hospital Stay: Payer: Medicare HMO | Attending: Internal Medicine

## 2021-08-07 ENCOUNTER — Other Ambulatory Visit: Payer: Self-pay

## 2021-08-07 DIAGNOSIS — D649 Anemia, unspecified: Secondary | ICD-10-CM | POA: Insufficient documentation

## 2021-08-07 DIAGNOSIS — C787 Secondary malignant neoplasm of liver and intrahepatic bile duct: Secondary | ICD-10-CM | POA: Insufficient documentation

## 2021-08-07 DIAGNOSIS — Z95828 Presence of other vascular implants and grafts: Secondary | ICD-10-CM

## 2021-08-07 DIAGNOSIS — C3432 Malignant neoplasm of lower lobe, left bronchus or lung: Secondary | ICD-10-CM | POA: Insufficient documentation

## 2021-08-07 DIAGNOSIS — Z5111 Encounter for antineoplastic chemotherapy: Secondary | ICD-10-CM | POA: Diagnosis not present

## 2021-08-07 DIAGNOSIS — Z79899 Other long term (current) drug therapy: Secondary | ICD-10-CM | POA: Insufficient documentation

## 2021-08-07 LAB — CBC WITH DIFFERENTIAL (CANCER CENTER ONLY)
Abs Immature Granulocytes: 0.11 10*3/uL — ABNORMAL HIGH (ref 0.00–0.07)
Basophils Absolute: 0 10*3/uL (ref 0.0–0.1)
Basophils Relative: 1 %
Eosinophils Absolute: 0 10*3/uL (ref 0.0–0.5)
Eosinophils Relative: 0 %
HCT: 25.6 % — ABNORMAL LOW (ref 36.0–46.0)
Hemoglobin: 8.7 g/dL — ABNORMAL LOW (ref 12.0–15.0)
Immature Granulocytes: 2 %
Lymphocytes Relative: 3 %
Lymphs Abs: 0.2 10*3/uL — ABNORMAL LOW (ref 0.7–4.0)
MCH: 30.7 pg (ref 26.0–34.0)
MCHC: 34 g/dL (ref 30.0–36.0)
MCV: 90.5 fL (ref 80.0–100.0)
Monocytes Absolute: 0.1 10*3/uL (ref 0.1–1.0)
Monocytes Relative: 2 %
Neutro Abs: 4.6 10*3/uL (ref 1.7–7.7)
Neutrophils Relative %: 92 %
Platelet Count: 176 10*3/uL (ref 150–400)
RBC: 2.83 MIL/uL — ABNORMAL LOW (ref 3.87–5.11)
RDW: 15.9 % — ABNORMAL HIGH (ref 11.5–15.5)
WBC Count: 5 10*3/uL (ref 4.0–10.5)
nRBC: 0 % (ref 0.0–0.2)

## 2021-08-07 LAB — CMP (CANCER CENTER ONLY)
ALT: 22 U/L (ref 0–44)
AST: 13 U/L — ABNORMAL LOW (ref 15–41)
Albumin: 3.4 g/dL — ABNORMAL LOW (ref 3.5–5.0)
Alkaline Phosphatase: 54 U/L (ref 38–126)
Anion gap: 5 (ref 5–15)
BUN: 19 mg/dL (ref 8–23)
CO2: 33 mmol/L — ABNORMAL HIGH (ref 22–32)
Calcium: 8.9 mg/dL (ref 8.9–10.3)
Chloride: 97 mmol/L — ABNORMAL LOW (ref 98–111)
Creatinine: 0.37 mg/dL — ABNORMAL LOW (ref 0.44–1.00)
GFR, Estimated: >60 mL/min (ref >60)
Glucose, Bld: 123 mg/dL — ABNORMAL HIGH (ref 70–99)
Potassium: 4 mmol/L (ref 3.5–5.1)
Sodium: 135 mmol/L (ref 135–145)
Total Bilirubin: 0.4 mg/dL (ref 0.3–1.2)
Total Protein: 5.7 g/dL — ABNORMAL LOW (ref 6.5–8.1)

## 2021-08-07 LAB — SAMPLE TO BLOOD BANK

## 2021-08-07 MED ORDER — SODIUM CHLORIDE 0.9% FLUSH
10.0000 mL | Freq: Once | INTRAVENOUS | Status: AC
  Administered 2021-08-07: 10 mL

## 2021-08-07 MED ORDER — HEPARIN SOD (PORK) LOCK FLUSH 100 UNIT/ML IV SOLN
500.0000 [IU] | Freq: Once | INTRAVENOUS | Status: AC
  Administered 2021-08-07: 500 [IU]

## 2021-08-08 MED ORDER — TRAMADOL HCL 50 MG PO TABS: 50.0000 mg | ORAL_TABLET | Freq: Four times a day (QID) | ORAL | 2 refills | Status: AC | PRN

## 2021-08-12 NOTE — Progress Notes (Signed)
?  Radiation Oncology         [phone] ?________________________________ ? ?Name: Dawn Gomez MRN: [id number]  ?Date of Service: 08/13/2021  DOB: 1935-09-10 ? ?Post Treatment Telephone Note ? 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 ?Intent: Palliative ? ?Radiation Treatment Dates: 06/25/2021 through 07/06/2021 ?Site Technique Total Dose (Gy) Dose per Fx (Gy) Completed Fx Beam Energies  ?Brain: Brain Complex 30/30 3 10/10 6X  ? ?Narrative: The patient tolerated radiation therapy relatively well. She was given steroid taper instructions at the conclusion of her therapy. She was also started on Namenda to reduce long term risks of cognitive deficits from whole brain radiation.  ? ?Impression/Plan: ?1. 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  I was unable to reach the patient but left a voicemail and on the message, I discussed that we  would plan to follow her in the brain oncology conference with repeat CT Scan Brain in 2 months as she cannot have MRI due to her aneurysm clipping.  She will also continue to follow up with Dr. Julien Nordmann in medical oncology.   ? ? ? ? ? ?Carola Rhine, PAC  ? ? ? ? ?

## 2021-08-13 ENCOUNTER — Ambulatory Visit
Admission: RE | Admit: 2021-08-13 | Discharge: 2021-08-13 | Disposition: A | Discharge status: Home / Self Care | Payer: Medicare HMO | Source: Ambulatory Visit | Attending: Radiation Oncology | Admitting: Radiation Oncology

## 2021-08-13 ENCOUNTER — Other Ambulatory Visit: Payer: Self-pay

## 2021-08-13 DIAGNOSIS — C3432 Malignant neoplasm of lower lobe, left bronchus or lung: Secondary | ICD-10-CM

## 2021-08-13 DIAGNOSIS — C7931 Secondary malignant neoplasm of brain: Secondary | ICD-10-CM

## 2021-08-14 ENCOUNTER — Other Ambulatory Visit: Payer: Medicare HMO

## 2021-08-14 ENCOUNTER — Telehealth: Payer: Self-pay | Admitting: Medical Oncology

## 2021-08-14 ENCOUNTER — Ambulatory Visit: Payer: Medicare HMO | Admitting: Internal Medicine

## 2021-08-14 ENCOUNTER — Telehealth: Payer: Self-pay | Admitting: Radiation Oncology

## 2021-08-14 ENCOUNTER — Other Ambulatory Visit: Payer: Self-pay

## 2021-08-14 ENCOUNTER — Inpatient Hospital Stay: Payer: Medicare HMO

## 2021-08-14 ENCOUNTER — Ambulatory Visit: Payer: Medicare HMO

## 2021-08-14 DIAGNOSIS — C3432 Malignant neoplasm of lower lobe, left bronchus or lung: Secondary | ICD-10-CM | POA: Diagnosis not present

## 2021-08-14 DIAGNOSIS — Z5111 Encounter for antineoplastic chemotherapy: Secondary | ICD-10-CM | POA: Diagnosis not present

## 2021-08-14 DIAGNOSIS — C787 Secondary malignant neoplasm of liver and intrahepatic bile duct: Secondary | ICD-10-CM | POA: Diagnosis not present

## 2021-08-14 DIAGNOSIS — Z95828 Presence of other vascular implants and grafts: Secondary | ICD-10-CM

## 2021-08-14 DIAGNOSIS — Z79899 Other long term (current) drug therapy: Secondary | ICD-10-CM | POA: Diagnosis not present

## 2021-08-14 DIAGNOSIS — D649 Anemia, unspecified: Secondary | ICD-10-CM

## 2021-08-14 LAB — CBC WITH DIFFERENTIAL (CANCER CENTER ONLY)
Abs Immature Granulocytes: 0.37 10*3/uL — ABNORMAL HIGH (ref 0.00–0.07)
Basophils Absolute: 0 10*3/uL (ref 0.0–0.1)
Basophils Relative: 1 %
Eosinophils Absolute: 0 10*3/uL (ref 0.0–0.5)
Eosinophils Relative: 0 %
HCT: 26.6 % — ABNORMAL LOW (ref 36.0–46.0)
Hemoglobin: 9.2 g/dL — ABNORMAL LOW (ref 12.0–15.0)
Immature Granulocytes: 11 %
Lymphocytes Relative: 10 %
Lymphs Abs: 0.3 10*3/uL — ABNORMAL LOW (ref 0.7–4.0)
MCH: 31.5 pg (ref 26.0–34.0)
MCHC: 34.6 g/dL (ref 30.0–36.0)
MCV: 91.1 fL (ref 80.0–100.0)
Monocytes Absolute: 0.5 10*3/uL (ref 0.1–1.0)
Monocytes Relative: 16 %
Neutro Abs: 2 10*3/uL (ref 1.7–7.7)
Neutrophils Relative %: 62 %
Platelet Count: 79 10*3/uL — ABNORMAL LOW (ref 150–400)
RBC: 2.92 MIL/uL — ABNORMAL LOW (ref 3.87–5.11)
RDW: 17.8 % — ABNORMAL HIGH (ref 11.5–15.5)
Smear Review: NORMAL
WBC Count: 3.2 10*3/uL — ABNORMAL LOW (ref 4.0–10.5)
nRBC: 2.5 % — ABNORMAL HIGH (ref 0.0–0.2)

## 2021-08-14 LAB — CMP (CANCER CENTER ONLY)
ALT: 43 U/L (ref 0–44)
AST: 23 U/L (ref 15–41)
Albumin: 3.3 g/dL — ABNORMAL LOW (ref 3.5–5.0)
Alkaline Phosphatase: 53 U/L (ref 38–126)
Anion gap: 8 (ref 5–15)
BUN: 20 mg/dL (ref 8–23)
CO2: 29 mmol/L (ref 22–32)
Calcium: 8.7 mg/dL — ABNORMAL LOW (ref 8.9–10.3)
Chloride: 97 mmol/L — ABNORMAL LOW (ref 98–111)
Creatinine: 0.44 mg/dL (ref 0.44–1.00)
GFR, Estimated: >60 mL/min (ref >60)
Glucose, Bld: 115 mg/dL — ABNORMAL HIGH (ref 70–99)
Potassium: 3.7 mmol/L (ref 3.5–5.1)
Sodium: 134 mmol/L — ABNORMAL LOW (ref 135–145)
Total Bilirubin: 0.6 mg/dL (ref 0.3–1.2)
Total Protein: 5.8 g/dL — ABNORMAL LOW (ref 6.5–8.1)

## 2021-08-14 LAB — SAMPLE TO BLOOD BANK

## 2021-08-14 MED ORDER — HEPARIN SOD (PORK) LOCK FLUSH 100 UNIT/ML IV SOLN
500.0000 [IU] | Freq: Once | INTRAVENOUS | Status: AC
  Administered 2021-08-14: 500 [IU]

## 2021-08-14 MED ORDER — SODIUM CHLORIDE 0.9% FLUSH
10.0000 mL | Freq: Once | INTRAVENOUS | Status: AC
  Administered 2021-08-14: 10 mL

## 2021-08-14 NOTE — Telephone Encounter (Signed)
Namenda noncompliant- ? ?Pt's son called me from the lobby to tell me that Zyara stopped taking her Namenda around 03/31, saying " I am not getting radiation so I don' t need to take this pill". ? ?She missed her appt yesterday because she told him she did not have an appt . She did come today for her labs and port flush. ? ?I updated Chrissie Noa re CT scan in 2 months. I gave him the number to call to schedule it .  ? ?I will also arrange for her transportation through the cancer center. He said she can walk out the back door because it is flat ,the front door has steps. ? ?Son requested to call him re appts / information etc.  ?

## 2021-08-14 NOTE — Telephone Encounter (Signed)
E-mail sent to transportation coordinator to enroll pt to use [employer] service. ?

## 2021-08-14 NOTE — Telephone Encounter (Signed)
I received a note from medical oncology that the patient's son was worried she was not taking her Namenda. She and I had been trying to reach each other yesterday.  I tried to reach her son but his phone did not go to voicemail.   ?

## 2021-08-15 ENCOUNTER — Ambulatory Visit: Payer: Medicare HMO

## 2021-08-16 ENCOUNTER — Ambulatory Visit: Payer: Medicare HMO

## 2021-08-17 ENCOUNTER — Other Ambulatory Visit: Payer: Self-pay | Admitting: Medical Oncology

## 2021-08-17 ENCOUNTER — Other Ambulatory Visit: Payer: Self-pay | Admitting: Internal Medicine

## 2021-08-17 DIAGNOSIS — D649 Anemia, unspecified: Secondary | ICD-10-CM

## 2021-08-17 NOTE — Telephone Encounter (Signed)
Ok to let pt know - tramadol already refilled to [facility] may 3 ? ?Refill not done to mail in pharmacy due to already done may 3 ?

## 2021-08-20 ENCOUNTER — Telehealth: Payer: Self-pay | Admitting: Medical Oncology

## 2021-08-20 MED FILL — Dexamethasone Sodium Phosphate Inj 100 MG/10ML: INTRAMUSCULAR | Qty: 1 | Status: AC

## 2021-08-20 MED FILL — Fosaprepitant Dimeglumine For IV Infusion 150 MG (Base Eq): INTRAVENOUS | Qty: 5 | Status: AC

## 2021-08-20 NOTE — Telephone Encounter (Signed)
LVM for son to check pt oxygen to see if she needs a tank now or for tomorrow. ?LVM to [facility] to return my call. ?Called EMS to check on pt.they will dispatch EMS to pt 's residence. ?

## 2021-08-21 ENCOUNTER — Other Ambulatory Visit: Payer: Self-pay | Admitting: Medical Oncology

## 2021-08-21 ENCOUNTER — Inpatient Hospital Stay (HOSPITAL_BASED_OUTPATIENT_CLINIC_OR_DEPARTMENT_OTHER): Payer: Medicare HMO | Admitting: Internal Medicine

## 2021-08-21 ENCOUNTER — Encounter: Payer: Self-pay | Admitting: Internal Medicine

## 2021-08-21 ENCOUNTER — Inpatient Hospital Stay: Payer: Medicare HMO

## 2021-08-21 ENCOUNTER — Other Ambulatory Visit: Payer: Self-pay | Admitting: Radiation Therapy

## 2021-08-21 ENCOUNTER — Telehealth: Payer: Self-pay | Admitting: Medical Oncology

## 2021-08-21 ENCOUNTER — Other Ambulatory Visit: Payer: Self-pay

## 2021-08-21 VITALS — BP 146/67 | HR 68 | Temp 98.0°F | Resp 18 | Wt 157.2 lb

## 2021-08-21 DIAGNOSIS — C3432 Malignant neoplasm of lower lobe, left bronchus or lung: Secondary | ICD-10-CM

## 2021-08-21 DIAGNOSIS — R457 State of emotional shock and stress, unspecified: Secondary | ICD-10-CM

## 2021-08-21 DIAGNOSIS — Z95828 Presence of other vascular implants and grafts: Secondary | ICD-10-CM

## 2021-08-21 DIAGNOSIS — C787 Secondary malignant neoplasm of liver and intrahepatic bile duct: Secondary | ICD-10-CM | POA: Diagnosis not present

## 2021-08-21 DIAGNOSIS — D649 Anemia, unspecified: Secondary | ICD-10-CM | POA: Diagnosis not present

## 2021-08-21 DIAGNOSIS — C3490 Malignant neoplasm of unspecified part of unspecified bronchus or lung: Secondary | ICD-10-CM

## 2021-08-21 DIAGNOSIS — Z5111 Encounter for antineoplastic chemotherapy: Secondary | ICD-10-CM | POA: Diagnosis not present

## 2021-08-21 DIAGNOSIS — Z79899 Other long term (current) drug therapy: Secondary | ICD-10-CM | POA: Diagnosis not present

## 2021-08-21 LAB — CBC WITH DIFFERENTIAL (CANCER CENTER ONLY)
Abs Immature Granulocytes: 1.01 10*3/uL — ABNORMAL HIGH (ref 0.00–0.07)
Basophils Absolute: 0 10*3/uL (ref 0.0–0.1)
Basophils Relative: 0 %
Eosinophils Absolute: 0 10*3/uL (ref 0.0–0.5)
Eosinophils Relative: 0 %
HCT: 29.2 % — ABNORMAL LOW (ref 36.0–46.0)
Hemoglobin: 9.8 g/dL — ABNORMAL LOW (ref 12.0–15.0)
Immature Granulocytes: 9 %
Lymphocytes Relative: 3 %
Lymphs Abs: 0.4 10*3/uL — ABNORMAL LOW (ref 0.7–4.0)
MCH: 31.7 pg (ref 26.0–34.0)
MCHC: 33.6 g/dL (ref 30.0–36.0)
MCV: 94.5 fL (ref 80.0–100.0)
Monocytes Absolute: 0.9 10*3/uL (ref 0.1–1.0)
Monocytes Relative: 8 %
Neutro Abs: 9.4 10*3/uL — ABNORMAL HIGH (ref 1.7–7.7)
Neutrophils Relative %: 80 %
Platelet Count: 166 10*3/uL (ref 150–400)
RBC: 3.09 MIL/uL — ABNORMAL LOW (ref 3.87–5.11)
RDW: 19.9 % — ABNORMAL HIGH (ref 11.5–15.5)
Smear Review: NORMAL
WBC Count: 11.8 10*3/uL — ABNORMAL HIGH (ref 4.0–10.5)
nRBC: 1.4 % — ABNORMAL HIGH (ref 0.0–0.2)

## 2021-08-21 LAB — CMP (CANCER CENTER ONLY)
ALT: 49 U/L — ABNORMAL HIGH (ref 0–44)
AST: 27 U/L (ref 15–41)
Albumin: 3.4 g/dL — ABNORMAL LOW (ref 3.5–5.0)
Alkaline Phosphatase: 59 U/L (ref 38–126)
Anion gap: 5 (ref 5–15)
BUN: 21 mg/dL (ref 8–23)
CO2: 32 mmol/L (ref 22–32)
Calcium: 8.3 mg/dL — ABNORMAL LOW (ref 8.9–10.3)
Chloride: 99 mmol/L (ref 98–111)
Creatinine: 0.45 mg/dL (ref 0.44–1.00)
GFR, Estimated: >60 mL/min (ref >60)
Glucose, Bld: 139 mg/dL — ABNORMAL HIGH (ref 70–99)
Potassium: 3.8 mmol/L (ref 3.5–5.1)
Sodium: 136 mmol/L (ref 135–145)
Total Bilirubin: 0.6 mg/dL (ref 0.3–1.2)
Total Protein: 5.5 g/dL — ABNORMAL LOW (ref 6.5–8.1)

## 2021-08-21 LAB — SAMPLE TO BLOOD BANK

## 2021-08-21 MED ORDER — SODIUM CHLORIDE 0.9% FLUSH
10.0000 mL | INTRAVENOUS | Status: DC | PRN
  Administered 2021-08-21: 10 mL

## 2021-08-21 MED ORDER — HEPARIN SOD (PORK) LOCK FLUSH 100 UNIT/ML IV SOLN
500.0000 [IU] | Freq: Once | INTRAVENOUS | Status: AC | PRN
  Administered 2021-08-21: 500 [IU]

## 2021-08-21 MED ORDER — TRILACICLIB DIHYDROCHLORIDE INJECTION 300 MG
240.0000 mg/m2 | Freq: Once | INTRAVENOUS | Status: AC
  Administered 2021-08-21: 405 mg via INTRAVENOUS
  Filled 2021-08-21: qty 27

## 2021-08-21 MED ORDER — PALONOSETRON HCL INJECTION 0.25 MG/5ML
0.2500 mg | Freq: Once | INTRAVENOUS | Status: AC
  Administered 2021-08-21: 0.25 mg via INTRAVENOUS
  Filled 2021-08-21: qty 5

## 2021-08-21 MED ORDER — DIPHENHYDRAMINE HCL 50 MG/ML IJ SOLN
50.0000 mg | Freq: Once | INTRAMUSCULAR | Status: AC
  Administered 2021-08-21: 50 mg via INTRAVENOUS
  Filled 2021-08-21: qty 1

## 2021-08-21 MED ORDER — SODIUM CHLORIDE 0.9 % IV SOLN
10.0000 mg | Freq: Once | INTRAVENOUS | Status: AC
  Administered 2021-08-21: 10 mg via INTRAVENOUS
  Filled 2021-08-21: qty 10

## 2021-08-21 MED ORDER — SODIUM CHLORIDE 0.9 % IV SOLN
356.5000 mg | Freq: Once | INTRAVENOUS | Status: AC
  Administered 2021-08-21: 360 mg via INTRAVENOUS
  Filled 2021-08-21: qty 36

## 2021-08-21 MED ORDER — SODIUM CHLORIDE 0.9 % IV SOLN
100.0000 mg/m2 | Freq: Once | INTRAVENOUS | Status: AC
  Administered 2021-08-21: 170 mg via INTRAVENOUS
  Filled 2021-08-21: qty 8.5

## 2021-08-21 MED ORDER — SODIUM CHLORIDE 0.9 % IV SOLN
150.0000 mg | Freq: Once | INTRAVENOUS | Status: AC
  Administered 2021-08-21: 150 mg via INTRAVENOUS
  Filled 2021-08-21: qty 150

## 2021-08-21 MED ORDER — SODIUM CHLORIDE 0.9% FLUSH
10.0000 mL | Freq: Once | INTRAVENOUS | Status: AC
  Administered 2021-08-21: 10 mL

## 2021-08-21 MED ORDER — FAMOTIDINE IN NACL 20-0.9 MG/50ML-% IV SOLN
20.0000 mg | Freq: Once | INTRAVENOUS | Status: AC
  Administered 2021-08-21: 20 mg via INTRAVENOUS
  Filled 2021-08-21: qty 50

## 2021-08-21 MED ORDER — SODIUM CHLORIDE 0.9 % IV SOLN: Freq: Once | INTRAVENOUS | Status: AC

## 2021-08-21 MED FILL — Dexamethasone Sodium Phosphate Inj 100 MG/10ML: INTRAMUSCULAR | Qty: 1 | Status: AC

## 2021-08-21 NOTE — Patient Instructions (Signed)
[facility]  Discharge Instructions: ?Thank you for choosing [facility] to provide your oncology and hematology care.  ? ?If you have a lab appointment with the [facility], please go directly to the [facility] and check in at the registration area. ?  ?Wear comfortable clothing and clothing appropriate for easy access to any Portacath or PICC line.  ? ?We strive to give you quality time with your provider. You may need to reschedule your appointment if you arrive late (15 or more minutes).  Arriving late affects you and other patients whose appointments are after yours.  Also, if you miss three or more appointments without notifying the office, you may be dismissed from the clinic at the provider?s discretion.    ?  ?For prescription refill requests, have your pharmacy contact our office and allow 72 hours for refills to be completed.   ? ?Today you received the following chemotherapy and/or immunotherapy agents: Carboplatin and Etoposide    ?  ?To help prevent nausea and vomiting after your treatment, we encourage you to take your nausea medication as directed. ? ?BELOW ARE SYMPTOMS THAT SHOULD BE REPORTED IMMEDIATELY: ?*FEVER GREATER THAN 100.4 F (38 ?C) OR HIGHER ?*CHILLS OR SWEATING ?*NAUSEA AND VOMITING THAT IS NOT CONTROLLED WITH YOUR NAUSEA MEDICATION ?*UNUSUAL SHORTNESS OF BREATH ?*UNUSUAL BRUISING OR BLEEDING ?*URINARY PROBLEMS (pain or burning when urinating, or frequent urination) ?*BOWEL PROBLEMS (unusual diarrhea, constipation, pain near the anus) ?TENDERNESS IN MOUTH AND THROAT WITH OR WITHOUT PRESENCE OF ULCERS (sore throat, sores in mouth, or a toothache) ?UNUSUAL RASH, SWELLING OR PAIN  ?UNUSUAL VAGINAL DISCHARGE OR ITCHING  ? ?Items with * indicate a potential emergency and should be followed up as soon as possible or go to the Emergency Department if any problems should occur. ? ?Please show the CHEMOTHERAPY ALERT CARD or IMMUNOTHERAPY ALERT CARD  at check-in to the Emergency Department and triage nurse. ? ?Should you have questions after your visit or need to cancel or reschedule your appointment, please contact [facility]  Dept: [phone]  and follow the prompts.  Office hours are 8:00 a.m. to 4:30 p.m. Monday - Friday. Please note that voicemails left after 4:00 p.m. may not be returned until the following business day.  We are closed weekends and major holidays. You have access to a nurse at all times for urgent questions. Please call the main number to the clinic Dept: [phone] and follow the prompts. ? ? ?For any non-urgent questions, you may also contact your provider using MyChart. We now offer e-Visits for anyone 2 and older to request care online for non-urgent symptoms. For details visit [email or web address]. ?  ?Also download the MyChart app! Go to the app store, search "MyChart", open the app, select [facility], and log in with your MyChart username and password. ? ?Due to Covid, a mask is required upon entering the hospital/clinic. If you do not have a mask, one will be given to you upon arrival. For doctor visits, patients may have 1 support person aged 65 or older with them. For treatment visits, patients cannot have anyone with them due to current Covid guidelines and our immunocompromised population.  ? ?

## 2021-08-21 NOTE — Progress Notes (Signed)
?    [facility] ?[phone]   [phone] ? ?OFFICE PROGRESS NOTE ? ?Biagio Borg, MD ?[place] [address] ? ?DIAGNOSIS: Extensive stage (T3, N2, M1 C) small cell lung cancer presented with large left lower lobe lung mass in addition to right hilar and mediastinal lymphadenopathy as well as satellite nodule in the left lower lobe and peripheral pleural lesion as well as metastatic liver lesions diagnosed in May 2022. ? ?PRIOR THERAPY: Systemic chemotherapy with carboplatin for AUC of 5 from day 1, etoposide 100 Mg/M2 on days 1, 2 and 3 with Cosela before the chemotherapy every 3 weeks.  Status post 4 cycles.  First cycle started September 18, 2020.  Status post 4 cycles.  The patient did not receive immunotherapy because of the extensive psoriasis.  Last dose of treatment was given on November 20, 2020 ?2) whole brain irradiation under the care of Dr. Lisbeth Renshaw. ? ?CURRENT THERAPY: Second line systemic chemotherapy with carboplatin for AUC of 5 on day 1 and etoposide 100 Mg/M2 on days 1, 2 and 3 with Cosela 240 Mg/M2 before chemotherapy every 3 weeks.  First dose June 12, 2021.  Status post 3 cycles. ? ?INTERVAL HISTORY: ?Dawn Gomez 86 y.o. female returns to the clinic today for follow-up visit.  The patient has been complaining of increasing fatigue and weakness as well as frequent falls and confusion at home.  She also has some horrible headaches.  She was supposed to have repeat CT scan of the head with and without contrast but unfortunately it was not scheduled.  She denied having any chest pain but continues to have the baseline shortness of breath and she is currently on home oxygen.  She lives at home with her son who is also busy with his work.  She is able to do some of her home work.  She denied having any chest pain, cough or hemoptysis.  She has no nausea, vomiting, diarrhea or constipation.  She tolerated the last cycle of her treatment fairly  well.  She is here for evaluation before starting cycle #4. ? ? ?MEDICAL HISTORY: ?Past Medical History:  ?Diagnosis Date  ? Abnormal heart rhythm   ? Aortic aneurysm ([facility])   ? Aortic atherosclerosis ([facility]) 03/03/2019  ? Coronary artery calcification seen on CT scan 12/18/2020  ? Dyspnea   ? Hypothyroid   ? nscl ca 08/2020  ? OSA (obstructive sleep apnea)   ? on cpap  ? Psoriasis 02/05/2017  ? Pulmonary fibrosis ([facility])   ? SVT (supraventricular tachycardia) ([facility])   ? Thyroid disease   ? hypo  ? ? ?ALLERGIES:  is allergic to lipitor [atorvastatin], codeine, hydromorphone hcl, levaquin [levofloxacin in d5w], morphine, oxycodone-acetaminophen, propoxyphene n-acetaminophen, and simvastatin. ? ?MEDICATIONS:  ?Current Outpatient Medications  ?Medication Sig Dispense Refill  ? acetaminophen (TYLENOL) 500 MG tablet Take 500 mg by mouth every 6 (six) hours as needed.    ? Ascorbic Acid (VITAMIN C PO) Take by mouth.    ? Azelastine-Fluticasone 137-50 MCG/ACT SUSP Place 1 spray into the nose every 12 (twelve) hours. 23 g 5  ? Blood Glucose Monitoring Suppl (TRUE METRIX METER) w/Device KIT USE AS DIRECTED 1 kit 0  ? CALCIUM-MAGNESIUM-ZINC PO Take 1 tablet by mouth daily.    ? cholecalciferol (VITAMIN D) 1000 units tablet Take 5,000 Units by mouth daily.    ? dexamethasone (DECADRON) 4 MG tablet Take 1 tablet (4 mg total) by mouth 2 (two) times  daily with a meal. 60 tablet 0  ? diltiazem (CARDIZEM CD) 180 MG 24 hr capsule TAKE 1 CAPSULE EVERY DAY 90 capsule 1  ? escitalopram (LEXAPRO) 20 MG tablet TAKE 1 TABLET AT BEDTIME 90 tablet 2  ? fenofibrate 160 MG tablet TAKE 1 TABLET EVERY DAY 90 tablet 1  ? fluocinonide (LIDEX) 0.05 % external solution APP EXT AA OF SCALP NIGHTLY  3  ? furosemide (LASIX) 80 MG tablet Take 0.5 tablets (40 mg total) by mouth daily as needed. 90 tablet 3  ? glucose blood (TRUE METRIX BLOOD GLUCOSE TEST) test strip TEST  UP  TO FOUR TIMES DAILY AS DIRECTED 100 strip 3  ? guaiFENesin (MUCINEX) 600 MG 12 hr  tablet Take 2 tablets (1,200 mg total) by mouth 2 (two) times daily as needed. 60 tablet 2  ? ipratropium-albuterol (DUONEB) 0.5-2.5 (3) MG/3ML SOLN Take 3 mLs by nebulization every 4 (four) hours as needed. 360 mL 0  ? levothyroxine (SYNTHROID) 100 MCG tablet TAKE 1 TABLET EVERY DAY BEFORE BREAKFAST 90 tablet 2  ? lidocaine-prilocaine (EMLA) cream 30 ?Squeeze  1/2 tsp on a cotton ball and apply to skin over port a cath site. Do not rub it in. Cover with plastic wrap. ?Apply 1-2 hours prior to your treatment. 30 g 0  ? lisinopril (ZESTRIL) 5 MG tablet TAKE 1 TABLET EVERY DAY 90 tablet 3  ? memantine (NAMENDA) 10 MG tablet Take 1 tablet (10 mg total) by mouth 2 (two) times daily. 60 tablet 4  ? memantine (NAMENDA) 5 MG tablet Begin this prescription the first day of brain radiation. Week 1: take one tablet po qam. Week 2: take one tablet qam and qpm. Week 3: take two tablets qam, and one tablet po q pm. Week 4: take two tablets qam and qpm. Fill subsequent prescription q month. 70 tablet 0  ? potassium chloride SA (KLOR-CON M) 20 MEQ tablet TAKE 1 TABLET EVERY DAY WITH FOOD 90 tablet 1  ? prochlorperazine (COMPAZINE) 10 MG tablet Take 1 tablet (10 mg total) by mouth every 6 (six) hours as needed for nausea or vomiting. 30 tablet 0  ? rosuvastatin (CRESTOR) 20 MG tablet TAKE 1 TABLET EVERY DAY 90 tablet 2  ? tiZANidine (ZANAFLEX) 2 MG tablet TAKE 1 TABLET THREE TIMES DAILY 270 tablet 1  ? traMADol (ULTRAM) 50 MG tablet Take 1 tablet (50 mg total) by mouth 4 (four) times daily. 360 tablet 1  ? traMADol (ULTRAM) 50 MG tablet Take 1 tablet (50 mg total) by mouth every 6 (six) hours as needed. 120 tablet 2  ? traZODone (DESYREL) 50 MG tablet TAKE 1 TABLET (50 MG TOTAL) BY MOUTH AT BEDTIME AS NEEDED FOR SLEEP. 90 tablet 1  ? TRUEplus Lancets 33G MISC TEST  UP  TO FOUR TIMES DAILY AS DIRECTED 100 each 3  ? vitamin B-12 (CYANOCOBALAMIN) 1000 MCG tablet Take 500 mcg by mouth daily.    ? vitamin C (ASCORBIC ACID) 500 MG tablet  Take 500 mg by mouth daily.    ? Vitamin D, Ergocalciferol, (DRISDOL) 50000 units CAPS capsule Take 1 capsule (50,000 Units total) by mouth every 7 (seven) days. 12 capsule 0  ? vitamin E 400 UNIT capsule Take 200 Units by mouth daily.    ? ?No current facility-administered medications for this visit.  ? ? ?SURGICAL HISTORY:  ?Past Surgical History:  ?Procedure Laterality Date  ? ABDOMINAL HYSTERECTOMY    ? APPENDECTOMY    ? CHOLECYSTECTOMY    ?  CRANIOTOMY    ? for aneurysms  ? CRANIOTOMY  1980 x 2  ? aneurysmal clipping  ? HERNIA REPAIR    ? IR IMAGING GUIDED PORT INSERTION  09/15/2020  ? TOTAL ABDOMINAL HYSTERECTOMY    ? TUBAL LIGATION    ? ? ?REVIEW OF SYSTEMS:  Constitutional: positive for fatigue ?Eyes: negative ?Ears, nose, mouth, throat, and face: negative ?Respiratory: positive for dyspnea on exertion ?Cardiovascular: negative ?Gastrointestinal: negative ?Genitourinary:negative ?Integument/breast: negative ?Hematologic/lymphatic: negative ?Musculoskeletal:positive for muscle weakness ?Neurological: positive for headaches and confusion ?Behavioral/Psych: negative ?Endocrine: negative ?Allergic/Immunologic: negative  ? ?PHYSICAL EXAMINATION: General appearance: alert, cooperative, appears older than stated age, fatigued, and no distress ?Head: Normocephalic, without obvious abnormality, atraumatic ?Neck: no adenopathy, no JVD, supple, symmetrical, trachea midline, and thyroid not enlarged, symmetric, no tenderness/mass/nodules ?Lymph nodes: Cervical, supraclavicular, and axillary nodes normal. ?Resp: clear to auscultation bilaterally ?Back: symmetric, no curvature. ROM normal. No CVA tenderness. ?Cardio: regular rate and rhythm, S1, S2 normal, no murmur, click, rub or gallop ?GI: soft, non-tender; bowel sounds normal; no masses,  no organomegaly ?Extremities: extremities normal, atraumatic, no cyanosis or edema ?Neurologic: Alert and oriented X 3, normal strength and tone. Normal symmetric reflexes. Normal  coordination and gait ? ?ECOG PERFORMANCE STATUS: 2 - Symptomatic, <50% confined to bed ? ?Blood pressure (!) 146/67, pulse 68, temperature 98 ?F (36.7 ?C), temperature source Tympanic, resp. rate 18, weight

## 2021-08-21 NOTE — Telephone Encounter (Signed)
Referral and records faxed to [facility] care. ?

## 2021-08-22 ENCOUNTER — Encounter: Payer: Self-pay | Admitting: Licensed Clinical Social Worker

## 2021-08-22 ENCOUNTER — Inpatient Hospital Stay: Payer: Medicare HMO

## 2021-08-22 VITALS — BP 132/61 | HR 65 | Temp 99.8°F

## 2021-08-22 DIAGNOSIS — Z79899 Other long term (current) drug therapy: Secondary | ICD-10-CM | POA: Diagnosis not present

## 2021-08-22 DIAGNOSIS — C3432 Malignant neoplasm of lower lobe, left bronchus or lung: Secondary | ICD-10-CM | POA: Diagnosis not present

## 2021-08-22 DIAGNOSIS — C787 Secondary malignant neoplasm of liver and intrahepatic bile duct: Secondary | ICD-10-CM | POA: Diagnosis not present

## 2021-08-22 DIAGNOSIS — D649 Anemia, unspecified: Secondary | ICD-10-CM | POA: Diagnosis not present

## 2021-08-22 DIAGNOSIS — Z5111 Encounter for antineoplastic chemotherapy: Secondary | ICD-10-CM | POA: Diagnosis not present

## 2021-08-22 MED ORDER — SODIUM CHLORIDE 0.9% FLUSH
10.0000 mL | Freq: Once | INTRAVENOUS | Status: AC
  Administered 2021-08-22: 10 mL via INTRAVENOUS

## 2021-08-22 MED ORDER — SODIUM CHLORIDE 0.9 % IV SOLN
10.0000 mg | Freq: Once | INTRAVENOUS | Status: AC
  Administered 2021-08-22: 10 mg via INTRAVENOUS
  Filled 2021-08-22: qty 10

## 2021-08-22 MED ORDER — SODIUM CHLORIDE 0.9 % IV SOLN: Freq: Once | INTRAVENOUS | Status: AC

## 2021-08-22 MED ORDER — HEPARIN SOD (PORK) LOCK FLUSH 100 UNIT/ML IV SOLN
500.0000 [IU] | Freq: Once | INTRAVENOUS | Status: AC
  Administered 2021-08-22: 500 [IU] via INTRAVENOUS

## 2021-08-22 MED ORDER — SODIUM CHLORIDE 0.9 % IV SOLN
100.0000 mg/m2 | Freq: Once | INTRAVENOUS | Status: AC
  Administered 2021-08-22: 170 mg via INTRAVENOUS
  Filled 2021-08-22: qty 8.5

## 2021-08-22 MED ORDER — TRILACICLIB DIHYDROCHLORIDE INJECTION 300 MG
240.0000 mg/m2 | Freq: Once | INTRAVENOUS | Status: AC
  Administered 2021-08-22: 405 mg via INTRAVENOUS
  Filled 2021-08-22: qty 27

## 2021-08-22 MED FILL — Dexamethasone Sodium Phosphate Inj 100 MG/10ML: INTRAMUSCULAR | Qty: 1 | Status: AC

## 2021-08-22 NOTE — Progress Notes (Signed)
[facility] CSW Progress Note ? ?Clinical Social Worker met with patient to discuss concerns regarding home conditions.  Pt previously seen by social work when pt had concerns regarding eviction.  Pt informed writer she continues to reside w/ her son Kenny ([phone]) who works full time and is not able to provide her with full time care.  Pt states she has declined offers for a home care referral in the past due to the condition of her home.  According to pt both she and her son have collected items over the years which has made the home "very cluttered".  Pt states she has been embarrassed at the state of the home, but as she is frail and oxygen dependent she is no longer able to clean the home herself.  Pt accepted a home care referral at this doctor's visit which was sent to [facility].  CSW attempted to contact [facility] ([phone]) to discuss the state of pt's home, as well as pt's willingness at this time to allow the home to be cleaned.  CSW informed the referral has not yet been received.  CSW informed RN who will follow up with referral.  CSW to follow up w/ [facility] once referral is processed.  Contact information for CSW given to pt.  CSW to continue to follow pt throughout duration of treatment as appropriate.  ? ? ? ? R , LCSW  ? ? ? ?

## 2021-08-22 NOTE — Patient Instructions (Signed)
[facility]  Discharge Instructions: ?Thank you for choosing [facility] to provide your oncology and hematology care.  ? ?If you have a lab appointment with the [facility], please go directly to the [facility] and check in at the registration area. ?  ?Wear comfortable clothing and clothing appropriate for easy access to any Portacath or PICC line.  ? ?We strive to give you quality time with your provider. You may need to reschedule your appointment if you arrive late (15 or more minutes).  Arriving late affects you and other patients whose appointments are after yours.  Also, if you miss three or more appointments without notifying the office, you may be dismissed from the clinic at the provider?s discretion.    ?  ?For prescription refill requests, have your pharmacy contact our office and allow 72 hours for refills to be completed.   ? ?Today you received the following chemotherapy and/or immunotherapy agents: Cosela, Etoposide.     ?  ?To help prevent nausea and vomiting after your treatment, we encourage you to take your nausea medication as directed. ? ?BELOW ARE SYMPTOMS THAT SHOULD BE REPORTED IMMEDIATELY: ?*FEVER GREATER THAN 100.4 F (38 ?C) OR HIGHER ?*CHILLS OR SWEATING ?*NAUSEA AND VOMITING THAT IS NOT CONTROLLED WITH YOUR NAUSEA MEDICATION ?*UNUSUAL SHORTNESS OF BREATH ?*UNUSUAL BRUISING OR BLEEDING ?*URINARY PROBLEMS (pain or burning when urinating, or frequent urination) ?*BOWEL PROBLEMS (unusual diarrhea, constipation, pain near the anus) ?TENDERNESS IN MOUTH AND THROAT WITH OR WITHOUT PRESENCE OF ULCERS (sore throat, sores in mouth, or a toothache) ?UNUSUAL RASH, SWELLING OR PAIN  ?UNUSUAL VAGINAL DISCHARGE OR ITCHING  ? ?Items with * indicate a potential emergency and should be followed up as soon as possible or go to the Emergency Department if any problems should occur. ? ?Please show the CHEMOTHERAPY ALERT CARD or IMMUNOTHERAPY ALERT CARD at  check-in to the Emergency Department and triage nurse. ? ?Should you have questions after your visit or need to cancel or reschedule your appointment, please contact [facility]  Dept: [phone]  and follow the prompts.  Office hours are 8:00 a.m. to 4:30 p.m. Monday - Friday. Please note that voicemails left after 4:00 p.m. may not be returned until the following business day.  We are closed weekends and major holidays. You have access to a nurse at all times for urgent questions. Please call the main number to the clinic Dept: [phone] and follow the prompts. ? ? ?For any non-urgent questions, you may also contact your provider using MyChart. We now offer e-Visits for anyone 28 and older to request care online for non-urgent symptoms. For details visit [email or web address]. ?  ?Also download the MyChart app! Go to the app store, search "MyChart", open the app, select [facility], and log in with your MyChart username and password. ? ?Due to Covid, a mask is required upon entering the hospital/clinic. If you do not have a mask, one will be given to you upon arrival. For doctor visits, patients may have 1 support person aged 53 or older with them. For treatment visits, patients cannot have anyone with them due to current Covid guidelines and our immunocompromised population.  ? ?

## 2021-08-23 ENCOUNTER — Ambulatory Visit (HOSPITAL_COMMUNITY): Payer: Medicare HMO

## 2021-08-23 ENCOUNTER — Inpatient Hospital Stay: Payer: Medicare HMO

## 2021-08-23 ENCOUNTER — Other Ambulatory Visit: Payer: Self-pay

## 2021-08-23 VITALS — BP 149/69 | HR 68 | Temp 98.4°F

## 2021-08-23 DIAGNOSIS — C3432 Malignant neoplasm of lower lobe, left bronchus or lung: Secondary | ICD-10-CM

## 2021-08-23 DIAGNOSIS — Z79899 Other long term (current) drug therapy: Secondary | ICD-10-CM | POA: Diagnosis not present

## 2021-08-23 DIAGNOSIS — Z5111 Encounter for antineoplastic chemotherapy: Secondary | ICD-10-CM | POA: Diagnosis not present

## 2021-08-23 DIAGNOSIS — D649 Anemia, unspecified: Secondary | ICD-10-CM | POA: Diagnosis not present

## 2021-08-23 DIAGNOSIS — C787 Secondary malignant neoplasm of liver and intrahepatic bile duct: Secondary | ICD-10-CM | POA: Diagnosis not present

## 2021-08-23 MED ORDER — SODIUM CHLORIDE 0.9% FLUSH
10.0000 mL | INTRAVENOUS | Status: DC | PRN
  Administered 2021-08-23: 10 mL

## 2021-08-23 MED ORDER — TRILACICLIB DIHYDROCHLORIDE INJECTION 300 MG
240.0000 mg/m2 | Freq: Once | INTRAVENOUS | Status: AC
  Administered 2021-08-23: 405 mg via INTRAVENOUS
  Filled 2021-08-23: qty 27

## 2021-08-23 MED ORDER — SODIUM CHLORIDE 0.9 % IV SOLN
10.0000 mg | Freq: Once | INTRAVENOUS | Status: AC
  Administered 2021-08-23: 10 mg via INTRAVENOUS
  Filled 2021-08-23: qty 10

## 2021-08-23 MED ORDER — SODIUM CHLORIDE 0.9 % IV SOLN
100.0000 mg/m2 | Freq: Once | INTRAVENOUS | Status: AC
  Administered 2021-08-23: 170 mg via INTRAVENOUS
  Filled 2021-08-23: qty 8.5

## 2021-08-23 MED ORDER — HEPARIN SOD (PORK) LOCK FLUSH 100 UNIT/ML IV SOLN
500.0000 [IU] | Freq: Once | INTRAVENOUS | Status: AC | PRN
  Administered 2021-08-23: 500 [IU]

## 2021-08-23 MED ORDER — SODIUM CHLORIDE 0.9 % IV SOLN: Freq: Once | INTRAVENOUS | Status: AC

## 2021-08-23 NOTE — Patient Instructions (Signed)
[facility]  Discharge Instructions: Thank you for choosing [facility] to provide your oncology and hematology care.   If you have a lab appointment with the [facility], please go directly to the [facility] and check in at the registration area.   Wear comfortable clothing and clothing appropriate for easy access to any Portacath or PICC line.   We strive to give you quality time with your provider. You may need to reschedule your appointment if you arrive late (15 or more minutes).  Arriving late affects you and other patients whose appointments are after yours.  Also, if you miss three or more appointments without notifying the office, you may be dismissed from the clinic at the provider's discretion.      For prescription refill requests, have your pharmacy contact our office and allow 72 hours for refills to be completed.    Today you received the following chemotherapy and/or immunotherapy agents: Etoposide      To help prevent nausea and vomiting after your treatment, we encourage you to take your nausea medication as directed.  BELOW ARE SYMPTOMS THAT SHOULD BE REPORTED IMMEDIATELY: *FEVER GREATER THAN 100.4 F (38 C) OR HIGHER *CHILLS OR SWEATING *NAUSEA AND VOMITING THAT IS NOT CONTROLLED WITH YOUR NAUSEA MEDICATION *UNUSUAL SHORTNESS OF BREATH *UNUSUAL BRUISING OR BLEEDING *URINARY PROBLEMS (pain or burning when urinating, or frequent urination) *BOWEL PROBLEMS (unusual diarrhea, constipation, pain near the anus) TENDERNESS IN MOUTH AND THROAT WITH OR WITHOUT PRESENCE OF ULCERS (sore throat, sores in mouth, or a toothache) UNUSUAL RASH, SWELLING OR PAIN  UNUSUAL VAGINAL DISCHARGE OR ITCHING   Items with * indicate a potential emergency and should be followed up as soon as possible or go to the Emergency Department if any problems should occur.  Please show the CHEMOTHERAPY ALERT CARD or IMMUNOTHERAPY ALERT CARD at check-in to  the Emergency Department and triage nurse.  Should you have questions after your visit or need to cancel or reschedule your appointment, please contact [facility]  Dept: [phone]  and follow the prompts.  Office hours are 8:00 a.m. to 4:30 p.m. Monday - Friday. Please note that voicemails left after 4:00 p.m. may not be returned until the following business day.  We are closed weekends and major holidays. You have access to a nurse at all times for urgent questions. Please call the main number to the clinic Dept: [phone] and follow the prompts.   For any non-urgent questions, you may also contact your provider using MyChart. We now offer e-Visits for anyone 89 and older to request care online for non-urgent symptoms. For details visit [email or web address].   Also download the MyChart app! Go to the app store, search "MyChart", open the app, select [facility], and log in with your MyChart username and password.  Due to Covid, a mask is required upon entering the hospital/clinic. If you do not have a mask, one will be given to you upon arrival. For doctor visits, patients may have 1 support person aged 49 or older with them. For treatment visits, patients cannot have anyone with them due to current Covid guidelines and our immunocompromised population.

## 2021-08-24 ENCOUNTER — Encounter: Payer: Self-pay | Admitting: Licensed Clinical Social Worker

## 2021-08-24 DIAGNOSIS — C3432 Malignant neoplasm of lower lobe, left bronchus or lung: Secondary | ICD-10-CM

## 2021-08-24 NOTE — Progress Notes (Signed)
[facility] CSW Progress Note  Clinical Education officer, museum  informed by [facility] they have declined pt's referral due  to staffing issues.   CSW informed by RN referral will be forwarded to [facility].  CSW to follow up w/ Alvis Lemmings once referral is accepted.     Henriette Combs, LCSW

## 2021-08-24 NOTE — Telephone Encounter (Signed)
Corene Cornea with [facility] has advised they are not able to accept pt at this time due to staffing. He stated [facility] Lemm[facility] is able to accept the pt and has forwarded the information there on 08/22/21.  Alvis Lemmings contactAdela Lank [place]: [phone]

## 2021-08-27 ENCOUNTER — Inpatient Hospital Stay: Payer: Medicare HMO

## 2021-08-27 ENCOUNTER — Encounter: Payer: Self-pay | Admitting: Licensed Clinical Social Worker

## 2021-08-27 DIAGNOSIS — C3432 Malignant neoplasm of lower lobe, left bronchus or lung: Secondary | ICD-10-CM

## 2021-08-27 NOTE — Progress Notes (Signed)
[facility] CSW Progress Note  Clinical Social Worker  spoke w/ Adela Lank [phone]) at [facility] who confirmed acceptance of home care referral.  Per Jeani Hawking someone from scheduling will reach out to pt today to set up services.  Cory informed of pt's concerns regarding the cluttered condition of the home.  CSW to remain available to address any needs which may arise.     Henriette Combs, LCSW

## 2021-08-29 ENCOUNTER — Telehealth: Payer: Self-pay | Admitting: Radiation Therapy

## 2021-08-29 ENCOUNTER — Telehealth: Payer: Self-pay | Admitting: *Deleted

## 2021-08-29 ENCOUNTER — Ambulatory Visit: Payer: Medicare HMO | Admitting: Internal Medicine

## 2021-08-29 ENCOUNTER — Telehealth: Payer: Self-pay | Admitting: Internal Medicine

## 2021-08-29 VITALS — BP 130/74 | HR 82 | Temp 98.0°F | Ht <= 58 in | Wt 151.0 lb

## 2021-08-29 DIAGNOSIS — F3341 Major depressive disorder, recurrent, in partial remission: Secondary | ICD-10-CM

## 2021-08-29 DIAGNOSIS — E559 Vitamin D deficiency, unspecified: Secondary | ICD-10-CM

## 2021-08-29 DIAGNOSIS — W19XXXA Unspecified fall, initial encounter: Secondary | ICD-10-CM

## 2021-08-29 DIAGNOSIS — E039 Hypothyroidism, unspecified: Secondary | ICD-10-CM

## 2021-08-29 DIAGNOSIS — R739 Hyperglycemia, unspecified: Secondary | ICD-10-CM | POA: Diagnosis not present

## 2021-08-29 NOTE — Progress Notes (Signed)
Patient ID: Dawn Gomez, female   DOB: 1935/08/01, 86 y.o.   MRN: [id number]        Chief Complaint: follow up recent fall       HPI:  Dawn Gomez is a 86 y.o. female here with son, lives by herself without life alert, unfortunately fell and was on the floor 3 days ago overnight on her back for about 8 hrs off o2 b/c it fell off and she could not reach it; son found her, no injury or other complications apparently.  Pt denies chest pain, increased sob or doe, wheezing, orthopnea, PND, increased LE swelling, palpitations, dizziness or syncope.   Pt denies fever, wt loss, night sweats, loss of appetite, or other constitutional symptoms   Pt denies polydipsia, polyuria, or new focal neuro s/s.  Denies hyper or hypo thyroid symptoms such as voice, skin or hair change.  Denies worsening depressive symptoms, suicidal ideation, or panic.  Due for lab f/u mar 25        Wt Readings from Last 3 Encounters:  08/29/21 151 lb (68.5 kg)  08/21/21 157 lb 4 oz (71.3 kg)  08/02/21 129 lb 8 oz (58.7 kg)   BP Readings from Last 3 Encounters:  08/30/21 134/63  08/29/21 130/74  08/23/21 (!) 149/69         Past Medical History:  Diagnosis Date   Abnormal heart rhythm    Aortic aneurysm (HCC)    Aortic atherosclerosis ([place]) 03/03/2019   Coronary artery calcification seen on CT scan 12/18/2020   Dyspnea    Hypothyroid    nscl ca 08/2020   OSA (obstructive sleep apnea)    on cpap   Psoriasis 02/05/2017   Pulmonary fibrosis (HCC)    SVT (supraventricular tachycardia) ([place])    Thyroid disease    hypo   Past Surgical History:  Procedure Laterality Date   ABDOMINAL HYSTERECTOMY     APPENDECTOMY     CHOLECYSTECTOMY     CRANIOTOMY     for aneurysms   CRANIOTOMY  1980 x 2   aneurysmal clipping   HERNIA REPAIR     IR IMAGING GUIDED PORT INSERTION  09/15/2020   TOTAL ABDOMINAL HYSTERECTOMY     TUBAL LIGATION      reports that she quit smoking about 15 years ago. Her smoking use included  cigarettes. She has a 100.00 pack-year smoking history. She has never used smokeless tobacco. She reports current alcohol use. She reports that she does not use drugs. family history includes Allergies in her sister; Bone cancer in her father; Breast cancer in her sister; Cancer in her sister; Clotting disorder in her father and sister; Emphysema in her father, mother, and sister; Heart disease in her father and mother; Lung cancer in her father and mother; Prostate cancer in her father; Rheum arthritis in her father, mother, and sister. Allergies  Allergen Reactions   Lipitor [Atorvastatin] Shortness Of Breath, Diarrhea and Other (See Comments)    Dizzy, pain all over.   Codeine    Hydromorphone Hcl     REACTION: dementia   Levaquin [Levofloxacin In D5w]     Shock per pt but can take cipro   Morphine    Oxycodone-Acetaminophen    Propoxyphene N-Acetaminophen    Simvastatin    Current Outpatient Medications on File Prior to Visit  Medication Sig Dispense Refill   acetaminophen (TYLENOL) 500 MG tablet Take 500 mg by mouth every 6 (six) hours as needed.  Ascorbic Acid (VITAMIN C PO) Take by mouth.     Azelastine-Fluticasone 137-50 MCG/ACT SUSP Place 1 spray into the nose every 12 (twelve) hours. 23 g 5   Blood Glucose Monitoring Suppl (TRUE METRIX METER) w/Device KIT USE AS DIRECTED 1 kit 0   CALCIUM-MAGNESIUM-ZINC PO Take 1 tablet by mouth daily.     cholecalciferol (VITAMIN D) 1000 units tablet Take 5,000 Units by mouth daily.     dexamethasone (DECADRON) 4 MG tablet Take 1 tablet (4 mg total) by mouth 2 (two) times daily with a meal. 60 tablet 0   diltiazem (CARDIZEM CD) 180 MG 24 hr capsule TAKE 1 CAPSULE EVERY DAY 90 capsule 1   escitalopram (LEXAPRO) 20 MG tablet TAKE 1 TABLET AT BEDTIME 90 tablet 2   fenofibrate 160 MG tablet TAKE 1 TABLET EVERY DAY 90 tablet 1   fluocinonide (LIDEX) 0.05 % external solution APP EXT AA OF SCALP NIGHTLY  3   furosemide (LASIX) 80 MG tablet Take  0.5 tablets (40 mg total) by mouth daily as needed. 90 tablet 3   glucose blood (TRUE METRIX BLOOD GLUCOSE TEST) test strip TEST  UP  TO FOUR TIMES DAILY AS DIRECTED 100 strip 3   guaiFENesin (MUCINEX) 600 MG 12 hr tablet Take 2 tablets (1,200 mg total) by mouth 2 (two) times daily as needed. 60 tablet 2   ipratropium-albuterol (DUONEB) 0.5-2.5 (3) MG/3ML SOLN Take 3 mLs by nebulization every 4 (four) hours as needed. 360 mL 0   levothyroxine (SYNTHROID) 100 MCG tablet TAKE 1 TABLET EVERY DAY BEFORE BREAKFAST 90 tablet 2   lidocaine-prilocaine (EMLA) cream 30 Squeeze  1/2 tsp on a cotton ball and apply to skin over port a cath site. Do not rub it in. Cover with plastic wrap. Apply 1-2 hours prior to your treatment. 30 g 0   lisinopril (ZESTRIL) 5 MG tablet TAKE 1 TABLET EVERY DAY 90 tablet 3   memantine (NAMENDA) 10 MG tablet Take 1 tablet (10 mg total) by mouth 2 (two) times daily. 60 tablet 4   memantine (NAMENDA) 5 MG tablet Begin this prescription the first day of brain radiation. Week 1: take one tablet po qam. Week 2: take one tablet qam and qpm. Week 3: take two tablets qam, and one tablet po q pm. Week 4: take two tablets qam and qpm. Fill subsequent prescription q month. 70 tablet 0   potassium chloride SA (KLOR-CON M) 20 MEQ tablet TAKE 1 TABLET EVERY DAY WITH FOOD 90 tablet 1   prochlorperazine (COMPAZINE) 10 MG tablet Take 1 tablet (10 mg total) by mouth every 6 (six) hours as needed for nausea or vomiting. 30 tablet 0   rosuvastatin (CRESTOR) 20 MG tablet TAKE 1 TABLET EVERY DAY 90 tablet 2   tiZANidine (ZANAFLEX) 2 MG tablet TAKE 1 TABLET THREE TIMES DAILY 270 tablet 1   traMADol (ULTRAM) 50 MG tablet Take 1 tablet (50 mg total) by mouth 4 (four) times daily. 360 tablet 1   traMADol (ULTRAM) 50 MG tablet Take 1 tablet (50 mg total) by mouth every 6 (six) hours as needed. 120 tablet 2   traZODone (DESYREL) 50 MG tablet TAKE 1 TABLET (50 MG TOTAL) BY MOUTH AT BEDTIME AS NEEDED FOR SLEEP.  90 tablet 1   TRUEplus Lancets 33G MISC TEST  UP  TO FOUR TIMES DAILY AS DIRECTED 100 each 3   vitamin B-12 (CYANOCOBALAMIN) 1000 MCG tablet Take 500 mcg by mouth daily.     vitamin C (  ASCORBIC ACID) 500 MG tablet Take 500 mg by mouth daily.     vitamin E 400 UNIT capsule Take 200 Units by mouth daily.     No current facility-administered medications on file prior to visit.        ROS:  All others reviewed and negative.  Objective        PE:  BP 130/74 (BP Location: Left Arm, Patient Position: Sitting, Cuff Size: Large)   Pulse 82   Temp 98 F (36.7 C) (Oral)   Ht 4' 10"  (1.473 m)   Wt 151 lb (68.5 kg)   SpO2 97%   BMI 31.56 kg/m                 Constitutional: Pt appears in NAD               HENT: Head: NCAT.                Right Ear: External ear normal.                 Left Ear: External ear normal.                Eyes: . Pupils are equal, round, and reactive to light. Conjunctivae and EOM are normal               Nose: without d/c or deformity               Neck: Neck supple. Gross normal ROM               Cardiovascular: Normal rate and regular rhythm.                 Pulmonary/Chest: Effort normal and breath sounds without rales or wheezing.                Abd:  Soft, NT, ND, + BS, no organomegaly               Neurological: Pt is alert. At baseline orientation, motor grossly intact               Skin: Skin is warm. No rashes, no other new lesions, LE edema - none               Psychiatric: Pt behavior is normal without agitation   Micro: none  Cardiac tracings I have personally interpreted today:  none  Pertinent Radiological findings (summarize): none   Lab Results  Component Value Date   WBC 2.6 (L) 08/30/2021   HGB 7.2 (L) 08/30/2021   HCT 21.1 (L) 08/30/2021   PLT 56 (L) 08/30/2021   GLUCOSE 155 (H) 08/30/2021   CHOL 115 08/17/2020   TRIG 102.0 08/17/2020   HDL 49.80 08/17/2020   LDLDIRECT 138.0 02/05/2017   LDLCALC 44 08/17/2020   ALT 25 08/30/2021    AST 12 (L) 08/30/2021   NA 140 08/30/2021   K 2.9 (L) 08/30/2021   CL 101 08/30/2021   CREATININE 0.41 (L) 08/30/2021   BUN 12 08/30/2021   CO2 35 (H) 08/30/2021   TSH 1.05 08/17/2020   INR 1.1 08/29/2020   HGBA1C 5.4 08/17/2020   Assessment/Plan:  Dawn Gomez is a 86 y.o. White or Caucasian [1] female with  has a past medical history of Abnormal heart rhythm, Aortic aneurysm ([place]), Aortic atherosclerosis ([place]) (03/03/2019), Coronary artery calcification seen on CT scan (12/18/2020), Dyspnea, Hypothyroid, nscl ca (08/2020), OSA (obstructive sleep apnea), Psoriasis (02/05/2017), Pulmonary fibrosis (  [place]), SVT (supraventricular tachycardia) ([place]), and Thyroid disease.  Fall No apparent injury or complication off her home o2 overnight, declines referral PT, cont to follow  Vitamin D deficiency Last vitamin D Lab Results  Component Value Date   VD25OH 53.31 08/17/2020   Stable, cont oral replacement   Hypothyroidism Lab Results  Component Value Date   TSH 1.05 08/17/2020   Stable, pt to continue levothyroxine, for f/u lab   Hyperglycemia Lab Results  Component Value Date   HGBA1C 5.4 08/17/2020   Stable, pt to continue current medical treatment  - diet   Depression Stable overall, declines need today for change in tx or referral  Followup: Return in about 3 months (around 11/29/2021).  Cathlean Cower, MD 09/01/2021 5:48 PM [facility]

## 2021-08-29 NOTE — Patient Instructions (Signed)
Please continue all other medications as before, and refills have been done if requested.  Please have the pharmacy call with any other refills you may need.  Please continue your efforts at being more active, low cholesterol diet, and weight control.  Please keep your appointments with your specialists as you may have planned  Please make an Appointment to return in 3 months, or sooner if needed

## 2021-08-29 NOTE — Telephone Encounter (Signed)
Left a detailed message on the patient's voicemail about adding the head CT to her already scheduled scans on 6/5. I reviewed that she will need to be NPO and pick up some oral contrast from the radiology department ahead of time and that the check in time has been changed to 9:30am.   Mont Dutton R.T.(R)(T) Radiation Special Procedures Navigator

## 2021-08-29 NOTE — Telephone Encounter (Signed)
PT was seen today by Dr.John and was told to come back for a 3 month follow up. We did get that appointment set up for her in august but she also had a 3 m/o f/u set up for June. Were we wanting her to keep that appointment as well?

## 2021-08-29 NOTE — Telephone Encounter (Signed)
Patient called this am in reference to O2 tank. The patient O2 tank was low during a visit we swapped out the tanks and gave her one of ours for her tank that was low. Patient is wondering if we need that 02 tank back. If patient calls back, make her aware that since we swapped the tanks out there is not need to bring that one back to [place].

## 2021-08-30 ENCOUNTER — Inpatient Hospital Stay: Payer: Medicare HMO

## 2021-08-30 ENCOUNTER — Other Ambulatory Visit: Payer: Self-pay | Admitting: Internal Medicine

## 2021-08-30 ENCOUNTER — Other Ambulatory Visit: Payer: Self-pay

## 2021-08-30 ENCOUNTER — Other Ambulatory Visit: Payer: Self-pay | Admitting: *Deleted

## 2021-08-30 ENCOUNTER — Other Ambulatory Visit: Payer: Self-pay | Admitting: Hospice

## 2021-08-30 DIAGNOSIS — C3432 Malignant neoplasm of lower lobe, left bronchus or lung: Secondary | ICD-10-CM

## 2021-08-30 DIAGNOSIS — D6481 Anemia due to antineoplastic chemotherapy: Secondary | ICD-10-CM

## 2021-08-30 DIAGNOSIS — D649 Anemia, unspecified: Secondary | ICD-10-CM | POA: Diagnosis not present

## 2021-08-30 DIAGNOSIS — Z79899 Other long term (current) drug therapy: Secondary | ICD-10-CM | POA: Diagnosis not present

## 2021-08-30 DIAGNOSIS — Z95828 Presence of other vascular implants and grafts: Secondary | ICD-10-CM

## 2021-08-30 DIAGNOSIS — Z5111 Encounter for antineoplastic chemotherapy: Secondary | ICD-10-CM | POA: Diagnosis not present

## 2021-08-30 DIAGNOSIS — C787 Secondary malignant neoplasm of liver and intrahepatic bile duct: Secondary | ICD-10-CM | POA: Diagnosis not present

## 2021-08-30 LAB — CBC WITH DIFFERENTIAL (CANCER CENTER ONLY)
Abs Immature Granulocytes: 0.03 10*3/uL (ref 0.00–0.07)
Basophils Absolute: 0 10*3/uL (ref 0.0–0.1)
Basophils Relative: 0 %
Eosinophils Absolute: 0 10*3/uL (ref 0.0–0.5)
Eosinophils Relative: 0 %
HCT: 21.1 % — ABNORMAL LOW (ref 36.0–46.0)
Hemoglobin: 7.2 g/dL — ABNORMAL LOW (ref 12.0–15.0)
Immature Granulocytes: 1 %
Lymphocytes Relative: 8 %
Lymphs Abs: 0.2 10*3/uL — ABNORMAL LOW (ref 0.7–4.0)
MCH: 32.6 pg (ref 26.0–34.0)
MCHC: 34.1 g/dL (ref 30.0–36.0)
MCV: 95.5 fL (ref 80.0–100.0)
Monocytes Absolute: 0.1 10*3/uL (ref 0.1–1.0)
Monocytes Relative: 5 %
Neutro Abs: 2.2 10*3/uL (ref 1.7–7.7)
Neutrophils Relative %: 86 %
Platelet Count: 56 10*3/uL — ABNORMAL LOW (ref 150–400)
RBC: 2.21 MIL/uL — ABNORMAL LOW (ref 3.87–5.11)
RDW: 18.4 % — ABNORMAL HIGH (ref 11.5–15.5)
WBC Count: 2.6 10*3/uL — ABNORMAL LOW (ref 4.0–10.5)
nRBC: 0 % (ref 0.0–0.2)

## 2021-08-30 LAB — CMP (CANCER CENTER ONLY)
ALT: 25 U/L (ref 0–44)
AST: 12 U/L — ABNORMAL LOW (ref 15–41)
Albumin: 3.2 g/dL — ABNORMAL LOW (ref 3.5–5.0)
Alkaline Phosphatase: 47 U/L (ref 38–126)
Anion gap: 4 — ABNORMAL LOW (ref 5–15)
BUN: 12 mg/dL (ref 8–23)
CO2: 35 mmol/L — ABNORMAL HIGH (ref 22–32)
Calcium: 8.2 mg/dL — ABNORMAL LOW (ref 8.9–10.3)
Chloride: 101 mmol/L (ref 98–111)
Creatinine: 0.41 mg/dL — ABNORMAL LOW (ref 0.44–1.00)
GFR, Estimated: >60 mL/min (ref >60)
Glucose, Bld: 155 mg/dL — ABNORMAL HIGH (ref 70–99)
Potassium: 2.9 mmol/L — ABNORMAL LOW (ref 3.5–5.1)
Sodium: 140 mmol/L (ref 135–145)
Total Bilirubin: 0.9 mg/dL (ref 0.3–1.2)
Total Protein: 5.3 g/dL — ABNORMAL LOW (ref 6.5–8.1)

## 2021-08-30 LAB — SAMPLE TO BLOOD BANK

## 2021-08-30 LAB — PREPARE RBC (CROSSMATCH)

## 2021-08-30 MED ORDER — DIPHENHYDRAMINE HCL 25 MG PO CAPS
25.0000 mg | ORAL_CAPSULE | Freq: Once | ORAL | Status: AC
  Administered 2021-08-30: 25 mg via ORAL
  Filled 2021-08-30: qty 1

## 2021-08-30 MED ORDER — ACETAMINOPHEN 325 MG PO TABS
650.0000 mg | ORAL_TABLET | Freq: Once | ORAL | Status: AC
  Administered 2021-08-30: 650 mg via ORAL
  Filled 2021-08-30: qty 2

## 2021-08-30 MED ORDER — HEPARIN SOD (PORK) LOCK FLUSH 100 UNIT/ML IV SOLN
500.0000 [IU] | Freq: Once | INTRAVENOUS | Status: AC
  Administered 2021-08-30: 500 [IU]

## 2021-08-30 MED ORDER — SODIUM CHLORIDE 0.9% IV SOLUTION
250.0000 mL | Freq: Once | INTRAVENOUS | Status: AC
  Administered 2021-08-30: 250 mL via INTRAVENOUS

## 2021-08-30 MED ORDER — SODIUM CHLORIDE 0.9% FLUSH
10.0000 mL | INTRAVENOUS | Status: AC | PRN
  Administered 2021-08-30: 10 mL

## 2021-08-30 MED ORDER — HEPARIN SOD (PORK) LOCK FLUSH 100 UNIT/ML IV SOLN
250.0000 [IU] | INTRAVENOUS | Status: AC | PRN
  Administered 2021-08-30: 250 [IU]

## 2021-08-30 MED ORDER — SODIUM CHLORIDE 0.9% FLUSH
10.0000 mL | Freq: Once | INTRAVENOUS | Status: AC
  Administered 2021-08-30: 10 mL

## 2021-08-30 NOTE — Telephone Encounter (Signed)
Yes please

## 2021-08-30 NOTE — Patient Instructions (Signed)
Blood Transfusion, Adult, Care After This sheet gives you information about how to care for yourself after your procedure. Your doctor may also give you more specific instructions. If you have problems or questions, contact your doctor. What can I expect after the procedure? After the procedure, it is common to have: Bruising and soreness at the IV site. A headache. Follow these instructions at home: Insertion site care     Follow instructions from your doctor about how to take care of your insertion site. This is where an IV tube was put into your vein. Make sure you: Wash your hands with soap and water before and after you change your bandage (dressing). If you cannot use soap and water, use hand sanitizer. Change your bandage as told by your doctor. Check your insertion site every day for signs of infection. Check for: Redness, swelling, or pain. Bleeding from the site. Warmth. Pus or a bad smell. General instructions Take over-the-counter and prescription medicines only as told by your doctor. Rest as told by your doctor. Go back to your normal activities as told by your doctor. Keep all follow-up visits as told by your doctor. This is important. Contact a doctor if: You have itching or red, swollen areas of skin (hives). You feel worried or nervous (anxious). You feel weak after doing your normal activities. You have redness, swelling, warmth, or pain around the insertion site. You have blood coming from the insertion site, and the blood does not stop with pressure. You have pus or a bad smell coming from the insertion site. Get help right away if: You have signs of a serious reaction. This may be coming from an allergy or the body's defense system (immune system). Signs include: Trouble breathing or shortness of breath. Swelling of the face or feeling warm (flushed). Fever or chills. Head, chest, or back pain. Dark pee (urine) or blood in the pee. Widespread rash. Fast  heartbeat. Feeling dizzy or light-headed. You may receive your blood transfusion in an outpatient setting. If so, you will be told whom to contact to report any reactions. These symptoms may be an emergency. Do not wait to see if the symptoms will go away. Get medical help right away. Call your local emergency services (911 in the [place] Do not drive yourself to the hospital. Summary Bruising and soreness at the IV site are common. Check your insertion site every day for signs of infection. Rest as told by your doctor. Go back to your normal activities as told by your doctor. Get help right away if you have signs of a serious reaction. This information is not intended to replace advice given to you by your health care provider. Make sure you discuss any questions you have with your health care provider. Document Revised: 07/20/2020 Document Reviewed: 09/17/2018 Elsevier Patient Education  2023 [employer]  

## 2021-08-30 NOTE — Telephone Encounter (Signed)
I'm assuming the patient was seen in March and has a 60mo f/u. You saw her yesterday and she scheduled a 74mo visit for that appt. Would you like her to still keep the 60mo f/u in June?

## 2021-08-30 NOTE — Telephone Encounter (Signed)
Dr. Jenny Reichmann would like her to keep her f/u appt that is scheduled for June.

## 2021-08-30 NOTE — Progress Notes (Signed)
Verbal order from Dr. Julien Nordmann for 2 units PRBCs today. Premeds: Tylenol 650 mg PO; Benadryl 25 mg PO. Confirmed orders received w/Beth at [facility]

## 2021-08-31 LAB — TYPE AND SCREEN
ABO/RH(D): O NEG
Antibody Screen: NEGATIVE
Unit division: 0
Unit division: 0

## 2021-08-31 LAB — BPAM RBC
Blood Product Expiration Date: 202306192359
Blood Product Expiration Date: 202306222359
ISSUE DATE / TIME: 202305251240
ISSUE DATE / TIME: 202305251240
Unit Type and Rh: 9500
Unit Type and Rh: 9500

## 2021-09-01 ENCOUNTER — Encounter: Payer: Self-pay | Admitting: Internal Medicine

## 2021-09-01 NOTE — Assessment & Plan Note (Signed)
Last vitamin D Lab Results  Component Value Date   VD25OH 53.31 08/17/2020   Stable, cont oral replacement

## 2021-09-01 NOTE — Assessment & Plan Note (Signed)
Stable overall, declines need today for change in tx or referral

## 2021-09-01 NOTE — Assessment & Plan Note (Addendum)
No apparent injury or complication off her home o2 overnight, declines referral PT, cont to follow

## 2021-09-01 NOTE — Assessment & Plan Note (Signed)
Lab Results  Component Value Date   TSH 1.05 08/17/2020   Stable, pt to continue levothyroxine, for f/u lab

## 2021-09-01 NOTE — Assessment & Plan Note (Signed)
Lab Results  Component Value Date   HGBA1C 5.4 08/17/2020   Stable, pt to continue current medical treatment  - diet

## 2021-09-06 ENCOUNTER — Inpatient Hospital Stay: Payer: Medicare HMO | Attending: Internal Medicine

## 2021-09-06 ENCOUNTER — Other Ambulatory Visit: Payer: Medicare HMO

## 2021-09-06 ENCOUNTER — Telehealth: Payer: Self-pay | Admitting: Internal Medicine

## 2021-09-06 ENCOUNTER — Other Ambulatory Visit: Payer: Self-pay

## 2021-09-06 DIAGNOSIS — G936 Cerebral edema: Secondary | ICD-10-CM | POA: Diagnosis not present

## 2021-09-06 DIAGNOSIS — R0689 Other abnormalities of breathing: Secondary | ICD-10-CM | POA: Diagnosis not present

## 2021-09-06 DIAGNOSIS — I471 Supraventricular tachycardia: Secondary | ICD-10-CM | POA: Diagnosis present

## 2021-09-06 DIAGNOSIS — E8809 Other disorders of plasma-protein metabolism, not elsewhere classified: Secondary | ICD-10-CM | POA: Diagnosis present

## 2021-09-06 DIAGNOSIS — G9340 Encephalopathy, unspecified: Secondary | ICD-10-CM | POA: Diagnosis not present

## 2021-09-06 DIAGNOSIS — Z515 Encounter for palliative care: Secondary | ICD-10-CM | POA: Diagnosis not present

## 2021-09-06 DIAGNOSIS — T451X5A Adverse effect of antineoplastic and immunosuppressive drugs, initial encounter: Secondary | ICD-10-CM | POA: Diagnosis not present

## 2021-09-06 DIAGNOSIS — E876 Hypokalemia: Secondary | ICD-10-CM | POA: Diagnosis not present

## 2021-09-06 DIAGNOSIS — F3341 Major depressive disorder, recurrent, in partial remission: Secondary | ICD-10-CM | POA: Diagnosis not present

## 2021-09-06 DIAGNOSIS — I10 Essential (primary) hypertension: Secondary | ICD-10-CM | POA: Diagnosis not present

## 2021-09-06 DIAGNOSIS — E785 Hyperlipidemia, unspecified: Secondary | ICD-10-CM | POA: Diagnosis present

## 2021-09-06 DIAGNOSIS — J9811 Atelectasis: Secondary | ICD-10-CM | POA: Diagnosis not present

## 2021-09-06 DIAGNOSIS — F0393 Unspecified dementia, unspecified severity, with mood disturbance: Secondary | ICD-10-CM | POA: Diagnosis present

## 2021-09-06 DIAGNOSIS — C3490 Malignant neoplasm of unspecified part of unspecified bronchus or lung: Secondary | ICD-10-CM | POA: Diagnosis not present

## 2021-09-06 DIAGNOSIS — G4733 Obstructive sleep apnea (adult) (pediatric): Secondary | ICD-10-CM | POA: Diagnosis present

## 2021-09-06 DIAGNOSIS — C3432 Malignant neoplasm of lower lobe, left bronchus or lung: Secondary | ICD-10-CM

## 2021-09-06 DIAGNOSIS — R410 Disorientation, unspecified: Secondary | ICD-10-CM | POA: Diagnosis not present

## 2021-09-06 DIAGNOSIS — C7931 Secondary malignant neoplasm of brain: Secondary | ICD-10-CM | POA: Diagnosis not present

## 2021-09-06 DIAGNOSIS — Z20822 Contact with and (suspected) exposure to covid-19: Secondary | ICD-10-CM | POA: Diagnosis not present

## 2021-09-06 DIAGNOSIS — I959 Hypotension, unspecified: Secondary | ICD-10-CM | POA: Diagnosis not present

## 2021-09-06 DIAGNOSIS — R0902 Hypoxemia: Secondary | ICD-10-CM | POA: Diagnosis present

## 2021-09-06 DIAGNOSIS — G9389 Other specified disorders of brain: Secondary | ICD-10-CM | POA: Diagnosis not present

## 2021-09-06 DIAGNOSIS — Z66 Do not resuscitate: Secondary | ICD-10-CM | POA: Diagnosis not present

## 2021-09-06 DIAGNOSIS — E669 Obesity, unspecified: Secondary | ICD-10-CM | POA: Diagnosis present

## 2021-09-06 DIAGNOSIS — D649 Anemia, unspecified: Secondary | ICD-10-CM

## 2021-09-06 DIAGNOSIS — Z7189 Other specified counseling: Secondary | ICD-10-CM | POA: Diagnosis not present

## 2021-09-06 DIAGNOSIS — R220 Localized swelling, mass and lump, head: Secondary | ICD-10-CM | POA: Diagnosis not present

## 2021-09-06 DIAGNOSIS — I248 Other forms of acute ischemic heart disease: Secondary | ICD-10-CM | POA: Diagnosis not present

## 2021-09-06 DIAGNOSIS — Z95828 Presence of other vascular implants and grafts: Secondary | ICD-10-CM

## 2021-09-06 DIAGNOSIS — J80 Acute respiratory distress syndrome: Secondary | ICD-10-CM | POA: Diagnosis not present

## 2021-09-06 DIAGNOSIS — J9621 Acute and chronic respiratory failure with hypoxia: Secondary | ICD-10-CM | POA: Diagnosis not present

## 2021-09-06 DIAGNOSIS — J849 Interstitial pulmonary disease, unspecified: Secondary | ICD-10-CM | POA: Diagnosis not present

## 2021-09-06 DIAGNOSIS — E039 Hypothyroidism, unspecified: Secondary | ICD-10-CM | POA: Diagnosis present

## 2021-09-06 DIAGNOSIS — R0602 Shortness of breath: Secondary | ICD-10-CM | POA: Diagnosis not present

## 2021-09-06 DIAGNOSIS — M25561 Pain in right knee: Secondary | ICD-10-CM | POA: Diagnosis not present

## 2021-09-06 DIAGNOSIS — R296 Repeated falls: Secondary | ICD-10-CM | POA: Diagnosis not present

## 2021-09-06 DIAGNOSIS — A419 Sepsis, unspecified organism: Secondary | ICD-10-CM | POA: Diagnosis not present

## 2021-09-06 DIAGNOSIS — E782 Mixed hyperlipidemia: Secondary | ICD-10-CM | POA: Diagnosis not present

## 2021-09-06 DIAGNOSIS — R0603 Acute respiratory distress: Secondary | ICD-10-CM | POA: Diagnosis not present

## 2021-09-06 DIAGNOSIS — D849 Immunodeficiency, unspecified: Secondary | ICD-10-CM | POA: Diagnosis not present

## 2021-09-06 DIAGNOSIS — G9341 Metabolic encephalopathy: Secondary | ICD-10-CM | POA: Diagnosis not present

## 2021-09-06 DIAGNOSIS — E872 Acidosis, unspecified: Secondary | ICD-10-CM | POA: Diagnosis not present

## 2021-09-06 DIAGNOSIS — D6959 Other secondary thrombocytopenia: Secondary | ICD-10-CM | POA: Diagnosis present

## 2021-09-06 DIAGNOSIS — J90 Pleural effusion, not elsewhere classified: Secondary | ICD-10-CM | POA: Diagnosis not present

## 2021-09-06 DIAGNOSIS — L409 Psoriasis, unspecified: Secondary | ICD-10-CM | POA: Diagnosis present

## 2021-09-06 DIAGNOSIS — R230 Cyanosis: Secondary | ICD-10-CM | POA: Diagnosis not present

## 2021-09-06 DIAGNOSIS — R4182 Altered mental status, unspecified: Secondary | ICD-10-CM | POA: Diagnosis not present

## 2021-09-06 DIAGNOSIS — J9690 Respiratory failure, unspecified, unspecified whether with hypoxia or hypercapnia: Secondary | ICD-10-CM | POA: Diagnosis not present

## 2021-09-06 DIAGNOSIS — J439 Emphysema, unspecified: Secondary | ICD-10-CM | POA: Diagnosis not present

## 2021-09-06 DIAGNOSIS — D6481 Anemia due to antineoplastic chemotherapy: Secondary | ICD-10-CM | POA: Diagnosis not present

## 2021-09-06 DIAGNOSIS — C787 Secondary malignant neoplasm of liver and intrahepatic bile duct: Secondary | ICD-10-CM | POA: Diagnosis present

## 2021-09-06 DIAGNOSIS — J939 Pneumothorax, unspecified: Secondary | ICD-10-CM | POA: Diagnosis not present

## 2021-09-06 LAB — CBC WITH DIFFERENTIAL (CANCER CENTER ONLY)
Abs Immature Granulocytes: 0.12 10*3/uL — ABNORMAL HIGH (ref 0.00–0.07)
Basophils Absolute: 0 10*3/uL (ref 0.0–0.1)
Basophils Relative: 1 %
Eosinophils Absolute: 0 10*3/uL (ref 0.0–0.5)
Eosinophils Relative: 1 %
HCT: 25.4 % — ABNORMAL LOW (ref 36.0–46.0)
Hemoglobin: 8.3 g/dL — ABNORMAL LOW (ref 12.0–15.0)
Immature Granulocytes: 4 %
Lymphocytes Relative: 19 %
Lymphs Abs: 0.5 10*3/uL — ABNORMAL LOW (ref 0.7–4.0)
MCH: 29.7 pg (ref 26.0–34.0)
MCHC: 32.7 g/dL (ref 30.0–36.0)
MCV: 91 fL (ref 80.0–100.0)
Monocytes Absolute: 0.5 10*3/uL (ref 0.1–1.0)
Monocytes Relative: 17 %
Neutro Abs: 1.6 10*3/uL — ABNORMAL LOW (ref 1.7–7.7)
Neutrophils Relative %: 58 %
Platelet Count: 92 10*3/uL — ABNORMAL LOW (ref 150–400)
RBC: 2.79 MIL/uL — ABNORMAL LOW (ref 3.87–5.11)
RDW: 19.5 % — ABNORMAL HIGH (ref 11.5–15.5)
WBC Count: 2.8 10*3/uL — ABNORMAL LOW (ref 4.0–10.5)
nRBC: 0.7 % — ABNORMAL HIGH (ref 0.0–0.2)

## 2021-09-06 LAB — CMP (CANCER CENTER ONLY)
ALT: 29 U/L (ref 0–44)
AST: 19 U/L (ref 15–41)
Albumin: 3.3 g/dL — ABNORMAL LOW (ref 3.5–5.0)
Alkaline Phosphatase: 45 U/L (ref 38–126)
Anion gap: 4 — ABNORMAL LOW (ref 5–15)
BUN: 10 mg/dL (ref 8–23)
CO2: 36 mmol/L — ABNORMAL HIGH (ref 22–32)
Calcium: 9.4 mg/dL (ref 8.9–10.3)
Chloride: 98 mmol/L (ref 98–111)
Creatinine: 0.43 mg/dL — ABNORMAL LOW (ref 0.44–1.00)
GFR, Estimated: >60 mL/min (ref >60)
Glucose, Bld: 109 mg/dL — ABNORMAL HIGH (ref 70–99)
Potassium: 3.6 mmol/L (ref 3.5–5.1)
Sodium: 138 mmol/L (ref 135–145)
Total Bilirubin: 1.1 mg/dL (ref 0.3–1.2)
Total Protein: 5.7 g/dL — ABNORMAL LOW (ref 6.5–8.1)

## 2021-09-06 LAB — SAMPLE TO BLOOD BANK

## 2021-09-06 MED ORDER — SODIUM CHLORIDE 0.9% FLUSH
10.0000 mL | Freq: Once | INTRAVENOUS | Status: AC
  Administered 2021-09-06: 10 mL

## 2021-09-06 MED ORDER — HEPARIN SOD (PORK) LOCK FLUSH 100 UNIT/ML IV SOLN
500.0000 [IU] | Freq: Once | INTRAVENOUS | Status: AC
  Administered 2021-09-06: 500 [IU]

## 2021-09-06 NOTE — Telephone Encounter (Signed)
Scheduled per 05/17 los, patient has been called and notified.

## 2021-09-07 ENCOUNTER — Other Ambulatory Visit: Payer: Self-pay

## 2021-09-07 ENCOUNTER — Inpatient Hospital Stay (HOSPITAL_COMMUNITY)
Admission: EM | Admit: 2021-09-07 | Discharge: 2021-10-06 | DRG: 180 | Disposition: E | Payer: Medicare HMO | Attending: Internal Medicine | Admitting: Internal Medicine

## 2021-09-07 ENCOUNTER — Inpatient Hospital Stay (HOSPITAL_COMMUNITY): Payer: Medicare HMO

## 2021-09-07 ENCOUNTER — Emergency Department (HOSPITAL_COMMUNITY): Payer: Medicare HMO

## 2021-09-07 ENCOUNTER — Encounter (HOSPITAL_COMMUNITY): Payer: Self-pay

## 2021-09-07 DIAGNOSIS — C7931 Secondary malignant neoplasm of brain: Secondary | ICD-10-CM | POA: Diagnosis present

## 2021-09-07 DIAGNOSIS — G4733 Obstructive sleep apnea (adult) (pediatric): Secondary | ICD-10-CM | POA: Diagnosis present

## 2021-09-07 DIAGNOSIS — J9811 Atelectasis: Secondary | ICD-10-CM | POA: Diagnosis present

## 2021-09-07 DIAGNOSIS — F32A Depression, unspecified: Secondary | ICD-10-CM | POA: Diagnosis present

## 2021-09-07 DIAGNOSIS — E039 Hypothyroidism, unspecified: Secondary | ICD-10-CM | POA: Diagnosis present

## 2021-09-07 DIAGNOSIS — M25561 Pain in right knee: Secondary | ICD-10-CM | POA: Diagnosis not present

## 2021-09-07 DIAGNOSIS — I959 Hypotension, unspecified: Secondary | ICD-10-CM | POA: Diagnosis present

## 2021-09-07 DIAGNOSIS — R296 Repeated falls: Secondary | ICD-10-CM | POA: Diagnosis present

## 2021-09-07 DIAGNOSIS — E669 Obesity, unspecified: Secondary | ICD-10-CM | POA: Diagnosis present

## 2021-09-07 DIAGNOSIS — C3490 Malignant neoplasm of unspecified part of unspecified bronchus or lung: Secondary | ICD-10-CM | POA: Diagnosis not present

## 2021-09-07 DIAGNOSIS — R0902 Hypoxemia: Secondary | ICD-10-CM | POA: Diagnosis present

## 2021-09-07 DIAGNOSIS — J939 Pneumothorax, unspecified: Secondary | ICD-10-CM | POA: Diagnosis present

## 2021-09-07 DIAGNOSIS — I471 Supraventricular tachycardia: Secondary | ICD-10-CM | POA: Diagnosis present

## 2021-09-07 DIAGNOSIS — Z79899 Other long term (current) drug therapy: Secondary | ICD-10-CM

## 2021-09-07 DIAGNOSIS — F3341 Major depressive disorder, recurrent, in partial remission: Secondary | ICD-10-CM | POA: Diagnosis not present

## 2021-09-07 DIAGNOSIS — C787 Secondary malignant neoplasm of liver and intrahepatic bile duct: Secondary | ICD-10-CM | POA: Diagnosis present

## 2021-09-07 DIAGNOSIS — R220 Localized swelling, mass and lump, head: Secondary | ICD-10-CM | POA: Diagnosis not present

## 2021-09-07 DIAGNOSIS — Z888 Allergy status to other drugs, medicaments and biological substances status: Secondary | ICD-10-CM

## 2021-09-07 DIAGNOSIS — R410 Disorientation, unspecified: Secondary | ICD-10-CM | POA: Diagnosis not present

## 2021-09-07 DIAGNOSIS — E785 Hyperlipidemia, unspecified: Secondary | ICD-10-CM | POA: Diagnosis present

## 2021-09-07 DIAGNOSIS — R0603 Acute respiratory distress: Secondary | ICD-10-CM | POA: Diagnosis not present

## 2021-09-07 DIAGNOSIS — R230 Cyanosis: Secondary | ICD-10-CM | POA: Diagnosis not present

## 2021-09-07 DIAGNOSIS — R0689 Other abnormalities of breathing: Secondary | ICD-10-CM | POA: Diagnosis not present

## 2021-09-07 DIAGNOSIS — F0393 Unspecified dementia, unspecified severity, with mood disturbance: Secondary | ICD-10-CM | POA: Diagnosis present

## 2021-09-07 DIAGNOSIS — E782 Mixed hyperlipidemia: Secondary | ICD-10-CM | POA: Diagnosis not present

## 2021-09-07 DIAGNOSIS — Z66 Do not resuscitate: Secondary | ICD-10-CM | POA: Diagnosis present

## 2021-09-07 DIAGNOSIS — J90 Pleural effusion, not elsewhere classified: Secondary | ICD-10-CM | POA: Diagnosis not present

## 2021-09-07 DIAGNOSIS — J80 Acute respiratory distress syndrome: Secondary | ICD-10-CM | POA: Diagnosis not present

## 2021-09-07 DIAGNOSIS — I248 Other forms of acute ischemic heart disease: Secondary | ICD-10-CM | POA: Diagnosis present

## 2021-09-07 DIAGNOSIS — J439 Emphysema, unspecified: Secondary | ICD-10-CM | POA: Diagnosis present

## 2021-09-07 DIAGNOSIS — Z20822 Contact with and (suspected) exposure to covid-19: Secondary | ICD-10-CM | POA: Diagnosis present

## 2021-09-07 DIAGNOSIS — G9340 Encephalopathy, unspecified: Secondary | ICD-10-CM | POA: Diagnosis not present

## 2021-09-07 DIAGNOSIS — J9621 Acute and chronic respiratory failure with hypoxia: Secondary | ICD-10-CM | POA: Diagnosis present

## 2021-09-07 DIAGNOSIS — L409 Psoriasis, unspecified: Secondary | ICD-10-CM | POA: Diagnosis present

## 2021-09-07 DIAGNOSIS — J8410 Pulmonary fibrosis, unspecified: Secondary | ICD-10-CM | POA: Diagnosis present

## 2021-09-07 DIAGNOSIS — T451X5A Adverse effect of antineoplastic and immunosuppressive drugs, initial encounter: Secondary | ICD-10-CM | POA: Diagnosis present

## 2021-09-07 DIAGNOSIS — C3432 Malignant neoplasm of lower lobe, left bronchus or lung: Principal | ICD-10-CM | POA: Diagnosis present

## 2021-09-07 DIAGNOSIS — E876 Hypokalemia: Secondary | ICD-10-CM | POA: Diagnosis present

## 2021-09-07 DIAGNOSIS — Z885 Allergy status to narcotic agent status: Secondary | ICD-10-CM

## 2021-09-07 DIAGNOSIS — I10 Essential (primary) hypertension: Secondary | ICD-10-CM | POA: Diagnosis present

## 2021-09-07 DIAGNOSIS — D6481 Anemia due to antineoplastic chemotherapy: Secondary | ICD-10-CM | POA: Diagnosis present

## 2021-09-07 DIAGNOSIS — R8281 Pyuria: Secondary | ICD-10-CM | POA: Diagnosis present

## 2021-09-07 DIAGNOSIS — E872 Acidosis, unspecified: Secondary | ICD-10-CM | POA: Diagnosis present

## 2021-09-07 DIAGNOSIS — D6959 Other secondary thrombocytopenia: Secondary | ICD-10-CM | POA: Diagnosis present

## 2021-09-07 DIAGNOSIS — Z87891 Personal history of nicotine dependence: Secondary | ICD-10-CM

## 2021-09-07 DIAGNOSIS — Z515 Encounter for palliative care: Secondary | ICD-10-CM

## 2021-09-07 DIAGNOSIS — E8770 Fluid overload, unspecified: Secondary | ICD-10-CM | POA: Diagnosis present

## 2021-09-07 DIAGNOSIS — D849 Immunodeficiency, unspecified: Secondary | ICD-10-CM | POA: Diagnosis present

## 2021-09-07 DIAGNOSIS — E559 Vitamin D deficiency, unspecified: Secondary | ICD-10-CM | POA: Diagnosis present

## 2021-09-07 DIAGNOSIS — J849 Interstitial pulmonary disease, unspecified: Secondary | ICD-10-CM

## 2021-09-07 DIAGNOSIS — Z9981 Dependence on supplemental oxygen: Secondary | ICD-10-CM

## 2021-09-07 DIAGNOSIS — G936 Cerebral edema: Secondary | ICD-10-CM | POA: Diagnosis not present

## 2021-09-07 DIAGNOSIS — G9389 Other specified disorders of brain: Secondary | ICD-10-CM | POA: Diagnosis not present

## 2021-09-07 DIAGNOSIS — D649 Anemia, unspecified: Secondary | ICD-10-CM

## 2021-09-07 DIAGNOSIS — E8809 Other disorders of plasma-protein metabolism, not elsewhere classified: Secondary | ICD-10-CM | POA: Diagnosis present

## 2021-09-07 DIAGNOSIS — R0602 Shortness of breath: Secondary | ICD-10-CM | POA: Diagnosis not present

## 2021-09-07 DIAGNOSIS — R001 Bradycardia, unspecified: Secondary | ICD-10-CM | POA: Diagnosis present

## 2021-09-07 DIAGNOSIS — A419 Sepsis, unspecified organism: Secondary | ICD-10-CM | POA: Diagnosis not present

## 2021-09-07 DIAGNOSIS — Z9221 Personal history of antineoplastic chemotherapy: Secondary | ICD-10-CM

## 2021-09-07 DIAGNOSIS — Z7189 Other specified counseling: Secondary | ICD-10-CM | POA: Diagnosis not present

## 2021-09-07 DIAGNOSIS — J9690 Respiratory failure, unspecified, unspecified whether with hypoxia or hypercapnia: Secondary | ICD-10-CM | POA: Diagnosis not present

## 2021-09-07 DIAGNOSIS — Z8249 Family history of ischemic heart disease and other diseases of the circulatory system: Secondary | ICD-10-CM

## 2021-09-07 DIAGNOSIS — Z923 Personal history of irradiation: Secondary | ICD-10-CM

## 2021-09-07 DIAGNOSIS — G9341 Metabolic encephalopathy: Secondary | ICD-10-CM | POA: Diagnosis present

## 2021-09-07 DIAGNOSIS — Z801 Family history of malignant neoplasm of trachea, bronchus and lung: Secondary | ICD-10-CM

## 2021-09-07 DIAGNOSIS — Z6831 Body mass index (BMI) 31.0-31.9, adult: Secondary | ICD-10-CM

## 2021-09-07 DIAGNOSIS — G8929 Other chronic pain: Secondary | ICD-10-CM | POA: Diagnosis present

## 2021-09-07 DIAGNOSIS — Z825 Family history of asthma and other chronic lower respiratory diseases: Secondary | ICD-10-CM

## 2021-09-07 DIAGNOSIS — R4182 Altered mental status, unspecified: Secondary | ICD-10-CM | POA: Diagnosis not present

## 2021-09-07 LAB — COMPREHENSIVE METABOLIC PANEL
ALT: 29 U/L (ref 0–44)
AST: 36 U/L (ref 15–41)
Albumin: 3.1 g/dL — ABNORMAL LOW (ref 3.5–5.0)
Alkaline Phosphatase: 51 U/L (ref 38–126)
Anion gap: 11 (ref 5–15)
BUN: 11 mg/dL (ref 8–23)
CO2: 26 mmol/L (ref 22–32)
Calcium: 9.6 mg/dL (ref 8.9–10.3)
Chloride: 103 mmol/L (ref 98–111)
Creatinine, Ser: 0.68 mg/dL (ref 0.44–1.00)
GFR, Estimated: >60 mL/min (ref >60)
Glucose, Bld: 198 mg/dL — ABNORMAL HIGH (ref 70–99)
Potassium: 4.2 mmol/L (ref 3.5–5.1)
Sodium: 140 mmol/L (ref 135–145)
Total Bilirubin: 1.3 mg/dL — ABNORMAL HIGH (ref 0.3–1.2)
Total Protein: 6.4 g/dL — ABNORMAL LOW (ref 6.5–8.1)

## 2021-09-07 LAB — URINALYSIS, ROUTINE W REFLEX MICROSCOPIC
Bilirubin Urine: NEGATIVE
Glucose, UA: NEGATIVE mg/dL
Hgb urine dipstick: NEGATIVE
Ketones, ur: NEGATIVE mg/dL
Nitrite: NEGATIVE
Protein, ur: 100 mg/dL — AB
Specific Gravity, Urine: 1.016 (ref 1.005–1.030)
pH: 8 (ref 5.0–8.0)

## 2021-09-07 LAB — CBC WITH DIFFERENTIAL/PLATELET
Abs Immature Granulocytes: 0.43 10*3/uL — ABNORMAL HIGH (ref 0.00–0.07)
Basophils Absolute: 0 10*3/uL (ref 0.0–0.1)
Basophils Relative: 1 %
Eosinophils Absolute: 0 10*3/uL (ref 0.0–0.5)
Eosinophils Relative: 0 %
HCT: 29.3 % — ABNORMAL LOW (ref 36.0–46.0)
Hemoglobin: 9.4 g/dL — ABNORMAL LOW (ref 12.0–15.0)
Immature Granulocytes: 7 %
Lymphocytes Relative: 5 %
Lymphs Abs: 0.3 10*3/uL — ABNORMAL LOW (ref 0.7–4.0)
MCH: 30.2 pg (ref 26.0–34.0)
MCHC: 32.1 g/dL (ref 30.0–36.0)
MCV: 94.2 fL (ref 80.0–100.0)
Monocytes Absolute: 0.9 10*3/uL (ref 0.1–1.0)
Monocytes Relative: 14 %
Neutro Abs: 4.9 10*3/uL (ref 1.7–7.7)
Neutrophils Relative %: 73 %
Platelets: 155 10*3/uL (ref 150–400)
RBC: 3.11 MIL/uL — ABNORMAL LOW (ref 3.87–5.11)
RDW: 20.4 % — ABNORMAL HIGH (ref 11.5–15.5)
WBC: 6.6 10*3/uL (ref 4.0–10.5)
nRBC: 1.2 % — ABNORMAL HIGH (ref 0.0–0.2)

## 2021-09-07 LAB — LACTIC ACID, PLASMA
Lactic Acid, Venous: 4.9 mmol/L (ref 0.5–1.9)
Lactic Acid, Venous: 1.4 mmol/L (ref 0.5–1.9)

## 2021-09-07 LAB — PROTIME-INR
INR: 1.2 (ref 0.8–1.2)
Prothrombin Time: 15.4 seconds — ABNORMAL HIGH (ref 11.4–15.2)

## 2021-09-07 LAB — BRAIN NATRIURETIC PEPTIDE: B Natriuretic Peptide: 467.1 pg/mL — ABNORMAL HIGH (ref 0.0–100.0)

## 2021-09-07 LAB — TROPONIN I (HIGH SENSITIVITY)
Troponin I (High Sensitivity): 100 ng/L (ref <18)
Troponin I (High Sensitivity): 139 ng/L (ref <18)

## 2021-09-07 MED ORDER — SODIUM CHLORIDE 0.9 % IV SOLN
2.0000 g | Freq: Once | INTRAVENOUS | Status: AC
  Administered 2021-09-07: 2 g via INTRAVENOUS
  Filled 2021-09-07: qty 12.5

## 2021-09-07 MED ORDER — LACTATED RINGERS IV BOLUS (SEPSIS)
250.0000 mL | Freq: Once | INTRAVENOUS | Status: AC
  Administered 2021-09-08: 250 mL via INTRAVENOUS

## 2021-09-07 MED ORDER — LACTATED RINGERS IV SOLN: INTRAVENOUS | Status: DC

## 2021-09-07 MED ORDER — IOHEXOL 350 MG/ML SOLN
75.0000 mL | Freq: Once | INTRAVENOUS | Status: AC | PRN
  Administered 2021-09-08: 75 mL via INTRAVENOUS

## 2021-09-07 MED ORDER — LACTATED RINGERS IV BOLUS (SEPSIS)
1000.0000 mL | Freq: Once | INTRAVENOUS | Status: AC
Start: 2021-09-07 — End: 2021-09-07
  Administered 2021-09-07: 1000 mL via INTRAVENOUS

## 2021-09-07 MED ORDER — METRONIDAZOLE 500 MG/100ML IV SOLN
500.0000 mg | Freq: Once | INTRAVENOUS | Status: AC
  Administered 2021-09-07: 500 mg via INTRAVENOUS
  Filled 2021-09-07: qty 100

## 2021-09-07 MED ORDER — LORAZEPAM 2 MG/ML IJ SOLN
1.0000 mg | Freq: Once | INTRAMUSCULAR | Status: AC
  Administered 2021-09-07: 1 mg via INTRAVENOUS
  Filled 2021-09-07: qty 1

## 2021-09-07 MED ORDER — LACTATED RINGERS IV BOLUS (SEPSIS)
1000.0000 mL | Freq: Once | INTRAVENOUS | Status: AC
  Administered 2021-09-07: 1000 mL via INTRAVENOUS

## 2021-09-07 MED ORDER — VANCOMYCIN HCL IN DEXTROSE 1-5 GM/200ML-% IV SOLN
1000.0000 mg | Freq: Once | INTRAVENOUS | Status: AC
Start: 2021-09-07 — End: 2021-09-07
  Administered 2021-09-07: 1000 mg via INTRAVENOUS
  Filled 2021-09-07: qty 200

## 2021-09-07 NOTE — ED Triage Notes (Addendum)
Pt arrives via [facility] from home for respiratory distress. Pt lives with son, and EMS reports poor living conditions including no A/C, hoarding like state, son reporting he is unable to care for pt. He came home today and found pt on the ground having trouble breathing. Fire and EMS reports pt's initial O2 sat was in the 40's -50's on her home O2 of 4 lpm via [place]. Pt was gray in color and had blue lips per EMS. Pt placed on NRB 15 lpm and rebounded to 97%. Pt is poor historian due to hx of cancer and dementia. Able to state name during triage. Pt with numerous bruises in various healing stages. Unknown if pt is on blood thinners.

## 2021-09-07 NOTE — Sepsis Progress Note (Signed)
Elink following Code Sepsis. 

## 2021-09-07 NOTE — ED Notes (Signed)
Pt placed on 6 lpm via [place]. O2 saturation remains 93-94%.

## 2021-09-07 NOTE — ED Provider Notes (Signed)
[facility] DEPT Provider Note   CSN: [id number] Arrival date & time: 09/21/2021  1849     History  Chief Complaint  Patient presents with   Respiratory Distress    Dawn Gomez is a 86 y.o. female.  This is a 86 year old female presents from home due to increased shortness of breath.  Per nursing, EMS was called out due to respiratory distress.  When they arrived, patient had very poor living conditions.  Patient is O2 sats were between 40 and 50 on her home oxygen of 4 L.  Patient placed on nonrebreather 15 L of oxygen.  Did improve to 97%.  Patient has baseline history of cancer and dementia.  No further history obtainable due to her current state.  Patient arrived to the ED, patient placed on 6 L and O2 sat remained at 93 to 94%      Home Medications Prior to Admission medications   Medication Sig Start Date End Date Taking? Authorizing Provider  acetaminophen (TYLENOL) 500 MG tablet Take 500 mg by mouth every 6 (six) hours as needed.    [provider]  Ascorbic Acid (VITAMIN C PO) Take by mouth.    [provider]  Azelastine-Fluticasone 137-50 MCG/ACT SUSP Place 1 spray into the nose every 12 (twelve) hours. 08/17/20   Biagio Borg, MD  Blood Glucose Monitoring Suppl (TRUE METRIX METER) w/Device KIT USE AS DIRECTED 06/22/20   Biagio Borg, MD  CALCIUM-MAGNESIUM-ZINC PO Take 1 tablet by mouth daily.    [provider]  cholecalciferol (VITAMIN D) 1000 units tablet Take 5,000 Units by mouth daily.    [provider]  dexamethasone (DECADRON) 4 MG tablet Take 1 tablet (4 mg total) by mouth 2 (two) times daily with a meal. 06/20/21   Heilingoetter, Cassandra L, PA-C  diltiazem (CARDIZEM CD) 180 MG 24 hr capsule TAKE 1 CAPSULE EVERY DAY 03/06/21   Biagio Borg, MD  escitalopram (LEXAPRO) 20 MG tablet TAKE 1 TABLET AT BEDTIME 05/14/21   Biagio Borg, MD  fenofibrate 160 MG tablet TAKE 1 TABLET EVERY DAY 05/14/21    Biagio Borg, MD  fluocinonide (LIDEX) 0.05 % external solution APP EXT AA OF SCALP NIGHTLY 11/07/17   [provider]  furosemide (LASIX) 80 MG tablet Take 0.5 tablets (40 mg total) by mouth daily as needed. 06/18/21   Biagio Borg, MD  glucose blood (TRUE METRIX BLOOD GLUCOSE TEST) test strip TEST  UP  TO FOUR TIMES DAILY AS DIRECTED 04/23/21   Biagio Borg, MD  guaiFENesin (MUCINEX) 600 MG 12 hr tablet Take 2 tablets (1,200 mg total) by mouth 2 (two) times daily as needed. 02/08/20   Biagio Borg, MD  ipratropium-albuterol (DUONEB) 0.5-2.5 (3) MG/3ML SOLN Take 3 mLs by nebulization every 4 (four) hours as needed. 08/10/16   Domenic Polite, MD  levothyroxine (SYNTHROID) 100 MCG tablet TAKE 1 TABLET EVERY DAY BEFORE BREAKFAST 10/06/20   Biagio Borg, MD  lidocaine-prilocaine (EMLA) cream 30 Squeeze  1/2 tsp on a cotton ball and apply to skin over port a cath site. Do not rub it in. Cover with plastic wrap. Apply 1-2 hours prior to your treatment. 11/23/20   Curt Bears, MD  lisinopril (ZESTRIL) 5 MG tablet TAKE 1 TABLET EVERY DAY 01/24/21   Biagio Borg, MD  memantine (NAMENDA) 10 MG tablet Take 1 tablet (10 mg total) by mouth 2 (two) times daily. 06/21/21   Hayden Pedro,  PA-C  memantine (NAMENDA) 5 MG tablet Begin this prescription the first day of brain radiation. Week 1: take one tablet po qam. Week 2: take one tablet qam and qpm. Week 3: take two tablets qam, and one tablet po q pm. Week 4: take two tablets qam and qpm. Fill subsequent prescription q month. 06/21/21   Hayden Pedro, PA-C  potassium chloride SA (KLOR-CON M) 20 MEQ tablet TAKE 1 TABLET EVERY DAY WITH FOOD 04/23/21   Biagio Borg, MD  prochlorperazine (COMPAZINE) 10 MG tablet Take 1 tablet (10 mg total) by mouth every 6 (six) hours as needed for nausea or vomiting. 08/31/20   Curt Bears, MD  rosuvastatin (CRESTOR) 20 MG tablet TAKE 1 TABLET EVERY DAY 05/14/21   Biagio Borg, MD  tiZANidine (ZANAFLEX)  2 MG tablet TAKE 1 TABLET THREE TIMES DAILY 05/14/21   Biagio Borg, MD  traMADol (ULTRAM) 50 MG tablet Take 1 tablet (50 mg total) by mouth 4 (four) times daily. 07/23/21   Biagio Borg, MD  traMADol (ULTRAM) 50 MG tablet Take 1 tablet (50 mg total) by mouth every 6 (six) hours as needed. 08/08/21   Biagio Borg, MD  traZODone (DESYREL) 50 MG tablet TAKE 1 TABLET (50 MG TOTAL) BY MOUTH AT BEDTIME AS NEEDED FOR SLEEP. 01/24/21   Biagio Borg, MD  TRUEplus Lancets 33G MISC TEST  UP  TO FOUR TIMES DAILY AS DIRECTED 04/23/21   Biagio Borg, MD  vitamin B-12 (CYANOCOBALAMIN) 1000 MCG tablet Take 500 mcg by mouth daily.    [provider]  vitamin C (ASCORBIC ACID) 500 MG tablet Take 500 mg by mouth daily.    [provider]  vitamin E 400 UNIT capsule Take 200 Units by mouth daily.    [provider]      Allergies    Lipitor [atorvastatin], Codeine, Hydromorphone hcl, Levaquin [levofloxacin in d5w], Morphine, Oxycodone-acetaminophen, Propoxyphene n-acetaminophen, and Simvastatin    Review of Systems   Review of Systems  Unable to perform ROS: Dementia   Physical Exam Updated Vital Signs BP (!) 122/59 (BP Location: Right Arm)   Pulse 84   Temp 98.1 F (36.7 C) (Oral)   Resp (!) 24   SpO2 100%  Physical Exam Vitals and nursing note reviewed.  Constitutional:      General: She is not in acute distress.    Appearance: Normal appearance. She is well-developed. She is not toxic-appearing.  HENT:     Head: Normocephalic and atraumatic.  Eyes:     General: Lids are normal.     Conjunctiva/sclera: Conjunctivae normal.     Pupils: Pupils are equal, round, and reactive to light.  Neck:     Thyroid: No thyroid mass.     Trachea: No tracheal deviation.  Cardiovascular:     Rate and Rhythm: Normal rate and regular rhythm.     Heart sounds: Normal heart sounds. No murmur heard.   No gallop.  Pulmonary:     Effort: Pulmonary effort is normal. No respiratory  distress.     Breath sounds: No stridor. Examination of the right-lower field reveals decreased breath sounds and rhonchi. Examination of the left-lower field reveals decreased breath sounds and rhonchi. Decreased breath sounds and rhonchi present. No wheezing or rales.  Abdominal:     General: There is no distension.     Palpations: Abdomen is soft.     Tenderness: There is no abdominal tenderness. There is no rebound.  Musculoskeletal:        General: No tenderness. Normal range of motion.     Cervical back: Normal range of motion and neck supple.  Skin:    General: Skin is warm and dry.     Findings: No abrasion or rash.  Neurological:     Mental Status: She is alert. She is disoriented.     GCS: GCS eye subscore is 4. GCS verbal subscore is 4. GCS motor subscore is 5.     Cranial Nerves: No cranial nerve deficit.     Sensory: No sensory deficit.     Motor: No tremor.  Psychiatric:        Attention and Perception: She is inattentive.        Mood and Affect: Affect is flat.    ED Results / Procedures / Treatments   Labs (all labs ordered are listed, but only abnormal results are displayed) Labs Reviewed  COMPREHENSIVE METABOLIC PANEL - Abnormal; Notable for the following components:      Result Value   Glucose, Bld 198 (*)    Total Protein 6.4 (*)    Albumin 3.1 (*)    Total Bilirubin 1.3 (*)    All other components within normal limits  CBC WITH DIFFERENTIAL/PLATELET - Abnormal; Notable for the following components:   RBC 3.11 (*)    Hemoglobin 9.4 (*)    HCT 29.3 (*)    RDW 20.4 (*)    nRBC 1.2 (*)    Lymphs Abs 0.3 (*)    Abs Immature Granulocytes 0.43 (*)    All other components within normal limits  PROTIME-INR - Abnormal; Notable for the following components:   Prothrombin Time 15.4 (*)    All other components within normal limits  CULTURE, BLOOD (ROUTINE X 2)  CULTURE, BLOOD (ROUTINE X 2)  LACTIC ACID, PLASMA  LACTIC ACID, PLASMA  URINALYSIS, ROUTINE W  REFLEX MICROSCOPIC  BRAIN NATRIURETIC PEPTIDE  TROPONIN I (HIGH SENSITIVITY)    EKG EKG Interpretation  Date/Time:  Friday September 07 2021 18:58:34 EDT Ventricular Rate:  83 PR Interval:  133 QRS Duration: 91 QT Interval:  372 QTC Calculation: 438 R Axis:   -6 Text Interpretation: Sinus rhythm Nonspecific T abnormalities, anterior leads since last tracing no significant change Confirmed by Daleen Bo [phone]) on 10/01/2021 8:05:39 PM  Radiology DG Chest Port 1 View  Result Date: 10/05/2021 CLINICAL DATA:  Respiratory distress EXAM: PORTABLE CHEST 1 VIEW COMPARISON:  Chest CT 07/27/2021, radiograph 02/05/2017 FINDINGS: Right-sided central venous port tip over the cavoatrial region. Enlarged cardiomediastinal silhouette with mild vascular congestion. Underlying emphysema and chronic interstitial lung disease. Streaky bibasilar opacities may be due to atelectasis or scar. Known left lower lobe lung mass is better seen on CT. Aortic atherosclerosis IMPRESSION: 1. Cardiomegaly with suspected slight vascular congestion. 2. Underlying emphysema and chronic lung disease. Scarring or atelectasis at the bases. Known left lower lobe lung mass is better seen on CT Electronically Signed   By: Donavan Foil M.D.   On: 09/19/2021 19:36    Procedures Procedures    Medications Ordered in ED Medications - No data to display  ED Course/ Medical Decision Making/ A&P                           Medical Decision Making Amount and/or Complexity of Data Reviewed Labs: ordered. Radiology: ordered. ECG/medicine tests: ordered.  Risk Prescription drug management.   Patient is EKG per my interpretation  shows normal sinus rhythm with nonspecific changes.  Patient's chest x-ray per my interpretation shows some pulmonary vascular congestion.  BNP is elevated here troponin also elevated.  No evidence of acute ischemia noted on patient's EKG however.  Does have elevated lactate level at 4.9.  Code sepsis  initiated.  Patient started on broad-spectrum antibiotics.  Blood cultures were obtained.  Patient given gentle IV hydration due to her known history of CHF.  Spoke with patient's son who states that patient last chemotherapy about a week ago.  Patient is critically ill and requires admission to the hospital at this time.  Son is agreeable.  Will consult hospitalist for admission  CRITICAL CARE Performed by: Leota Jacobsen Total critical care time: 55 minutes Critical care time was exclusive of separately billable procedures and treating other patients. Critical care was necessary to treat or prevent imminent or life-threatening deterioration. Critical care was time spent personally by me on the following activities: development of treatment plan with patient and/or surrogate as well as nursing, discussions with consultants, evaluation of patient's response to treatment, examination of patient, obtaining history from patient or surrogate, ordering and performing treatments and interventions, ordering and review of laboratory studies, ordering and review of radiographic studies, pulse oximetry and re-evaluation of patient's condition.         Final Clinical Impression(s) / ED Diagnoses Final diagnoses:  None    Rx / DC Orders ED Discharge Orders     None         Lacretia Leigh, MD 09/12/2021 [id number]

## 2021-09-07 NOTE — ED Notes (Signed)
Lab called to add on troponin and BNP.

## 2021-09-07 NOTE — ED Notes (Signed)
Pt's SpO2 continues to drop below 90% on 6L nasal cannula. RT called. Pt placed on NRB at 15 L/min. Pt remains very restless and only oriented to person.

## 2021-09-07 NOTE — Progress Notes (Signed)
A consult was received from an ED physician for vancomycin and cefepime per pharmacy dosing.  The patient's profile has been reviewed for ht/wt/allergies/indication/available labs.    A one time order has been placed for vancomycin 1gm and cefepime 2gm .  Further antibiotics/pharmacy consults should be ordered by admitting physician if indicated.                       Thank you, Lynelle Doctor 09/06/2021  8:27 PM

## 2021-09-08 ENCOUNTER — Encounter (HOSPITAL_COMMUNITY): Payer: Self-pay | Admitting: Family Medicine

## 2021-09-08 ENCOUNTER — Other Ambulatory Visit: Payer: Self-pay

## 2021-09-08 ENCOUNTER — Inpatient Hospital Stay (HOSPITAL_COMMUNITY): Payer: Medicare HMO

## 2021-09-08 DIAGNOSIS — G9340 Encephalopathy, unspecified: Secondary | ICD-10-CM | POA: Diagnosis not present

## 2021-09-08 DIAGNOSIS — J9621 Acute and chronic respiratory failure with hypoxia: Secondary | ICD-10-CM

## 2021-09-08 DIAGNOSIS — I248 Other forms of acute ischemic heart disease: Secondary | ICD-10-CM | POA: Diagnosis present

## 2021-09-08 DIAGNOSIS — C7931 Secondary malignant neoplasm of brain: Secondary | ICD-10-CM | POA: Diagnosis not present

## 2021-09-08 DIAGNOSIS — J9811 Atelectasis: Secondary | ICD-10-CM | POA: Diagnosis not present

## 2021-09-08 DIAGNOSIS — C3432 Malignant neoplasm of lower lobe, left bronchus or lung: Secondary | ICD-10-CM | POA: Diagnosis not present

## 2021-09-08 DIAGNOSIS — R0603 Acute respiratory distress: Secondary | ICD-10-CM | POA: Diagnosis not present

## 2021-09-08 DIAGNOSIS — I10 Essential (primary) hypertension: Secondary | ICD-10-CM | POA: Diagnosis present

## 2021-09-08 DIAGNOSIS — G9389 Other specified disorders of brain: Secondary | ICD-10-CM | POA: Diagnosis not present

## 2021-09-08 DIAGNOSIS — Z515 Encounter for palliative care: Secondary | ICD-10-CM

## 2021-09-08 DIAGNOSIS — R8281 Pyuria: Secondary | ICD-10-CM | POA: Diagnosis present

## 2021-09-08 DIAGNOSIS — Z7189 Other specified counseling: Secondary | ICD-10-CM | POA: Diagnosis not present

## 2021-09-08 DIAGNOSIS — M25561 Pain in right knee: Secondary | ICD-10-CM | POA: Diagnosis not present

## 2021-09-08 DIAGNOSIS — J939 Pneumothorax, unspecified: Secondary | ICD-10-CM | POA: Diagnosis not present

## 2021-09-08 DIAGNOSIS — D6481 Anemia due to antineoplastic chemotherapy: Secondary | ICD-10-CM | POA: Diagnosis present

## 2021-09-08 DIAGNOSIS — E8770 Fluid overload, unspecified: Secondary | ICD-10-CM | POA: Diagnosis present

## 2021-09-08 DIAGNOSIS — J439 Emphysema, unspecified: Secondary | ICD-10-CM | POA: Diagnosis not present

## 2021-09-08 DIAGNOSIS — J90 Pleural effusion, not elsewhere classified: Secondary | ICD-10-CM | POA: Diagnosis not present

## 2021-09-08 DIAGNOSIS — R296 Repeated falls: Secondary | ICD-10-CM

## 2021-09-08 DIAGNOSIS — I2489 Other forms of acute ischemic heart disease: Secondary | ICD-10-CM | POA: Diagnosis present

## 2021-09-08 DIAGNOSIS — T451X5A Adverse effect of antineoplastic and immunosuppressive drugs, initial encounter: Secondary | ICD-10-CM | POA: Diagnosis not present

## 2021-09-08 DIAGNOSIS — E876 Hypokalemia: Secondary | ICD-10-CM | POA: Diagnosis not present

## 2021-09-08 DIAGNOSIS — G936 Cerebral edema: Secondary | ICD-10-CM | POA: Diagnosis not present

## 2021-09-08 DIAGNOSIS — R4182 Altered mental status, unspecified: Secondary | ICD-10-CM | POA: Diagnosis not present

## 2021-09-08 DIAGNOSIS — R220 Localized swelling, mass and lump, head: Secondary | ICD-10-CM | POA: Diagnosis not present

## 2021-09-08 DIAGNOSIS — E872 Acidosis, unspecified: Secondary | ICD-10-CM | POA: Diagnosis present

## 2021-09-08 DIAGNOSIS — E8809 Other disorders of plasma-protein metabolism, not elsewhere classified: Secondary | ICD-10-CM | POA: Diagnosis present

## 2021-09-08 LAB — BLOOD GAS, ARTERIAL
Acid-Base Excess: 9.2 mmol/L — ABNORMAL HIGH (ref 0.0–2.0)
Bicarbonate: 32.6 mmol/L — ABNORMAL HIGH (ref 20.0–28.0)
O2 Saturation: 90.5 %
Patient temperature: 33.6
pCO2 arterial: 34 mmHg (ref 32–48)
pH, Arterial: 7.58 — ABNORMAL HIGH (ref 7.35–7.45)
pO2, Arterial: 42 mmHg — ABNORMAL LOW (ref 83–108)

## 2021-09-08 LAB — COMPREHENSIVE METABOLIC PANEL
ALT: 28 U/L (ref 0–44)
AST: 35 U/L (ref 15–41)
Albumin: 2.5 g/dL — ABNORMAL LOW (ref 3.5–5.0)
Alkaline Phosphatase: 41 U/L (ref 38–126)
Anion gap: 6 (ref 5–15)
BUN: 8 mg/dL (ref 8–23)
CO2: 29 mmol/L (ref 22–32)
Calcium: 8.7 mg/dL — ABNORMAL LOW (ref 8.9–10.3)
Chloride: 102 mmol/L (ref 98–111)
Creatinine, Ser: 0.47 mg/dL (ref 0.44–1.00)
GFR, Estimated: >60 mL/min (ref >60)
Glucose, Bld: 105 mg/dL — ABNORMAL HIGH (ref 70–99)
Potassium: 3.2 mmol/L — ABNORMAL LOW (ref 3.5–5.1)
Sodium: 137 mmol/L (ref 135–145)
Total Bilirubin: 1 mg/dL (ref 0.3–1.2)
Total Protein: 4.9 g/dL — ABNORMAL LOW (ref 6.5–8.1)

## 2021-09-08 LAB — CBC WITH DIFFERENTIAL/PLATELET
Abs Immature Granulocytes: 0.21 10*3/uL — ABNORMAL HIGH (ref 0.00–0.07)
Basophils Absolute: 0 10*3/uL (ref 0.0–0.1)
Basophils Relative: 0 %
Eosinophils Absolute: 0 10*3/uL (ref 0.0–0.5)
Eosinophils Relative: 1 %
HCT: 22.7 % — ABNORMAL LOW (ref 36.0–46.0)
Hemoglobin: 7.4 g/dL — ABNORMAL LOW (ref 12.0–15.0)
Immature Granulocytes: 5 %
Lymphocytes Relative: 12 %
Lymphs Abs: 0.5 10*3/uL — ABNORMAL LOW (ref 0.7–4.0)
MCH: 30.7 pg (ref 26.0–34.0)
MCHC: 32.6 g/dL (ref 30.0–36.0)
MCV: 94.2 fL (ref 80.0–100.0)
Monocytes Absolute: 0.7 10*3/uL (ref 0.1–1.0)
Monocytes Relative: 15 %
Neutro Abs: 3 10*3/uL (ref 1.7–7.7)
Neutrophils Relative %: 67 %
Platelets: 140 10*3/uL — ABNORMAL LOW (ref 150–400)
RBC: 2.41 MIL/uL — ABNORMAL LOW (ref 3.87–5.11)
RDW: 20.1 % — ABNORMAL HIGH (ref 11.5–15.5)
WBC: 4.4 10*3/uL (ref 4.0–10.5)
nRBC: 0.7 % — ABNORMAL HIGH (ref 0.0–0.2)

## 2021-09-08 LAB — TSH: TSH: 1.693 u[IU]/mL (ref 0.350–4.500)

## 2021-09-08 LAB — SARS CORONAVIRUS 2 BY RT PCR: SARS Coronavirus 2 by RT PCR: NEGATIVE

## 2021-09-08 LAB — ECHOCARDIOGRAM COMPLETE
AR max vel: 1.52 cm2
AV Peak grad: 10.6 mmHg
Ao pk vel: 1.63 m/s
Area-P 1/2: 3.05 cm2
Calc EF: 61.3 %
Height: 58 in
S' Lateral: 2.8 cm
Single Plane A2C EF: 62.4 %
Single Plane A4C EF: 62 %
Weight: 2507.95 oz

## 2021-09-08 LAB — TROPONIN I (HIGH SENSITIVITY)
Troponin I (High Sensitivity): 107 ng/L (ref <18)
Troponin I (High Sensitivity): 107 ng/L (ref <18)

## 2021-09-08 LAB — MRSA NEXT GEN BY PCR, NASAL: MRSA by PCR Next Gen: NOT DETECTED

## 2021-09-08 MED ORDER — FENTANYL CITRATE (PF) 100 MCG/2ML IJ SOLN
25.0000 ug | Freq: Once | INTRAMUSCULAR | Status: AC
  Administered 2021-09-08: 25 ug via INTRAVENOUS
  Filled 2021-09-08: qty 2

## 2021-09-08 MED ORDER — ESCITALOPRAM OXALATE 20 MG PO TABS: 20.0000 mg | ORAL_TABLET | Freq: Every day | ORAL | Status: DC

## 2021-09-08 MED ORDER — IPRATROPIUM-ALBUTEROL 0.5-2.5 (3) MG/3ML IN SOLN
3.0000 mL | RESPIRATORY_TRACT | Status: DC | PRN
Start: 2021-09-08 — End: 2021-09-11

## 2021-09-08 MED ORDER — CHLORHEXIDINE GLUCONATE CLOTH 2 % EX PADS
6.0000 | MEDICATED_PAD | Freq: Every day | CUTANEOUS | Status: DC
  Administered 2021-09-08 – 2021-09-09 (×2): 6 via TOPICAL

## 2021-09-08 MED ORDER — FENTANYL CITRATE (PF) 100 MCG/2ML IJ SOLN
25.0000 ug | INTRAMUSCULAR | Status: DC | PRN
  Administered 2021-09-08 – 2021-09-10 (×16): 100 ug via INTRAVENOUS
  Filled 2021-09-08 (×16): qty 2

## 2021-09-08 MED ORDER — ENOXAPARIN SODIUM 40 MG/0.4ML IJ SOSY
40.0000 mg | PREFILLED_SYRINGE | INTRAMUSCULAR | Status: DC
  Administered 2021-09-08 – 2021-09-09 (×2): 40 mg via SUBCUTANEOUS
  Filled 2021-09-08 (×2): qty 0.4

## 2021-09-08 MED ORDER — ONDANSETRON HCL 4 MG PO TABS: 4.0000 mg | ORAL_TABLET | Freq: Four times a day (QID) | ORAL | Status: DC | PRN

## 2021-09-08 MED ORDER — ACETAMINOPHEN 650 MG RE SUPP: 650.0000 mg | Freq: Four times a day (QID) | RECTAL | Status: DC | PRN

## 2021-09-08 MED ORDER — ACETAMINOPHEN 325 MG PO TABS: 650.0000 mg | ORAL_TABLET | Freq: Four times a day (QID) | ORAL | Status: DC | PRN

## 2021-09-08 MED ORDER — SODIUM CHLORIDE 0.9 % IV SOLN
2.0000 g | Freq: Two times a day (BID) | INTRAVENOUS | Status: DC
  Administered 2021-09-08 – 2021-09-10 (×5): 2 g via INTRAVENOUS
  Filled 2021-09-08 (×5): qty 12.5

## 2021-09-08 MED ORDER — ORAL CARE MOUTH RINSE
15.0000 mL | Freq: Two times a day (BID) | OROMUCOSAL | Status: DC
  Administered 2021-09-08 – 2021-09-10 (×2): 15 mL via OROMUCOSAL

## 2021-09-08 MED ORDER — MIDAZOLAM HCL 2 MG/2ML IJ SOLN
1.0000 mg | INTRAMUSCULAR | Status: DC | PRN
  Administered 2021-09-09: 1 mg via INTRAVENOUS
  Administered 2021-09-10: 2 mg via INTRAVENOUS
  Administered 2021-09-10 (×2): 1 mg via INTRAVENOUS
  Administered 2021-09-10: 2 mg via INTRAVENOUS
  Administered 2021-09-10: 1 mg via INTRAVENOUS
  Administered 2021-09-10: 2 mg via INTRAVENOUS
  Filled 2021-09-08 (×8): qty 2

## 2021-09-08 MED ORDER — CHLORHEXIDINE GLUCONATE CLOTH 2 % EX PADS: 6.0000 | MEDICATED_PAD | Freq: Every day | CUTANEOUS | Status: DC

## 2021-09-08 MED ORDER — DEXMEDETOMIDINE HCL IN NACL 200 MCG/50ML IV SOLN
0.4000 ug/kg/h | INTRAVENOUS | Status: DC
  Administered 2021-09-08: 0.2 ug/kg/h via INTRAVENOUS
  Administered 2021-09-08 – 2021-09-09 (×3): 0.4 ug/kg/h via INTRAVENOUS
  Administered 2021-09-09: 0.3 ug/kg/h via INTRAVENOUS
  Administered 2021-09-10 (×2): 0.5 ug/kg/h via INTRAVENOUS
  Filled 2021-09-08 (×7): qty 50

## 2021-09-08 MED ORDER — VANCOMYCIN HCL 500 MG/100ML IV SOLN: 500.0000 mg | INTRAVENOUS | Status: DC

## 2021-09-08 MED ORDER — FUROSEMIDE 10 MG/ML IJ SOLN
20.0000 mg | Freq: Once | INTRAMUSCULAR | Status: AC
  Administered 2021-09-08: 20 mg via INTRAVENOUS
  Filled 2021-09-08: qty 2

## 2021-09-08 MED ORDER — ONDANSETRON HCL 4 MG/2ML IJ SOLN: 4.0000 mg | Freq: Four times a day (QID) | INTRAMUSCULAR | Status: DC | PRN

## 2021-09-08 MED ORDER — CHLORHEXIDINE GLUCONATE 0.12 % MT SOLN
15.0000 mL | Freq: Two times a day (BID) | OROMUCOSAL | Status: DC
  Administered 2021-09-08 – 2021-09-10 (×3): 15 mL via OROMUCOSAL
  Filled 2021-09-08 (×3): qty 15

## 2021-09-08 MED ORDER — TRAZODONE HCL 50 MG PO TABS: 50.0000 mg | ORAL_TABLET | Freq: Every evening | ORAL | Status: DC | PRN

## 2021-09-08 MED ORDER — SODIUM CHLORIDE 0.9% FLUSH
3.0000 mL | Freq: Two times a day (BID) | INTRAVENOUS | Status: DC
  Administered 2021-09-08 – 2021-09-10 (×7): 3 mL via INTRAVENOUS

## 2021-09-08 MED ORDER — POTASSIUM CHLORIDE 10 MEQ/100ML IV SOLN
10.0000 meq | INTRAVENOUS | Status: AC
  Administered 2021-09-08 (×3): 10 meq via INTRAVENOUS
  Filled 2021-09-08 (×3): qty 100

## 2021-09-08 MED ORDER — FUROSEMIDE 10 MG/ML IJ SOLN
10.0000 mg | Freq: Once | INTRAMUSCULAR | Status: AC
  Administered 2021-09-08: 10 mg via INTRAVENOUS
  Filled 2021-09-08: qty 2

## 2021-09-08 MED ORDER — HALOPERIDOL LACTATE 5 MG/ML IJ SOLN: 5.0000 mg | Freq: Four times a day (QID) | INTRAMUSCULAR | Status: DC | PRN

## 2021-09-08 MED ORDER — HALOPERIDOL LACTATE 5 MG/ML IJ SOLN
2.0000 mg | Freq: Four times a day (QID) | INTRAMUSCULAR | Status: DC | PRN
Start: 2021-09-08 — End: 2021-09-08
  Administered 2021-09-08: 2 mg via INTRAVENOUS
  Filled 2021-09-08: qty 1

## 2021-09-08 MED ORDER — DILTIAZEM HCL ER COATED BEADS 180 MG PO CP24
180.0000 mg | ORAL_CAPSULE | Freq: Every day | ORAL | Status: DC
  Filled 2021-09-08: qty 1

## 2021-09-08 NOTE — Progress Notes (Addendum)
PROGRESS NOTE   Dawn Gomez  PZW:[id number]    DOB: 1936/03/05    DOA: 10/04/2021  PCP: Biagio Borg, MD   I have briefly reviewed patients previous medical records in [facility].  Chief Complaint  Patient presents with   Respiratory Distress    Brief Narrative:  86 year old married female with PMH of extensive stage SCLC with initial presentation May 2022 with large LLL lung mass, right hilar and mediastinal adenopathy, satellite lung lesions, metastatic liver disease, completed first-line systemic chemotherapy, subsequent observation for 6 months, followed by disease progression with lung lesions, enlarging liver mets and brain mets, s/p whole brain radiation, now completed cycle #4 of second line systemic chemotherapy, other history including COPD, OSA on CPAP, chronic respiratory failure with hypoxia on 4 L/min home oxygen, HLD, hypothyroidism, depression, memory/cognitive impairment, falls at home, presented to ED via EMS on 10/01/2021 from home after being found down and in respiratory distress.  She was reported to have appeared gray and was hypoxic to 50% on home oxygen 4 L/min.  Admitted to stepdown for acute on chronic respiratory failure with hypoxia, multifactorial, lactic acidosis, acute encephalopathy.  CCM consulted.  Agree that overall very poor prognosis.  Palliative care consulted for goals of care but appears appropriate to transition to full comfort care.   Assessment & Plan:  Principal Problem:   Acute on chronic respiratory failure with hypoxia (HCC) Active Problems:   Interstitial lung disease (HCC)   OSA (obstructive sleep apnea)   Hyperlipidemia   SVT (supraventricular tachycardia) (HCC)   Depression   Hypothyroidism   Psoriasis   Emphysema lung (HCC)   Small cell carcinoma of lower lobe of left lung (HCC)   Malignant neoplasm metastatic to brain (HCC)   Anemia due to antineoplastic chemotherapy   Hypoalbuminemia   Pyuria   Lactic acidosis    Acute encephalopathy   Demand ischemia (HCC)   Volume overload   Pneumothorax   HTN (hypertension)   Frequent falls   Acute on chronic hypoxic respiratory failure:  Likely multifactorial due to COPD, emphysema with blebs, OSA, lung cancer, pulmonary fibrosis, small pneumothorax,?  Pneumonia.  Clinically does not appear volume overloaded but BNP elevated.  Currently on Ventimask at 7 L/min, FiO2 30%.  Sedated for confusion and agitation.  CCM input appreciated, discussed with Dr. Chase Caller.  BiPAP contraindicated due to PTX.  Chest tube if PTX worsens but high risk for permanent bronchopleural fistula given blebs.  Chest x-ray 6/3: Stable to slightly decreased tiny right apical PTX.  Dr. Chase Caller recommends terminal comfort care and I agree.  Consulted palliative care medicine.  Will attempt to reach out oncology to keep them in the loop.  MRSA PCR negative, discontinue vancomycin, continue cefepime.  Trial of IV Lasix 40 mg x 1.  CTA chest as below appreciated.  COVID-19 RT-PCR negative   Acute metabolic encephalopathy: Acute AMS complicating underlying chronic cognitive impairment.  Again likely multifactorial related to hypoxia, meds,?  Infection and lactic acidosis complicating underlying brain mets.  Overnight agitation, currently on low-dose Precedex drip and bilateral hand mittens.  CT head actually shows improvement in brain mets, responding to therapy.  Delirium precautions.  ABG without CO2 retention.  TSH normal.  Elevated troponin: Likely due to demand ischemia from acute respiratory failure with hypoxia and other critical illness noted above.  Decreasing trend.  No angina reported.  Will not be a candidate for any aggressive interventions.  Lactic acidosis: Initial lactate of 4.9 which has since  normalized.  Secondary to acute respiratory failure.  IV fluids appropriately discontinued.  Acute on chronic anemia: Hemoglobin dropped from 9.4 on admission to 7.4.  No overt bleeding  reported.  Suspect due to recent cancer chemotherapy and current hemodilution.  S/p 2 units PRBC on 5/25.  Follow CBC in a.m. and consider PRBC transfusion if hemoglobin drops to 7 or less if aggressive care is considered by family.  Thrombocytopenia: Possibly due to acute illness, antibiotics, infectious etiology.  Follow CBC in a.m.  Hypokalemia: Replace IV and follow.   SCLC metastatic to liver and brain:  Dx May 2022, followed by Dr. Earlie Server. 1st line chemo Jun-Aug 2022, whole brain irradiation by Dr. Lisbeth Renshaw March 2023. 2nd line chemotherapy started March 2023, ongoing and completed fourth cycle mid May.  Follows with Dr. Curt Bears. Palliative care consulted and will alert oncologist on-call.   OSA:  - Can use CPAP overnight   Frequent falls: Pt will need 24 hour supervision.  - PT/OT/TOC consult provided she is not transitioned to full comfort care and improves to be evaluated.    Depression:  -Hold home SSRI given AMS.   Hypothyroidism:  -TSH normal.   Hypoalbuminemia: Noted   HTN, SVT:  -Hold antihypertensives, unable to give p.o. due to AMS.  Unsafe.   HLD:  - Hold statin for now  Body mass index is 32.76 kg/m.  Nutritional Status        Pressure Ulcer:     DVT prophylaxis: enoxaparin (LOVENOX) injection 40 mg Start: 09/08/21 1000     Code Status: DNR:  Family Communication: Unable to reach son, left VM message. Disposition:  Status is: Inpatient Remains inpatient appropriate because: Critically ill     Consultants:   PCCM Palliative care medicine  Procedures:   Ventimask  Antimicrobials:   IV cefepime IV vancomycin discontinued.   Subjective:  Seen this morning.  Patient unable to provide any history due to AMS.  As per RN, overnight and confused agitated, placed on Precedex drip but became hypotensive and hence was reduced to 0.2.  Objective:   Vitals:   09/08/21 0852 09/08/21 0853 09/08/21 0854 09/08/21 1000  BP: (!) 155/54    (!) 159/54  Pulse: (!) 59 62 60 (!) 57  Resp: 18 (!) 26 19 (!) 22  Temp:      TempSrc:      SpO2: 90% 92% (!) 89% 93%  Weight:      Height:        General exam: Elderly female, small built and chronically ill looking lying slightly propped up in bed with mild tachypnea but no overt distress.  Has a fullface Ventimask at 7 L/min, FiO2 30%, saturating in the mid 90s.  Generalized muscle wasting, bitemporal wasting, diminished subcutaneous fat. Respiratory system: Globally diminished breath sounds/distant breath sounds, no overt wheezing, rhonchi or crackles. Cardiovascular system: S1 & S2 heard, RRR. No JVD, murmurs, rubs, gallops or clicks. No pedal edema.  Telemetry personally reviewed: SR, SB in the 15s. Gastrointestinal system: Abdomen is nondistended, soft and nontender. No organomegaly or masses felt. Normal bowel sounds heard. Central nervous system: Barely arousable to touch or call, briefly opens eyes, mumbles and drifts back to sleep actively. No focal neurological deficits. Extremities: Moving all extremities.  Bilateral hand restraints Skin: No rashes, lesions or ulcers Psychiatry: Judgement and insight impaired. Mood & affect cannot be assessed.     Data Reviewed:   I have personally reviewed following labs and imaging studies   CBC:  Recent Labs  Lab 09/06/21 1350 10/01/2021 1913 09/08/21 0430  WBC 2.8* 6.6 4.4  NEUTROABS 1.6* 4.9 3.0  HGB 8.3* 9.4* 7.4*  HCT 25.4* 29.3* 22.7*  MCV 91.0 94.2 94.2  PLT 92* 155 140*    Basic Metabolic Panel: Recent Labs  Lab 09/06/21 1350 09/18/2021 1913 09/08/21 0430  NA 138 140 137  K 3.6 4.2 3.2*  CL 98 103 102  CO2 36* 26 29  GLUCOSE 109* 198* 105*  BUN 10 11 8   CREATININE 0.43* 0.68 0.47  CALCIUM 9.4 9.6 8.7*    Liver Function Tests: Recent Labs  Lab 09/06/21 1350 09/16/2021 1913 09/08/21 0430  AST 19 36 35  ALT 29 29 28   ALKPHOS 45 51 41  BILITOT 1.1 1.3* 1.0  PROT 5.7* 6.4* 4.9*  ALBUMIN 3.3* 3.1* 2.5*     CBG: No results for input(s): GLUCAP in the last 168 hours.  Microbiology Studies:   Recent Results (from the past 240 hour(s))  Culture, blood (Routine x 2)     Status: None (Preliminary result)   Collection Time: 09/20/2021  7:11 PM   Specimen: BLOOD  Result Value Ref Range Status   Specimen Description   Final    BLOOD BLOOD LEFT FOREARM Performed at [facility] [address] [place], [place] [address]    Special Requests   Final    BOTTLES DRAWN AEROBIC AND ANAEROBIC Blood Culture adequate volume Performed at [facility] [address], [place], [place] [address]    Culture   Final    NO GROWTH < 12 HOURS Performed at [facility] [address] [place], [place] [address]    Report Status PENDING  Incomplete  Culture, blood (Routine x 2)     Status: None (Preliminary result)   Collection Time: 09/21/2021  8:50 PM   Specimen: BLOOD  Result Value Ref Range Status   Specimen Description   Final    BLOOD PORTA CATH Performed at [facility] [address] [place], [place] [address]    Special Requests   Final    BOTTLES DRAWN AEROBIC AND ANAEROBIC Blood Culture results may not be optimal due to an excessive volume of blood received in culture bottles Performed at [facility] [address], [place], [place] [address]    Culture   Final    NO GROWTH < 12 HOURS Performed at [facility] [address] [place], [place] [address]    Report Status PENDING  Incomplete  MRSA Next Gen by PCR, Nasal     Status: None   Collection Time: 09/08/21  1:12 AM   Specimen: Nasal Mucosa; Nasal Swab  Result Value Ref Range Status   MRSA by PCR Next Gen NOT DETECTED NOT DETECTED Final    Comment: (NOTE) The GeneXpert MRSA Assay (FDA approved for NASAL specimens only), is one component of a comprehensive MRSA colonization surveillance program. It is not intended to diagnose MRSA infection  nor to guide or monitor treatment for MRSA infections. Test performance is not FDA approved in patients less than 38 years old. Performed at [facility], [place] [address] [place],  [address]   SARS Coronavirus 2 by RT PCR (hospital order, performed in [facility] hospital lab) *cepheid single result test* Anterior Nasal Swab     Status: None   Collection Time: 09/08/21  4:30 AM   Specimen: Anterior Nasal Swab  Result Value Ref  Range Status   SARS Coronavirus 2 by RT PCR NEGATIVE NEGATIVE Final    Comment: (NOTE) SARS-CoV-2 target nucleic acids are NOT DETECTED.  The SARS-CoV-2 RNA is generally detectable in upper and lower respiratory specimens during the acute phase of infection. The lowest concentration of SARS-CoV-2 viral copies this assay can detect is 250 copies / mL. A negative result does not preclude SARS-CoV-2 infection and should not be used as the sole basis for treatment or other patient management decisions.  A negative result may occur with improper specimen collection / handling, submission of specimen other than nasopharyngeal swab, presence of viral mutation(s) within the areas targeted by this assay, and inadequate number of viral copies (<250 copies / mL). A negative result must be combined with clinical observations, patient history, and epidemiological information.  Fact Sheet for Patients:   https://www.patel.info/  Fact Sheet for Healthcare Providers: https://hall.com/  This test is not yet approved or  cleared by the [place] FDA and has been authorized for detection and/or diagnosis of SARS-CoV-2 by FDA under an Emergency Use Authorization (EUA).  This EUA will remain in effect (meaning this test can be used) for the duration of the COVID-19 declaration under Section 564(b)(1) of the Act, 21 U.S.C. section 360bbb-3(b)(1), unless the authorization is terminated or revoked  sooner.  Performed at [facility], [place] [address] [place], [place] [address]     Radiology Studies:  DG Knee 1-2 Views Right  Result Date: 09/08/2021 CLINICAL DATA:  Fall, right knee pain EXAM: RIGHT KNEE - 1-2 VIEW COMPARISON:  None Available. FINDINGS: No fracture or malalignment visualized. Degenerative osteoarthritis present with narrowing of the joint spaces and productive osteophyte formation. The bones appear demineralized. IMPRESSION: 1. No fracture identified. 2. Tricompartmental degenerative osteoarthritis. Electronically Signed   By: Jacqulynn Cadet M.D.   On: 09/08/2021 08:04   CT HEAD WO CONTRAST (5MM)  Result Date: 09/08/2021 CLINICAL DATA:  Initial evaluation for mental status change, unknown cause. EXAM: CT HEAD WITHOUT CONTRAST TECHNIQUE: Contiguous axial images were obtained from the base of the skull through the vertex without intravenous contrast. RADIATION DOSE REDUCTION: This exam was performed according to the departmental dose-optimization program which includes automated exposure control, adjustment of the mA and/or kV according to patient size and/or use of iterative reconstruction technique. COMPARISON:  CT from 06/17/2021. FINDINGS: Brain: Postoperative changes from prior left-sided craniectomy with cranioplasty. Postoperative encephalomalacia within the underlying left frontotemporal region, stable. Previously seen left parafalcine mass at the left parieto-occipital region is markedly decreased in size now measuring approximately 9 mm. Associated vasogenic edema has markedly improved from prior. Additional noted metastatic lesion involving the anterior right frontal lobe is now low longer clearly visible by CT. No significant residual edema within this region. No new mass lesion or vasogenic edema. No acute intracranial hemorrhage. No visible large vessel territory infarct. No extra-axial fluid collection. Ventricles normal size without hydrocephalus.  Vascular: No hyperdense vessel. Skull: Left pterional craniectomy with cranioplasty. Calvarium otherwise intact. No acute scalp soft tissue abnormality. Sinuses/Orbits: Globes orbital soft tissues demonstrate no acute finding. Scattered mucosal thickening noted within the sphenoid ethmoidal sinuses. No mastoid effusion. Other: None. IMPRESSION: 1. Interval decrease in size of left parafalcine mass, now measuring 9 mm, with markedly improved vasogenic edema. Additional right frontal lobe lesion now no longer clearly visualized. Findings consistent with interval response to therapy. No new lesions identified. 2. No other acute intracranial abnormality. 3. Sequelae of prior left pterional craniectomy with cranioplasty. Electronically Signed  By: Jeannine Boga M.D.   On: 09/08/2021 01:14   CT Angio Chest Pulmonary Embolism (PE) W or WO Contrast  Result Date: 09/08/2021 CLINICAL DATA:  Respiratory distress. EXAM: CT ANGIOGRAPHY CHEST WITH CONTRAST TECHNIQUE: Multidetector CT imaging of the chest was performed using the standard protocol during bolus administration of intravenous contrast. Multiplanar CT image reconstructions and MIPs were obtained to evaluate the vascular anatomy. RADIATION DOSE REDUCTION: This exam was performed according to the departmental dose-optimization program which includes automated exposure control, adjustment of the mA and/or kV according to patient size and/or use of iterative reconstruction technique. CONTRAST:  69mL OMNIPAQUE IOHEXOL 350 MG/ML SOLN COMPARISON:  July 27, 2021 FINDINGS: Cardiovascular: A right-sided venous catheter is in place. There is marked severity calcification of the thoracic aorta, without evidence of aortic aneurysm the subsegmental pulmonary arteries are limited in evaluation secondary to overlying areas of artifact and suboptimal opacification with intravenous contrast. No evidence of pulmonary embolism. Normal heart size. No pericardial effusion.  Mediastinum/Nodes: No enlarged mediastinal, hilar, or axillary lymph nodes. Thyroid gland, trachea, and esophagus demonstrate no significant findings. Lungs/Pleura: Diffuse interstitial thickening is seen with mild to moderate severity atelectatic changes noted within the bilateral upper lobes and posterior aspect of the bilateral lower lobes. A large, stable, approximately 9.7 cm x 4.0 cm right basilar bleb is seen. There is no evidence of a pleural effusion. A small anterior right-sided pneumothorax is present. This extends from the anterior aspect of the right apex to the right lung base where it measures approximately 2.9 cm in maximum AP measurement. This represents a new finding when compared to the prior exam. Upper Abdomen: No acute abnormality. Musculoskeletal: Multilevel degenerative changes are seen throughout the thoracic spine. Other: It should be noted that the study is limited secondary to patient motion. Review of the MIP images confirms the above findings. IMPRESSION: 1. Small anterior right-sided pneumothorax. 2. Diffuse interstitial thickening with mild to moderate severity bilateral upper lobe and bibasilar atelectasis. 3. Marked severity emphysematous lung disease with a large, stable right basilar bleb. Aortic Atherosclerosis (ICD10-I70.0). Electronically Signed   By: Virgina Norfolk M.D.   On: 09/08/2021 00:28   DG CHEST PORT 1 VIEW  Result Date: 09/08/2021 CLINICAL DATA:  Pneumothorax EXAM: PORTABLE CHEST 1 VIEW COMPARISON:  Chest x-ray and CT scan of the chest obtained yesterday FINDINGS: Stable to slightly decreased trace right apical pneumothorax. Right IJ power injectable port catheter remains in unchanged position with the tip overlying the cavoatrial junction. Advanced bilateral interstitial airspace opacities consistent with pulmonary fibrosis. Linear atelectasis in the right middle lobe. Stable cardiomegaly. Atherosclerotic calcifications present in the transverse aorta.  IMPRESSION: Stable to slightly decreased tiny right apical pneumothorax. Lower lung volumes with new right middle lobe atelectasis. Electronically Signed   By: Jacqulynn Cadet M.D.   On: 09/08/2021 08:03   DG Chest Port 1 View  Result Date: 09/12/2021 CLINICAL DATA:  Respiratory distress EXAM: PORTABLE CHEST 1 VIEW COMPARISON:  Chest CT 07/27/2021, radiograph 02/05/2017 FINDINGS: Right-sided central venous port tip over the cavoatrial region. Enlarged cardiomediastinal silhouette with mild vascular congestion. Underlying emphysema and chronic interstitial lung disease. Streaky bibasilar opacities may be due to atelectasis or scar. Known left lower lobe lung mass is better seen on CT. Aortic atherosclerosis IMPRESSION: 1. Cardiomegaly with suspected slight vascular congestion. 2. Underlying emphysema and chronic lung disease. Scarring or atelectasis at the bases. Known left lower lobe lung mass is better seen on CT Electronically Signed   By: Maudie Mercury  Francoise Ceo M.D.   On: 09/15/2021 19:36    Scheduled Meds:    chlorhexidine  15 mL Mouth Rinse BID   Chlorhexidine Gluconate Cloth  6 each Topical Daily   diltiazem  180 mg Oral Daily   enoxaparin (LOVENOX) injection  40 mg Subcutaneous Q24H   escitalopram  20 mg Oral QHS   mouth rinse  15 mL Mouth Rinse q12n4p   sodium chloride flush  3 mL Intravenous Q12H    Continuous Infusions:    ceFEPime (MAXIPIME) IV Stopped (09/08/21 0844)   dexmedetomidine (PRECEDEX) IV infusion 0.2 mcg/kg/hr (09/08/21 0857)   vancomycin       LOS: 1 day     Vernell Leep, MD,  FACP, [facility], [facility], [facility] (Care Management Physician Certified) [place]  To contact the attending provider between 7A-7P or the covering provider during after hours 7P-7A, please log into the web site [email or web address] and access using universal [place] password for that web site. If you do not have the password, please call the hospital  operator.  09/08/2021, 11:14 AM

## 2021-09-08 NOTE — Consult Note (Signed)
NAME:  Dawn Gomez, MRN:  [id number], DOB:  12-24-1935, LOS: 1 ADMISSION DATE:  09/14/2021, CONSULTATION DATE:  09/08/20 REFERRING MD:  Bonner Puna, CHIEF COMPLAINT:  shortness of breath   History of Present Illness:  86 yo woman found on ground with difficulty breathing.  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 Normally on home o2 4L.   Initial sat 40-50s, improved on NRB 15 L to 97%.    Was initially doing ok on Nasal canula in ED, then at around 11pm, started needing NRB.   Initially elevated lactic acid.    Very confused on arrival to ICU.  Noted pyuria from hosp [facility].   Started on cefepime flagyl vanc   Trop elevated BNP mildly elevated.  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   Had P mentions pulmonary Fibrosis?    Meds: decadron 66m BID (06/20/21) Dilt 1843mdaily, lexapro 2031mHS, fenofibrate 160m70mily,  Lasix 40mg58m , guaifenesin 1200BID prn Duonebs prn, synthroid 100mcg54mly, lisinopril 5mg da68m namenda 10mg BI47mcrestor 20 mg daily, tizanidine 2mg TID,62mamadol 50 mg QID  Interim History / Subjective:    Objective   Blood pressure (!) 164/87, pulse 83, temperature 99.4 F (37.4 C), temperature source Axillary, resp. rate (!) 38, height 4' 10"  (1.473 m), weight 71.1 kg, SpO2 99 %.    FiO2 (%):  [50 %] 50 %  No intake or output data in the 24 hours ending 09/08/21 0139 Filed Weights   09/08/21 0130  Weight: 71.1 kg     Examination: General: Agitated, not oriented  HENT: NCAT, facial edema  Lungs: CTAB, symmetric breath sounds anteriorly  Cardiovascular: RRR no mgr  Abdomen: no tenderness or distention  Extremities: mild edema    Neuro: Altered  GU:   Resolved Hospital Problem list     Assessment & Plan:  Hypoxemia: CT chest shows small PTX, which I dont think is underlying cause of hypoxemia. Likely some volume overload, atelectasis, underlying hypoxmemia on 4 L O2 at home. Check Covid test.  Low dose diuresis.  AMS: Mets actually improved on CT head.  + ua, pyelo - on antibtiotics.  Starting precedex for agitation.    Labs   CBC: Recent Labs  Lab 09/06/21 1350 09/24/2021 1913  WBC 2.8* 6.6  NEUTROABS 1.6* 4.9  HGB 8.3* 9.4*  HCT 25.4* 29.3*  MCV 91.0 94.2  PLT 92* 155    [id number] Metabolic Panel: Recent Labs  Lab 09/06/21 1350 09/18/2021 1913  NA 138 140  K 3.6 4.2  CL 98 103  CO2 36* 26  GLUCOSE 109* 198*  BUN 10 11  CREATININE 0.43* 0.68  CALCIUM 9.4 9.6   GFR: Estimated Creatinine Clearance: 44.6 mL/min (by C-G formula based on SCr of 0.68 mg/dL). Recent Labs  Lab 09/06/21 1350 10/05/2021 1913 09/26/2021 2241  WBC 2.8* 6.6  --   LATICACIDVEN  --  4.9* 1.4    Liver Function Tests: Recent Labs  Lab 09/06/21  1350 09/16/2021 1913  AST 19 36  ALT 29 29  ALKPHOS 45 51  BILITOT 1.1 1.3*  PROT 5.7* 6.4*  ALBUMIN 3.3* 3.1*   No results for input(s): LIPASE, AMYLASE in the last 168 hours. No results for input(s): AMMONIA in the last 168 hours.  ABG    Component Value Date/Time   HCO3 29.8 (H) 08/05/2016 2237   TCO2 31 08/05/2016 2237   O2SAT 22.0 08/05/2016 2237     Coagulation Profile: Recent Labs  Lab 09/20/2021 1913  INR 1.2    Cardiac Enzymes: No results for input(s): CKTOTAL, CKMB, CKMBINDEX, TROPONINI in the last 168 hours.  HbA1C: Hgb A1c MFr Bld  Date/Time Value Ref Range Status  08/17/2020 02:37 PM 5.4 4.6 - 6.5 % Final    Comment:     Glycemic Control Guidelines for People with Diabetes:Non Diabetic:  <6%Goal of Therapy: <7%Additional Action Suggested:  >8%   12/17/2019 03:20 PM 5.2 <5.7 % of total Hgb Final    Comment:    For the purpose of screening for the presence of diabetes: . <5.7%       Consistent with the absence of diabetes 5.7-6.4%    Consistent with increased risk for diabetes             (prediabetes) > or =6.5%  Consistent with diabetes . This assay result is consistent with a decreased risk of diabetes. . Currently, no consensus exists regarding use of hemoglobin A1c for diagnosis of diabetes in children. . According to American Diabetes Association (ADA) guidelines, hemoglobin A1c <7.0% represents optimal control in non-pregnant diabetic patients. Different metrics may apply to specific patient populations.  Standards of Medical Care in Diabetes(ADA). .     CBG: No results for input(s): GLUCAP in the last 168 hours.  Review of Systems:     Past Medical History:  She,  has a past medical history of Abnormal heart rhythm, Aortic aneurysm ([place]), Aortic atherosclerosis ([place]) (03/03/2019), Coronary artery calcification seen on CT scan (12/18/2020), Dyspnea, Hypothyroid, nscl ca (08/2020), OSA (obstructive sleep apnea), Psoriasis (02/05/2017), Pulmonary fibrosis ([place]), SVT (supraventricular tachycardia) ([place]), and Thyroid disease.   Surgical History:   Past Surgical History:  Procedure Laterality Date   ABDOMINAL HYSTERECTOMY     APPENDECTOMY     CHOLECYSTECTOMY     CRANIOTOMY     for aneurysms   CRANIOTOMY  1980 x 2   aneurysmal clipping   HERNIA REPAIR     IR IMAGING GUIDED PORT INSERTION  09/15/2020   TOTAL ABDOMINAL HYSTERECTOMY     TUBAL LIGATION       Social History:   reports that she quit smoking about 15 years ago. Her smoking use included cigarettes. She has a 100.00 pack-year smoking history. She has never used smokeless tobacco. She reports current alcohol use. She reports  that she does not use drugs.   Family History:  Her family history includes Allergies in her sister; Bone cancer in her father; Breast cancer in her sister; Cancer in her sister; Clotting disorder in her father and sister; Emphysema in her father, mother, and sister; Heart disease in her father and mother; Lung cancer in her father and mother; Prostate cancer in her father; Rheum arthritis in her father, mother, and sister.   Allergies Allergies  Allergen Reactions   Lipitor [Atorvastatin] Shortness Of Breath, Diarrhea and Other (See Comments)    Dizzy, pain all over.   Codeine    Hydromorphone Hcl     REACTION: dementia  Levaquin [Levofloxacin In D5w]     Shock per pt but can take cipro   Morphine    Oxycodone-Acetaminophen    Propoxyphene N-Acetaminophen    Simvastatin      Home Medications  Prior to Admission medications   Medication Sig Start Date End Date Taking? Authorizing Provider  CALCIUM-MAGNESIUM-ZINC PO Take 1 tablet by mouth daily.   Yes [provider]  diltiazem (CARDIZEM CD) 180 MG 24 hr capsule TAKE 1 CAPSULE EVERY DAY Patient taking differently: Take 180 mg by mouth daily. 03/06/21  Yes Biagio Borg, MD  escitalopram (LEXAPRO) 20 MG tablet TAKE 1 TABLET AT BEDTIME Patient taking differently: Take 20 mg by mouth at bedtime. 05/14/21  Yes Biagio Borg, MD  fenofibrate 160 MG tablet TAKE 1 TABLET EVERY DAY Patient taking differently: Take 160 mg by mouth daily. 05/14/21  Yes Biagio Borg, MD  furosemide (LASIX) 80 MG tablet Take 0.5 tablets (40 mg total) by mouth daily as needed. Patient taking differently: Take 40 mg by mouth daily as needed for fluid. 06/18/21  Yes Biagio Borg, MD  guaiFENesin (MUCINEX) 600 MG 12 hr tablet Take 2 tablets (1,200 mg total) by mouth 2 (two) times daily as needed. Patient taking differently: Take 1,200 mg by mouth 2 (two) times daily as needed for cough. 02/08/20  Yes Biagio Borg, MD  ipratropium-albuterol (DUONEB) 0.5-2.5  (3) MG/3ML SOLN Take 3 mLs by nebulization every 4 (four) hours as needed. 08/10/16  Yes Domenic Polite, MD  lisinopril (ZESTRIL) 5 MG tablet TAKE 1 TABLET EVERY DAY Patient taking differently: Take 5 mg by mouth daily. 01/24/21  Yes Biagio Borg, MD  potassium chloride SA (KLOR-CON M) 20 MEQ tablet TAKE 1 TABLET EVERY DAY WITH FOOD Patient taking differently: Take 20 mEq by mouth daily. 04/23/21  Yes Biagio Borg, MD  rosuvastatin (CRESTOR) 20 MG tablet TAKE 1 TABLET EVERY DAY Patient taking differently: Take 20 mg by mouth daily. 05/14/21  Yes Biagio Borg, MD  tiZANidine (ZANAFLEX) 2 MG tablet TAKE 1 TABLET THREE TIMES DAILY Patient taking differently: Take 2 mg by mouth 3 (three) times daily. 05/14/21  Yes Biagio Borg, MD  traMADol (ULTRAM) 50 MG tablet Take 1 tablet (50 mg total) by mouth every 6 (six) hours as needed. Patient taking differently: Take 50 mg by mouth every 6 (six) hours as needed for moderate pain. 08/08/21  Yes Biagio Borg, MD  traZODone (DESYREL) 50 MG tablet TAKE 1 TABLET (50 MG TOTAL) BY MOUTH AT BEDTIME AS NEEDED FOR SLEEP. 01/24/21  Yes Biagio Borg, MD  vitamin B-12 (CYANOCOBALAMIN) 1000 MCG tablet Take 500 mcg by mouth daily.   Yes [provider]  vitamin C (ASCORBIC ACID) 500 MG tablet Take 500 mg by mouth daily.   Yes [provider]  vitamin E 400 UNIT capsule Take 200 Units by mouth daily.   Yes [provider]  Azelastine-Fluticasone 137-50 MCG/ACT SUSP Place 1 spray into the nose every 12 (twelve) hours. Patient not taking: Reported on 09/30/2021 08/17/20   Biagio Borg, MD  Blood Glucose Monitoring Suppl (TRUE METRIX METER) w/Device KIT USE AS DIRECTED 06/22/20   Biagio Borg, MD  dexamethasone (DECADRON) 4 MG tablet Take 1 tablet (4 mg total) by mouth 2 (two) times daily with a meal. Patient not taking: Reported on 09/22/2021 06/20/21   Heilingoetter, Cassandra L, PA-C  glucose blood (TRUE METRIX BLOOD GLUCOSE TEST) test strip TEST  UP  TO FOUR TIMES DAILY AS DIRECTED 04/23/21   Biagio Borg, MD  levothyroxine (SYNTHROID) 100 MCG tablet TAKE 1 TABLET EVERY DAY BEFORE BREAKFAST Patient not taking: Reported on 09/19/2021 10/06/20   Biagio Borg, MD  lidocaine-prilocaine (EMLA) cream 30 Squeeze  1/2 tsp on a cotton ball and apply to skin over port a cath site. Do not rub it in. Cover with plastic wrap. Apply 1-2 hours prior to your treatment. Patient not taking: Reported on 09/24/2021 11/23/20   Curt Bears, MD  memantine (NAMENDA) 10 MG tablet Take 1 tablet (10 mg total) by mouth 2 (two) times daily. Patient not taking: Reported on 09/21/2021 06/21/21   Hayden Pedro, PA-C  memantine [facility]) 5 MG tablet Begin this prescription the first day of brain radiation. Week 1: take one tablet po qam. Week 2: take one tablet qam and qpm. Week 3: take two tablets qam, and one tablet po q pm. Week 4: take two tablets qam and qpm. Fill subsequent prescription q month. Patient not taking: Reported on 09/17/2021 06/21/21   Hayden Pedro, PA-C  prochlorperazine (COMPAZINE) 10 MG tablet Take 1 tablet (10 mg total) by mouth every 6 (six) hours as needed for nausea or vomiting. Patient not taking: Reported on 09/26/2021 08/31/20   Curt Bears, MD  TRUEplus Lancets 33G MISC TEST  UP  TO FOUR TIMES DAILY AS DIRECTED 04/23/21   Biagio Borg, MD     Critical care time: 35

## 2021-09-08 NOTE — H&P (Signed)
History and Physical    Patient: Dawn Gomez TKP:[id number] DOB: 1935-05-02 DOA: 09/12/2021 DOS: the patient was seen and examined on 09/14/2021 PCP: Biagio Borg, MD  Patient coming from: Home  Chief Complaint:  Chief Complaint  Patient presents with   Respiratory Distress   HPI: Dawn Gomez is a 86 y.o. 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   She lives with her son who works, seems to have struggled with hoarding behaviors, and has at times fallen and is unable to get up or reach her home oxygen. History limited by her cognitive impairment (acutely and chronically). She was reported to have appeared gray and was hypoxic to 50%'s on home 4L O2, improved on 15L NRB.   In the ED temperature was 98.71F, RR 24/min, 100% on 15L NRB > 89% on 6L O2 by [place]. HR 80's (NSR on ECG), normotensive. Lactic acid elevated to 4.9 with normal bicarbonate and anion gap. WBC 6.6k. BNP 467.1 and troponin elevated 100 > 136. Broad IV antibiotics and 30cc/kg IVF given and hospitalists called for admission.  Review of Systems: As mentioned in the history of present illness. All other systems reviewed and are negative. Past Medical History:  Diagnosis Date   Abnormal heart rhythm    Aortic aneurysm ([facility])    Aortic atherosclerosis ([place]) 03/03/2019   Coronary artery calcification seen on CT scan 12/18/2020   Dyspnea    Hypothyroid    nscl ca 08/2020   OSA (obstructive sleep apnea)    on cpap   Psoriasis 02/05/2017   Pulmonary fibrosis ([facility])    SVT (supraventricular tachycardia) ([facility])    Thyroid disease    hypo   Past Surgical History:  Procedure Laterality Date   ABDOMINAL HYSTERECTOMY     APPENDECTOMY      CHOLECYSTECTOMY     CRANIOTOMY     for aneurysms   CRANIOTOMY  1980 x 2   aneurysmal clipping   HERNIA REPAIR     IR IMAGING GUIDED PORT INSERTION  09/15/2020   TOTAL ABDOMINAL HYSTERECTOMY     TUBAL LIGATION     Social History:  reports that she quit smoking about 15 years ago. Her smoking use included cigarettes. She has a 100.00 pack-year smoking history. She has never used smokeless tobacco. She reports current alcohol use. She reports that she does not use drugs.  Allergies  Allergen Reactions   Lipitor [Atorvastatin] Shortness Of Breath, Diarrhea and Other (See Comments)    Dizzy, pain all over.   Codeine    Hydromorphone Hcl     REACTION: dementia   Levaquin [Levofloxacin In D5w]     Shock per pt but can take cipro   Morphine    Oxycodone-Acetaminophen    Propoxyphene N-Acetaminophen    Simvastatin     Family History  Problem Relation Age of Onset   Emphysema Father    Heart disease Father    Clotting disorder Father    Rheum arthritis Father    Lung cancer Father    Prostate cancer Father    Bone cancer Father    Emphysema Sister    Emphysema Mother    Heart disease Mother    Rheum arthritis Mother  Lung cancer Mother    Allergies Sister    Clotting disorder Sister    Rheum arthritis Sister    Breast cancer Sister    Cancer Sister        ureter    Prior to Admission medications   Medication Sig  CALCIUM-MAGNESIUM-ZINC PO Take 1 tablet by mouth daily.  diltiazem (CARDIZEM CD) 180 MG 24 hr capsule TAKE 1 CAPSULE EVERY DAY Patient taking differently: Take 180 mg by mouth daily.  escitalopram (LEXAPRO) 20 MG tablet TAKE 1 TABLET AT BEDTIME Patient taking differently: Take 20 mg by mouth at bedtime.  fenofibrate 160 MG tablet TAKE 1 TABLET EVERY DAY Patient taking differently: Take 160 mg by mouth daily.  furosemide (LASIX) 80 MG tablet Take 0.5 tablets (40 mg total) by mouth daily as needed. Patient taking differently: Take 40 mg by mouth daily as  needed for fluid.  guaiFENesin (MUCINEX) 600 MG 12 hr tablet Take 2 tablets (1,200 mg total) by mouth 2 (two) times daily as needed. Patient taking differently: Take 1,200 mg by mouth 2 (two) times daily as needed for cough.  ipratropium-albuterol (DUONEB) 0.5-2.5 (3) MG/3ML SOLN Take 3 mLs by nebulization every 4 (four) hours as needed.  lisinopril (ZESTRIL) 5 MG tablet TAKE 1 TABLET EVERY DAY Patient taking differently: Take 5 mg by mouth daily.  potassium chloride SA (KLOR-CON M) 20 MEQ tablet TAKE 1 TABLET EVERY DAY WITH FOOD Patient taking differently: Take 20 mEq by mouth daily.  rosuvastatin (CRESTOR) 20 MG tablet TAKE 1 TABLET EVERY DAY Patient taking differently: Take 20 mg by mouth daily.  tiZANidine (ZANAFLEX) 2 MG tablet TAKE 1 TABLET THREE TIMES DAILY Patient taking differently: Take 2 mg by mouth 3 (three) times daily.  traMADol (ULTRAM) 50 MG tablet Take 1 tablet (50 mg total) by mouth every 6 (six) hours as needed. Patient taking differently: Take 50 mg by mouth every 6 (six) hours as needed for moderate pain.  traZODone (DESYREL) 50 MG tablet TAKE 1 TABLET (50 MG TOTAL) BY MOUTH AT BEDTIME AS NEEDED FOR SLEEP.  vitamin B-12 (CYANOCOBALAMIN) 1000 MCG tablet Take 500 mcg by mouth daily.  vitamin C (ASCORBIC ACID) 500 MG tablet Take 500 mg by mouth daily.  vitamin E 400 UNIT capsule Take 200 Units by mouth daily.  Azelastine-Fluticasone 137-50 MCG/ACT SUSP Place 1 spray into the nose every 12 (twelve) hours. Patient not taking: Reported on 09/11/2021  Blood Glucose Monitoring Suppl (TRUE METRIX METER) w/Device KIT USE AS DIRECTED  dexamethasone (DECADRON) 4 MG tablet Take 1 tablet (4 mg total) by mouth 2 (two) times daily with a meal. Patient not taking: Reported on 09/19/2021  glucose blood (TRUE METRIX BLOOD GLUCOSE TEST) test strip TEST  UP  TO FOUR TIMES DAILY AS DIRECTED  levothyroxine (SYNTHROID) 100 MCG tablet TAKE 1 TABLET EVERY DAY BEFORE BREAKFAST Patient not taking:  Reported on 09/29/2021  lidocaine-prilocaine (EMLA) cream 30 Squeeze  1/2 tsp on a cotton ball and apply to skin over port a cath site. Do not rub it in. Cover with plastic wrap. Apply 1-2 hours prior to your treatment. Patient not taking: Reported on 09/17/2021  memantine (NAMENDA) 10 MG tablet Take 1 tablet (10 mg total) by mouth 2 (two) times daily. Patient not taking: Reported on 09/24/2021  memantine (NAMENDA) 5 MG tablet Begin this prescription the first day of brain radiation. Week 1: take one tablet po qam. Week 2: take one tablet qam and qpm. Week 3: take two tablets qam, and  one tablet po q pm. Week 4: take two tablets qam and qpm. Fill subsequent prescription q month. Patient not taking: Reported on 09/24/2021  prochlorperazine (COMPAZINE) 10 MG tablet Take 1 tablet (10 mg total) by mouth every 6 (six) hours as needed for nausea or vomiting. Patient not taking: Reported on 09/23/2021  TRUEplus Lancets 33G MISC TEST  UP  TO FOUR TIMES DAILY AS DIRECTED    Physical Exam: Vitals:   09/25/2021 2306 09/16/2021 2307 10/02/2021 2310 09/24/2021 2313  BP:      Pulse: 90 85 82 80  Resp: (!) 21 18 20  (!) 21  Temp:      TempSrc:      SpO2: (!) 84% 93% 100% 100%  Gen: Elderly, chronically ill-appearing female in no distress Pulm: Nonlabored tachypnea on 6L O2, not cooperative with posterior exam. No wheezes, Probable crackles at bases. CV: Regular rate and rhythm. No murmur, rub, or gallop. No JVD, 2+ dependent edema. GI: Abdomen soft, non-tender, non-distended, with normoactive bowel sounds.  Ext: Warm, dry with no deformity, scattered ecchymoses x4 extremities. Skin: Healed wound on left foot, no open wounds on visualized skin. Right upper chest port site accessed, c/d/I without erythema. Neuro: Alert, answers questions unreliably with one word answers, keeps eyes closed otherwise. Restless and fidgety moving all extremities, not cooperative with full exam. PERRL.   Psych: Judgement and insight appear  impaired.   Data Reviewed: Lactic acid 4.9 > 1.4. Anion gap 11 Bicarb 26 (chronically 35-36) Cr 0.68 (baseline 0.4) BNP 467 Troponin 100 > 139 Albumin 3.1 TBili 1.3 WBC 6.6k (previous 2.8), ANC 4.9k, ALC 300.  Hgb 9.4 (s/p 2u PRBCs 5/25 for 7.2g/dl) Plt 155k UA: protein, +bacteria, 21-50 WBC/HPF, +hyaline cast, zero RBCs.  CXR 1v: Cardiomegaly, vascular congestion on background on emphysema.   Assessment and Plan: Acute on chronic hypoxic respiratory failure: Documented hx COPD, has duonebs at home, and pulmonary fibrosis in addition to lung CA contributing.  - Continue abx empirically - With suggestion of vascular congestion, LE edema, cardiomegaly, elevated BNP and troponin, we will check echocardiogram. Stop IVF. If hypoxia progresses, would have low threshold to diurese.  - CTA chest to r/o PE in this oncology patient. Will also evaluate parenchyma.   Right pneumothorax: Size is small, anterior per my discussion with radiology, Dr. Sherrye Payor - PCCM consulted for further recommendations. D/w Dr. Patsey Berthold.   Lactic acidosis w/acute encephalopathy: Also with pyuria.  - Send urine culture, monitor blood cultures.  - Continue broad IV antibiotic coverage for now in immunosuppressed patient - Delirium precautions - Lactic acid cleared, will not continue IVF at this time. - Check head CT given worsening mental status.   SCLC metastatic to liver and brain: Dx May 2022, followed by Dr. Earlie Server. 1st line chemo Jun-Aug 2022, whole brain irradiation by Dr. Lisbeth Renshaw March 2023. 2nd line chemotherapy started March 2023, ongoing.  - Follow up with Dr. Earlie Server.  - Consult palliative care, they were to evaluate patient in outpatient setting.  OSA:  - Can use CPAP overnight  Frequent falls: Pt will need 24 hour supervision.  - PT/OT/TOC consult.   Depression:  - Continue home SSRI  Hypothyroidism:  - Recheck TSH (has been > 1 year) - Pt's son reports pt not taking synthroid.   Anemia  due to antineoplastic chemotherapy: s/p 2u PRBCs 5/25, hgb up as would be expected.  - Continue monitoring  Hypoalbuminemia: Noted  Demand myocardial ischemia: Troponin mildly elevated.  - Trend again in AM to  confirm it is not rising.  - Not on ASA or BB at baseline.   HTN, SVT:  - Continue diltiazem, hold lisinopril  HLD:  - Hold statin for now  Advance Care Planning: DNR/DNI confirmed on admission. Consults: PCCM Family Communication: Son at bedside Severity of Illness: The appropriate patient status for this patient is INPATIENT. Inpatient status is judged to be reasonable and necessary in order to provide the required intensity of service to ensure the patient's safety. The patient's presenting symptoms, physical exam findings, and initial radiographic and laboratory data in the context of their chronic comorbidities is felt to place them at high risk for further clinical deterioration. Furthermore, it is not anticipated that the patient will be medically stable for discharge from the hospital within 2 midnights of admission.   * I certify that at the point of admission it is my clinical judgment that the patient will require inpatient hospital care spanning beyond 2 midnights from the point of admission due to high intensity of service, high risk for further deterioration and high frequency of surveillance required.*  Author: Patrecia Pour, MD 09/08/2021 1:26 AM  For on call review [email or web address].

## 2021-09-08 NOTE — Consult Note (Addendum)
NAME:  Dawn Gomez, MRN:  [id number], DOB:  07/14/1935, LOS: 1 ADMISSION DATE:  09/14/2021, CONSULTATION DATE:  09/08/20 REFERRING MD:  Bonner Puna, CHIEF COMPLAINT:  shortness of breath   bRIEF  86 yo woman found on ground with difficulty breathing.  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 Normally on home o2 4L.   Initial sat 40-50s, improved on NRB 15 L to 97%.    Was initially doing ok on Nasal canula in ED, then at around 11pm, started needing NRB.   Initially elevated lactic acid.    Very confused on arrival to ICU.  Noted pyuria from hosp H and P.   Started on cefepime flagyl vanc   Trop elevated BNP mildly elevated.     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   Had P mentions pulmonary Fibrosis?    Meds: decadron 4mg  BID (06/20/21) Dilt 180mg  daily, lexapro 20mg  qHS, fenofibrate 160mg  daily,  Lasix 40mg  prn , guaifenesin 1200BID prn Duonebs prn, synthroid 165mcg daily, lisinopril 5mg  daily namenda 10mg  BID , crestor 20 mg daily, tizanidine 2mg  TID, tramadol 50 mg QID   has a past medical history of Abnormal heart rhythm, Aortic aneurysm ([place]), Aortic atherosclerosis ([place]) (03/03/2019), Coronary artery calcification seen on CT scan (12/18/2020), Dyspnea, Hypothyroid, nscl ca (08/2020), OSA (obstructive sleep apnea), Psoriasis (02/05/2017), Pulmonary fibrosis ([place]), SVT (supraventricular tachycardia) ([place]), and Thyroid disease.    has a past surgical  history that includes Total abdominal hysterectomy; Tubal ligation; Craniotomy; Abdominal hysterectomy; Hernia repair; Appendectomy; Craniotomy (1980 x 2); Cholecystectomy; and IR IMAGING GUIDED PORT INSERTION (09/15/2020).   Interim History / Subjective:    09/08/21 - On face mask o2, 7L at 35%. On precedex gt at 0.4 -> HR 60. Higher doses resulted in HR 40s. Borderline comfort v restlessness. REports being uncomfortable but denies pain - this is when she is lucid. Otherweise intermittenly agitated.  Not on pressors. CT chest with large bleb Rt lund inferiourly and small Ptx - apical Right (not visiible on cxr). Has emphhuyema and ILD as well. No aBG  Objective   Blood pressure (!) 142/67, pulse 67, temperature 98.7 F (37.1 C), temperature source Axillary, resp. rate (!) 23, height 4\' 10"  (1.473 m), weight 71.1 kg, SpO2 90 %.    FiO2 (%):  [35 %-50 %] 35 %   Intake/Output Summary (Last 24 hours) at 09/08/2021 0721 Last data filed at 09/08/2021 0720 Gross per 24 hour  Intake 564.88 ml  Output 360 ml  Net 204.88 ml   Filed Weights   09/08/21 0130  Weight: 71.1 kg   General Appearance:  Looks criticall ill OBESE - + Head:  Normocephalic, without obvious abnormality, atraumatic. ALOPECIA from chemo + Eyes:  PERRL - yes, conjunctiva/corneas - muddy     Ears:  Normal external ear canals, both ears Nose:  G tube - no but has face mask Throat:  ETT TUBE - no , OG tube - no. Face Mask o3. Neck:  Supple,  No  enlargement/tenderness/nodules Lungs: Clear to auscultation bilaterally,  Tachypneic. Hypoxemic. Paradoxcial RR 20s Heart:  S1 and S2 normal, no murmur, CVP - no.  Pressors - no Abdomen:  Soft, no masses, no organomegaly Genitalia / Rectal:  Not done Extremities:  Extremities- intact Skin:  ntact in exposed areas . Sacral area - not examind Neurologic:  Sedation - precedex gtt -> RASS - 0 to +1/+2 . Moves all 4s - yes. CAM-ICU - POSITIVE . Orientation - not fully     Resolved Hospital  Problem list     Assessment & Plan:   ASSESSMENT / PLAN: ONCOLOGY  - Extensive stage (T3, N2, M1 C) small cell lung cancer presented with large left lower lobe lung mass in addition to right hilar and mediastinal lymphadenopathy as well as satellite nodule in the left lower lobe and peripheral pleural lesion as well as metastatic liver lesions diagnosed in May 2022.    - subsequent development fo brain mets March 2023  - Second line systemic chemotherapy with carboplatin for AUC of 5 on day 1 and etoposide 100 Mg/M2 on days 1, 2 and 3 with Cosela 240 Mg/M2 before chemotherapy every 3 weeks.  First dose June 12, 2021.  Status post 4 cycles. Last 08/21/21  - ECOG 2-3 as of mid-amay 2023 with frequent falls   - CT Chest 07/27/21 with partial respoinse  - CT head 09/08/21 with parial resoonse   Plan  - paged oncology for current progrnosis   PULMONARY  A:  COPD with blebs Pulmonary Fibrosis (PF not formally dx) - baseline 4L [place] - Prior to & Present on Admit Acute on chronic resp failure with worsening - Present on Admit Small RT apical ptx - Present on Admit   09/08/2021 -> Paradoxical and oon face mask o2. Rpeat CXr - ? Very small apical bleb but not clearly visitble  P:   CXR stat O2 for pulse ox > 88% ABG BiPAP likely contraindicated due to Ptx Chest tube if ptx worsens but there is high risk for permanent BPF given blebs DNI  NEUROLOGIC A:   Opioid allergies NOS - "dilaudid dmentia" - Prior to & Present on Admit Remote hx of craniotomy 1980s - Prior to & Present on Admit SSC with brain mets - march 2023 - Prior to & Present on Admit  -on 2nd line chemo Dementia - Prior to & Present on Admit - on Namenda Chronic pain - Prior to & Present on Admit -on Tramadol  Acute encephalopathy - Present on Admit  6/3 - confusion continues. Precedex helping  P:   Precedex gtt Start fent prn (no evideence of true opioid allergy - d/w Pharm) Haldol prn Versed  prn    METABOLIC  A Laccitic acidosis - Present on Admit  6/3 - resolved after fluids  Plan  - monitor  HEMATOLOGIC   - HEME A:  Chronic anemia -from chemo and chronic disease - Prior to & Present on Admit  6/3- no bleeding   P:  - PRBC for hgb </= 6.9gm%    - exceptions are   -  if ACS susepcted/confirmed then transfuse for hgb </= 8.0gm%,  or    -  active bleeding with hemodynamic instability, then transfuse regardless of hemoglobin value   At at all times try to transfuse 1 unit prbc as possible with exception of active hemorrhage    HEMATOLOGIC - Platelets A Thrombocytopenia - moderate - Present on Admit - due to chemo v sepsis  6/3 -  improving  P Monitor DVT proph by triad  ENDOCRINE A:   Vit D def hx -Prior to & Present on Admit     P:   Per triad  [place] home - as of spring 2023. Including beign found down > 8h end May 2023 Poor living condition - [place] Baseline bruises over body SEvere protien calorie malnutritio - Prior to & Present on Admit - admit alb 2.5   Plan  - per triad    Best practice:  Diet: npo Pain/Anxiety/Delirium protocol (if indicated): see neur VAP protocol (if indicated): na DVT prophylaxis: per triad GI prophylaxis: per triad Glucose control: per triad Mobility: bed rest Code Status: DNR at admit  Family Communication: called Son Shanena Pellegrino - [phone] ->  8:06am - > he called back 8.35am - did explain to him about poor prognosis. Explained while not actively dying in hours her condition is very likely terminal and comfort care is appropriate. Explained ok to get family in.  And overall prognosis is bad that comfort measure is very appropriate. Advised appropriate ok to get family to visit her 09/08/2021.    Disposition: ICU   PRognsosi - poor. Terminal care appropriate    ATTESTATION & SIGNATURE   The patient Dawn Gomez is critically ill with multiple organ systems failure  and requires high complexity decision making for assessment and support, frequent evaluation and titration of therapies, application of advanced monitoring technologies and extensive interpretation of multiple databases.   Critical Care Time devoted to patient care services described in this note is  45  Minutes. This time reflects time of care of this signee Dr Brand Males. This critical care time does not reflect procedure time, or teaching time or supervisory time of PA/NP/Med student/Med Resident etc but could involve care discussion time     Dr. Brand Males, M.D., [facility].C.P Pulmonary and Critical Care Medicine Medical Director - [facility] ICU Staff Physician, [place] [facility] Pager: [phone], If no answer or between  15:00h - 7:00h: call [phone]  09/08/2021 7:22 AM     LABS    PULMONARY No results for input(s): PHART, PCO2ART, PO2ART, HCO3, TCO2, O2SAT in the last 168 hours.  Invalid input(s): PCO2, PO2  CBC Recent Labs  Lab 09/06/21 1350 09/14/2021 1913 09/08/21 0430  HGB 8.3* 9.4* 7.4*  HCT 25.4* 29.3* 22.7*  WBC 2.8* 6.6 4.4  PLT 92* 155 140*    COAGULATION Recent Labs  Lab 09/14/2021 1913  INR 1.2    CARDIAC  No results for input(s): TROPONINI in the last 168 hours. No results for input(s): PROBNP in the last 168 hours.   CHEMISTRY Recent Labs  Lab 09/06/21 1350 09/25/2021 1913 09/08/21 0430  NA 138 140 137  K 3.6 4.2 3.2*  CL 98 103 102  CO2 36* 26 29  GLUCOSE 109* 198* 105*  BUN 10 11 8   CREATININE 0.43* 0.68 0.47  CALCIUM 9.4 9.6 8.7*   Estimated Creatinine Clearance: 44.6 mL/min (by C-G formula based on SCr of 0.47 mg/dL).   LIVER Recent Labs  Lab 09/06/21 1350 09/08/2021 1913 09/08/21 0430  AST 19 36 35  ALT 29 29 28   ALKPHOS 45 51 41  BILITOT 1.1 1.3* 1.0  PROT 5.7* 6.4* 4.9*  ALBUMIN 3.3* 3.1* 2.5*  INR  --  1.2  --      INFECTIOUS Recent Labs  Lab  09/12/2021  1913 09/23/2021 2241  LATICACIDVEN 4.9* 1.4     ENDOCRINE CBG (last 3)  No results for input(s): GLUCAP in the last 72 hours.       IMAGING x48h  - image(s) personally visualized  -   highlighted in bold CT HEAD WO CONTRAST (5MM)  Result Date: 09/08/2021 CLINICAL DATA:  Initial evaluation for mental status change, unknown cause. EXAM: CT HEAD WITHOUT CONTRAST TECHNIQUE: Contiguous axial images were obtained from the base of the skull through the vertex without intravenous contrast. RADIATION DOSE REDUCTION: This exam was performed according to the departmental dose-optimization program which includes automated exposure control, adjustment of the mA and/or kV according to patient size and/or use of iterative reconstruction technique. COMPARISON:  CT from 06/17/2021. FINDINGS: Brain: Postoperative changes from prior left-sided craniectomy with cranioplasty. Postoperative encephalomalacia within the underlying left frontotemporal region, stable. Previously seen left parafalcine mass at the left parieto-occipital region is markedly decreased in size now measuring approximately 9 mm. Associated vasogenic edema has markedly improved from prior. Additional noted metastatic lesion involving the anterior right frontal lobe is now low longer clearly visible by CT. No significant residual edema within this region. No new mass lesion or vasogenic edema. No acute intracranial hemorrhage. No visible large vessel territory infarct. No extra-axial fluid collection. Ventricles normal size without hydrocephalus. Vascular: No hyperdense vessel. Skull: Left pterional craniectomy with cranioplasty. Calvarium otherwise intact. No acute scalp soft tissue abnormality. Sinuses/Orbits: Globes orbital soft tissues demonstrate no acute finding. Scattered mucosal thickening noted within the sphenoid ethmoidal sinuses. No mastoid effusion. Other: None. IMPRESSION: 1. Interval decrease in size of left parafalcine mass,  now measuring 9 mm, with markedly improved vasogenic edema. Additional right frontal lobe lesion now no longer clearly visualized. Findings consistent with interval response to therapy. No new lesions identified. 2. No other acute intracranial abnormality. 3. Sequelae of prior left pterional craniectomy with cranioplasty. Electronically Signed   By: Jeannine Boga M.D.   On: 09/08/2021 01:14   CT Angio Chest Pulmonary Embolism (PE) W or WO Contrast  Result Date: 09/08/2021 CLINICAL DATA:  Respiratory distress. EXAM: CT ANGIOGRAPHY CHEST WITH CONTRAST TECHNIQUE: Multidetector CT imaging of the chest was performed using the standard protocol during bolus administration of intravenous contrast. Multiplanar CT image reconstructions and MIPs were obtained to evaluate the vascular anatomy. RADIATION DOSE REDUCTION: This exam was performed according to the departmental dose-optimization program which includes automated exposure control, adjustment of the mA and/or kV according to patient size and/or use of iterative reconstruction technique. CONTRAST:  37mL OMNIPAQUE IOHEXOL 350 MG/ML SOLN COMPARISON:  July 27, 2021 FINDINGS: Cardiovascular: A right-sided venous catheter is in place. There is marked severity calcification of the thoracic aorta, without evidence of aortic aneurysm the subsegmental pulmonary arteries are limited in evaluation secondary to overlying areas of artifact and suboptimal opacification with intravenous contrast. No evidence of pulmonary embolism. Normal heart size. No pericardial effusion. Mediastinum/Nodes: No enlarged mediastinal, hilar, or axillary lymph nodes. Thyroid gland, trachea, and esophagus demonstrate no significant findings. Lungs/Pleura: Diffuse interstitial thickening is seen with mild to moderate severity atelectatic changes noted within the bilateral upper lobes and posterior aspect of the bilateral lower lobes. A large, stable, approximately 9.7 cm x 4.0 cm right basilar  bleb is seen. There is no evidence of a pleural effusion. A small anterior right-sided pneumothorax is present. This extends from the anterior aspect of the right apex to the right lung base where it measures approximately 2.9 cm in maximum AP measurement. This represents  a new finding when compared to the prior exam. Upper Abdomen: No acute abnormality. Musculoskeletal: Multilevel degenerative changes are seen throughout the thoracic spine. Other: It should be noted that the study is limited secondary to patient motion. Review of the MIP images confirms the above findings. IMPRESSION: 1. Small anterior right-sided pneumothorax. 2. Diffuse interstitial thickening with mild to moderate severity bilateral upper lobe and bibasilar atelectasis. 3. Marked severity emphysematous lung disease with a large, stable right basilar bleb. Aortic Atherosclerosis (ICD10-I70.0). Electronically Signed   By: Virgina Norfolk M.D.   On: 09/08/2021 00:28   DG Chest Port 1 View  Result Date: 10/05/2021 CLINICAL DATA:  Respiratory distress EXAM: PORTABLE CHEST 1 VIEW COMPARISON:  Chest CT 07/27/2021, radiograph 02/05/2017 FINDINGS: Right-sided central venous port tip over the cavoatrial region. Enlarged cardiomediastinal silhouette with mild vascular congestion. Underlying emphysema and chronic interstitial lung disease. Streaky bibasilar opacities may be due to atelectasis or scar. Known left lower lobe lung mass is better seen on CT. Aortic atherosclerosis IMPRESSION: 1. Cardiomegaly with suspected slight vascular congestion. 2. Underlying emphysema and chronic lung disease. Scarring or atelectasis at the bases. Known left lower lobe lung mass is better seen on CT Electronically Signed   By: Donavan Foil M.D.   On: 09/27/2021 19:36

## 2021-09-08 NOTE — Progress Notes (Signed)
eLink Physician-Brief Progress Note Patient Name: Dawn Gomez DOB: 1936/04/05 MRN: [id number]   Date of Service  09/08/2021  HPI/Events of Note  89 F with metastatic SCLC to liver and brain on chemo and s/p whole brain irradiation, chronic hypoxemic respiratory failure on 4L, COPD, OSA and dementia who presents after being found down after probably fall. Reported hypoxemic to 50% on home O2. O2 improved/weaned to 6L. CT head with decreased left brain mass and improved edema. Admitted to [facility]  On arrival to SDU she is agitated and requiring NRB for desats. Remains restless.  In ED CTA with unchanged RLL bleb but interval small anterior right PTX. Emphysema and atelectasis bilaterally  On recheck slightly more calm. Weaned to venturi mask at 12L. RN reports Right knee bruising/swelling  Trop 100>139, likely demand ischemia BNP 467  LA resolved  DNR  eICU Interventions  PCCM consulted for pneumothorax Wean O2 for goal SpO2 >88% BiPAP when able Continue antibiotics Echo Stop IVF     Intervention Category Evaluation Type: New Patient Evaluation  [name] Rodman Pickle 09/08/2021, 1:43 AM

## 2021-09-08 NOTE — Progress Notes (Signed)
Patient received from ED, noted to be extremely confused. Patient is restless, pulling at monitoring equipment and oxygen devices. Mitts placed on patient for patient safety. RT called to bedside to change NRB to venturi mask. Patient alert to self and can answer simple questions. MD and Elink presented on screen in room and patient care plan discussed. Waiting for orders at this time.

## 2021-09-08 NOTE — Progress Notes (Signed)
Pharmacy Antibiotic Note  Dawn Gomez is a 86 y.o. female admitted on 09/21/2021 with sepsis.  Pharmacy has been consulted for Cefepime & Vancomycin dosing.  Plan: Cefepime 2gm IV q12h Vancomycin 500mg  IV q24h to target AUC 400-550 Check Vancomycin levels at steady state Monitor renal function and cx data   Height: 4\' 10"  (147.3 cm) Weight: 71.1 kg (156 lb 12 oz) IBW/kg (Calculated) : 40.9  Temp (24hrs), Avg:98.7 F (37.1 C), Min:98 F (36.7 C), Max:99.4 F (37.4 C)  Recent Labs  Lab 09/06/21 1350 09/29/2021 1913 10/02/2021 2241  WBC 2.8* 6.6  --   CREATININE 0.43* 0.68  --   LATICACIDVEN  --  4.9* 1.4    Estimated Creatinine Clearance: 44.6 mL/min (by C-G formula based on SCr of 0.68 mg/dL).    Allergies  Allergen Reactions   Lipitor [Atorvastatin] Shortness Of Breath, Diarrhea and Other (See Comments)    Dizzy, pain all over.   Codeine    Hydromorphone Hcl     REACTION: dementia   Levaquin [Levofloxacin In D5w]     Shock per pt but can take cipro   Morphine    Oxycodone-Acetaminophen    Propoxyphene N-Acetaminophen    Simvastatin     Antimicrobials this admission: 6/2 Cefepime >>  6/2 Vancomycin >>  6/2 Metronidazole >>  Dose adjustments this admission:  Microbiology results: 6/2 BCx:  6/3 MRSA PCR:  UCx (not collected prior to abx initiation)  Thank you for allowing pharmacy to be a part of this patient's care.  Netta Cedars PharmD 09/08/2021 1:56 AM

## 2021-09-09 ENCOUNTER — Inpatient Hospital Stay (HOSPITAL_COMMUNITY): Payer: Medicare HMO

## 2021-09-09 DIAGNOSIS — T451X5A Adverse effect of antineoplastic and immunosuppressive drugs, initial encounter: Secondary | ICD-10-CM | POA: Diagnosis not present

## 2021-09-09 DIAGNOSIS — J439 Emphysema, unspecified: Secondary | ICD-10-CM | POA: Diagnosis not present

## 2021-09-09 DIAGNOSIS — D6481 Anemia due to antineoplastic chemotherapy: Secondary | ICD-10-CM | POA: Diagnosis not present

## 2021-09-09 DIAGNOSIS — J90 Pleural effusion, not elsewhere classified: Secondary | ICD-10-CM | POA: Diagnosis not present

## 2021-09-09 DIAGNOSIS — C7931 Secondary malignant neoplasm of brain: Secondary | ICD-10-CM | POA: Diagnosis not present

## 2021-09-09 DIAGNOSIS — G9340 Encephalopathy, unspecified: Secondary | ICD-10-CM | POA: Diagnosis not present

## 2021-09-09 DIAGNOSIS — C3432 Malignant neoplasm of lower lobe, left bronchus or lung: Secondary | ICD-10-CM | POA: Diagnosis not present

## 2021-09-09 DIAGNOSIS — E876 Hypokalemia: Secondary | ICD-10-CM | POA: Diagnosis not present

## 2021-09-09 DIAGNOSIS — I248 Other forms of acute ischemic heart disease: Secondary | ICD-10-CM | POA: Diagnosis not present

## 2021-09-09 DIAGNOSIS — Z7189 Other specified counseling: Secondary | ICD-10-CM | POA: Diagnosis not present

## 2021-09-09 DIAGNOSIS — J939 Pneumothorax, unspecified: Secondary | ICD-10-CM | POA: Diagnosis not present

## 2021-09-09 DIAGNOSIS — J9621 Acute and chronic respiratory failure with hypoxia: Secondary | ICD-10-CM | POA: Diagnosis not present

## 2021-09-09 DIAGNOSIS — Z515 Encounter for palliative care: Secondary | ICD-10-CM | POA: Diagnosis not present

## 2021-09-09 LAB — COMPREHENSIVE METABOLIC PANEL
ALT: 25 U/L (ref 0–44)
AST: 26 U/L (ref 15–41)
Albumin: 2.4 g/dL — ABNORMAL LOW (ref 3.5–5.0)
Alkaline Phosphatase: 44 U/L (ref 38–126)
Anion gap: 8 (ref 5–15)
BUN: 9 mg/dL (ref 8–23)
CO2: 33 mmol/L — ABNORMAL HIGH (ref 22–32)
Calcium: 8.3 mg/dL — ABNORMAL LOW (ref 8.9–10.3)
Chloride: 99 mmol/L (ref 98–111)
Creatinine, Ser: 0.47 mg/dL (ref 0.44–1.00)
GFR, Estimated: >60 mL/min (ref >60)
Glucose, Bld: 80 mg/dL (ref 70–99)
Potassium: 2.6 mmol/L — CL (ref 3.5–5.1)
Sodium: 140 mmol/L (ref 135–145)
Total Bilirubin: 0.9 mg/dL (ref 0.3–1.2)
Total Protein: 4.8 g/dL — ABNORMAL LOW (ref 6.5–8.1)

## 2021-09-09 LAB — CBC
HCT: 23.2 % — ABNORMAL LOW (ref 36.0–46.0)
Hemoglobin: 7.4 g/dL — ABNORMAL LOW (ref 12.0–15.0)
MCH: 30.2 pg (ref 26.0–34.0)
MCHC: 31.9 g/dL (ref 30.0–36.0)
MCV: 94.7 fL (ref 80.0–100.0)
Platelets: 155 10*3/uL (ref 150–400)
RBC: 2.45 MIL/uL — ABNORMAL LOW (ref 3.87–5.11)
RDW: 20.6 % — ABNORMAL HIGH (ref 11.5–15.5)
WBC: 3.7 10*3/uL — ABNORMAL LOW (ref 4.0–10.5)
nRBC: 1.6 % — ABNORMAL HIGH (ref 0.0–0.2)

## 2021-09-09 LAB — BASIC METABOLIC PANEL
Anion gap: 8 (ref 5–15)
BUN: 10 mg/dL (ref 8–23)
CO2: 30 mmol/L (ref 22–32)
Calcium: 8 mg/dL — ABNORMAL LOW (ref 8.9–10.3)
Chloride: 101 mmol/L (ref 98–111)
Creatinine, Ser: 0.4 mg/dL — ABNORMAL LOW (ref 0.44–1.00)
GFR, Estimated: >60 mL/min (ref >60)
Glucose, Bld: 76 mg/dL (ref 70–99)
Potassium: 3.8 mmol/L (ref 3.5–5.1)
Sodium: 139 mmol/L (ref 135–145)

## 2021-09-09 LAB — MAGNESIUM
Magnesium: 1.3 mg/dL — ABNORMAL LOW (ref 1.7–2.4)
Magnesium: 2.2 mg/dL (ref 1.7–2.4)

## 2021-09-09 LAB — PHOSPHORUS: Phosphorus: 3.8 mg/dL (ref 2.5–4.6)

## 2021-09-09 LAB — LACTIC ACID, PLASMA: Lactic Acid, Venous: 0.6 mmol/L (ref 0.5–1.9)

## 2021-09-09 MED ORDER — MAGNESIUM SULFATE 4 GM/100ML IV SOLN
4.0000 g | Freq: Once | INTRAVENOUS | Status: AC
  Administered 2021-09-09: 4 g via INTRAVENOUS
  Filled 2021-09-09: qty 100

## 2021-09-09 MED ORDER — POTASSIUM CHLORIDE 10 MEQ/100ML IV SOLN
10.0000 meq | INTRAVENOUS | Status: AC
  Administered 2021-09-09 (×6): 10 meq via INTRAVENOUS
  Filled 2021-09-09 (×6): qty 100

## 2021-09-09 NOTE — Progress Notes (Signed)
Nutrition Brief Note  Chart reviewed d/t MST score of 3. Patient has a poor prognosis and medical team is recommending comfort care.  Palliative Care team is following. Plans for extended family visitation today, then re-meet with Palliative care team. Patient is currently on clear liquids. No nutrition interventions planned at this time.  Please re-consult as needed.   Lucas Mallow RD, LDN, CNSC Please refer to Amion for contact information.

## 2021-09-09 NOTE — Consult Note (Signed)
Consultation Note Date: 09/09/2021   Patient Name: Dawn Gomez  DOB: 1935/10/25  MRN: [id number]  Age / Sex: 86 y.o., female  PCP: Biagio Borg, MD Referring Physician: Modena Jansky, MD  Reason for Consultation: Establishing goals of care  HPI/Patient Profile: 86 y.o. female  with past medical history of Small cell lung cancer with mets to liver and brain currently on second line chemotherapy and status post whole brain radiation, depression, memory impairment, hyperlipidemia, hypothyroidism, chronic hypoxic respiratory failure on 4 L O2, OSA, COP admitted on 09/22/2021 after being found down and in respiratory distress.  Work-up revealed small right pneumothorax, large bleb, and acute on chronic hypoxic respiratory failure.  Palliative consulted for goals of care.  Clinical Assessment and Goals of Care: I met today with Ms. Heo and her family including her son, brother, daughter-in-law, niece, and her pastor.   Ms. Fulgham awakens and is able to answer a couple of simple questions but tires out very easily and cannot really participate in true goals of care discussion.  While she spoke with her pastor, I talked with her family including son and brother.  Her son, Grayland Ormond, is her HCPOA and copy of this documentation is located on the chart.  We discussed clinical course as well as wishes moving forward in regard to advanced directives.  Concepts specific to code status and care plan this rehospitalization discussed.  We discussed difference between a aggressive medical intervention path and a palliative, comfort focused care path.  Values and goals of care important to patient and family were attempted to be elicited.  Grayland Ormond reports that he has been told by other physicians that Ms. Wetherington will likely not survive this admission.  He expressed working to come to terms with this but wants to focus on her  spending time with family today.  He expressed that as she is awake, alert, and not in distress he would want to continue with current interventions.  We discussed the continued escalation of care is not likely to help her feel better or add quality time to her life and he is in agreement with this.  Discussed we will continue to see her clinical course over the weekend but if we were seeing signs of distress and suffering this would be an indication to shift her care plan to ensure we are focusing on her comfort sooner rather than later.   Concept of Hospice and Palliative Care were discussed   Questions and concerns addressed.   PMT will continue to support holistically.  SUMMARY OF RECOMMENDATIONS   - DNR/DNI -Continue current interventions.  Discussed with son today and he understands that Ms. Hight may not improve regardless of interventions moving forward.  At this point, as she is awake, not complaining of pain, and not complaining of shortness of breath, he is focused on allowing family to come and see her and spend time while she is still awake and interactive and able to do so. -Discussed that escalating care to intervention such as  BiPAP or mechanical ventilation are not going to fix her underlying issues and her not going to improve her comfort.  He expressed understanding and would like to continue with current care at this time. -Palliative to continue to follow.  Code Status/Advance Care Planning: DNR Prognosis:  Guarded/poor  Discharge Planning: To Be Determined      Primary Diagnoses: Present on Admission:  Acute on chronic respiratory failure with hypoxia ([place])  Depression  Emphysema lung (HCC)  Hyperlipidemia  Hypothyroidism  Interstitial lung disease ([place])  Malignant neoplasm metastatic to brain ([place])  Psoriasis  Small cell carcinoma of lower lobe of left lung (HCC)  SVT (supraventricular tachycardia) (HCC)  OSA (obstructive sleep apnea)  Anemia due to  antineoplastic chemotherapy  Hypoalbuminemia  Pyuria  Lactic acidosis  Acute encephalopathy  Demand ischemia (HCC)  Volume overload  Pneumothorax  HTN (hypertension)   I have reviewed the medical record, interviewed the patient and family, and examined the patient. The following aspects are pertinent.  Past Medical History:  Diagnosis Date   Abnormal heart rhythm    Aortic aneurysm (HCC)    Aortic atherosclerosis ([place]) 03/03/2019   Coronary artery calcification seen on CT scan 12/18/2020   Dyspnea    Hypothyroid    nscl ca 08/2020   OSA (obstructive sleep apnea)    on cpap   Psoriasis 02/05/2017   Pulmonary fibrosis (HCC)    SVT (supraventricular tachycardia) (HCC)    Thyroid disease    hypo   Social History   Socioeconomic History   Marital status: Single    Spouse name: Not on file   Number of children: 1   Years of education: 16   Highest education level: Not on file  Occupational History   Occupation: retired Therapist, sports   Occupation: retired  Tobacco Use   Smoking status: Former    Packs/day: 2.00    Years: 50.00    Pack years: 100.00    Types: Cigarettes    Quit date: 2008    Years since quitting: 15.4   Smokeless tobacco: Never  Substance and Sexual Activity   Alcohol use: Yes    Comment: "drank back in her younger wilder days"   Drug use: No   Sexual activity: Not on file  Other Topics Concern   Not on file  Social History Narrative   ** Merged History Encounter **       Social Determinants of Health   Financial Resource Strain: Low Risk    Difficulty of Paying Living Expenses: Not hard at all  Food Insecurity: No Food Insecurity   Worried About Charity fundraiser in the Last Year: Never true   [place] in the Last Year: Never true  Transportation Needs: No Transportation Needs   Lack of Transportation (Medical): No   Lack of Transportation (Non-Medical): No  Physical Activity: Sufficiently Active   Days of Exercise per Week: 5 days    Minutes of Exercise per Session: 30 min  Stress: No Stress Concern Present   Feeling of Stress : Not at all  Social Connections: Moderately Integrated   Frequency of Communication with Friends and Family: More than three times a week   Frequency of Social Gatherings with Friends and Family: More than three times a week   Attends Religious Services: More than 4 times per year   Active Member of [employer] or Organizations: Yes   Attends Archivist Meetings: More than 4 times per year   Marital Status:  Divorced   Family History  Problem Relation Age of Onset   Emphysema Father    Heart disease Father    Clotting disorder Father    Rheum arthritis Father    Lung cancer Father    Prostate cancer Father    Bone cancer Father    Emphysema Sister    Emphysema Mother    Heart disease Mother    Rheum arthritis Mother    Lung cancer Mother    Allergies Sister    Clotting disorder Sister    Rheum arthritis Sister    Breast cancer Sister    Cancer Sister        ureter   Scheduled Meds:  chlorhexidine  15 mL Mouth Rinse BID   Chlorhexidine Gluconate Cloth  6 each Topical Daily   enoxaparin (LOVENOX) injection  40 mg Subcutaneous Q24H   mouth rinse  15 mL Mouth Rinse q12n4p   sodium chloride flush  3 mL Intravenous Q12H   Continuous Infusions:  ceFEPime (MAXIPIME) IV Stopped (09/09/21 6979)   dexmedetomidine (PRECEDEX) IV infusion 0.3 mcg/kg/hr (09/09/21 0832)   magnesium sulfate bolus IVPB 4 g (09/09/21 0834)   potassium chloride 100 mL/hr at 09/09/21 0832   PRN Meds:.acetaminophen **OR** acetaminophen, fentaNYL (SUBLIMAZE) injection, haloperidol lactate, ipratropium-albuterol, midazolam, ondansetron **OR** ondansetron (ZOFRAN) IV Medications Prior to Admission:  Prior to Admission medications   Medication Sig Start Date End Date Taking? Authorizing Provider  CALCIUM-MAGNESIUM-ZINC PO Take 1 tablet by mouth daily.   Yes [provider]  diltiazem (CARDIZEM CD) 180  MG 24 hr capsule TAKE 1 CAPSULE EVERY DAY Patient taking differently: Take 180 mg by mouth daily. 03/06/21  Yes Biagio Borg, MD  escitalopram (LEXAPRO) 20 MG tablet TAKE 1 TABLET AT BEDTIME Patient taking differently: Take 20 mg by mouth at bedtime. 05/14/21  Yes Biagio Borg, MD  fenofibrate 160 MG tablet TAKE 1 TABLET EVERY DAY Patient taking differently: Take 160 mg by mouth daily. 05/14/21  Yes Biagio Borg, MD  furosemide (LASIX) 80 MG tablet Take 0.5 tablets (40 mg total) by mouth daily as needed. Patient taking differently: Take 40 mg by mouth daily as needed for fluid. 06/18/21  Yes Biagio Borg, MD  guaiFENesin (MUCINEX) 600 MG 12 hr tablet Take 2 tablets (1,200 mg total) by mouth 2 (two) times daily as needed. Patient taking differently: Take 1,200 mg by mouth 2 (two) times daily as needed for cough. 02/08/20  Yes Biagio Borg, MD  ipratropium-albuterol (DUONEB) 0.5-2.5 (3) MG/3ML SOLN Take 3 mLs by nebulization every 4 (four) hours as needed. 08/10/16  Yes Domenic Polite, MD  lisinopril (ZESTRIL) 5 MG tablet TAKE 1 TABLET EVERY DAY Patient taking differently: Take 5 mg by mouth daily. 01/24/21  Yes Biagio Borg, MD  potassium chloride SA (KLOR-CON M) 20 MEQ tablet TAKE 1 TABLET EVERY DAY WITH FOOD Patient taking differently: Take 20 mEq by mouth daily. 04/23/21  Yes Biagio Borg, MD  rosuvastatin (CRESTOR) 20 MG tablet TAKE 1 TABLET EVERY DAY Patient taking differently: Take 20 mg by mouth daily. 05/14/21  Yes Biagio Borg, MD  tiZANidine (ZANAFLEX) 2 MG tablet TAKE 1 TABLET THREE TIMES DAILY Patient taking differently: Take 2 mg by mouth 3 (three) times daily. 05/14/21  Yes Biagio Borg, MD  traMADol (ULTRAM) 50 MG tablet Take 1 tablet (50 mg total) by mouth every 6 (six) hours as needed. Patient taking differently: Take 50 mg by mouth every 6 (six) hours  as needed for moderate pain. 08/08/21  Yes Biagio Borg, MD  traZODone (DESYREL) 50 MG tablet TAKE 1 TABLET (50 MG TOTAL) BY MOUTH AT  BEDTIME AS NEEDED FOR SLEEP. 01/24/21  Yes Biagio Borg, MD  vitamin B-12 (CYANOCOBALAMIN) 1000 MCG tablet Take 500 mcg by mouth daily.   Yes [provider]  vitamin C (ASCORBIC ACID) 500 MG tablet Take 500 mg by mouth daily.   Yes [provider]  vitamin E 400 UNIT capsule Take 200 Units by mouth daily.   Yes [provider]  Azelastine-Fluticasone 137-50 MCG/ACT SUSP Place 1 spray into the nose every 12 (twelve) hours. Patient not taking: Reported on 09/22/2021 08/17/20   Biagio Borg, MD  Blood Glucose Monitoring Suppl (TRUE METRIX METER) w/Device KIT USE AS DIRECTED 06/22/20   Biagio Borg, MD  dexamethasone (DECADRON) 4 MG tablet Take 1 tablet (4 mg total) by mouth 2 (two) times daily with a meal. Patient not taking: Reported on 09/29/2021 06/20/21   Heilingoetter, Cassandra L, PA-C  glucose blood (TRUE METRIX BLOOD GLUCOSE TEST) test strip TEST  UP  TO FOUR TIMES DAILY AS DIRECTED 04/23/21   Biagio Borg, MD  levothyroxine (SYNTHROID) 100 MCG tablet TAKE 1 [place] BREAKFAST Patient not taking: Reported on 09/23/2021 10/06/20   Biagio Borg, MD  lidocaine-prilocaine (EMLA) cream 30 Squeeze  1/2 tsp on a cotton ball and apply to skin over port a cath site. Do not rub it in. Cover with plastic wrap. Apply 1-2 hours prior to your treatment. Patient not taking: Reported on 09/23/2021 11/23/20   Curt Bears, MD  memantine (NAMENDA) 10 MG tablet Take 1 tablet (10 mg total) by mouth 2 (two) times daily. Patient not taking: Reported on 10/01/2021 06/21/21   Hayden Pedro, PA-C  memantine [facility]) 5 MG tablet Begin this prescription the first day of brain radiation. Week 1: take one tablet po qam. Week 2: take one tablet qam and qpm. Week 3: take two tablets qam, and one tablet po q pm. Week 4: take two tablets qam and qpm. Fill subsequent prescription q month. Patient not taking: Reported on 09/25/2021 06/21/21   Hayden Pedro, PA-C   prochlorperazine (COMPAZINE) 10 MG tablet Take 1 tablet (10 mg total) by mouth every 6 (six) hours as needed for nausea or vomiting. Patient not taking: Reported on 09/22/2021 08/31/20   Curt Bears, MD  TRUEplus Lancets 33G MISC TEST  UP  TO FOUR TIMES DAILY AS DIRECTED 04/23/21   Biagio Borg, MD   Allergies  Allergen Reactions   Lipitor [Atorvastatin] Shortness Of Breath, Diarrhea and Other (See Comments)    Dizzy, pain all over.   Codeine    Hydromorphone Hcl     REACTION: dementia   Levaquin [Levofloxacin In D5w]     Shock per pt but can take cipro   Morphine    Oxycodone-Acetaminophen    Propoxyphene N-Acetaminophen    Simvastatin    Review of Systems Denies complaints, but she appears little confused and is a poor historian  Physical Exam General: Sleepy but arousable  Heart: Regular rate and rhythm. No murmur appreciated. Lungs: Decreased air movement Ext: No significant edema Skin: Warm and dry  Vital Signs: BP (!) 122/44   Pulse 65   Temp 99 F (37.2 C) (Axillary)   Resp 19   Ht 4' 10"  (1.473 m)   Wt 67.3 kg   SpO2 (!) 89%   BMI  31.01 kg/m  Pain Scale: CPOT   Pain Score: Asleep   SpO2: SpO2: (!) 89 % O2 Device:SpO2: (!) 89 % O2 Flow Rate: .O2 Flow Rate (L/min): 9 L/min  IO: Intake/output summary:  Intake/Output Summary (Last 24 hours) at 09/09/2021 0925 Last data filed at 09/09/2021 3818 Gross per 24 hour  Intake 302.59 ml  Output 1205 ml  Net -902.41 ml    LBM: Last BM Date : 09/22/2021 Baseline Weight: Weight: 71.1 kg Most recent weight: Weight: 67.3 kg     Palliative Assessment/Data:   Flowsheet Rows    Flowsheet Row Most Recent Value  Intake Tab   Referral Department Hospitalist  Unit at Time of Referral ICU  Palliative Care Primary Diagnosis Cancer  Date Notified 09/08/21  Palliative Care Type New Palliative care  Reason for referral Clarify Goals of Care  Date of Admission 09/15/2021  Date first seen by Palliative Care 09/08/21  #  of days Palliative referral response time 0 Day(s)  # of days IP prior to Palliative referral 1  Clinical Assessment   Palliative Performance Scale Score 20%  Psychosocial & Spiritual Assessment   Palliative Care Outcomes   Patient/Family meeting held? Yes  Who was at the meeting? Son, brother       Time In: 51 Time Out: 1315 Time Total: 17 Greater than 50%  of this time was spent counseling and coordinating care related to the above assessment and plan.  Signed by: Micheline Rough, MD   Please contact Palliative Medicine Team phone at [phone] for questions and concerns.  For individual provider: See Shea Evans

## 2021-09-09 NOTE — Consult Note (Signed)
NAME:  Dawn Gomez, MRN:  [id number], DOB:  1935/08/10, LOS: 2 ADMISSION DATE:  09/25/2021, CONSULTATION DATE:  09/08/20 REFERRING MD:  Bonner Puna, CHIEF COMPLAINT:  shortness of breath   bRIEF  86 yo woman found on ground with difficulty breathing.  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 Normally on home o2 4L.   Initial sat 40-50s, improved on NRB 15 L to 97%.    Was initially doing ok on Nasal canula in ED, then at around 11pm, started needing NRB.   Initially elevated lactic acid.    Very confused on arrival to ICU.  Noted pyuria from hosp H and P.   Started on cefepime flagyl vanc   Trop elevated BNP mildly elevated.     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   Had P mentions pulmonary Fibrosis?    Meds: decadron 4mg  BID (06/20/21) Dilt 180mg  daily, lexapro 20mg  qHS, fenofibrate 160mg  daily,  Lasix 40mg  prn , guaifenesin 1200BID prn Duonebs prn, synthroid 129mcg daily, lisinopril 5mg  daily namenda 10mg  BID , crestor 20 mg daily, tizanidine 2mg  TID, tramadol 50 mg QID   has a past medical history of Abnormal heart rhythm, Aortic aneurysm ([place]), Aortic atherosclerosis ([place]) (03/03/2019), Coronary artery calcification seen on CT scan (12/18/2020), Dyspnea, Hypothyroid, nscl ca (08/2020), OSA (obstructive sleep apnea), Psoriasis (02/05/2017), Pulmonary fibrosis ([place]), SVT (supraventricular tachycardia) ([place]), and Thyroid disease.    has a past surgical  history that includes Total abdominal hysterectomy; Tubal ligation; Craniotomy; Abdominal hysterectomy; Hernia repair; Appendectomy; Craniotomy (1980 x 2); Cholecystectomy; and IR IMAGING GUIDED PORT INSERTION (09/15/2020).   EVENTS    09/08/21 - On face mask o2, 7L at 35%. On precedex gt at 0.4 -> HR 60. Higher doses resulted in HR 40s. Borderline comfort v restlessness. REports being uncomfortable but denies pain - this is when she is lucid. Otherweise intermittenly agitated.  Not on pressors. CT chest with large bleb Rt lund inferiourly and small Ptx - apical Right (not visiible on cxr). Has emphhuyema and ILD as well. No aBG    SUBJECTIVE/OVERNIGHT/INTERVAL HX   09/09/21 - 9L fio2, 50% and worse, On precedex gtt - high doses due to agtaion. BP ok   Objective   Blood pressure (!) 122/44, pulse 65, temperature 99 F (37.2 C), temperature source Axillary, resp. rate 19, height 4\' 10"  (1.473 m), weight 67.3 kg, SpO2 (!) 89 %.    FiO2 (%):  [35 %-50 %] 50 %   Intake/Output Summary (Last 24 hours) at 09/09/2021 0926 Last data filed at 09/09/2021 3086 Gross per 24 hour  Intake 302.59 ml  Output 1205 ml  Net -902.41 ml   Filed Weights   09/08/21 0130 09/09/21 0500  Weight: 71.1 kg 67.3 kg   General Appearance:  Looks criticall ill OBESE - + Head:  Normocephalic, without obvious abnormality, atraumatic. ALOPECIA from chemo + Eyes:  PERRL - yes, conjunctiva/corneas - muddy     Ears:  Normal external ear canals, both ears Nose:  G tube - no but has face mask Throat:  ETT TUBE - no , OG tube - no. Face Mask o3. Neck:  Supple,  No enlargement/tenderness/nodules Lungs: Clear to auscultation bilaterally,  Tachypneic. Hypoxemic. Paradoxcial Mild RR 20s Heart:  S1 and S2 normal, no murmur, CVP - no.  Pressors - no Abdomen:  Soft, no masses, no organomegaly Genitalia / Rectal:  Not done Extremities:  Extremities- intact Skin:  ntact in exposed areas . Sacral area - not examind Neurologic:   Sedation - precedex gtt -> RASS - 0 to +1/+2 . Moves all 4s - yes. CAM-ICU - POSITIVE . Orientation - not fully     Resolved Hospital Problem list      Laccitic acidosis - Present on Admit Assessment & Plan:   ASSESSMENT / PLAN: ONCOLOGY  - Extensive stage (T3, N2, M1 C) small cell lung cancer presented with large left lower lobe lung mass in addition to right hilar and mediastinal lymphadenopathy as well as satellite nodule in the left lower lobe and peripheral pleural lesion as well as metastatic liver lesions diagnosed in May 2022.    - subsequent development fo brain mets March 2023  - Second line systemic chemotherapy with carboplatin for AUC of 5 on day 1 and etoposide 100 Mg/M2 on days 1, 2 and 3 with Cosela 240 Mg/M2 before chemotherapy every 3 weeks.  First dose June 12, 2021.  Status post 4 cycles. Last 08/21/21  - ECOG 2-3 as of mid-amay 2023 with frequent falls   - CT Chest 07/27/21 with partial respoinse  - CT head 09/08/21 with parial resoonse   Plan  - oncology might need to weigh in on prognsisis (but seems poor)    COPD with blebs Pulmonary Fibrosis (PF not formally dx) - baseline 4L [place] - Prior to & Present on Admit Acute on chronic resp failure with worsening - Present on Admit Small RT apical ptx - Present on Admit   09/09/2021 -> Worseneed hypoxemia . Concern for worsening ptx.   P:   CXR stat O2 for pulse ox > 88% BiPAP likely contraindicated due to Ptx Chest tube if ptx worsens but there is high risk for permanent BPF given blebs DNI   Opioid allergies NOS - "dilaudid dmentia" - Prior to & Present on Admit Remote hx of craniotomy 1980s - Prior to & Present on Admit SSC with brain mets - march 2023 - Prior to & Present on Admit  -on 2nd line chemo. PArtial respnse on CT June 2023 Dementia - Prior to & Present on Admit - on Namenda Chronic pain - Prior to & Present on Admit -on Tramadol  Acute encephalopathy - Present on Admit  6/3 - confusion with  intermittent agitated encephalopathy continues. Precedex helping  P:   Precedex gtt Start fent prn (no evideence of true opioid allergy - d/w Pharm) Haldol prn Versed prn  Chronic anemia -from chemo and chronic disease - Prior to & Present on Admit  6/4- no bleeding. Hgb 7.4gm%   P:  - PRBC for hgb </= 6.9gm%    - exceptions are   -  if ACS susepcted/confirmed then transfuse for hgb </= 8.0gm%,  or    -  active bleeding with hemodynamic instability, then transfuse regardless of hemoglobin value   At at all times try to transfuse 1 unit prbc as possible with exception of active hemorrhage   Thrombocytopenia - moderate - Present on Admit - due to chemo v sepsis  6/4 -  resolved  P Monitor DVT proph by triad    Vit D def hx -Prior to & Present on Admit     P:   Per triad   Hypomagnesemia - severe 09/09/21 Hypokalemia - severe 09/09/21  Plan    - repletion ongoing  [place] - as of spring 2023. Including beign found down > 8h end May 2023 Poor living condition - [place] Baseline bruises over body SEvere protien calorie malnutritio - Prior to & Present on Admit - admit alb 2.5   Plan  - per triad    Best practice:  Diet: npo Pain/Anxiety/Delirium protocol (if indicated): see neur VAP protocol (if indicated): na DVT prophylaxis: per triad GI prophylaxis: per triad Glucose control: per triad Mobility: bed rest Code Status: DNR at admit  Family Communication: called Son Delcia Spitzley - [phone] ->  8:06am - > he called back 8.35am - did explain to him about poor prognosis. Explained while not actively dying in hours her condition is very likely terminal and comfort care is appropriate. Explained ok to get family in.  And overall prognosis is bad that comfort measure is very appropriate. Advised appropriate ok to get family to visit her 09/08/2021.    Disposition: ICU   PRognsosi - poor. Terminal care appropriate      ATTESTATION  & SIGNATURE   The patient Dawn Gomez is critically ill with multiple organ systems failure and requires high complexity decision making for assessment and support, frequent evaluation and titration of therapies, application of advanced monitoring technologies and extensive interpretation of multiple databases.   Critical Care Time devoted to patient care services described in this note is  35  Minutes. This time reflects time of care of this signee Dr Brand Males. This critical care time does not reflect procedure time, or teaching time or supervisory time of PA/NP/Med student/Med Resident etc but could involve care discussion time     Dr. Brand Males, M.D., [facility].C.P Pulmonary and Critical Care Medicine Medical Director - [facility] ICU Staff Physician, [place] [facility] Pager: [phone], If no answer or between  15:00h - 7:00h: call [phone]  09/09/2021 9:46 AM      LABS    PULMONARY Recent Labs  Lab 09/08/21 0805  PHART 7.58*  PCO2ART 34  PO2ART 42*  HCO3 32.6*  O2SAT 90.5    CBC Recent Labs  Lab 09/25/2021 1913 09/08/21 0430 09/09/21 0555  HGB 9.4* 7.4* 7.4*  HCT 29.3* 22.7* 23.2*  WBC 6.6 4.4 3.7*  PLT 155 140* 155    COAGULATION Recent Labs  Lab 09/29/2021 1913  INR 1.2    CARDIAC  No results for input(s): TROPONINI in the last 168 hours. No results for input(s): PROBNP in the last 168 hours.   CHEMISTRY Recent Labs  Lab 09/06/21 1350 10/05/2021 1913 09/08/21 0430 09/09/21 0555  NA 138 140 137 140  K 3.6 4.2 3.2* 2.6*  CL 98 103 102 99  CO2 36* 26 29 33*  GLUCOSE 109* 198* 105* 80  BUN 10 11 8 9   CREATININE 0.43* 0.68 0.47 0.47  CALCIUM 9.4 9.6 8.7* 8.3*  MG  --   --   --  1.3*  PHOS  --   --   --  3.8   Estimated Creatinine Clearance: 43.3 mL/min (by C-G formula based on SCr of 0.47 mg/dL).   LIVER Recent Labs  Lab  09/06/21 1350 09/21/2021 1913 09/08/21 0430  09/09/21 0555  AST 19 36 35 26  ALT 29 29 28 25   ALKPHOS 45 51 41 44  BILITOT 1.1 1.3* 1.0 0.9  PROT 5.7* 6.4* 4.9* 4.8*  ALBUMIN 3.3* 3.1* 2.5* 2.4*  INR  --  1.2  --   --      INFECTIOUS Recent Labs  Lab 09/09/2021 1913 10/01/2021 2241 09/09/21 0555  LATICACIDVEN 4.9* 1.4 0.6     ENDOCRINE CBG (last 3)  No results for input(s): GLUCAP in the last 72 hours.       IMAGING x48h  - image(s) personally visualized  -   highlighted in bold DG Knee 1-2 Views Right  Result Date: 09/08/2021 CLINICAL DATA:  Fall, right knee pain EXAM: RIGHT KNEE - 1-2 VIEW COMPARISON:  None Available. FINDINGS: No fracture or malalignment visualized. Degenerative osteoarthritis present with narrowing of the joint spaces and productive osteophyte formation. The bones appear demineralized. IMPRESSION: 1. No fracture identified. 2. Tricompartmental degenerative osteoarthritis. Electronically Signed   By: Jacqulynn Cadet M.D.   On: 09/08/2021 08:04   CT HEAD WO CONTRAST (5MM)  Result Date: 09/08/2021 CLINICAL DATA:  Initial evaluation for mental status change, unknown cause. EXAM: CT HEAD WITHOUT CONTRAST TECHNIQUE: Contiguous axial images were obtained from the base of the skull through the vertex without intravenous contrast. RADIATION DOSE REDUCTION: This exam was performed according to the departmental dose-optimization program which includes automated exposure control, adjustment of the mA and/or kV according to patient size and/or use of iterative reconstruction technique. COMPARISON:  CT from 06/17/2021. FINDINGS: Brain: Postoperative changes from prior left-sided craniectomy with cranioplasty. Postoperative encephalomalacia within the underlying left frontotemporal region, stable. Previously seen left parafalcine mass at the left parieto-occipital region is markedly decreased in size now measuring approximately 9 mm. Associated vasogenic edema has markedly improved from prior. Additional noted metastatic  lesion involving the anterior right frontal lobe is now low longer clearly visible by CT. No significant residual edema within this region. No new mass lesion or vasogenic edema. No acute intracranial hemorrhage. No visible large vessel territory infarct. No extra-axial fluid collection. Ventricles normal size without hydrocephalus. Vascular: No hyperdense vessel. Skull: Left pterional craniectomy with cranioplasty. Calvarium otherwise intact. No acute scalp soft tissue abnormality. Sinuses/Orbits: Globes orbital soft tissues demonstrate no acute finding. Scattered mucosal thickening noted within the sphenoid ethmoidal sinuses. No mastoid effusion. Other: None. IMPRESSION: 1. Interval decrease in size of left parafalcine mass, now measuring 9 mm, with markedly improved vasogenic edema. Additional right frontal lobe lesion now no longer clearly visualized. Findings consistent with interval response to therapy. No new lesions identified. 2. No other acute intracranial abnormality. 3. Sequelae of prior left pterional craniectomy with cranioplasty. Electronically Signed   By: Jeannine Boga M.D.   On: 09/08/2021 01:14   CT Angio Chest Pulmonary Embolism (PE) W or WO Contrast  Result Date: 09/08/2021 CLINICAL DATA:  Respiratory distress. EXAM: CT ANGIOGRAPHY CHEST WITH CONTRAST TECHNIQUE: Multidetector CT imaging of the chest was performed using the standard protocol during bolus administration of intravenous contrast. Multiplanar CT image reconstructions and MIPs were obtained to evaluate the vascular anatomy. RADIATION DOSE REDUCTION: This exam was performed according to the departmental dose-optimization program which includes automated exposure control, adjustment of the mA and/or kV according to patient size and/or use of iterative reconstruction technique. CONTRAST:  72mL OMNIPAQUE IOHEXOL 350 MG/ML SOLN COMPARISON:  July 27, 2021 FINDINGS: Cardiovascular: A right-sided venous catheter is in place. There  is  marked severity calcification of the thoracic aorta, without evidence of aortic aneurysm the subsegmental pulmonary arteries are limited in evaluation secondary to overlying areas of artifact and suboptimal opacification with intravenous contrast. No evidence of pulmonary embolism. Normal heart size. No pericardial effusion. Mediastinum/Nodes: No enlarged mediastinal, hilar, or axillary lymph nodes. Thyroid gland, trachea, and esophagus demonstrate no significant findings. Lungs/Pleura: Diffuse interstitial thickening is seen with mild to moderate severity atelectatic changes noted within the bilateral upper lobes and posterior aspect of the bilateral lower lobes. A large, stable, approximately 9.7 cm x 4.0 cm right basilar bleb is seen. There is no evidence of a pleural effusion. A small anterior right-sided pneumothorax is present. This extends from the anterior aspect of the right apex to the right lung base where it measures approximately 2.9 cm in maximum AP measurement. This represents a new finding when compared to the prior exam. Upper Abdomen: No acute abnormality. Musculoskeletal: Multilevel degenerative changes are seen throughout the thoracic spine. Other: It should be noted that the study is limited secondary to patient motion. Review of the MIP images confirms the above findings. IMPRESSION: 1. Small anterior right-sided pneumothorax. 2. Diffuse interstitial thickening with mild to moderate severity bilateral upper lobe and bibasilar atelectasis. 3. Marked severity emphysematous lung disease with a large, stable right basilar bleb. Aortic Atherosclerosis (ICD10-I70.0). Electronically Signed   By: Virgina Norfolk M.D.   On: 09/08/2021 00:28   DG CHEST PORT 1 VIEW  Result Date: 09/08/2021 CLINICAL DATA:  Follow-up right pneumothorax. EXAM: PORTABLE CHEST 1 VIEW COMPARISON:  Chest x-rays from the last 2 days. FINDINGS: An 8 mm pneumothorax is identified in the right upper lobe. The measurement was  made laterally. This compares to a 5 mm pneumothorax on yesterday's study. Small bilateral pleural effusions with underlying atelectasis. Atelectasis in the bases. Stable cardiomediastinal silhouette with cardiomegaly. Stable right Port-A-Cath. IMPRESSION: 1. The right-sided pneumothorax is a little larger in the interval measuring 8 mm today versus 5 mm previously. 2. Small bilateral pleural effusions with associated atelectasis, more pronounced in the interval. 3. No other acute interval changes. These results will be called to the ordering clinician or representative by the Radiologist Assistant, and communication documented in the PACS or [employer]. Electronically Signed   By: Dorise Bullion III M.D.   On: 09/08/2021 15:09   DG CHEST PORT 1 VIEW  Result Date: 09/08/2021 CLINICAL DATA:  Pneumothorax EXAM: PORTABLE CHEST 1 VIEW COMPARISON:  Chest x-ray and CT scan of the chest obtained yesterday FINDINGS: Stable to slightly decreased trace right apical pneumothorax. Right IJ power injectable port catheter remains in unchanged position with the tip overlying the cavoatrial junction. Advanced bilateral interstitial airspace opacities consistent with pulmonary fibrosis. Linear atelectasis in the right middle lobe. Stable cardiomegaly. Atherosclerotic calcifications present in the transverse aorta. IMPRESSION: Stable to slightly decreased tiny right apical pneumothorax. Lower lung volumes with new right middle lobe atelectasis. Electronically Signed   By: Jacqulynn Cadet M.D.   On: 09/08/2021 08:03   DG Chest Port 1 View  Result Date: 09/16/2021 CLINICAL DATA:  Respiratory distress EXAM: PORTABLE CHEST 1 VIEW COMPARISON:  Chest CT 07/27/2021, radiograph 02/05/2017 FINDINGS: Right-sided central venous port tip over the cavoatrial region. Enlarged cardiomediastinal silhouette with mild vascular congestion. Underlying emphysema and chronic interstitial lung disease. Streaky bibasilar opacities may be due  to atelectasis or scar. Known left lower lobe lung mass is better seen on CT. Aortic atherosclerosis IMPRESSION: 1. Cardiomegaly with suspected slight vascular congestion. 2. Underlying emphysema  and chronic lung disease. Scarring or atelectasis at the bases. Known left lower lobe lung mass is better seen on CT Electronically Signed   By: Donavan Foil M.D.   On: 09/09/2021 19:36   ECHOCARDIOGRAM COMPLETE  Result Date: 09/08/2021    ECHOCARDIOGRAM REPORT   Patient Name:   CHRYS LANDGREBE Tangeman Date of Exam: 09/08/2021 Medical Rec #:  [id number]           Height:       58.0 in Accession #:    [id number]          Weight:       156.7 lb Date of Birth:  [birth year]          BSA:          1.642 m Patient Age:    77 years            BP:           142/67 mmHg Patient Gender: F                   HR:           61 bpm. Exam Location:  Inpatient Procedure: 2D Echo, Cardiac Doppler and Color Doppler Indications:    Elevated troponin, respiratory distress, cardiomegaly  History:        Patient has no prior history of Echocardiogram examinations.                 Risk Factors:Hypertension and Sleep Apnea.  Sonographer:    Jyl Heinz Referring Phys: [place]  1. Left ventricular ejection fraction, by estimation, is 60 to 65%. The left ventricle has normal function. The left ventricle has no regional wall motion abnormalities. Left ventricular diastolic parameters are consistent with Grade I diastolic dysfunction (impaired relaxation).  2. Right ventricular systolic function is normal. The right ventricular size is normal.  3. Left atrial size was mildly dilated.  4. Trivial mitral valve regurgitation.  5. Aortic valve regurgitation is not visualized.  6. The inferior vena cava is dilated in size with >50% respiratory variability, suggesting right atrial pressure of 8 mmHg. Comparison(s): No prior Echocardiogram. FINDINGS  Left Ventricle: Left ventricular ejection fraction, by estimation, is 60 to 65%. The left  ventricle has normal function. The left ventricle has no regional wall motion abnormalities. The left ventricular internal cavity size was normal in size. There is  no left ventricular hypertrophy. Left ventricular diastolic parameters are consistent with Grade I diastolic dysfunction (impaired relaxation). Right Ventricle: The right ventricular size is normal. Right ventricular systolic function is normal. Left Atrium: Left atrial size was mildly dilated. Right Atrium: Right atrial size was normal in size. Pericardium: There is no evidence of pericardial effusion. Mitral Valve: Trivial mitral valve regurgitation. Tricuspid Valve: Tricuspid valve regurgitation is mild. Aortic Valve: Aortic valve regurgitation is not visualized. Aortic valve peak gradient measures 10.6 mmHg. Pulmonic Valve: Pulmonic valve regurgitation is not visualized. Venous: The inferior vena cava is dilated in size with greater than 50% respiratory variability, suggesting right atrial pressure of 8 mmHg. IAS/Shunts: No atrial level shunt detected by color flow Doppler.  LEFT VENTRICLE PLAX 2D LVIDd:         3.90 cm     Diastology LVIDs:         2.80 cm     LV e' medial:    4.79 cm/s LV PW:         1.00 cm  LV E/e' medial:  18.2 LV IVS:        1.10 cm     LV e' lateral:   5.87 cm/s LVOT diam:     2.00 cm     LV E/e' lateral: 14.8 LV SV:         61 LV SV Index:   37 LVOT Area:     3.14 cm  LV Volumes (MOD) LV vol d, MOD A2C: 73.7 ml LV vol d, MOD A4C: 67.1 ml LV vol s, MOD A2C: 27.7 ml LV vol s, MOD A4C: 25.5 ml LV SV MOD A2C:     46.0 ml LV SV MOD A4C:     67.1 ml LV SV MOD BP:      43.1 ml RIGHT VENTRICLE             IVC RV Basal diam:  3.70 cm     IVC diam: 2.20 cm RV Mid diam:    3.20 cm RV S prime:     11.40 cm/s TAPSE (M-mode): 2.1 cm LEFT ATRIUM             Index        RIGHT ATRIUM           Index LA diam:        3.10 cm 1.89 cm/m   RA Area:     15.70 cm LA Vol (A2C):   61.5 ml 37.46 ml/m  RA Volume:   44.20 ml  26.92 ml/m LA Vol  (A4C):   45.4 ml 27.65 ml/m LA Biplane Vol: 52.7 ml 32.10 ml/m  AORTIC VALVE AV Area (Vmax): 1.52 cm AV Vmax:        163.00 cm/s AV Peak Grad:   10.6 mmHg LVOT Vmax:      79.00 cm/s LVOT Vmean:     55.700 cm/s LVOT VTI:       0.195 m  AORTA Ao Root diam: 3.30 cm Ao Asc diam:  2.60 cm MITRAL VALVE               TRICUSPID VALVE MV Area (PHT): 3.05 cm    TR Peak grad:   35.8 mmHg MV Decel Time: 249 msec    TR Vmax:        299.00 cm/s MV E velocity: 87.00 cm/s MV A velocity: 94.70 cm/s  SHUNTS MV E/A ratio:  0.92        Systemic VTI:  0.20 m                            Systemic Diam: 2.00 cm Phineas Inches Electronically signed by Phineas Inches Signature Date/Time: 09/08/2021/12:43:45 PM    Final

## 2021-09-09 NOTE — Progress Notes (Signed)
Latest CXR - the rtight apical ptx is small stable. Too risky to put chest tube. Worsneing hypoxemia Is due to atelectasis.   No indication for chest tube right now. Continue to monitor. Overall comfort care probably best direction     SIGNATURE    Dr. Brand Males, M.D., F.C.C.P,  Pulmonary and Critical Care Medicine Staff Physician, [place] Director - Interstitial Lung Disease  Program  Medical Director - [facility] [place], [place], [address]  NPI Number:  NPI #[id number] [facility] Number: [id number]  Pager: [phone], If no answer  -[place] or [facility] [place] Telephone (clinical office): [phone] Telephone (research): [phone]  11:49 AM 09/09/2021     DG Knee 1-2 Views Right  Result Date: 09/08/2021 CLINICAL DATA:  Fall, right knee pain EXAM: RIGHT KNEE - 1-2 VIEW COMPARISON:  None Available. FINDINGS: No fracture or malalignment visualized. Degenerative osteoarthritis present with narrowing of the joint spaces and productive osteophyte formation. The bones appear demineralized. IMPRESSION: 1. No fracture identified. 2. Tricompartmental degenerative osteoarthritis. Electronically Signed   By: Jacqulynn Cadet M.D.   On: 09/08/2021 08:04   CT HEAD WO CONTRAST (5MM)  Result Date: 09/08/2021 CLINICAL DATA:  Initial evaluation for mental status change, unknown cause. EXAM: CT HEAD WITHOUT CONTRAST TECHNIQUE: Contiguous axial images were obtained from the base of the skull through the vertex without intravenous contrast. RADIATION DOSE REDUCTION: This exam was performed according to the departmental dose-optimization program which includes automated exposure control, adjustment of the mA and/or kV according to patient size and/or use of iterative reconstruction technique. COMPARISON:  CT from 06/17/2021. FINDINGS: Brain: Postoperative changes from prior left-sided craniectomy with  cranioplasty. Postoperative encephalomalacia within the underlying left frontotemporal region, stable. Previously seen left parafalcine mass at the left parieto-occipital region is markedly decreased in size now measuring approximately 9 mm. Associated vasogenic edema has markedly improved from prior. Additional noted metastatic lesion involving the anterior right frontal lobe is now low longer clearly visible by CT. No significant residual edema within this region. No new mass lesion or vasogenic edema. No acute intracranial hemorrhage. No visible large vessel territory infarct. No extra-axial fluid collection. Ventricles normal size without hydrocephalus. Vascular: No hyperdense vessel. Skull: Left pterional craniectomy with cranioplasty. Calvarium otherwise intact. No acute scalp soft tissue abnormality. Sinuses/Orbits: Globes orbital soft tissues demonstrate no acute finding. Scattered mucosal thickening noted within the sphenoid ethmoidal sinuses. No mastoid effusion. Other: None. IMPRESSION: 1. Interval decrease in size of left parafalcine mass, now measuring 9 mm, with markedly improved vasogenic edema. Additional right frontal lobe lesion now no longer clearly visualized. Findings consistent with interval response to therapy. No new lesions identified. 2. No other acute intracranial abnormality. 3. Sequelae of prior left pterional craniectomy with cranioplasty. Electronically Signed   By: Jeannine Boga M.D.   On: 09/08/2021 01:14   CT Angio Chest Pulmonary Embolism (PE) W or WO Contrast  Result Date: 09/08/2021 CLINICAL DATA:  Respiratory distress. EXAM: CT ANGIOGRAPHY CHEST WITH CONTRAST TECHNIQUE: Multidetector CT imaging of the chest was performed using the standard protocol during bolus administration of intravenous contrast. Multiplanar CT image reconstructions and MIPs were obtained to evaluate the vascular anatomy. RADIATION DOSE REDUCTION: This exam was performed according to the  departmental dose-optimization program which includes automated exposure control, adjustment of the mA and/or kV according to patient size and/or use of iterative reconstruction technique. CONTRAST:  42mL  OMNIPAQUE IOHEXOL 350 MG/ML SOLN COMPARISON:  July 27, 2021 FINDINGS: Cardiovascular: A right-sided venous catheter is in place. There is marked severity calcification of the thoracic aorta, without evidence of aortic aneurysm the subsegmental pulmonary arteries are limited in evaluation secondary to overlying areas of artifact and suboptimal opacification with intravenous contrast. No evidence of pulmonary embolism. Normal heart size. No pericardial effusion. Mediastinum/Nodes: No enlarged mediastinal, hilar, or axillary lymph nodes. Thyroid gland, trachea, and esophagus demonstrate no significant findings. Lungs/Pleura: Diffuse interstitial thickening is seen with mild to moderate severity atelectatic changes noted within the bilateral upper lobes and posterior aspect of the bilateral lower lobes. A large, stable, approximately 9.7 cm x 4.0 cm right basilar bleb is seen. There is no evidence of a pleural effusion. A small anterior right-sided pneumothorax is present. This extends from the anterior aspect of the right apex to the right lung base where it measures approximately 2.9 cm in maximum AP measurement. This represents a new finding when compared to the prior exam. Upper Abdomen: No acute abnormality. Musculoskeletal: Multilevel degenerative changes are seen throughout the thoracic spine. Other: It should be noted that the study is limited secondary to patient motion. Review of the MIP images confirms the above findings. IMPRESSION: 1. Small anterior right-sided pneumothorax. 2. Diffuse interstitial thickening with mild to moderate severity bilateral upper lobe and bibasilar atelectasis. 3. Marked severity emphysematous lung disease with a large, stable right basilar bleb. Aortic Atherosclerosis  (ICD10-I70.0). Electronically Signed   By: Virgina Norfolk M.D.   On: 09/08/2021 00:28   DG CHEST PORT 1 VIEW  Result Date: 09/09/2021 CLINICAL DATA:  Pneumothorax EXAM: PORTABLE CHEST 1 VIEW COMPARISON:  09/08/2021 FINDINGS: Stable positioning of a right-sided chest port. Cardiomegaly. Aortic atherosclerosis. Stable small right apical pneumothorax. Increasing right basilar airspace opacity. Small bilateral pleural effusions. No left-sided pneumothorax. IMPRESSION: 1. Stable small right apical pneumothorax. 2. Increasing right basilar airspace opacity. 3. Small bilateral pleural effusions. Electronically Signed   By: Davina Poke D.O.   On: 09/09/2021 11:41   DG CHEST PORT 1 VIEW  Result Date: 09/08/2021 CLINICAL DATA:  Follow-up right pneumothorax. EXAM: PORTABLE CHEST 1 VIEW COMPARISON:  Chest x-rays from the last 2 days. FINDINGS: An 8 mm pneumothorax is identified in the right upper lobe. The measurement was made laterally. This compares to a 5 mm pneumothorax on yesterday's study. Small bilateral pleural effusions with underlying atelectasis. Atelectasis in the bases. Stable cardiomediastinal silhouette with cardiomegaly. Stable right Port-A-Cath. IMPRESSION: 1. The right-sided pneumothorax is a little larger in the interval measuring 8 mm today versus 5 mm previously. 2. Small bilateral pleural effusions with associated atelectasis, more pronounced in the interval. 3. No other acute interval changes. These results will be called to the ordering clinician or representative by the Radiologist Assistant, and communication documented in the PACS or [employer]. Electronically Signed   By: Dorise Bullion III M.D.   On: 09/08/2021 15:09   DG CHEST PORT 1 VIEW  Result Date: 09/08/2021 CLINICAL DATA:  Pneumothorax EXAM: PORTABLE CHEST 1 VIEW COMPARISON:  Chest x-ray and CT scan of the chest obtained yesterday FINDINGS: Stable to slightly decreased trace right apical pneumothorax. Right IJ power  injectable port catheter remains in unchanged position with the tip overlying the cavoatrial junction. Advanced bilateral interstitial airspace opacities consistent with pulmonary fibrosis. Linear atelectasis in the right middle lobe. Stable cardiomegaly. Atherosclerotic calcifications present in the transverse aorta. IMPRESSION: Stable to slightly decreased tiny right apical pneumothorax. Lower lung volumes with new  right middle lobe atelectasis. Electronically Signed   By: Jacqulynn Cadet M.D.   On: 09/08/2021 08:03   DG Chest Port 1 View  Result Date: 10/04/2021 CLINICAL DATA:  Respiratory distress EXAM: PORTABLE CHEST 1 VIEW COMPARISON:  Chest CT 07/27/2021, radiograph 02/05/2017 FINDINGS: Right-sided central venous port tip over the cavoatrial region. Enlarged cardiomediastinal silhouette with mild vascular congestion. Underlying emphysema and chronic interstitial lung disease. Streaky bibasilar opacities may be due to atelectasis or scar. Known left lower lobe lung mass is better seen on CT. Aortic atherosclerosis IMPRESSION: 1. Cardiomegaly with suspected slight vascular congestion. 2. Underlying emphysema and chronic lung disease. Scarring or atelectasis at the bases. Known left lower lobe lung mass is better seen on CT Electronically Signed   By: Donavan Foil M.D.   On: 09/19/2021 19:36   ECHOCARDIOGRAM COMPLETE  Result Date: 09/08/2021    ECHOCARDIOGRAM REPORT   Patient Name:   Dawn Gomez Kibble Date of Exam: 09/08/2021 Medical Rec #:  [id number]           Height:       58.0 in Accession #:    [id number]          Weight:       156.7 lb Date of Birth:  17-Apr-1935          BSA:          1.642 m Patient Age:    86 years            BP:           142/67 mmHg Patient Gender: F                   HR:           61 bpm. Exam Location:  Inpatient Procedure: 2D Echo, Cardiac Doppler and Color Doppler Indications:    Elevated troponin, respiratory distress, cardiomegaly  History:        Patient has no prior  history of Echocardiogram examinations.                 Risk Factors:Hypertension and Sleep Apnea.  Sonographer:    Jyl Heinz Referring Phys: [place]  1. Left ventricular ejection fraction, by estimation, is 60 to 65%. The left ventricle has normal function. The left ventricle has no regional wall motion abnormalities. Left ventricular diastolic parameters are consistent with Grade I diastolic dysfunction (impaired relaxation).  2. Right ventricular systolic function is normal. The right ventricular size is normal.  3. Left atrial size was mildly dilated.  4. Trivial mitral valve regurgitation.  5. Aortic valve regurgitation is not visualized.  6. The inferior vena cava is dilated in size with >50% respiratory variability, suggesting right atrial pressure of 8 mmHg. Comparison(s): No prior Echocardiogram. FINDINGS  Left Ventricle: Left ventricular ejection fraction, by estimation, is 60 to 65%. The left ventricle has normal function. The left ventricle has no regional wall motion abnormalities. The left ventricular internal cavity size was normal in size. There is  no left ventricular hypertrophy. Left ventricular diastolic parameters are consistent with Grade I diastolic dysfunction (impaired relaxation). Right Ventricle: The right ventricular size is normal. Right ventricular systolic function is normal. Left Atrium: Left atrial size was mildly dilated. Right Atrium: Right atrial size was normal in size. Pericardium: There is no evidence of pericardial effusion. Mitral Valve: Trivial mitral valve regurgitation. Tricuspid Valve: Tricuspid valve regurgitation is mild. Aortic Valve: Aortic valve regurgitation is not visualized. Aortic valve  peak gradient measures 10.6 mmHg. Pulmonic Valve: Pulmonic valve regurgitation is not visualized. Venous: The inferior vena cava is dilated in size with greater than 50% respiratory variability, suggesting right atrial pressure of 8 mmHg. IAS/Shunts: No  atrial level shunt detected by color flow Doppler.  LEFT VENTRICLE PLAX 2D LVIDd:         3.90 cm     Diastology LVIDs:         2.80 cm     LV e' medial:    4.79 cm/s LV PW:         1.00 cm     LV E/e' medial:  18.2 LV IVS:        1.10 cm     LV e' lateral:   5.87 cm/s LVOT diam:     2.00 cm     LV E/e' lateral: 14.8 LV SV:         61 LV SV Index:   37 LVOT Area:     3.14 cm  LV Volumes (MOD) LV vol d, MOD A2C: 73.7 ml LV vol d, MOD A4C: 67.1 ml LV vol s, MOD A2C: 27.7 ml LV vol s, MOD A4C: 25.5 ml LV SV MOD A2C:     46.0 ml LV SV MOD A4C:     67.1 ml LV SV MOD BP:      43.1 ml RIGHT VENTRICLE             IVC RV Basal diam:  3.70 cm     IVC diam: 2.20 cm RV Mid diam:    3.20 cm RV S prime:     11.40 cm/s TAPSE (M-mode): 2.1 cm LEFT ATRIUM             Index        RIGHT ATRIUM           Index LA diam:        3.10 cm 1.89 cm/m   RA Area:     15.70 cm LA Vol (A2C):   61.5 ml 37.46 ml/m  RA Volume:   44.20 ml  26.92 ml/m LA Vol (A4C):   45.4 ml 27.65 ml/m LA Biplane Vol: 52.7 ml 32.10 ml/m  AORTIC VALVE AV Area (Vmax): 1.52 cm AV Vmax:        163.00 cm/s AV Peak Grad:   10.6 mmHg LVOT Vmax:      79.00 cm/s LVOT Vmean:     55.700 cm/s LVOT VTI:       0.195 m  AORTA Ao Root diam: 3.30 cm Ao Asc diam:  2.60 cm MITRAL VALVE               TRICUSPID VALVE MV Area (PHT): 3.05 cm    TR Peak grad:   35.8 mmHg MV Decel Time: 249 msec    TR Vmax:        299.00 cm/s MV E velocity: 87.00 cm/s MV A velocity: 94.70 cm/s  SHUNTS MV E/A ratio:  0.92        Systemic VTI:  0.20 m                            Systemic Diam: 2.00 cm Phineas Inches Electronically signed by Phineas Inches Signature Date/Time: 09/08/2021/12:43:45 PM    Final

## 2021-09-09 NOTE — Progress Notes (Signed)
PROGRESS NOTE   Dawn Gomez  WLN:[id number]    DOB: 09/27/1935    DOA: 09/28/2021  PCP: Biagio Borg, MD   I have briefly reviewed patients previous medical records in [facility].  Chief Complaint  Patient presents with   Respiratory Distress    Brief Narrative:  86 year old married female with PMH of extensive stage SCLC with initial presentation May 2022 with large LLL lung mass, right hilar and mediastinal adenopathy, satellite lung lesions, metastatic liver disease, completed first-line systemic chemotherapy, subsequent observation for 6 months, followed by disease progression with lung lesions, enlarging liver mets and brain mets, s/p whole brain radiation, now completed cycle #4 of second line systemic chemotherapy, other history including COPD, OSA on CPAP, chronic respiratory failure with hypoxia on 4 L/min home oxygen, HLD, hypothyroidism, depression, memory/cognitive impairment, falls at home, presented to ED via EMS on 10/01/2021 from home after being found down and in respiratory distress.  She was reported to have appeared gray and was hypoxic to 50% on home oxygen 4 L/min.  Admitted to stepdown for acute on chronic respiratory failure with hypoxia, multifactorial, lactic acidosis, acute encephalopathy.  CCM consulted.  Agree that overall very poor prognosis.  Palliative care on board, son awaiting extended family to visit patient and then palliative care team to meet again with them today to further address goals of care.   Assessment & Plan:  Principal Problem:   Acute on chronic respiratory failure with hypoxia (HCC) Active Problems:   Interstitial lung disease (HCC)   OSA (obstructive sleep apnea)   Hyperlipidemia   SVT (supraventricular tachycardia) (HCC)   Depression   Hypothyroidism   Psoriasis   Emphysema lung (HCC)   Small cell carcinoma of lower lobe of left lung (HCC)   Malignant neoplasm metastatic to brain (HCC)   Anemia due to antineoplastic  chemotherapy   Hypoalbuminemia   Pyuria   Lactic acidosis   Acute encephalopathy   Demand ischemia (HCC)   Volume overload   Pneumothorax   HTN (hypertension)   Frequent falls   Acute on chronic hypoxic respiratory failure:  Likely multifactorial due to COPD, emphysema with blebs, OSA, lung cancer, pulmonary fibrosis, small pneumothorax,?  Pneumonia.  Clinically does not appear volume overloaded but BNP elevated.  Currently on Ventimask at 7 L/min, FiO2 30%.  Sedated for confusion and agitation.  CCM input appreciated, discussed with Dr. Chase Caller.  BiPAP contraindicated due to PTX.  Chest tube if PTX worsens but high risk for permanent bronchopleural fistula given blebs.  Chest x-ray 6/3: Stable to slightly decreased tiny right apical PTX.  Dr. Chase Caller recommends terminal comfort care and I agree.  Consulted palliative care medicine.  Will attempt to reach out oncology to keep them in the loop.  MRSA PCR negative, discontinue vancomycin, continue cefepime.  S/p IV Lasix 30 mg mg x 1 on 6/3.  CTA chest as below appreciated.  COVID-19 RT-PCR negative.  Palliative team consultation 6/3 appreciated, plan to meet with family again today to advance goals of care discussions.  Son was waiting for extended family to come meet with patient.  Has worsened: Increased oxygen requirement, up to Ventimask, 9 L/min, 50% FiO2, increased restlessness and Precedex increased 2.3 with ongoing as needed fentanyl, chest x-ray with slightly worsened pneumothorax.  Most appropriate would be transitioning to full comfort care.  As per discussion with RN, son explored about residential hospice yesterday, would not be able to manage care at home.  I communicated with oncologist  on-call on 6/3 and he agreed that hospice transition would be most appropriate.   Acute metabolic encephalopathy: Acute AMS complicating underlying chronic cognitive impairment.  Again likely multifactorial related to hypoxia, meds,?  Infection and  lactic acidosis complicating underlying brain mets.  Overnight agitation, currently on low-dose Precedex drip and bilateral hand mittens.  CT head actually shows improvement in brain mets, responding to therapy.  Delirium precautions.  ABG without CO2 retention.  TSH normal.  Now on increased dose of Precedex and getting as needed fentanyl.  This is helping.  Blood cultures x2: Negative to date.  Urine culture shows >100 K colonies of gram-negative rods.  Already on IV cefepime.  Elevated troponin: Likely due to demand ischemia from acute respiratory failure with hypoxia and other critical illness noted above.  Decreasing trend.  No angina reported.  Will not be a candidate for any aggressive interventions.  TTE with preserved LVEF and grade 1 diastolic dysfunction.  Lactic acidosis: Initial lactate of 4.9 which has since normalized.  Secondary to acute respiratory failure.  IV fluids appropriately discontinued.  Resolved.  Acute on chronic anemia: Hemoglobin dropped from 9.4 on admission to 7.4.  No overt bleeding reported.  Suspect due to recent cancer chemotherapy and current hemodilution.  S/p 2 units PRBC on 5/25.  Follow CBC in a.m. and consider PRBC transfusion if hemoglobin drops to 7 or less if aggressive care is considered by family.  Hemoglobin stable at 7.4  Thrombocytopenia: Possibly due to acute illness, antibiotics, infectious etiology.  Resolved.  Hypokalemia: Despite replacing, K2.6, secondary to IV Lasix yesterday, replacing IV pending palliative care follow-up  Hypomagnesemia: Magnesium 1.3, replace IV and follow.   SCLC metastatic to liver and brain:  Dx May 2022, followed by Dr. Earlie Server. 1st line chemo Jun-Aug 2022, whole brain irradiation by Dr. Lisbeth Renshaw March 2023. 2nd line chemotherapy started March 2023, ongoing and completed fourth cycle mid May.  Follows with Dr. Curt Bears. Palliative care consulted and communicated with Dr. Lorenso Courier 6/3.   OSA:  - Can use CPAP  overnight   Frequent falls: Pt will need 24 hour supervision.  - PT/OT/TOC consult provided she is not transitioned to full comfort care and improves to be evaluated.    Depression:  -Hold home SSRI given AMS.   Hypothyroidism:  -TSH normal.   Hypoalbuminemia: Noted   HTN, SVT:  -Hold antihypertensives, unable to give p.o. due to AMS.  Unsafe.   HLD:  - Hold statin for now  Body mass index is 31.01 kg/m.   DVT prophylaxis: enoxaparin (LOVENOX) injection 40 mg Start: 09/08/21 1000     Code Status: DNR:  Family Communication: Unable to reach son 6/3, left VM message.  No family at bedside. Disposition:  Status is: Inpatient Remains inpatient appropriate because: Critically ill     Consultants:   PCCM Palliative care medicine  Procedures:   Ventimask  Antimicrobials:   IV cefepime IV vancomycin discontinued.   Subjective:  Briefly opens eyes to call or touch, "I am okay" and keeps repeating the same to all questions.  When asked about pain, does not respond.  As per RN, due to restlessness, Precedex drip increased and getting frequent as needed fentanyl.  This has helped and she has been calm.  Objective:   Vitals:   09/09/21 0800 09/09/21 0829 09/09/21 0900 09/09/21 1000  BP: (!) 117/44 (!) 122/44 (!) 112/35 (!) 126/51  Pulse: 61 65 60 60  Resp: 14 19 12 16   Temp: 99 F (  37.2 C)     TempSrc: Axillary     SpO2: 93% (!) 89% 91% 91%  Weight:      Height:        General exam: Elderly female, small built and chronically ill looking lying slightly propped up in bed with no obvious distress.  Has a fullface Ventimask at 9 L/min, FiO2 50 %, saturating in the mid 90s.  Generalized muscle wasting, bitemporal wasting, diminished subcutaneous fat. Respiratory system: Harsh breath sounds bilaterally.  No increased work of breathing.  No wheezing, rhonchi or crackles appreciated. Cardiovascular system: S1 & S2 heard, RRR. No JVD, murmurs, rubs, gallops or clicks. No  pedal edema.  Telemetry personally reviewed: Sinus rhythm-SB in the 50s with occasional PVCs. Gastrointestinal system: Abdomen is nondistended, soft and nontender. No organomegaly or masses felt. Normal bowel sounds heard. Central nervous system: Mental status as noted above. No focal neurological deficits. Extremities: Moving all extremities.  Bilateral hand mittens Skin: No rashes, lesions or ulcers Psychiatry: Judgement and insight impaired. Mood & affect cannot be assessed.     Data Reviewed:   I have personally reviewed following labs and imaging studies   CBC: Recent Labs  Lab 09/06/21 1350 09/06/2021 1913 09/08/21 0430 09/09/21 0555  WBC 2.8* 6.6 4.4 3.7*  NEUTROABS 1.6* 4.9 3.0  --   HGB 8.3* 9.4* 7.4* 7.4*  HCT 25.4* 29.3* 22.7* 23.2*  MCV 91.0 94.2 94.2 94.7  PLT 92* 155 140* 409    Basic Metabolic Panel: Recent Labs  Lab 09/06/21 1350 10/02/2021 1913 09/08/21 0430 09/09/21 0555  NA 138 140 137 140  K 3.6 4.2 3.2* 2.6*  CL 98 103 102 99  CO2 36* 26 29 33*  GLUCOSE 109* 198* 105* 80  BUN 10 11 8 9   CREATININE 0.43* 0.68 0.47 0.47  CALCIUM 9.4 9.6 8.7* 8.3*  MG  --   --   --  1.3*  PHOS  --   --   --  3.8    Liver Function Tests: Recent Labs  Lab 09/06/21 1350 09/09/2021 1913 09/08/21 0430 09/09/21 0555  AST 19 36 35 26  ALT 29 29 28 25   ALKPHOS 45 51 41 44  BILITOT 1.1 1.3* 1.0 0.9  PROT 5.7* 6.4* 4.9* 4.8*  ALBUMIN 3.3* 3.1* 2.5* 2.4*    CBG: No results for input(s): GLUCAP in the last 168 hours.  Microbiology Studies:   Recent Results (from the past 240 hour(s))  Culture, blood (Routine x 2)     Status: None (Preliminary result)   Collection Time: 10/03/2021  7:11 PM   Specimen: BLOOD  Result Value Ref Range Status   Specimen Description   Final    BLOOD BLOOD LEFT FOREARM Performed at [facility] [address], [place], [place] [address]    Special Requests   Final    BOTTLES DRAWN AEROBIC AND ANAEROBIC Blood  Culture adequate volume Performed at [facility] [address] [place], [place] [address]    Culture   Final    NO GROWTH 2 DAYS Performed at [facility] [address], [place], [place] [address]    Report Status PENDING  Incomplete  Urine Culture     Status: Abnormal (Preliminary result)   Collection Time: 09/20/2021  7:55 PM   Specimen: In/Out Cath Urine  Result Value Ref Range Status   Specimen Description   Final    IN/OUT CATH URINE Performed at [facility], [address] [place], [place] [address]    Special Requests   Final    NONE Performed at [facility], [place] [address] [place], [place] [address]    Culture (A)  Final    >=100,000 COLONIES/mL GRAM NEGATIVE RODS SUSCEPTIBILITIES TO FOLLOW Performed at [facility], [place] [address], [place], [place] [address]    Report Status PENDING  Incomplete  Culture, blood (Routine x 2)     Status: None (Preliminary result)   Collection Time: 09/25/2021  8:50 PM   Specimen: BLOOD  Result Value Ref Range Status   Specimen Description   Final    BLOOD PORTA CATH Performed at [facility] [address], [place], [place] [address]    Special Requests   Final    BOTTLES DRAWN AEROBIC AND ANAEROBIC Blood Culture results may not be optimal due to an excessive volume of blood received in culture bottles Performed at [facility] [address], [place], [place] [address]    Culture   Final    NO GROWTH 2 DAYS Performed at [facility] [address] [place], [place] [address]    Report Status PENDING  Incomplete  MRSA Next Gen by PCR, Nasal     Status: None   Collection Time: 09/08/21  1:12 AM   Specimen: Nasal Mucosa; Nasal Swab  Result Value Ref Range Status   MRSA by PCR Next Gen NOT DETECTED NOT DETECTED Final    Comment: (NOTE) The GeneXpert MRSA Assay (FDA approved for NASAL specimens only), is one  component of a comprehensive MRSA colonization surveillance program. It is not intended to diagnose MRSA infection nor to guide or monitor treatment for MRSA infections. Test performance is not FDA approved in patients less than 78 years old. Performed at [facility], [place] [address], [place], [place] [address]   SARS Coronavirus 2 by RT PCR (hospital order, performed in [facility] hospital lab) *cepheid single result test* Anterior Nasal Swab     Status: None   Collection Time: 09/08/21  4:30 AM   Specimen: Anterior Nasal Swab  Result Value Ref Range Status   SARS Coronavirus 2 by RT PCR NEGATIVE NEGATIVE Final    Comment: (NOTE) SARS-CoV-2 target nucleic acids are NOT DETECTED.  The SARS-CoV-2 RNA is generally detectable in upper and lower respiratory specimens during the acute phase of infection. The lowest concentration of SARS-CoV-2 viral copies this assay can detect is 250 copies / mL. A negative result does not preclude SARS-CoV-2 infection and should not be used as the sole basis for treatment or other patient management decisions.  A negative result may occur with improper specimen collection / handling, submission of specimen other than nasopharyngeal swab, presence of viral mutation(s) within the areas targeted by this assay, and inadequate number of viral copies (<250 copies / mL). A negative result must be combined with clinical observations, patient history, and epidemiological information.  Fact Sheet for Patients:   https://www.patel.info/  Fact Sheet for Healthcare Providers: https://hall.com/  This test is not yet approved or  cleared by the [place] FDA and has been authorized for detection and/or diagnosis of SARS-CoV-2 by FDA under an Emergency Use Authorization (EUA).  This EUA will remain in effect (meaning this test can be used) for the duration of the COVID-19 declaration under Section  564(b)(1) of the Act, 21 U.S.C. section 360bbb-3(b)(1), unless the authorization is terminated or revoked sooner.  Performed at  [facility], [place] [address], [place], [place] [address]     Radiology Studies:  DG Knee 1-2 Views Right  Result Date: 09/08/2021 CLINICAL DATA:  Fall, right knee pain EXAM: RIGHT KNEE - 1-2 VIEW COMPARISON:  None Available. FINDINGS: No fracture or malalignment visualized. Degenerative osteoarthritis present with narrowing of the joint spaces and productive osteophyte formation. The bones appear demineralized. IMPRESSION: 1. No fracture identified. 2. Tricompartmental degenerative osteoarthritis. Electronically Signed   By: Jacqulynn Cadet M.D.   On: 09/08/2021 08:04   CT HEAD WO CONTRAST (5MM)  Result Date: 09/08/2021 CLINICAL DATA:  Initial evaluation for mental status change, unknown cause. EXAM: CT HEAD WITHOUT CONTRAST TECHNIQUE: Contiguous axial images were obtained from the base of the skull through the vertex without intravenous contrast. RADIATION DOSE REDUCTION: This exam was performed according to the departmental dose-optimization program which includes automated exposure control, adjustment of the mA and/or kV according to patient size and/or use of iterative reconstruction technique. COMPARISON:  CT from 06/17/2021. FINDINGS: Brain: Postoperative changes from prior left-sided craniectomy with cranioplasty. Postoperative encephalomalacia within the underlying left frontotemporal region, stable. Previously seen left parafalcine mass at the left parieto-occipital region is markedly decreased in size now measuring approximately 9 mm. Associated vasogenic edema has markedly improved from prior. Additional noted metastatic lesion involving the anterior right frontal lobe is now low longer clearly visible by CT. No significant residual edema within this region. No new mass lesion or vasogenic edema. No acute intracranial hemorrhage. No visible  large vessel territory infarct. No extra-axial fluid collection. Ventricles normal size without hydrocephalus. Vascular: No hyperdense vessel. Skull: Left pterional craniectomy with cranioplasty. Calvarium otherwise intact. No acute scalp soft tissue abnormality. Sinuses/Orbits: Globes orbital soft tissues demonstrate no acute finding. Scattered mucosal thickening noted within the sphenoid ethmoidal sinuses. No mastoid effusion. Other: None. IMPRESSION: 1. Interval decrease in size of left parafalcine mass, now measuring 9 mm, with markedly improved vasogenic edema. Additional right frontal lobe lesion now no longer clearly visualized. Findings consistent with interval response to therapy. No new lesions identified. 2. No other acute intracranial abnormality. 3. Sequelae of prior left pterional craniectomy with cranioplasty. Electronically Signed   By: Jeannine Boga M.D.   On: 09/08/2021 01:14   CT Angio Chest Pulmonary Embolism (PE) W or WO Contrast  Result Date: 09/08/2021 CLINICAL DATA:  Respiratory distress. EXAM: CT ANGIOGRAPHY CHEST WITH CONTRAST TECHNIQUE: Multidetector CT imaging of the chest was performed using the standard protocol during bolus administration of intravenous contrast. Multiplanar CT image reconstructions and MIPs were obtained to evaluate the vascular anatomy. RADIATION DOSE REDUCTION: This exam was performed according to the departmental dose-optimization program which includes automated exposure control, adjustment of the mA and/or kV according to patient size and/or use of iterative reconstruction technique. CONTRAST:  86mL OMNIPAQUE IOHEXOL 350 MG/ML SOLN COMPARISON:  July 27, 2021 FINDINGS: Cardiovascular: A right-sided venous catheter is in place. There is marked severity calcification of the thoracic aorta, without evidence of aortic aneurysm the subsegmental pulmonary arteries are limited in evaluation secondary to overlying areas of artifact and suboptimal opacification  with intravenous contrast. No evidence of pulmonary embolism. Normal heart size. No pericardial effusion. Mediastinum/Nodes: No enlarged mediastinal, hilar, or axillary lymph nodes. Thyroid gland, trachea, and esophagus demonstrate no significant findings. Lungs/Pleura: Diffuse interstitial thickening is seen with mild to moderate severity atelectatic changes noted within the bilateral upper lobes and posterior aspect of the bilateral lower lobes. A large, stable, approximately 9.7 cm x 4.0 cm right  basilar bleb is seen. There is no evidence of a pleural effusion. A small anterior right-sided pneumothorax is present. This extends from the anterior aspect of the right apex to the right lung base where it measures approximately 2.9 cm in maximum AP measurement. This represents a new finding when compared to the prior exam. Upper Abdomen: No acute abnormality. Musculoskeletal: Multilevel degenerative changes are seen throughout the thoracic spine. Other: It should be noted that the study is limited secondary to patient motion. Review of the MIP images confirms the above findings. IMPRESSION: 1. Small anterior right-sided pneumothorax. 2. Diffuse interstitial thickening with mild to moderate severity bilateral upper lobe and bibasilar atelectasis. 3. Marked severity emphysematous lung disease with a large, stable right basilar bleb. Aortic Atherosclerosis (ICD10-I70.0). Electronically Signed   By: Virgina Norfolk M.D.   On: 09/08/2021 00:28   DG CHEST PORT 1 VIEW  Result Date: 09/08/2021 CLINICAL DATA:  Follow-up right pneumothorax. EXAM: PORTABLE CHEST 1 VIEW COMPARISON:  Chest x-rays from the last 2 days. FINDINGS: An 8 mm pneumothorax is identified in the right upper lobe. The measurement was made laterally. This compares to a 5 mm pneumothorax on yesterday's study. Small bilateral pleural effusions with underlying atelectasis. Atelectasis in the bases. Stable cardiomediastinal silhouette with cardiomegaly.  Stable right Port-A-Cath. IMPRESSION: 1. The right-sided pneumothorax is a little larger in the interval measuring 8 mm today versus 5 mm previously. 2. Small bilateral pleural effusions with associated atelectasis, more pronounced in the interval. 3. No other acute interval changes. These results will be called to the ordering clinician or representative by the Radiologist Assistant, and communication documented in the PACS or [employer]. Electronically Signed   By: Dorise Bullion III M.D.   On: 09/08/2021 15:09   DG CHEST PORT 1 VIEW  Result Date: 09/08/2021 CLINICAL DATA:  Pneumothorax EXAM: PORTABLE CHEST 1 VIEW COMPARISON:  Chest x-ray and CT scan of the chest obtained yesterday FINDINGS: Stable to slightly decreased trace right apical pneumothorax. Right IJ power injectable port catheter remains in unchanged position with the tip overlying the cavoatrial junction. Advanced bilateral interstitial airspace opacities consistent with pulmonary fibrosis. Linear atelectasis in the right middle lobe. Stable cardiomegaly. Atherosclerotic calcifications present in the transverse aorta. IMPRESSION: Stable to slightly decreased tiny right apical pneumothorax. Lower lung volumes with new right middle lobe atelectasis. Electronically Signed   By: Jacqulynn Cadet M.D.   On: 09/08/2021 08:03   DG Chest Port 1 View  Result Date: 09/30/2021 CLINICAL DATA:  Respiratory distress EXAM: PORTABLE CHEST 1 VIEW COMPARISON:  Chest CT 07/27/2021, radiograph 02/05/2017 FINDINGS: Right-sided central venous port tip over the cavoatrial region. Enlarged cardiomediastinal silhouette with mild vascular congestion. Underlying emphysema and chronic interstitial lung disease. Streaky bibasilar opacities may be due to atelectasis or scar. Known left lower lobe lung mass is better seen on CT. Aortic atherosclerosis IMPRESSION: 1. Cardiomegaly with suspected slight vascular congestion. 2. Underlying emphysema and chronic lung  disease. Scarring or atelectasis at the bases. Known left lower lobe lung mass is better seen on CT Electronically Signed   By: Donavan Foil M.D.   On: 09/20/2021 19:36   ECHOCARDIOGRAM COMPLETE  Result Date: 09/08/2021    ECHOCARDIOGRAM REPORT   Patient Name:   JUDIA ARNOTT Pitner Date of Exam: 09/08/2021 Medical Rec #:  [id number]           Height:       58.0 in Accession #:    [id number]  Weight:       156.7 lb Date of Birth:  02/23/1936          BSA:          1.642 m Patient Age:    14 years            BP:           142/67 mmHg Patient Gender: F                   HR:           61 bpm. Exam Location:  Inpatient Procedure: 2D Echo, Cardiac Doppler and Color Doppler Indications:    Elevated troponin, respiratory distress, cardiomegaly  History:        Patient has no prior history of Echocardiogram examinations.                 Risk Factors:Hypertension and Sleep Apnea.  Sonographer:    Jyl Heinz Referring Phys: [place]  1. Left ventricular ejection fraction, by estimation, is 60 to 65%. The left ventricle has normal function. The left ventricle has no regional wall motion abnormalities. Left ventricular diastolic parameters are consistent with Grade I diastolic dysfunction (impaired relaxation).  2. Right ventricular systolic function is normal. The right ventricular size is normal.  3. Left atrial size was mildly dilated.  4. Trivial mitral valve regurgitation.  5. Aortic valve regurgitation is not visualized.  6. The inferior vena cava is dilated in size with >50% respiratory variability, suggesting right atrial pressure of 8 mmHg. Comparison(s): No prior Echocardiogram. FINDINGS  Left Ventricle: Left ventricular ejection fraction, by estimation, is 60 to 65%. The left ventricle has normal function. The left ventricle has no regional wall motion abnormalities. The left ventricular internal cavity size was normal in size. There is  no left ventricular hypertrophy. Left  ventricular diastolic parameters are consistent with Grade I diastolic dysfunction (impaired relaxation). Right Ventricle: The right ventricular size is normal. Right ventricular systolic function is normal. Left Atrium: Left atrial size was mildly dilated. Right Atrium: Right atrial size was normal in size. Pericardium: There is no evidence of pericardial effusion. Mitral Valve: Trivial mitral valve regurgitation. Tricuspid Valve: Tricuspid valve regurgitation is mild. Aortic Valve: Aortic valve regurgitation is not visualized. Aortic valve peak gradient measures 10.6 mmHg. Pulmonic Valve: Pulmonic valve regurgitation is not visualized. Venous: The inferior vena cava is dilated in size with greater than 50% respiratory variability, suggesting right atrial pressure of 8 mmHg. IAS/Shunts: No atrial level shunt detected by color flow Doppler.  LEFT VENTRICLE PLAX 2D LVIDd:         3.90 cm     Diastology LVIDs:         2.80 cm     LV e' medial:    4.79 cm/s LV PW:         1.00 cm     LV E/e' medial:  18.2 LV IVS:        1.10 cm     LV e' lateral:   5.87 cm/s LVOT diam:     2.00 cm     LV E/e' lateral: 14.8 LV SV:         61 LV SV Index:   37 LVOT Area:     3.14 cm  LV Volumes (MOD) LV vol d, MOD A2C: 73.7 ml LV vol d, MOD A4C: 67.1 ml LV vol s, MOD A2C: 27.7 ml LV vol s, MOD A4C: 25.5  ml LV SV MOD A2C:     46.0 ml LV SV MOD A4C:     67.1 ml LV SV MOD BP:      43.1 ml RIGHT VENTRICLE             IVC RV Basal diam:  3.70 cm     IVC diam: 2.20 cm RV Mid diam:    3.20 cm RV S prime:     11.40 cm/s TAPSE (M-mode): 2.1 cm LEFT ATRIUM             Index        RIGHT ATRIUM           Index LA diam:        3.10 cm 1.89 cm/m   RA Area:     15.70 cm LA Vol (A2C):   61.5 ml 37.46 ml/m  RA Volume:   44.20 ml  26.92 ml/m LA Vol (A4C):   45.4 ml 27.65 ml/m LA Biplane Vol: 52.7 ml 32.10 ml/m  AORTIC VALVE AV Area (Vmax): 1.52 cm AV Vmax:        163.00 cm/s AV Peak Grad:   10.6 mmHg LVOT Vmax:      79.00 cm/s LVOT Vmean:      55.700 cm/s LVOT VTI:       0.195 m  AORTA Ao Root diam: 3.30 cm Ao Asc diam:  2.60 cm MITRAL VALVE               TRICUSPID VALVE MV Area (PHT): 3.05 cm    TR Peak grad:   35.8 mmHg MV Decel Time: 249 msec    TR Vmax:        299.00 cm/s MV E velocity: 87.00 cm/s MV A velocity: 94.70 cm/s  SHUNTS MV E/A ratio:  0.92        Systemic VTI:  0.20 m                            Systemic Diam: 2.00 cm Phineas Inches Electronically signed by Phineas Inches Signature Date/Time: 09/08/2021/12:43:45 PM    Final     Scheduled Meds:    chlorhexidine  15 mL Mouth Rinse BID   Chlorhexidine Gluconate Cloth  6 each Topical Daily   enoxaparin (LOVENOX) injection  40 mg Subcutaneous Q24H   mouth rinse  15 mL Mouth Rinse q12n4p   sodium chloride flush  3 mL Intravenous Q12H    Continuous Infusions:    ceFEPime (MAXIPIME) IV Stopped (09/09/21 6761)   dexmedetomidine (PRECEDEX) IV infusion 0.3 mcg/kg/hr (09/09/21 9509)   magnesium sulfate bolus IVPB 4 g (09/09/21 0834)   potassium chloride 10 mEq (09/09/21 0942)     LOS: 2 days     Vernell Leep, MD,  FACP, [facility], [facility], [facility] (Care Management Physician Certified) [place]  To contact the attending provider between 7A-7P or the covering provider during after hours 7P-7A, please log into the web site [email or web address] and access using universal [place] password for that web site. If you do not have the password, please call the hospital operator.  09/09/2021, 10:29 AM

## 2021-09-09 NOTE — Progress Notes (Signed)
Patient has began to remove her mask. Patient has removed venturi mask x2. Patient desatted to 78% the first time, and 83% the second time. Patient's masked placed on patient with quick recovery. Patient in NAD at the time, noted to still be confused

## 2021-09-10 ENCOUNTER — Ambulatory Visit (HOSPITAL_COMMUNITY): Payer: Medicare HMO

## 2021-09-10 ENCOUNTER — Inpatient Hospital Stay (HOSPITAL_COMMUNITY): Payer: Medicare HMO

## 2021-09-10 DIAGNOSIS — J939 Pneumothorax, unspecified: Secondary | ICD-10-CM | POA: Diagnosis not present

## 2021-09-10 DIAGNOSIS — I248 Other forms of acute ischemic heart disease: Secondary | ICD-10-CM | POA: Diagnosis not present

## 2021-09-10 DIAGNOSIS — C3490 Malignant neoplasm of unspecified part of unspecified bronchus or lung: Secondary | ICD-10-CM

## 2021-09-10 DIAGNOSIS — C3432 Malignant neoplasm of lower lobe, left bronchus or lung: Secondary | ICD-10-CM | POA: Diagnosis not present

## 2021-09-10 DIAGNOSIS — Z7189 Other specified counseling: Secondary | ICD-10-CM | POA: Diagnosis not present

## 2021-09-10 DIAGNOSIS — T451X5A Adverse effect of antineoplastic and immunosuppressive drugs, initial encounter: Secondary | ICD-10-CM | POA: Diagnosis not present

## 2021-09-10 DIAGNOSIS — Z515 Encounter for palliative care: Secondary | ICD-10-CM | POA: Diagnosis not present

## 2021-09-10 DIAGNOSIS — R410 Disorientation, unspecified: Secondary | ICD-10-CM

## 2021-09-10 DIAGNOSIS — J439 Emphysema, unspecified: Secondary | ICD-10-CM | POA: Diagnosis not present

## 2021-09-10 DIAGNOSIS — J9690 Respiratory failure, unspecified, unspecified whether with hypoxia or hypercapnia: Secondary | ICD-10-CM | POA: Diagnosis not present

## 2021-09-10 DIAGNOSIS — G9340 Encephalopathy, unspecified: Secondary | ICD-10-CM | POA: Diagnosis not present

## 2021-09-10 DIAGNOSIS — E876 Hypokalemia: Secondary | ICD-10-CM | POA: Diagnosis not present

## 2021-09-10 DIAGNOSIS — D6481 Anemia due to antineoplastic chemotherapy: Secondary | ICD-10-CM | POA: Diagnosis not present

## 2021-09-10 DIAGNOSIS — C7931 Secondary malignant neoplasm of brain: Secondary | ICD-10-CM | POA: Diagnosis not present

## 2021-09-10 DIAGNOSIS — J9621 Acute and chronic respiratory failure with hypoxia: Secondary | ICD-10-CM | POA: Diagnosis not present

## 2021-09-10 LAB — BASIC METABOLIC PANEL
Anion gap: 8 (ref 5–15)
BUN: 9 mg/dL (ref 8–23)
CO2: 28 mmol/L (ref 22–32)
Calcium: 8 mg/dL — ABNORMAL LOW (ref 8.9–10.3)
Chloride: 104 mmol/L (ref 98–111)
Creatinine, Ser: 0.45 mg/dL (ref 0.44–1.00)
GFR, Estimated: >60 mL/min (ref >60)
Glucose, Bld: 71 mg/dL (ref 70–99)
Potassium: 3.4 mmol/L — ABNORMAL LOW (ref 3.5–5.1)
Sodium: 140 mmol/L (ref 135–145)

## 2021-09-10 LAB — URINE CULTURE: Culture: >=100000 — AB

## 2021-09-10 LAB — CBC
HCT: 23.5 % — ABNORMAL LOW (ref 36.0–46.0)
Hemoglobin: 7.3 g/dL — ABNORMAL LOW (ref 12.0–15.0)
MCH: 29.9 pg (ref 26.0–34.0)
MCHC: 31.1 g/dL (ref 30.0–36.0)
MCV: 96.3 fL (ref 80.0–100.0)
Platelets: 168 10*3/uL (ref 150–400)
RBC: 2.44 MIL/uL — ABNORMAL LOW (ref 3.87–5.11)
RDW: 21 % — ABNORMAL HIGH (ref 11.5–15.5)
WBC: 3.6 10*3/uL — ABNORMAL LOW (ref 4.0–10.5)
nRBC: 1.1 % — ABNORMAL HIGH (ref 0.0–0.2)

## 2021-09-10 LAB — MAGNESIUM: Magnesium: 1.9 mg/dL (ref 1.7–2.4)

## 2021-09-10 MED ORDER — HYDROMORPHONE HCL 1 MG/ML IJ SOLN: 1.0000 mg | INTRAMUSCULAR | Status: DC | PRN

## 2021-09-10 MED ORDER — CHLORHEXIDINE GLUCONATE CLOTH 2 % EX PADS: 6.0000 | MEDICATED_PAD | Freq: Every day | CUTANEOUS | Status: DC

## 2021-09-10 MED ORDER — GLYCOPYRROLATE 1 MG PO TABS: 1.0000 mg | ORAL_TABLET | ORAL | Status: DC | PRN

## 2021-09-10 MED ORDER — SODIUM CHLORIDE 0.9 % IV SOLN
1.0000 mg/h | INTRAVENOUS | Status: DC
  Filled 2021-09-10: qty 5

## 2021-09-10 MED ORDER — POLYVINYL ALCOHOL 1.4 % OP SOLN
1.0000 [drp] | Freq: Four times a day (QID) | OPHTHALMIC | Status: DC | PRN
Start: 2021-09-10 — End: 2021-09-11

## 2021-09-10 MED ORDER — GLYCOPYRROLATE 0.2 MG/ML IJ SOLN
0.2000 mg | INTRAMUSCULAR | Status: DC | PRN
  Administered 2021-09-10: 0.2 mg via INTRAVENOUS
  Filled 2021-09-10: qty 1

## 2021-09-10 MED ORDER — HYDROMORPHONE BOLUS VIA INFUSION
1.0000 mg | INTRAVENOUS | Status: DC | PRN
  Administered 2021-09-10: 2 mg via INTRAVENOUS

## 2021-09-10 MED ORDER — HYDROMORPHONE BOLUS VIA INFUSION
1.0000 mg | INTRAVENOUS | Status: DC | PRN
  Administered 2021-09-10 (×2): 1 mg via INTRAVENOUS

## 2021-09-10 MED ORDER — SODIUM CHLORIDE 0.9 % IV SOLN
1.0000 mg/h | INTRAVENOUS | Status: DC
  Administered 2021-09-10: 1 mg/h via INTRAVENOUS
  Administered 2021-09-10: 3 mg/h via INTRAVENOUS
  Filled 2021-09-10 (×3): qty 2.5

## 2021-09-10 MED ORDER — SODIUM CHLORIDE 0.9% FLUSH: 10.0000 mL | INTRAVENOUS | Status: DC | PRN

## 2021-09-10 MED ORDER — POTASSIUM CHLORIDE 10 MEQ/100ML IV SOLN
10.0000 meq | INTRAVENOUS | Status: DC
  Administered 2021-09-10: 10 meq via INTRAVENOUS
  Filled 2021-09-10: qty 100

## 2021-09-10 MED ORDER — DIPHENHYDRAMINE HCL 50 MG/ML IJ SOLN: 25.0000 mg | INTRAMUSCULAR | Status: DC | PRN

## 2021-09-10 MED ORDER — GLYCOPYRROLATE 0.2 MG/ML IJ SOLN: 0.2000 mg | INTRAMUSCULAR | Status: DC | PRN

## 2021-09-10 MED ORDER — MORPHINE SULFATE (PF) 2 MG/ML IV SOLN: 2.0000 mg | INTRAVENOUS | Status: DC | PRN

## 2021-09-12 ENCOUNTER — Other Ambulatory Visit: Payer: Medicare HMO

## 2021-09-12 ENCOUNTER — Inpatient Hospital Stay: Payer: Medicare HMO | Admitting: Internal Medicine

## 2021-09-12 ENCOUNTER — Inpatient Hospital Stay: Payer: Medicare HMO

## 2021-09-12 LAB — CULTURE, BLOOD (ROUTINE X 2)
Culture: NO GROWTH
Culture: NO GROWTH
Special Requests: ADEQUATE

## 2021-09-17 ENCOUNTER — Other Ambulatory Visit: Payer: Self-pay | Admitting: Hospice and Palliative Medicine

## 2021-09-17 ENCOUNTER — Ambulatory Visit: Payer: Medicare HMO | Admitting: Radiation Oncology

## 2021-09-17 NOTE — Progress Notes (Incomplete)
{  Select_TRH_Note:[id number]} 

## 2021-09-18 ENCOUNTER — Ambulatory Visit: Payer: Medicare HMO | Admitting: Internal Medicine

## 2021-09-22 NOTE — Progress Notes (Unsigned)
{  Select_TRH_Note:[id number]} 

## 2021-10-06 NOTE — Progress Notes (Addendum)
PROGRESS NOTE   Dawn Gomez  GYF:[id number]    DOB: 08-13-1935    DOA: 09/06/2021  PCP: Biagio Borg, MD   I have briefly reviewed patients previous medical records in [facility].  Chief Complaint  Patient presents with   Respiratory Distress    Brief Narrative:  86 year old married female with PMH of extensive stage SCLC with initial presentation May 2022 with large LLL lung mass, right hilar and mediastinal adenopathy, satellite lung lesions, metastatic liver disease, completed first-line systemic chemotherapy, subsequent observation for 6 months, followed by disease progression with lung lesions, enlarging liver mets and brain mets, s/p whole brain radiation, now completed cycle #4 of second line systemic chemotherapy, other history including COPD, OSA on CPAP, chronic respiratory failure with hypoxia on 4 L/min home oxygen, HLD, hypothyroidism, depression, memory/cognitive impairment, falls at home, presented to ED via EMS on 09/25/2021 from home after being found down and in respiratory distress.  She was reported to have appeared gray and was hypoxic to 50% on home oxygen 4 L/min.  Admitted to stepdown for acute on chronic respiratory failure with hypoxia, multifactorial, lactic acidosis, acute encephalopathy.  CCM consulted.  Agree that overall very poor prognosis.  Palliative care has been closely following with ongoing goals of care discussions with son.  Patient clearly worse today with worsening mental status changes, requiring frequent nursing assessments, as needed meds.  Addendum: PCCM met with son in the morning and patient now transitioned to full comfort care.   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  Pneumonia.  Clinically does not appear volume overloaded but BNP elevated.  Currently on Ventimask at 7 L/min, FiO2 30%.  Sedated for confusion and agitation.  CCM input appreciated, discussed with Dr. Chase Caller.  BiPAP contraindicated due to PTX.  Chest tube if PTX worsens but high risk for permanent bronchopleural fistula given blebs.  Chest x-ray 6/3: Stable to slightly decreased tiny right apical PTX.  Dr. Chase Caller recommends terminal comfort care and I agree.  Consulted palliative care medicine.  Will attempt to reach out oncology to keep them in the loop.  MRSA PCR negative, discontinue vancomycin, continue cefepime.  S/p IV Lasix 30 mg mg x 1 on 6/3.  CTA chest as below appreciated.  COVID-19 RT-PCR negative.  Palliative team consultation 6/3 appreciated, plan to meet with family again today to advance goals of care discussions.  Son was waiting for extended family to come meet with patient.  Has worsened: Increased oxygen requirement, up to Ventimask, 9 L/min, 50% FiO2, increased restlessness and Precedex increased 2.3 with ongoing as needed fentanyl, chest x-ray with slightly worsened pneumothorax.  Most appropriate would be transitioning to full comfort care.  Remains with high O2 requirement.  Even if transitioned to full comfort care/hospice, not sure if she will be stable to discharge to residential hospice.  Added Dr. Julien Nordmann, primary oncologist to care team.  Would be most appropriate to transition to comfort  care today.   Acute metabolic encephalopathy: Acute AMS complicating underlying chronic cognitive impairment.  Again likely multifactorial related to hypoxia, meds,?  Infection and lactic acidosis complicating underlying brain mets.  Overnight agitation, currently on low-dose Precedex drip and bilateral hand mittens.  CT head actually shows improvement in brain mets, responding to therapy.  Delirium precautions.  ABG without CO2 retention.  TSH normal.  Now on increased dose of Precedex and getting as needed fentanyl.  This is helping.  Blood cultures x2: Negative to date.  Urine culture shows >100 K colonies of gram-negative rods.  Already on IV cefepime. As per RN and my opinion, mental status has clearly worsened, requiring more frequent assessments and as needed meds.  Remains on Precedex at 0.4, getting more frequent IV fentanyl's.  Also today's x-ray is reported as pneumothorax is larger than prior, PCCM on board.  Elevated troponin: Likely due to demand ischemia from acute respiratory failure with hypoxia and other critical illness noted above.  Decreasing trend.  No angina reported.  Will not be a candidate for any aggressive interventions.  TTE with preserved LVEF and grade 1 diastolic dysfunction.  Lactic acidosis: Initial lactate of 4.9 which has since normalized.  Secondary to acute respiratory failure.  IV fluids appropriately discontinued.  Resolved.  Acute on chronic anemia: Hemoglobin dropped from 9.4 on admission to 7.4.  No overt bleeding reported.  Suspect due to recent cancer chemotherapy and current hemodilution.  S/p 2 units PRBC on 5/25.  Follow CBC in a.m. and consider PRBC transfusion if hemoglobin drops to 7 or less if aggressive care is considered by family.  Hemoglobin stable at 7.4  Thrombocytopenia: Possibly due to acute illness, antibiotics, infectious etiology.  Resolved.  Hypokalemia: Ongoing daily replacement.  Continue to replace until final goals of care  decisions.  Hypomagnesemia: Magnesium 1.3, replace IV and follow.   SCLC metastatic to liver and brain:  Dx May 2022, followed by Dr. Earlie Server. 1st line chemo Jun-Aug 2022, whole brain irradiation by Dr. Lisbeth Renshaw March 2023. 2nd line chemotherapy started March 2023, ongoing and completed fourth cycle mid May.  Follows with Dr. Curt Bears. Palliative care consulted and communicated with Dr. Lorenso Courier 6/3.   OSA:  - Can use CPAP overnight   Frequent falls: Pt will need 24 hour supervision.  - PT/OT/TOC consult provided she is not transitioned to full comfort care and improves to be evaluated.    Depression:  -Hold home SSRI given AMS.   Hypothyroidism:  -TSH normal.   Hypoalbuminemia: Noted   HTN, SVT:  -Hold antihypertensives, unable to give p.o. due to AMS.  Unsafe.   HLD:  - Hold statin for now  Body mass index is 31.19 kg/m.   DVT prophylaxis: enoxaparin (LOVENOX) injection 40 mg Start: 09/08/21 1000     Code Status: DNR:  Family Communication: Unable to reach son 6/3, left VM message.  No family at bedside. Disposition:  Status is: Inpatient Remains inpatient appropriate because: Critically ill     Consultants:   PCCM Palliative care medicine  Procedures:   Ventimask  Antimicrobials:   IV cefepime IV vancomycin discontinued.   Subjective:  Patient alert.  After introducing myself when I started to talk, she became paranoid "who are you, who are you?"  And  then started screaming "help, help".  RN reported that this has been ongoing overnight and has clearly worsened compared to prior nights and she has taken care of this patient for 3 nights.  Objective:   Vitals:   October 05, 2021 0414 10/05/21 0500 2021-10-05 0600 2021-10-05 0700  BP:   (!) 125/47 (!) 93/40  Pulse:   (!) 54 (!) 48  Resp:   19 17  Temp: 98.2 F (36.8 C)     TempSrc: Axillary     SpO2:   99% 98%  Weight:  67.7 kg    Height:        General exam: Elderly female, small built and chronically  ill looking lying slightly propped up in bed with no obvious distress.  Has a fullface Ventimask at 9 L/min, FiO2 50 %, saturating in the mid 90s.  Generalized muscle wasting, bitemporal wasting, diminished subcutaneous fat. Respiratory system: No obvious respiratory distress but reduced breath sounds bilaterally. Cardiovascular system: S1 & S2 heard, RRR. No JVD, murmurs, rubs, gallops or clicks. No pedal edema.   Gastrointestinal system: Abdomen is nondistended, soft and nontender. No organomegaly or masses felt. Normal bowel sounds heard. Central nervous system: Mental status as noted above. No focal neurological deficits. Extremities: Moving all extremities.  Bilateral hand mittens Skin: No rashes, lesions or ulcers Psychiatry: Judgement and insight impaired. Mood & affect cannot be assessed.     Data Reviewed:   I have personally reviewed following labs and imaging studies   CBC: Recent Labs  Lab 09/06/21 1350 09/06/21 1350 09/19/2021 1913 09/08/21 0430 09/09/21 0555 10/05/21 0500  WBC 2.8*   < > 6.6 4.4 3.7* 3.6*  NEUTROABS 1.6*  --  4.9 3.0  --   --   HGB 8.3*   < > 9.4* 7.4* 7.4* 7.3*  HCT 25.4*  --  29.3* 22.7* 23.2* 23.5*  MCV 91.0  --  94.2 94.2 94.7 96.3  PLT 92*   < > 155 140* 155 168   < > = values in this interval not displayed.    Basic Metabolic Panel: Recent Labs  Lab 09/06/2021 1913 09/08/21 0430 09/09/21 0555 09/09/21 1606 October 05, 2021 0500  NA 140 137 140 139 140  K 4.2 3.2* 2.6* 3.8 3.4*  CL 103 102 99 101 104  CO2 26 29 33* 30 28  GLUCOSE 198* 105* 80 76 71  BUN 11 8 9 10 9   CREATININE 0.68 0.47 0.47 0.40* 0.45  CALCIUM 9.6 8.7* 8.3* 8.0* 8.0*  MG  --   --  1.3* 2.2 1.9  PHOS  --   --  3.8  --   --     Liver Function Tests: Recent Labs  Lab 09/06/21 1350 09/17/2021 1913 09/08/21 0430 09/09/21 0555  AST 19 36 35 26  ALT 29 29 28 25   ALKPHOS 45 51 41 44  BILITOT 1.1 1.3* 1.0 0.9  PROT 5.7* 6.4* 4.9* 4.8*  ALBUMIN 3.3* 3.1* 2.5* 2.4*     CBG: No results for input(s): GLUCAP in the last 168 hours.  Microbiology Studies:   Recent Results (from the past 240 hour(s))  Culture, blood (Routine x 2)     Status: None (Preliminary result)   Collection Time: 09/15/2021  7:11 PM   Specimen: BLOOD  Result Value Ref Range Status   Specimen Description   Final    BLOOD BLOOD LEFT FOREARM Performed at [facility] [address], [place], [place] [address]    Special Requests  Final    BOTTLES DRAWN AEROBIC AND ANAEROBIC Blood Culture adequate volume Performed at [facility] [address], [place], [place] [address]    Culture   Final    NO GROWTH 2 DAYS Performed at [facility] [address] [place], [place] [address]    Report Status PENDING  Incomplete  Urine Culture     Status: Abnormal (Preliminary result)   Collection Time: 10/05/2021  7:55 PM   Specimen: In/Out Cath Urine  Result Value Ref Range Status   Specimen Description   Final    IN/OUT CATH URINE Performed at [facility] [address], [place], [place] [address]    Special Requests   Final    NONE Performed at [facility], [place] [address], [place], [place] [address]    Culture (A)  Final    >=100,000 COLONIES/mL PROTEUS MIRABILIS SUSCEPTIBILITIES TO FOLLOW Performed at [facility], [place] [address], [place], [place] [address]    Report Status PENDING  Incomplete  Culture, blood (Routine x 2)     Status: None (Preliminary result)   Collection Time: 09/17/2021  8:50 PM   Specimen: BLOOD  Result Value Ref Range Status   Specimen Description   Final    BLOOD PORTA CATH Performed at [facility] [address], [place], [place] [address]    Special Requests   Final    BOTTLES DRAWN AEROBIC AND ANAEROBIC Blood Culture results may not be optimal due to an excessive volume of blood received in culture bottles Performed at [facility] [address] [place], [place] [address]    Culture   Final    NO GROWTH 2 DAYS Performed at [facility] [name] [place], [place] [address]    Report Status PENDING  Incomplete  MRSA Next Gen by PCR, Nasal     Status: None   Collection Time: 09/08/21  1:12 AM   Specimen: Nasal Mucosa; Nasal Swab  Result Value Ref Range Status   MRSA by PCR Next Gen NOT DETECTED NOT DETECTED Final    Comment: (NOTE) The GeneXpert MRSA Assay (FDA approved for NASAL specimens only), is one component of a comprehensive MRSA colonization surveillance program. It is not intended to diagnose MRSA infection nor to guide or monitor treatment for MRSA infections. Test performance is not FDA approved in patients less than 8 years old. Performed at [facility], [place] [address] [place],  [address]   SARS Coronavirus 2 by RT PCR (hospital order, performed in [facility] hospital lab) *cepheid single result test* Anterior Nasal Swab     Status: None   Collection Time: 09/08/21  4:30 AM   Specimen: Anterior Nasal Swab  Result Value Ref Range Status   SARS Coronavirus 2 by RT PCR NEGATIVE NEGATIVE Final    Comment: (NOTE) SARS-CoV-2 target nucleic acids are NOT DETECTED.  The SARS-CoV-2 RNA is generally detectable in upper and lower respiratory specimens during the acute phase of infection. The lowest concentration of SARS-CoV-2 viral copies this assay can detect is 250 copies / mL. A negative result does not preclude SARS-CoV-2 infection and should not be used as the sole basis for treatment or other patient management decisions.  A negative result may occur with improper specimen collection / handling, submission of specimen other than nasopharyngeal swab, presence of viral mutation(s) within the areas targeted by this assay, and inadequate number  of viral copies (<250 copies / mL). A negative result must be combined with  clinical observations, patient history, and epidemiological information.  Fact Sheet for Patients:   https://www.patel.info/  Fact Sheet for Healthcare Providers: https://hall.com/  This test is not yet approved or  cleared by the [place] FDA and has been authorized for detection and/or diagnosis of SARS-CoV-2 by FDA under an Emergency Use Authorization (EUA).  This EUA will remain in effect (meaning this test can be used) for the duration of the COVID-19 declaration under Section 564(b)(1) of the Act, 21 U.S.C. section 360bbb-3(b)(1), unless the authorization is terminated or revoked sooner.  Performed at [facility], [place] [address], [place], [place] [address]     Radiology Studies:  DG CHEST PORT 1 VIEW  Result Date: 2021-09-24 CLINICAL DATA:  Respiratory failure EXAM: PORTABLE CHEST 1 VIEW COMPARISON:  September 09, 2021 FINDINGS: The heart size and mediastinal contours are stable. The heart size is enlarged. Right central venous line is unchanged. Previously noted right pneumothorax is increased in size with pleural line at the fourth and fifth rib line. There is a probable basilar component of right pneumothorax. The visualized skeletal structures are stable. IMPRESSION: Previously noted right pneumothorax is increased in size. These results will be called to the ordering clinician or representative by the Radiologist Assistant, and communication documented in the PACS or [employer]. Electronically Signed   By: Abelardo Diesel M.D.   On: 2021-09-24 07:21   DG CHEST PORT 1 VIEW  Result Date: 09/09/2021 CLINICAL DATA:  Pneumothorax EXAM: PORTABLE CHEST 1 VIEW COMPARISON:  09/08/2021 FINDINGS: Stable positioning of a right-sided chest port. Cardiomegaly. Aortic atherosclerosis. Stable small right apical pneumothorax. Increasing right basilar airspace opacity. Small bilateral pleural effusions. No left-sided pneumothorax.  IMPRESSION: 1. Stable small right apical pneumothorax. 2. Increasing right basilar airspace opacity. 3. Small bilateral pleural effusions. Electronically Signed   By: Davina Poke D.O.   On: 09/09/2021 11:41   DG CHEST PORT 1 VIEW  Result Date: 09/08/2021 CLINICAL DATA:  Follow-up right pneumothorax. EXAM: PORTABLE CHEST 1 VIEW COMPARISON:  Chest x-rays from the last 2 days. FINDINGS: An 8 mm pneumothorax is identified in the right upper lobe. The measurement was made laterally. This compares to a 5 mm pneumothorax on yesterday's study. Small bilateral pleural effusions with underlying atelectasis. Atelectasis in the bases. Stable cardiomediastinal silhouette with cardiomegaly. Stable right Port-A-Cath. IMPRESSION: 1. The right-sided pneumothorax is a little larger in the interval measuring 8 mm today versus 5 mm previously. 2. Small bilateral pleural effusions with associated atelectasis, more pronounced in the interval. 3. No other acute interval changes. These results will be called to the ordering clinician or representative by the Radiologist Assistant, and communication documented in the PACS or [employer]. Electronically Signed   By: Dorise Bullion III M.D.   On: 09/08/2021 15:09    Scheduled Meds:    chlorhexidine  15 mL Mouth Rinse BID   Chlorhexidine Gluconate Cloth  6 each Topical Daily   enoxaparin (LOVENOX) injection  40 mg Subcutaneous Q24H   mouth rinse  15 mL Mouth Rinse q12n4p   sodium chloride flush  3 mL Intravenous Q12H    Continuous Infusions:    ceFEPime (MAXIPIME) IV 2 g (2021/09/24 0829)   dexmedetomidine (PRECEDEX) IV infusion 0.4 mcg/kg/hr (2021-09-24 0700)   potassium chloride       LOS: 3 days     Vernell Leep, MD,  FACP, [facility], [facility], [facility] (Care Management Physician  Certified) [facility] [place]  To contact the attending provider between 7A-7P or the covering provider during after hours 7P-7A, please log into the web site  [email or web address] and access using universal [place] password for that web site. If you do not have the password, please call the hospital operator.  28-Sep-2021, 8:49 AM

## 2021-10-06 NOTE — Death Summary Note (Signed)
   DEATH SUMMARY   Patient Details  Name: Dawn Gomez MRN: [id number] DOB: 1936-01-29 NUU:VOZD, Len Blalock, MD Admission/Discharge Information   Admit Date:  09-16-21  Date of Death: Date of Death: Sep 19, 2021  Time of Death: Time of Death: April 22, 2223  Length of Stay: 3   Principle Cause of death: SCLC metastatic to liver and brain   Hospital Diagnoses: Principal Problem:   Acute on chronic respiratory failure with hypoxia (HCC) Active Problems:   Interstitial lung disease (HCC)   OSA (obstructive sleep apnea)   Hyperlipidemia   SVT (supraventricular tachycardia) (HCC)   Depression   Hypothyroidism   Psoriasis   Emphysema lung (HCC)   Small cell carcinoma of lower lobe of left lung (HCC)   Malignant neoplasm metastatic to brain (HCC)   Anemia due to antineoplastic chemotherapy   Hypoalbuminemia   Pyuria   Lactic acidosis   Acute encephalopathy   Demand ischemia (HCC)   Volume overload   Pneumothorax   HTN (hypertension)   Frequent falls   Hospital Course: 86 year old married female with PMH of extensive stage SCLC with initial presentation May 2022 with large LLL lung mass, right hilar and mediastinal adenopathy, satellite lung lesions, metastatic liver disease, completed first-line systemic chemotherapy, subsequent observation for 6 months, followed by disease progression with lung lesions, enlarging liver mets and brain mets, s/p whole brain radiation, completed cycle #4 of second line systemic chemotherapy, other history including COPD, OSA on CPAP, chronic respiratory failure with hypoxia on 4 L/min home oxygen, HLD, hypothyroidism, depression, memory/cognitive impairment, and falls at home, who presented to ED via EMS on 2021-09-16 from home after being found down and in respiratory distress.  She was reported to have appeared gray and was hypoxic to 50% on home oxygen 4 L/min.  Admitted to stepdown for acute on chronic respiratory failure with hypoxia, multifactorial,  lactic acidosis, acute encephalopathy.  CCM consulted and agreed that overall very poor prognosis.  Palliative care closely followed with ongoing goals of care discussions with son.  Patient worsened during course of hospital stay with declining mental status. The compassionate decision was made by family to transition to comfort focused care. The full medical team agreed this was most appropriate. The patient died peacefully during this hospital stay.    Signed: Lonia Blood, MD 09/28/2021

## 2021-10-06 NOTE — Progress Notes (Signed)
Daily Progress Note   Patient Name: Dawn Gomez       Date: 26-Sep-2021 DOB: September 22, 1935  Age: 86 y.o. MRN#: [id number] Attending Physician: Modena Jansky, MD Primary Care Physician: Biagio Borg, MD Admit Date: 09/17/2021  Reason for Consultation/Follow-up: Establishing goals of care  Subjective: Discussed case with Eliseo Gum from [facility].  Shift made to full comfort care this morning.  I saw and examined Ms. Peake.  She was lying in bed in no distress at time of my encounter.  Unresponsive with some periods of apnea noted.  Family, including son at bedside.  Offered supportive care.    All questions answered to the best of my ability.  Length of Stay: 3  Current Medications: Scheduled Meds:   chlorhexidine  15 mL Mouth Rinse BID   Chlorhexidine Gluconate Cloth  6 each Topical Daily   mouth rinse  15 mL Mouth Rinse q12n4p   sodium chloride flush  3 mL Intravenous Q12H    Continuous Infusions:  dexmedetomidine (PRECEDEX) IV infusion Stopped (09/26/2021 1130)   HYDROmorphone 1.5 mg/hr (September 26, 2021 1600)    PRN Meds: acetaminophen **OR** acetaminophen, diphenhydrAMINE, fentaNYL (SUBLIMAZE) injection, glycopyrrolate **OR** glycopyrrolate **OR** glycopyrrolate, haloperidol lactate, HYDROmorphone, ipratropium-albuterol, midazolam, ondansetron **OR** ondansetron (ZOFRAN) IV, polyvinyl alcohol, sodium chloride flush  Physical Exam         General: Somnolent, apenic periods noted Lungs: Decreased air movement Ext: No significant edema Skin: Warm and dry  Vital Signs: BP 135/65   Pulse 76   Temp 97.8 F (36.6 C) (Oral)   Resp 13   Ht 4\' 10"  (1.473 m)   Wt 67.7 kg   SpO2 (!) 85%   BMI 31.19 kg/m  SpO2: SpO2: (!) 85 % O2 Device: O2 Device: Venturi Mask O2 Flow Rate: O2  Flow Rate (L/min): 12 L/min  Intake/output summary:  Intake/Output Summary (Last 24 hours) at 09/26/21 1739 Last data filed at 26-Sep-2021 1600 Gross per 24 hour  Intake 459.05 ml  Output 10 ml  Net 449.05 ml    LBM: Last BM Date : 09/06/2021 Baseline Weight: Weight: 71.1 kg Most recent weight: Weight: 67.7 kg       Palliative Assessment/Data:    Flowsheet Rows    Flowsheet Row Most Recent Value  Intake Tab   Referral Department Hospitalist  Unit at  Time of Referral ICU  Palliative Care Primary Diagnosis Cancer  Date Notified 09/08/21  Palliative Care Type New Palliative care  Reason for referral Clarify Goals of Care  Date of Admission 09/29/2021  Date first seen by Palliative Care 09/08/21  # of days Palliative referral response time 0 Day(s)  # of days IP prior to Palliative referral 1  Clinical Assessment   Palliative Performance Scale Score 20%  Psychosocial & Spiritual Assessment   Palliative Care Outcomes   Patient/Family meeting held? Yes  Who was at the meeting? Son, brother       Patient Active Problem List   Diagnosis Date Noted   Allergic rhinitis 07/16/2016   Chest wall mass 07/16/2016   Anemia due to antineoplastic chemotherapy    Hypoalbuminemia    Pyuria    Lactic acidosis    Acute encephalopathy    Demand ischemia (HCC)    Volume overload    Pneumothorax    HTN (hypertension)    Frequent falls    Pressure injury of thigh, stage 1 07/22/2021   Low blood pressure 07/22/2021   Malignant neoplasm metastatic to brain ([employer]) 06/19/2021   Dehydration 06/18/2021   Pain in right knee 06/07/2021   Anemia 06/02/2021   Cervical radiculitis 05/30/2021   Right knee pain 05/30/2021   RLQ abdominal pain 05/30/2021   Coronary artery calcification seen on CT scan 12/18/2020   Encounter for antineoplastic immunotherapy 09/26/2020   Port-A-Cath in place 09/18/2020   Small cell carcinoma of lower lobe of left lung ([place]) 08/31/2020   Encounter for  antineoplastic chemotherapy 08/31/2020   Frequent nosebleeds 08/17/2020   Right lumbar radiculopathy 12/17/2019   Emphysema lung ([place]) 03/03/2019   Aortic atherosclerosis ([place]) 03/03/2019   Healthcare maintenance 02/01/2019   Right ankle pain 10/06/2018   Sinusitis 08/28/2018   Costochondritis 04/10/2018   Leg pain 03/25/2018   Fall 08/13/2017   Night sweats 07/13/2017   Dysuria 07/08/2017   Psoriasis 02/05/2017   Encounter for well adult exam with abnormal findings 02/05/2017   Chest pain 02/05/2017   Back pain 02/05/2017   Acute on chronic respiratory failure with hypoxia ([place]) 08/06/2016   Hypothyroidism 08/06/2016   Hyperglycemia 08/06/2016   Hyperlipidemia 07/12/2016   SVT (supraventricular tachycardia) ([place]) 07/12/2016   Vitamin D deficiency 07/12/2016   Depression 07/12/2016   Chronic respiratory failure ([place]) 03/24/2015   Interstitial lung disease ([place]) 08/21/2007   OSA (obstructive sleep apnea) 06/26/2007    Palliative Care Assessment & Plan   Patient Profile: 86 y.o. female  with past medical history of Small cell lung cancer with mets to liver and brain currently on second line chemotherapy and status post whole brain radiation, depression, memory impairment, hyperlipidemia, hypothyroidism, chronic hypoxic respiratory failure on 4 L O2, OSA, COP admitted on 09/14/2021 after being found down and in respiratory distress.  Work-up revealed small right pneumothorax, large bleb, and acute on chronic hypoxic respiratory failure.  Palliative consulted for goals of care.  Recommendations/Plan: DNR/DNI Agree with full comfort.  She is actively dying.  Appears comfortable at time of my encounter.  Continue current regimen. Support offered to family.  Code Status:    Code Status Orders  (From admission, onward)           Start     Ordered   09/08/21 0144  Do not attempt resuscitation (DNR)  Continuous       Question Answer Comment  In the event of cardiac or  respiratory ARREST Do not call  a "code blue"   In the event of cardiac or respiratory ARREST Do not perform Intubation, CPR, defibrillation or ACLS   In the event of cardiac or respiratory ARREST Use medication by any route, position, wound care, and other measures to relive pain and suffering. May use oxygen, suction and manual treatment of airway obstruction as needed for comfort.      09/08/21 0147           Code Status History     Date Active Date Inactive Code Status Order ID Comments User Context   08/06/2016 0212 08/10/2016 1939 Full Code [id number]  Karmen Bongo, MD Inpatient      Advance Directive Documentation    Flowsheet Row Most Recent Value  Type of Advance Directive Healthcare Power of Attorney  Pre-existing out of facility DNR order (yellow form or pink MOST form) --  "MOST" Form in Place? --       Prognosis:  Hours  Discharge Planning: Anticipated Hospital Death  Care plan was discussed with son, Grayland Ormond, sister, brother  Thank you for allowing the Palliative Medicine Team to assist in the care of this patient.  Micheline Rough, MD  Please contact Palliative Medicine Team phone at [phone] for questions and concerns.

## 2021-10-06 NOTE — Progress Notes (Addendum)
NAME:  Dawn Gomez, MRN:  [id number], DOB:  1935/04/16, LOS: 3 ADMISSION DATE:  09/09/2021, CONSULTATION DATE:  09/08/21 REFERRING MD:  Algis Liming, CHIEF COMPLAINT:  Hypoxia    History of Present Illness:  86 yo woman found on ground with difficulty breathing.  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 Normally on home o2 4L.   Initial sat 40-50s, improved on NRB 15 L to 97%.     Was initially doing ok on Nasal canula in ED, then at around 11pm, started needing NRB.    Initially elevated lactic acid.     Very confused on arrival to ICU.  Noted pyuria from hosp H and P.   Started on cefepime flagyl vanc    Trop elevated BNP mildly elevated  Pertinent  Medical History   SCLC with mets to liver and brain currently on 2nd line chemotherapy also s/p whole brain irradiation, depression, memory impairment, HLD, hypothyroidism, chronic hypoxic respiratory failure on 4L O2, OSA, COPD  100 pack year smoking, quit 15 years ago.   Had P mentions pulmonary Fibrosis?      Meds: decadron 4mg  BID (06/20/21) Dilt 180mg  daily, lexapro 20mg  qHS, fenofibrate 160mg  daily,  Lasix 40mg  prn , guaifenesin 1200BID prn Duonebs prn, synthroid 154mcg daily, lisinopril 5mg  daily namenda 10mg  BID , crestor 20 mg daily, tizanidine 2mg  TID, tramadol 50 mg QID    has a past medical history of Abnormal heart rhythm, Aortic aneurysm ([place]), Aortic atherosclerosis ([place]) (03/03/2019), Coronary artery calcification seen on CT scan (12/18/2020), Dyspnea, Hypothyroid, nscl ca (08/2020), OSA (obstructive sleep apnea), Psoriasis (02/05/2017), Pulmonary fibrosis ([place]), SVT (supraventricular tachycardia) ([place]), and Thyroid disease.      has a  past surgical history that includes Total abdominal hysterectomy; Tubal ligation; Craniotomy; Abdominal hysterectomy; Hernia repair; Appendectomy; Craniotomy (1980 x 2); Cholecystectomy; and IR IMAGING GUIDED PORT INSERTION (09/15/2020).  Significant Hospital Events: Including procedures, antibiotic start and stop dates in addition to other pertinent events   6/2 admit 6/3 PCCM consult worse hypoxia 6/4 ongoing GOC convo. Rec agasinst chest tube for ptx 6/5 increased size of R ptx. Continues on precedex. Worse mentation   Interim History / Subjective:  CXR this morning with incr size of R ptx   Objective   Blood pressure (!) 93/40, pulse (!) 48, temperature 97.8 F (36.6 C), temperature source Oral, resp. rate 17, height 4\' 10"  (1.473 m), weight 67.7 kg, SpO2 98 %.    FiO2 (%):  [50 %] 50 %   Intake/Output Summary (Last 24 hours) at 2021-10-06 0919 Last data filed at 10/06/2021 0700 Gross per 24 hour  Intake 927.52 ml  Output 130 ml  Net 797.52 ml   Filed Weights   09/08/21 0130 09/09/21 0500 2021/10/06 0500  Weight: 71.1 kg 67.3 kg 67.7 kg    Examination: General: Frail Elderly F asleep NAD  HENT: Venturi mask. Anicteric sclera  Lungs: Symmetrical chest expansion. Shallow respirations  Cardiovascular: bradycardic. Abdomen: soft ndnt  Extremities: no acute joint deformity  Neuro: awakens to touch, moaning, does not follow commands  GU: defer  Resolved Hospital Problem list     Assessment & Plan:   Acute on chronic respiratory failure with hypoxia - baseline 4LNC COPD with  blebs Pulmonary fibrosis R apical ptx, increasing in size 6/5 Atelectasis  -difficult situation. In many instances, chest tube would be consideration for incr ptx, however with this pt have to consider overall prognosis as well as chronic conditions -- very high risk for BPF with underlying processes and moreover unfortunately carries a poor overall prognosis.  P -still recommend deferring chest tube for  now, think that [place] is the most prudent plan to establish and that comfort care would be most appropriate.  -If worsening resp distress would need to consider chest tube for comfort vs medications to help manage resp sx associated with ptx, EOL  -goal O2>88 -PRN duonebs   Extensive stage SCLC  -LLL mass, pleura, liver, brain  P -ongoing GOC.  I am concerned the patient is declaring herself and we may be approaching the time to shift efforts toward comfort. Was able to reach son, Grayland Ormond by phone and relayed these concerns-- he plans to come 6/5 to discuss [place] further   Delirium, ? Terminal delirium -has been requiring precedex gtt for this.  -mentation worse 6/5 P -would try to wean precedex. D/w primary attending, sounds like this has been the most effective agent for agitated delirium, but is also impacting hemodynamics at this point  -PRN versed fent haldol   Bradycardia, medication related -wean precedex    PCCM continues to follow for precedex, ptx    Best Practice (right click and "Reselect all SmartList Selections" daily)   Diet/type: NPO DVT prophylaxis: LMWH GI prophylaxis: N/A Lines: Central line Foley:  N/A Code Status:  DNR Last date of multidisciplinary goals of care discussion [6/4] Son updated opn phone 6/5  Labs   CBC: Recent Labs  Lab 09/06/21 1350 09/11/2021 1913 09/08/21 0430 09/09/21 0555 Oct 08, 2021 0500  WBC 2.8* 6.6 4.4 3.7* 3.6*  NEUTROABS 1.6* 4.9 3.0  --   --   HGB 8.3* 9.4* 7.4* 7.4* 7.3*  HCT 25.4* 29.3* 22.7* 23.2* 23.5*  MCV 91.0 94.2 94.2 94.7 96.3  PLT 92* 155 140* 155 858    Basic Metabolic Panel: Recent Labs  Lab 09/14/2021 1913 09/08/21 0430 09/09/21 0555 09/09/21 1606 2021/10/08 0500  NA 140 137 140 139 140  K 4.2 3.2* 2.6* 3.8 3.4*  CL 103 102 99 101 104  CO2 26 29 33* 30 28  GLUCOSE 198* 105* 80 76 71  BUN 11 8 9 10 9   CREATININE 0.68 0.47 0.47 0.40* 0.45  CALCIUM 9.6 8.7* 8.3* 8.0* 8.0*  MG  --   --  1.3* 2.2 1.9  PHOS   --   --  3.8  --   --    GFR: Estimated Creatinine Clearance: 43.4 mL/min (by C-G formula based on SCr of 0.45 mg/dL). Recent Labs  Lab 09/24/2021 1913 09/26/2021 2241 09/08/21 0430 09/09/21 0555 10/08/21 0500  WBC 6.6  --  4.4 3.7* 3.6*  LATICACIDVEN 4.9* 1.4  --  0.6  --     Liver Function Tests: Recent Labs  Lab 09/06/21 1350 09/19/2021 1913 09/08/21 0430 09/09/21 0555  AST 19 36 35 26  ALT 29 29 28 25   ALKPHOS 45 51 41 44  BILITOT 1.1 1.3* 1.0 0.9  PROT 5.7* 6.4* 4.9* 4.8*  ALBUMIN 3.3* 3.1* 2.5* 2.4*   No results for input(s): LIPASE, AMYLASE in the last 168 hours. No results for input(s): AMMONIA in the last 168 hours.  ABG    Component Value Date/Time   PHART 7.58 (H) 09/08/2021 0805   PCO2ART 34  09/08/2021 0805   PO2ART 42 (L) 09/08/2021 0805   HCO3 32.6 (H) 09/08/2021 0805   TCO2 31 08/05/2016 2237   O2SAT 90.5 09/08/2021 0805     Coagulation Profile: Recent Labs  Lab 09/12/2021 1913  INR 1.2    Cardiac Enzymes: No results for input(s): CKTOTAL, CKMB, CKMBINDEX, TROPONINI in the last 168 hours.  HbA1C: Hgb A1c MFr Bld  Date/Time Value Ref Range Status  08/17/2020 02:37 PM 5.4 4.6 - 6.5 % Final    Comment:    Glycemic Control Guidelines for People with Diabetes:Non Diabetic:  <6%Goal of Therapy: <7%Additional Action Suggested:  >8%   12/17/2019 03:20 PM 5.2 <5.7 % of total Hgb Final    Comment:    For the purpose of screening for the presence of diabetes: . <5.7%       Consistent with the absence of diabetes 5.7-6.4%    Consistent with increased risk for diabetes             (prediabetes) > or =6.5%  Consistent with diabetes . This assay result is consistent with a decreased risk of diabetes. . Currently, no consensus exists regarding use of hemoglobin A1c for diagnosis of diabetes in children. . According to American Diabetes Association (ADA) guidelines, hemoglobin A1c <7.0% represents optimal control in non-pregnant diabetic patients.  Different metrics may apply to specific patient populations.  Standards of Medical Care in Diabetes(ADA). .     CBG: No results for input(s): GLUCAP in the last 168 hours.  CCT: n/a    Eliseo Gum MSN, AGACNP-BC [place] for pager  09/13/21, 9:19 AM

## 2021-10-06 NOTE — TOC Initial Note (Signed)
Transition of Care [facility]) - Initial/Assessment Note   Patient Details  Name: Dawn Gomez MRN: [id number] Date of Birth: Dec 22, 1935  Transition of [facility]) CM/SW Contact:    Sherie Don, LCSW Phone Number: 10-01-21, 10:28 AM  Clinical Narrative: TOC to follow for possible discharge needs.                 Barriers to Discharge: Continued Medical Work up  Expected Discharge Plan and Services In-house Referral: Clinical Social Work Living arrangements for the past 2 months: Single Family Home              DME Arranged: N/A DME Agency: NA  Prior Living Arrangements/Services Living arrangements for the past 2 months: [place] Patient language and need for interpreter reviewed:: Yes Need for Family Participation in Patient Care: Yes (Comment) Care giver support system in place?: Yes (comment) Criminal Activity/Legal Involvement Pertinent to Current Situation/Hospitalization: No - Comment as needed  Activities of Daily Living Home Assistive Devices/Equipment: Eyeglasses, Hearing aid, Shower chair with back, Environmental consultant (specify type) (bilateral hearing aides) ADL Screening (condition at time of admission) Patient's cognitive ability adequate to safely complete daily activities?: No Is the patient deaf or have difficulty hearing?: Yes (bilateral hearing aides) Does the patient have difficulty seeing, even when wearing glasses/contacts?: No Does the patient have difficulty concentrating, remembering, or making decisions?: Yes (got worse after 2 nd treatment) Patient able to express need for assistance with ADLs?: No Does the patient have difficulty dressing or bathing?: Yes Independently performs ADLs?: No Communication: Independent Dressing (OT): Needs assistance Is this a change from baseline?: Pre-admission baseline Grooming: Needs assistance Is this a change from baseline?: Pre-admission baseline Feeding: Independent Bathing: Needs assistance Is this a change  from baseline?: Pre-admission baseline Toileting: Needs assistance Is this a change from baseline?: Pre-admission baseline In/Out Bed: Needs assistance Is this a change from baseline?: Pre-admission baseline Walks in Home: Dependent Is this a change from baseline?: Pre-admission baseline Does the patient have difficulty walking or climbing stairs?: Yes Weakness of Legs: Both Weakness of Arms/Hands: Both  Emotional Assessment Alcohol / Substance Use: Not Applicable  Admission diagnosis:  Hypoxia [R09.02] Acute on chronic respiratory failure with hypoxia (HCC) [J96.21] Patient Active Problem List   Diagnosis Date Noted   Anemia due to antineoplastic chemotherapy    Hypoalbuminemia    Pyuria    Lactic acidosis    Acute encephalopathy    Demand ischemia (HCC)    Volume overload    Pneumothorax    HTN (hypertension)    Frequent falls    Pressure injury of thigh, stage 1 07/22/2021   Low blood pressure 07/22/2021   Malignant neoplasm metastatic to brain ([place]) 06/19/2021   Dehydration 06/18/2021   Pain in right knee 06/07/2021   Anemia 06/02/2021   Cervical radiculitis 05/30/2021   Right knee pain 05/30/2021   RLQ abdominal pain 05/30/2021   Coronary artery calcification seen on CT scan 12/18/2020   Encounter for antineoplastic immunotherapy 09/26/2020   Port-A-Cath in place 09/18/2020   Small cell carcinoma of lower lobe of left lung ([place]) 08/31/2020   Encounter for antineoplastic chemotherapy 08/31/2020   Frequent nosebleeds 08/17/2020   Right lumbar radiculopathy 12/17/2019   Emphysema lung ([place]) 03/03/2019   Aortic atherosclerosis ([place]) 03/03/2019   Healthcare maintenance 02/01/2019   Right ankle pain 10/06/2018   Sinusitis 08/28/2018   Costochondritis 04/10/2018   Leg pain 03/25/2018   Fall 08/13/2017    Class: History of  Night sweats 07/13/2017   Dysuria 07/08/2017   Psoriasis 02/05/2017   Encounter for well adult exam with abnormal findings 02/05/2017    Chest pain 02/05/2017   Back pain 02/05/2017   Acute on chronic respiratory failure with hypoxia ([place]) 08/06/2016   Hypothyroidism 08/06/2016   Hyperglycemia 08/06/2016   Allergic rhinitis 07/16/2016   Chest wall mass 07/16/2016   Hyperlipidemia 07/12/2016   SVT (supraventricular tachycardia) ([place]) 07/12/2016   Vitamin D deficiency 07/12/2016   Depression 07/12/2016   Chronic respiratory failure ([place]) 03/24/2015   Interstitial lung disease ([place]) 08/21/2007   OSA (obstructive sleep apnea) 06/26/2007   PCP:  Biagio Borg, MD Pharmacy:   [facility] ([place]), [place] - [address] [place] [address] Phone: [phone] Fax: [facility] [place], [place] [address] Phone: [phone] Fax: [phone]  Readmission Risk Interventions    09/25/21   10:25 AM  Readmission Risk Prevention Plan  Transportation Screening Complete  Medication Review (RN Care Manager) Complete  HRI or Home Care Consult Complete  SW Recovery Care/Counseling Consult Complete  Palliative Care Screening Complete  [place] Not Applicable

## 2021-10-06 NOTE — IPAL (Signed)
  Interdisciplinary Goals of Care Family Meeting   Date carried out: September 18, 2021  Location of the meeting: Bedside  Member's involved: Nurse Practitioner, Bedside Registered Nurse, and Family Member or next of kin  Durable Power of Attorney or acting medical decision maker: Grayland Ormond - son     Discussion: We discussed goals of care for [employer] .  Discussed decline in last 24 hours. Conveyed my concern that Maanya is declaring herself. Grayland Ormond states he does not want to prolong suffering. We discuss transitioning to comfort focussed care at EOL and he agrees this is the most appropriate course for his mother at this time.   Will order for comfort care. Liberalize visitation. Dc interventions not aimed at comfort. PCCM will be available as needed.   Code status: Full DNR  Disposition: In-patient comfort care  Time spent for the meeting: 15 min     Cristal Generous, NP  09-18-21, 10:38 AM

## 2021-10-06 NOTE — Progress Notes (Signed)
Daily Progress Note   Patient Name: Dawn Gomez       Date: 10/10/21 DOB: 08-15-1935  Age: 86 y.o. MRN#: [id number] Attending Physician: Modena Jansky, MD Primary Care Physician: Biagio Borg, MD Admit Date: 09/20/2021  Reason for Consultation/Follow-up: Establishing goals of care  Subjective: I saw and examined Ms. Lye.  She was awake and lying in bed but remains very weak.  States she is too tired to talk about everything going on and referred me to her son.  I spoke at the bedside with son, Grayland Ormond, and patient's sister.  Grayland Ormond stated again that he understands that Ms. Kwiatek is not going to improve and we are approaching end of life.  They do not want her to suffer, but at this point she continues to report not feeling short of breath or being in pain, and she is able to interact with family.  If this were to change and she were to become uncomfortable or she reaches a point where she is no longer meaningfully awakening, Grayland Ormond reports he would be in agreement with transition to full comfort care.  At this point, however, he expressed desire to continue current course so family can continue to spend time with her.  We discussed concern about acute decompensation, potential for bleb rupture, and potential for chest tube.  He expressed understanding that no procedures or mechanical ventilation are going to fix her underlying issue, and he would be in agreement with plan to transition to focus on comfort rather than place chest tube if something acute were to occur.  Length of Stay: 3  Current Medications: Scheduled Meds:   chlorhexidine  15 mL Mouth Rinse BID   Chlorhexidine Gluconate Cloth  6 each Topical Daily   enoxaparin (LOVENOX) injection  40 mg Subcutaneous Q24H   mouth  rinse  15 mL Mouth Rinse q12n4p   sodium chloride flush  3 mL Intravenous Q12H    Continuous Infusions:  ceFEPime (MAXIPIME) IV Stopped (09/09/21 2059)   dexmedetomidine (PRECEDEX) IV infusion 0.4 mcg/kg/hr (10-Oct-2021 0700)   potassium chloride      PRN Meds: acetaminophen **OR** acetaminophen, fentaNYL (SUBLIMAZE) injection, haloperidol lactate, ipratropium-albuterol, midazolam, ondansetron **OR** ondansetron (ZOFRAN) IV, sodium chloride flush  Physical Exam         General: Sleepy but arousable  Heart:  Regular rate and rhythm. No murmur appreciated. Lungs: Decreased air movement Ext: No significant edema Skin: Warm and dry  Vital Signs: BP (!) 93/40   Pulse (!) 48   Temp 98.2 F (36.8 C) (Axillary)   Resp 17   Ht 4\' 10"  (1.473 m)   Wt 67.7 kg   SpO2 98%   BMI 31.19 kg/m  SpO2: SpO2: 98 % O2 Device: O2 Device: Venturi Mask O2 Flow Rate: O2 Flow Rate (L/min): 9 L/min  Intake/output summary:  Intake/Output Summary (Last 24 hours) at October 05, 2021 0813 Last data filed at 2021/10/05 0700 Gross per 24 hour  Intake 1044.22 ml  Output 130 ml  Net 914.22 ml   LBM: Last BM Date : 09/16/2021 Baseline Weight: Weight: 71.1 kg Most recent weight: Weight: 67.7 kg       Palliative Assessment/Data:    Flowsheet Rows    Flowsheet Row Most Recent Value  Intake Tab   Referral Department Hospitalist  Unit at Time of Referral ICU  Palliative Care Primary Diagnosis Cancer  Date Notified 09/08/21  Palliative Care Type New Palliative care  Reason for referral Clarify Goals of Care  Date of Admission 09/14/2021  Date first seen by Palliative Care 09/08/21  # of days Palliative referral response time 0 Day(s)  # of days IP prior to Palliative referral 1  Clinical Assessment   Palliative Performance Scale Score 20%  Psychosocial & Spiritual Assessment   Palliative Care Outcomes   Patient/Family meeting held? Yes  Who was at the meeting? Son, brother       Patient Active Problem  List   Diagnosis Date Noted   Allergic rhinitis 07/16/2016   Chest wall mass 07/16/2016   Anemia due to antineoplastic chemotherapy    Hypoalbuminemia    Pyuria    Lactic acidosis    Acute encephalopathy    Demand ischemia (HCC)    Volume overload    Pneumothorax    HTN (hypertension)    Frequent falls    Pressure injury of thigh, stage 1 07/22/2021   Low blood pressure 07/22/2021   Malignant neoplasm metastatic to brain ([place]) 06/19/2021   Dehydration 06/18/2021   Pain in right knee 06/07/2021   Anemia 06/02/2021   Cervical radiculitis 05/30/2021   Right knee pain 05/30/2021   RLQ abdominal pain 05/30/2021   Coronary artery calcification seen on CT scan 12/18/2020   Encounter for antineoplastic immunotherapy 09/26/2020   Port-A-Cath in place 09/18/2020   Small cell carcinoma of lower lobe of left lung ([place]) 08/31/2020   Encounter for antineoplastic chemotherapy 08/31/2020   Frequent nosebleeds 08/17/2020   Right lumbar radiculopathy 12/17/2019   Emphysema lung ([place]) 03/03/2019   Aortic atherosclerosis ([place]) 03/03/2019   Healthcare maintenance 02/01/2019   Right ankle pain 10/06/2018   Sinusitis 08/28/2018   Costochondritis 04/10/2018   Leg pain 03/25/2018   Fall 08/13/2017   Night sweats 07/13/2017   Dysuria 07/08/2017   Psoriasis 02/05/2017   Encounter for well adult exam with abnormal findings 02/05/2017   Chest pain 02/05/2017   Back pain 02/05/2017   Acute on chronic respiratory failure with hypoxia ([place]) 08/06/2016   Hypothyroidism 08/06/2016   Hyperglycemia 08/06/2016   Hyperlipidemia 07/12/2016   SVT (supraventricular tachycardia) ([place]) 07/12/2016   Vitamin D deficiency 07/12/2016   Depression 07/12/2016   Chronic respiratory failure ([place]) 03/24/2015   Interstitial lung disease ([place]) 08/21/2007   OSA (obstructive sleep apnea) 06/26/2007    Palliative Care Assessment & Plan   Patient Profile: 86  y.o. female  with past medical history of Small cell lung  cancer with mets to liver and brain currently on second line chemotherapy and status post whole brain radiation, depression, memory impairment, hyperlipidemia, hypothyroidism, chronic hypoxic respiratory failure on 4 L O2, OSA, COP admitted on 09/12/2021 after being found down and in respiratory distress.  Work-up revealed small right pneumothorax, large bleb, and acute on chronic hypoxic respiratory failure.  Palliative consulted for goals of care.  Recommendations/Plan: DNR/DNI Continue current interventions.  Son understands that Ms. Friberg is not likely to improve regardless of interventions moving forward.  At this point, however, she is awake, not complaining of pain, and not complaining of shortness of breath.  He remains focused on family spending time with her while she is still awake and interactive. Reviewed escalating care to intervention such as BiPAP, chest tube, or mechanical ventilation are not going to fix her underlying issues.  He expressed understanding and would like to continue with current care at this time but is open to shift to comfort if she is uncomfortable or not able to interact with family. Palliative to continue to follow.  Code Status:    Code Status Orders  (From admission, onward)           Start     Ordered   09/08/21 0144  Do not attempt resuscitation (DNR)  Continuous       Question Answer Comment  In the event of cardiac or respiratory ARREST Do not call a "code blue"   In the event of cardiac or respiratory ARREST Do not perform Intubation, CPR, defibrillation or ACLS   In the event of cardiac or respiratory ARREST Use medication by any route, position, wound care, and other measures to relive pain and suffering. May use oxygen, suction and manual treatment of airway obstruction as needed for comfort.      09/08/21 0147           Code Status History     Date Active Date Inactive Code Status Order ID Comments User Context   08/06/2016 0212 08/10/2016  1939 Full Code [id number]  Karmen Bongo, MD Inpatient      Advance Directive Documentation    Flowsheet Row Most Recent Value  Type of Advance Directive Healthcare Power of Attorney  Pre-existing out of facility DNR order (yellow form or pink MOST form) --  "MOST" Form in Place? --       Prognosis:  Guarded  Discharge Planning: To Be Determined- High risk for hospital death  Care plan was discussed with son, Grayland Ormond, sister  Thank you for allowing the Palliative Medicine Team to assist in the care of this patient.   Time In: 1220 Time Out: 1310 Total Time 50 Prolonged Time Billed No   Lunetta Marina Domingo Cocking, MD  Please contact Palliative Medicine Team phone at [phone] for questions and concerns.

## 2021-10-06 NOTE — Final Progress Note (Signed)
    OVERNIGHT PROGRESS REPORT  Notified by RN that patient has expired at 2225   Patient was DNR/comfort care  2 RN verified.  Family was immediately available to RN.     Gershon Cull MSNA ACNPC-AG Acute Care Nurse Practitioner [place]

## 2021-10-06 DEATH — deceased

## 2021-10-15 ENCOUNTER — Ambulatory Visit: Payer: Self-pay | Admitting: Radiation Oncology

## 2021-11-30 ENCOUNTER — Ambulatory Visit: Payer: Medicare HMO | Admitting: Internal Medicine

## 2022-06-23 IMAGING — CT CT ABD-PELV W/ CM
3 of 5 series · 13 of 36 positions shown, 16 images · IV contrast (omnipaque)
Comparison: 12/05/2020

CLINICAL DATA: Metastatic left lower lobe small-cell lung cancer
restaging, chemotherapy complete

EXAM:
CT CHEST, ABDOMEN, AND PELVIS WITH CONTRAST
TECHNIQUE: Multidetector CT imaging of the chest, abdomen and pelvis was
performed following the standard protocol during bolus
administration of intravenous contrast.
CONTRAST:  80mL OMNIPAQUE IOHEXOL 350 MG/ML SOLN, additional oral
enteric contrast

[Series 2: cap with · axial · 0.84mm/px · z∈[-561,-101]mm · 9 of 116 slices shown, 12 images]
[im 12/116  mediastinal]
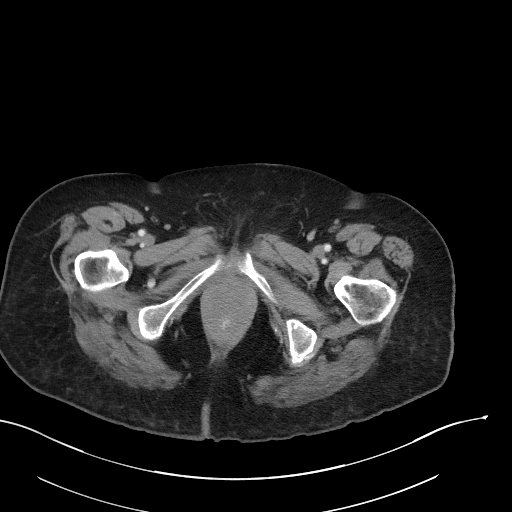
[im 12/116  lung]
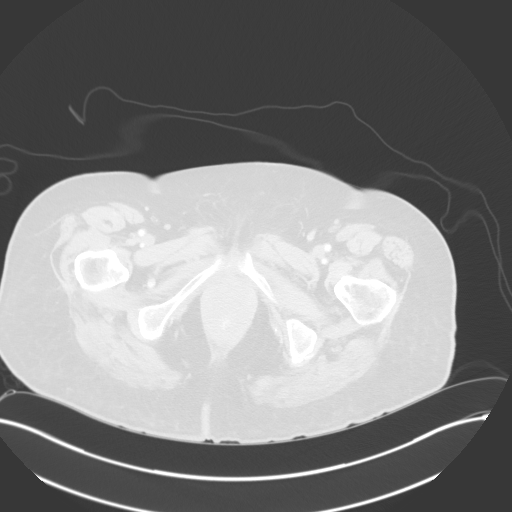
[im 24/116  lung]
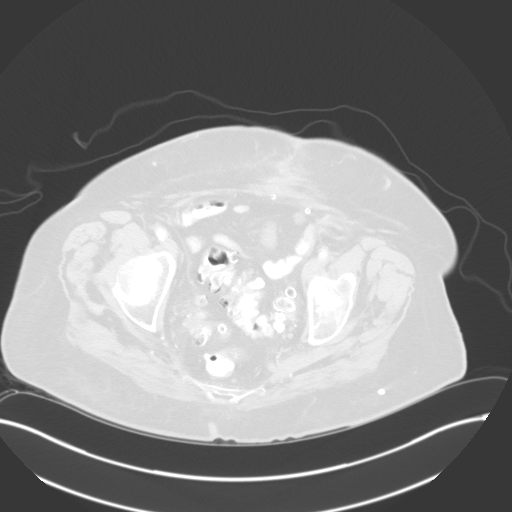
[im 35/116  lung]
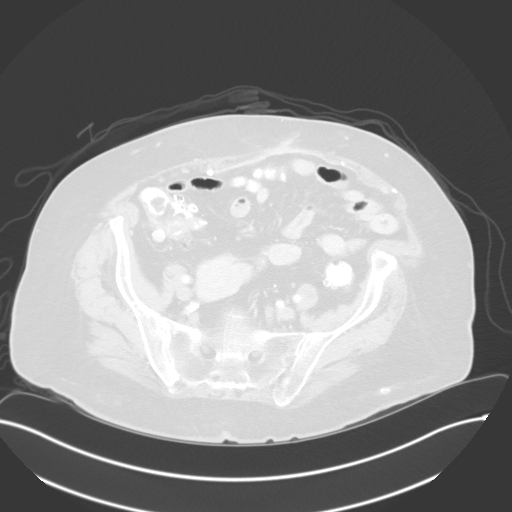
[im 47/116  lung]
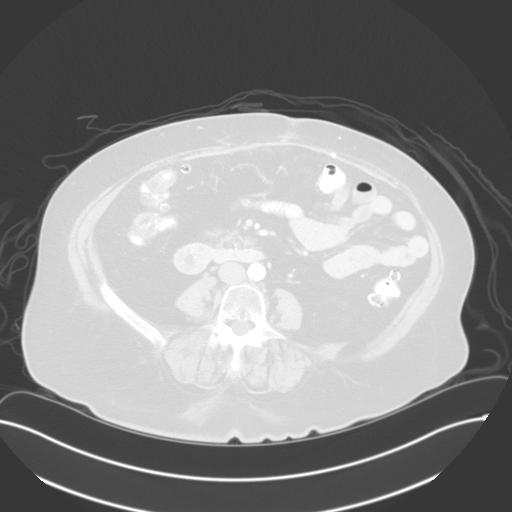
[im 58/116  mediastinal]
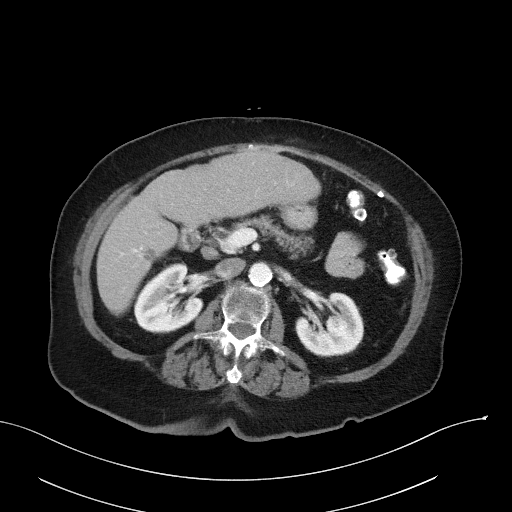
[im 58/116  lung]
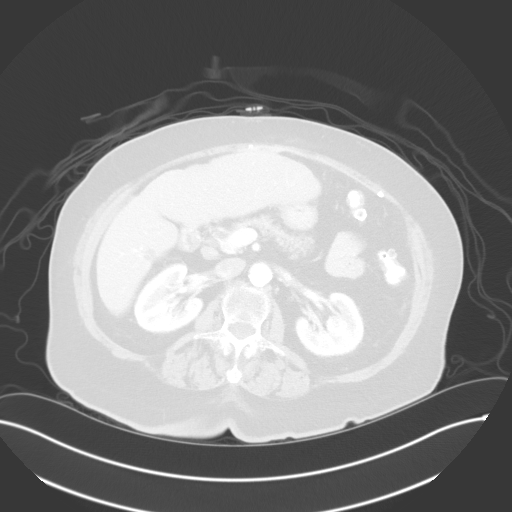
[im 70/116  lung]
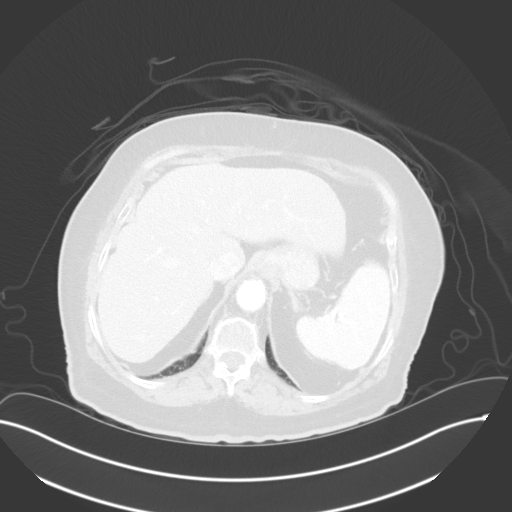
[im 81/116  lung]
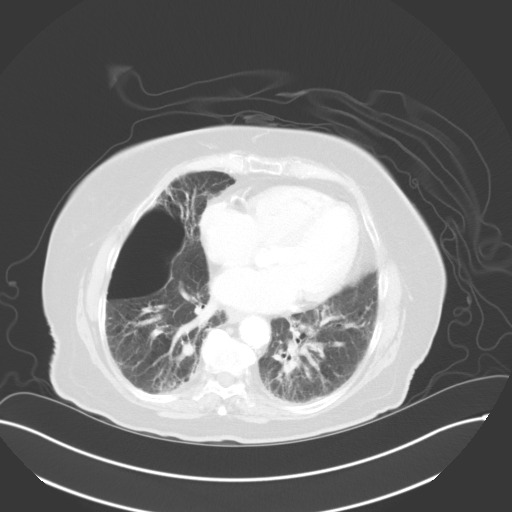
[im 93/116  lung]
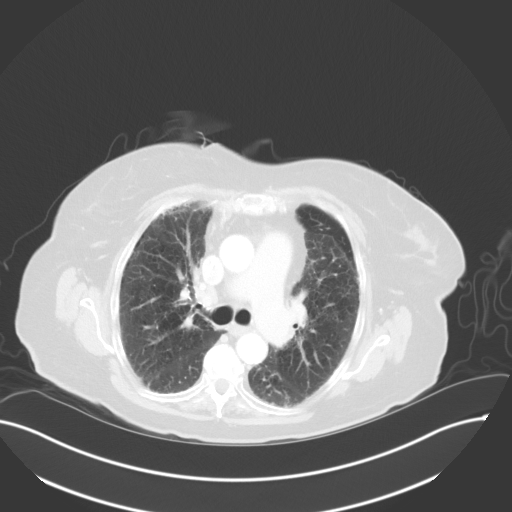
[im 104/116  mediastinal]
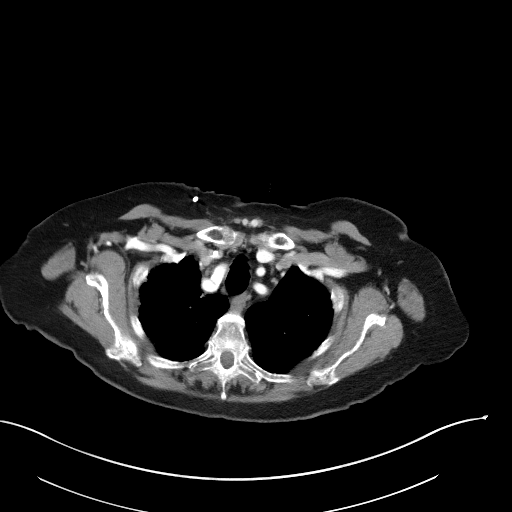
[im 104/116  lung]
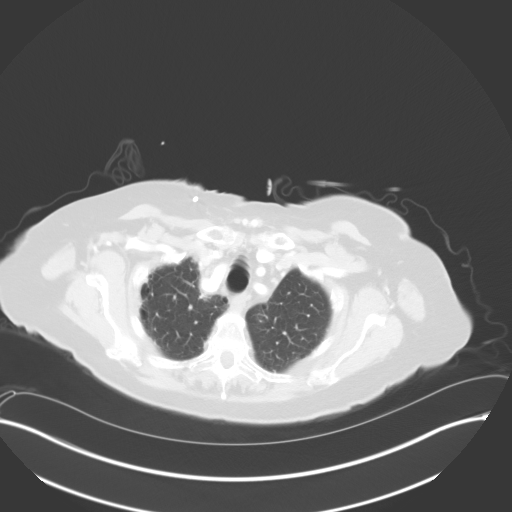

[Series 4: lung · axial · 0.84mm/px · 1 of 137 slices shown]
[im 12/137  lung]
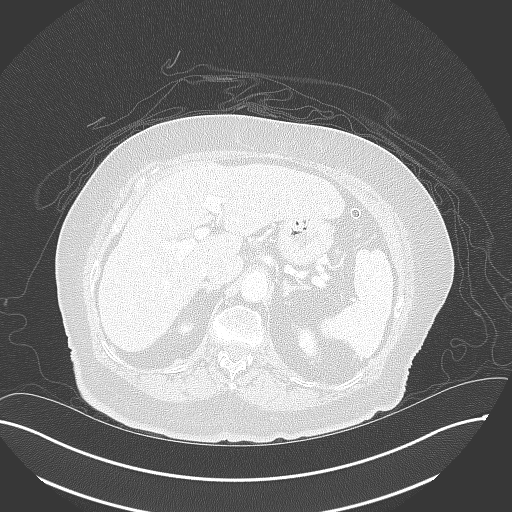

[Series 5: coronals · coronal · 0.82mm/px · 3 of 159 slices shown]
[im 32/159  lung]
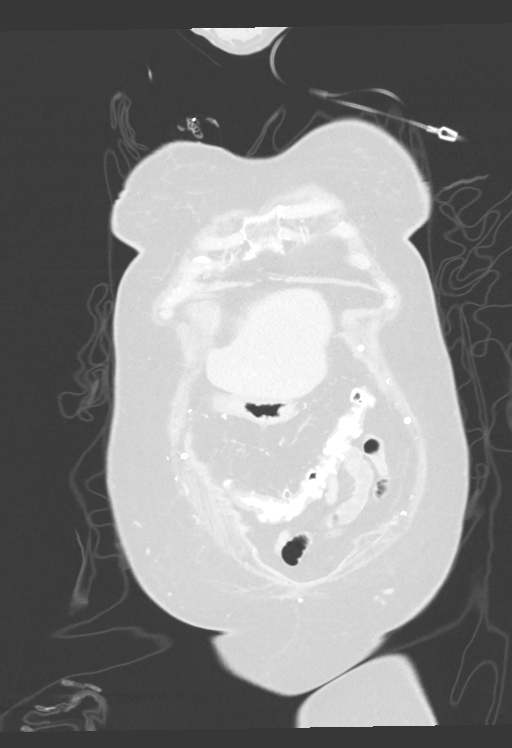
[im 64/159  lung]
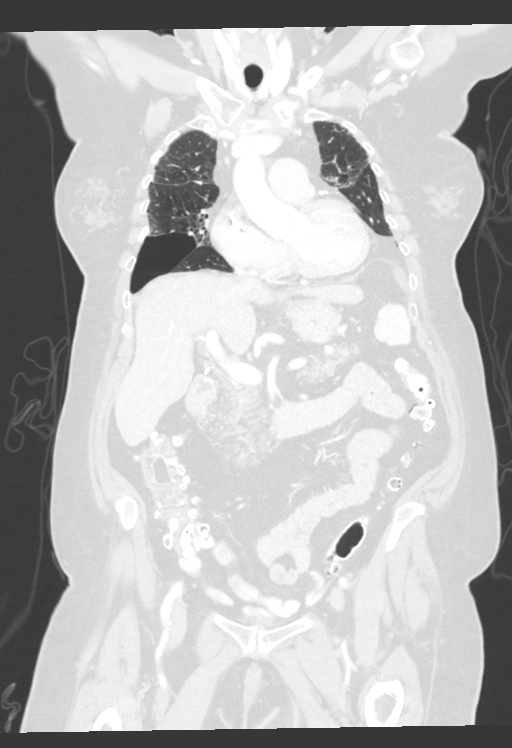
[im 95/159  lung]
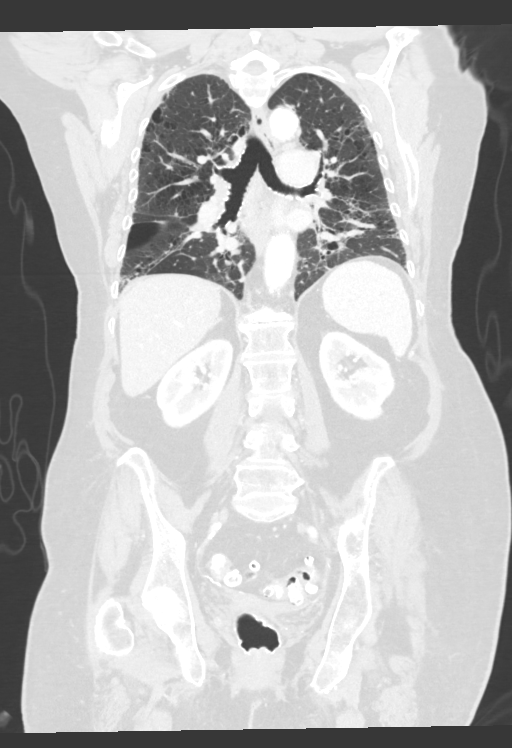

[13 of 36 positions shown; findings below may reference images not displayed]

FINDINGS: CT CHEST FINDINGS

Cardiovascular: Right chest port catheter. Aortic atherosclerosis.
Normal heart size. Three-vessel coronary artery calcifications. No
pericardial effusion.

Mediastinum/Nodes: No enlarged mediastinal, hilar, or axillary lymph
nodes. Thyroid gland, trachea, and esophagus demonstrate no
significant findings.

Lungs/Pleura: Mild-to-moderate centrilobular and paraseptal
emphysema. Continued interval decrease in size of a subpleural mass
of the posterior superior segment left lower lobe, measuring 1.4 x
1.0 cm, previously 2.1 x 1.3 cm (series 506, image 77). Large bulla
or pneumatocele of the right lower lobe unchanged (series 506, image
91). No pleural effusion or pneumothorax.

Musculoskeletal: No chest wall mass.

CT ABDOMEN PELVIS FINDINGS

Hepatobiliary: Coarse, nodular contour of the liver. Multiple
low-attenuation lesions of the liver are unchanged, and an index
lesion of the anterior right lobe, hepatic segment VI, measuring
x 1.2 cm (series 504, image 56), an index lesion of the peripheral
liver dome, hepatic segment VII, measuring 0.6 cm (series 504, image
44). Status post cholecystectomy. No biliary ductal dilatation.

Pancreas: Unremarkable. No pancreatic ductal dilatation or
surrounding inflammatory changes.

Spleen: Normal in size without significant abnormality.

Adrenals/Urinary Tract: Adrenal glands are unremarkable. Kidneys are
normal, without renal calculi, solid lesion, or hydronephrosis.
Bladder is unremarkable.

Stomach/Bowel: Stomach is within normal limits. Appendix is not
clearly visualized. No evidence of bowel wall thickening,
distention, or inflammatory changes. Pancolonic diverticulosis.

Vascular/Lymphatic: Aortic atherosclerosis. No enlarged abdominal or
pelvic lymph nodes.

Reproductive: No mass or other abnormality.

Other: Ventral hernia mesh.  No abdominopelvic ascites.

Musculoskeletal: Unchanged sclerotic osseous metastases of the
pelvis and lumbar spine.
IMPRESSION: 1. Continued interval decrease in size of a subpleural mass of the
posterior superior segment left lower lobe, measuring 1.4 x 1.0 cm,
previously 2.1 x 1.3 cm. Findings are consistent with treatment
response.
2. Multiple low-attenuation metastatic liver lesions are unchanged.
3. Unchanged sclerotic osseous metastases of the pelvis and lumbar
spine.
4. No evidence of new metastatic disease in the chest, abdomen, or
pelvis.
5. Emphysema.
6. Coronary artery disease.

Aortic Atherosclerosis (TXM10-NIO.O) and Emphysema (TXM10-R3F.3).

## 2022-06-23 IMAGING — CT CT CHEST W/ CM
2 of 4 series · 13 of 36 positions shown, 16 images · IV contrast (APPLIED)
Comparison: 12/05/2020

CLINICAL DATA: Metastatic left lower lobe small-cell lung cancer
restaging, chemotherapy complete

EXAM:
CT CHEST, ABDOMEN, AND PELVIS WITH CONTRAST
TECHNIQUE: Multidetector CT imaging of the chest, abdomen and pelvis was
performed following the standard protocol during bolus
administration of intravenous contrast.
CONTRAST:  80mL OMNIPAQUE IOHEXOL 350 MG/ML SOLN, additional oral
enteric contrast

[Series 504: cap with · axial · 0.84mm/px · z∈[-571,-91]mm · 10 of 116 slices shown, 13 images]
[im 10/116  mediastinal]
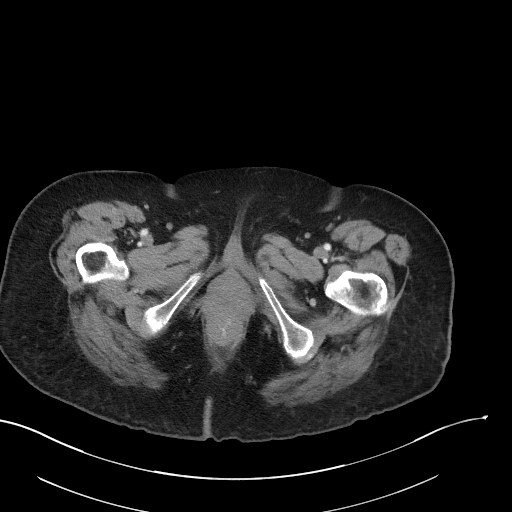
[im 10/116  lung]
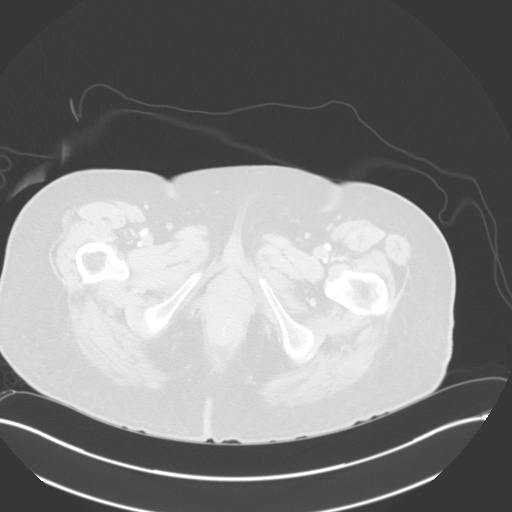
[im 20/116  lung]
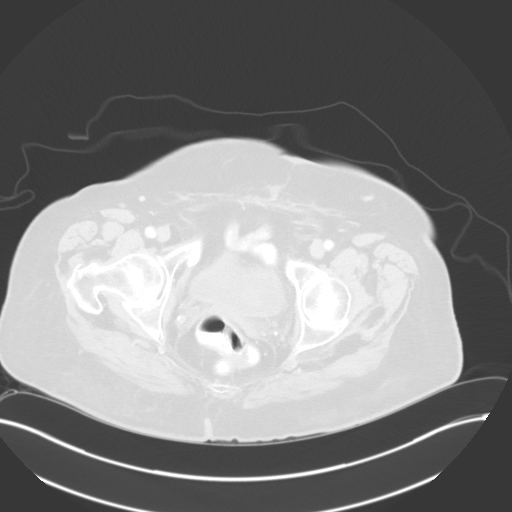
[im 29/116  lung]
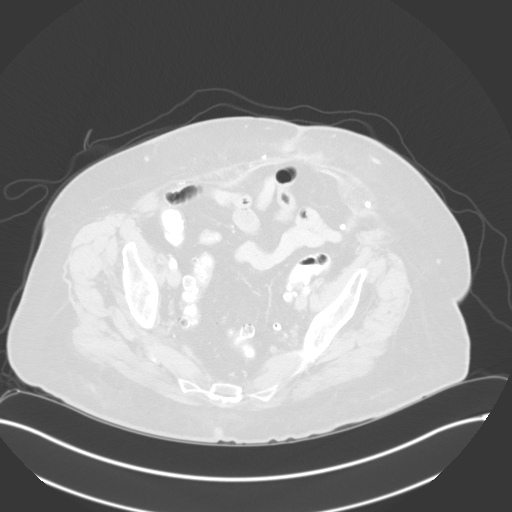
[im 39/116  lung]
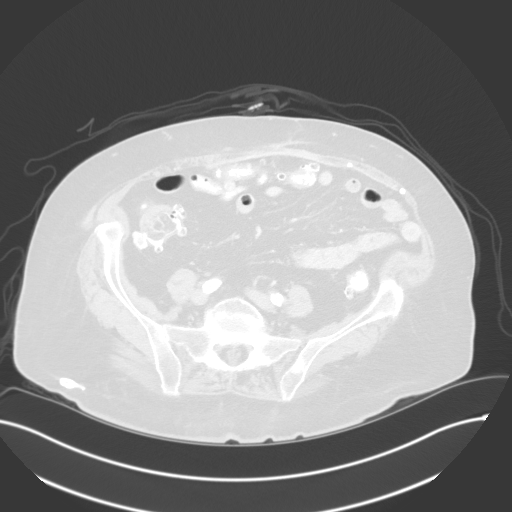
[im 48/116  mediastinal]
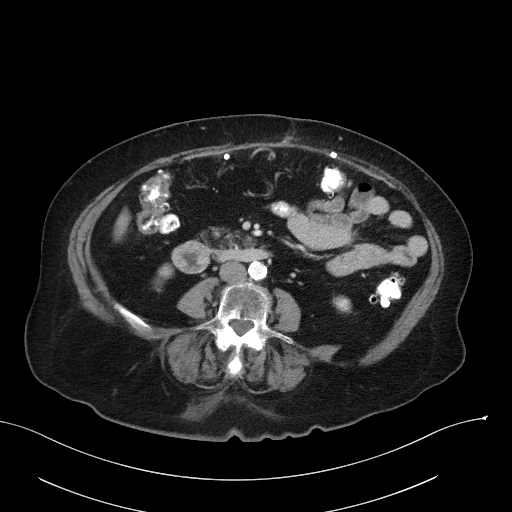
[im 48/116  lung]
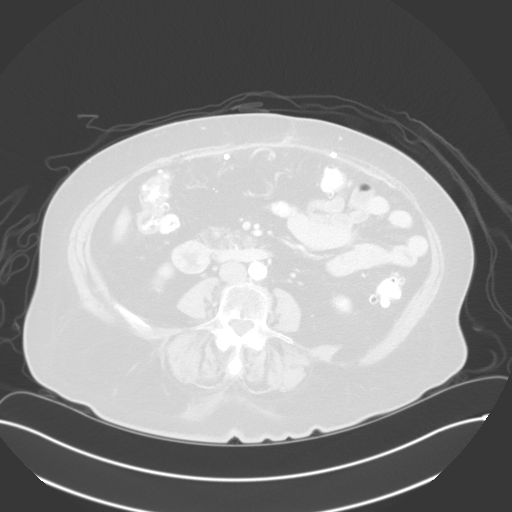
[im 68/116  lung]
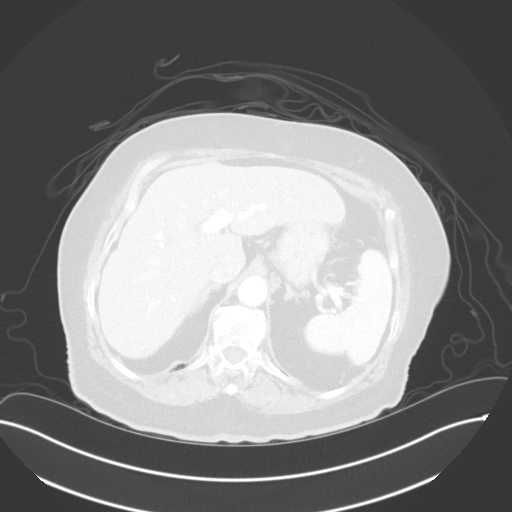
[im 77/116  lung]
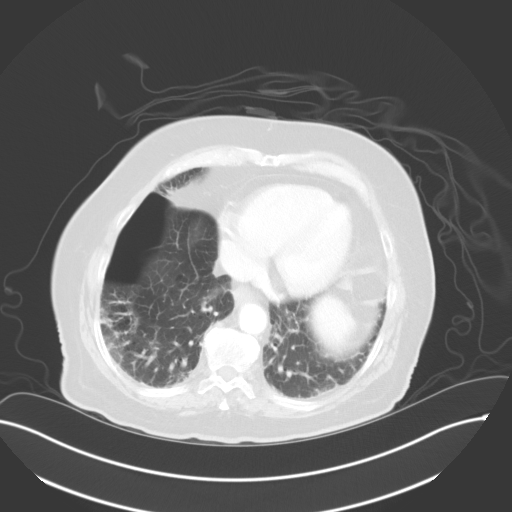
[im 87/116  lung]
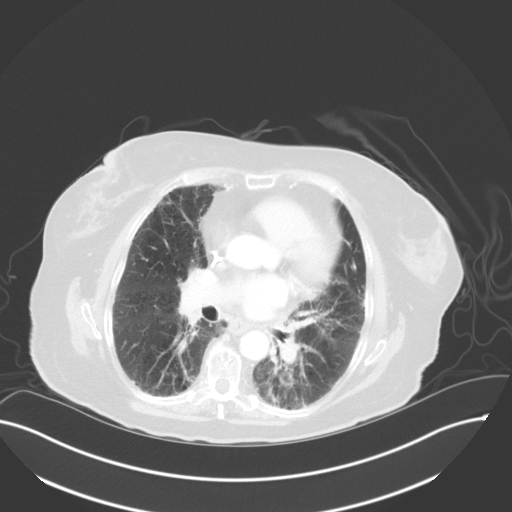
[im 96/116  mediastinal]
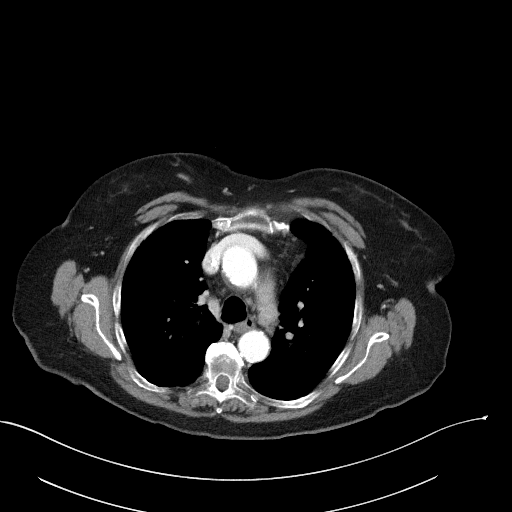
[im 96/116  lung]
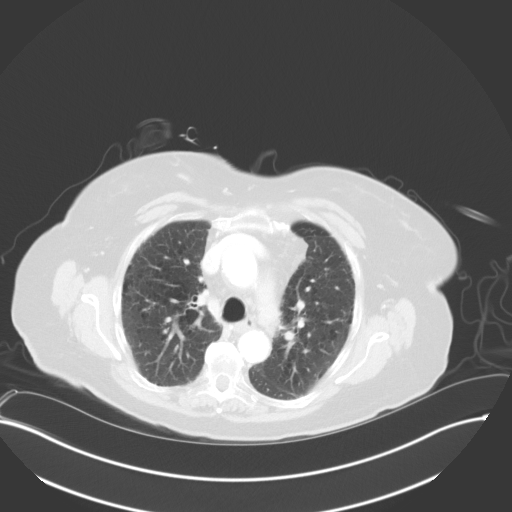
[im 106/116  lung]
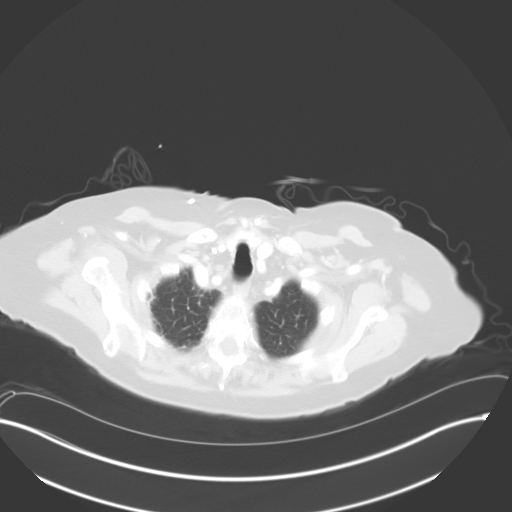

[Series 507: coronals · coronal · 0.82mm/px · 3 of 159 slices shown]
[im 32/159  lung]
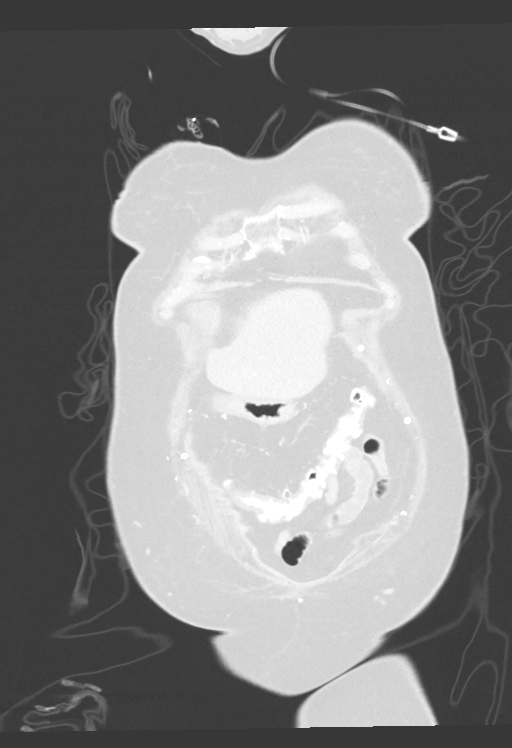
[im 64/159  lung]
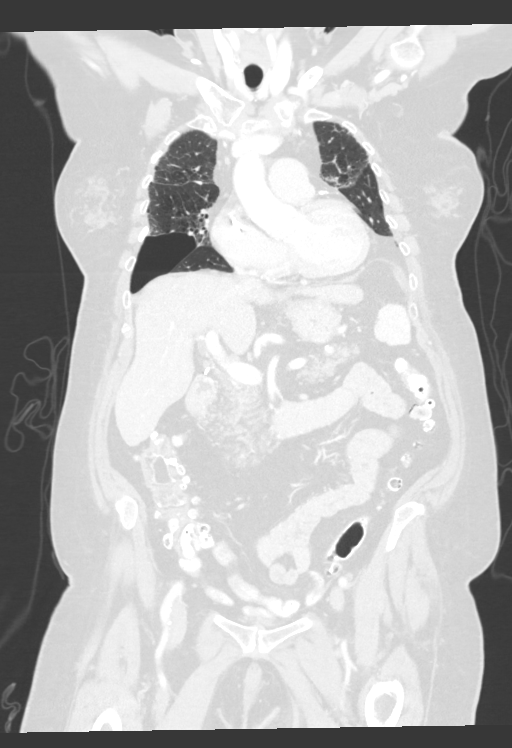
[im 95/159  lung]
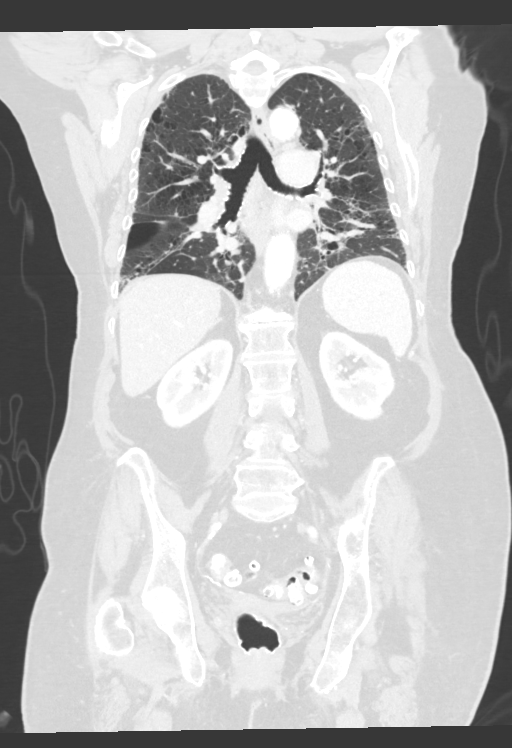

[13 of 36 positions shown; findings below may reference images not displayed]

FINDINGS: CT CHEST FINDINGS

Cardiovascular: Right chest port catheter. Aortic atherosclerosis.
Normal heart size. Three-vessel coronary artery calcifications. No
pericardial effusion.

Mediastinum/Nodes: No enlarged mediastinal, hilar, or axillary lymph
nodes. Thyroid gland, trachea, and esophagus demonstrate no
significant findings.

Lungs/Pleura: Mild-to-moderate centrilobular and paraseptal
emphysema. Continued interval decrease in size of a subpleural mass
of the posterior superior segment left lower lobe, measuring 1.4 x
1.0 cm, previously 2.1 x 1.3 cm (series 506, image 77). Large bulla
or pneumatocele of the right lower lobe unchanged (series 506, image
91). No pleural effusion or pneumothorax.

Musculoskeletal: No chest wall mass.

CT ABDOMEN PELVIS FINDINGS

Hepatobiliary: Coarse, nodular contour of the liver. Multiple
low-attenuation lesions of the liver are unchanged, and an index
lesion of the anterior right lobe, hepatic segment VI, measuring
x 1.2 cm (series 504, image 56), an index lesion of the peripheral
liver dome, hepatic segment VII, measuring 0.6 cm (series 504, image
44). Status post cholecystectomy. No biliary ductal dilatation.

Pancreas: Unremarkable. No pancreatic ductal dilatation or
surrounding inflammatory changes.

Spleen: Normal in size without significant abnormality.

Adrenals/Urinary Tract: Adrenal glands are unremarkable. Kidneys are
normal, without renal calculi, solid lesion, or hydronephrosis.
Bladder is unremarkable.

Stomach/Bowel: Stomach is within normal limits. Appendix is not
clearly visualized. No evidence of bowel wall thickening,
distention, or inflammatory changes. Pancolonic diverticulosis.

Vascular/Lymphatic: Aortic atherosclerosis. No enlarged abdominal or
pelvic lymph nodes.

Reproductive: No mass or other abnormality.

Other: Ventral hernia mesh.  No abdominopelvic ascites.

Musculoskeletal: Unchanged sclerotic osseous metastases of the
pelvis and lumbar spine.
IMPRESSION: 1. Continued interval decrease in size of a subpleural mass of the
posterior superior segment left lower lobe, measuring 1.4 x 1.0 cm,
previously 2.1 x 1.3 cm. Findings are consistent with treatment
response.
2. Multiple low-attenuation metastatic liver lesions are unchanged.
3. Unchanged sclerotic osseous metastases of the pelvis and lumbar
spine.
4. No evidence of new metastatic disease in the chest, abdomen, or
pelvis.
5. Emphysema.
6. Coronary artery disease.

Aortic Atherosclerosis (TXM10-NIO.O) and Emphysema (TXM10-R3F.3).

## 2023-02-26 NOTE — Telephone Encounter (Signed)
Telephone call
